# Patient Record
Sex: Female | Born: 1960 | Race: White | Hispanic: No | Smoking: Former smoker
Health system: Southern US, Community
[De-identification: ages and names within clinical notes are randomized; demographics above are authoritative.]

## PROBLEM LIST (undated history)

## (undated) DIAGNOSIS — I313 Pericardial effusion (noninflammatory): Secondary | ICD-10-CM

## (undated) DIAGNOSIS — E0500 Thyrotoxicosis with diffuse goiter without thyrotoxic crisis or storm: Secondary | ICD-10-CM

## (undated) DIAGNOSIS — I3131 Malignant pericardial effusion in diseases classified elsewhere: Secondary | ICD-10-CM

## (undated) DIAGNOSIS — C3490 Malignant neoplasm of unspecified part of unspecified bronchus or lung: Secondary | ICD-10-CM

## (undated) DIAGNOSIS — I480 Paroxysmal atrial fibrillation: Secondary | ICD-10-CM

## (undated) DIAGNOSIS — C801 Malignant (primary) neoplasm, unspecified: Secondary | ICD-10-CM

## (undated) HISTORY — PX: TUBAL LIGATION: SHX77

## (undated) HISTORY — DX: Malignant neoplasm of unspecified part of unspecified bronchus or lung: C34.90

## (undated) HISTORY — DX: Pericardial effusion (noninflammatory): I31.3

## (undated) HISTORY — DX: Malignant (primary) neoplasm, unspecified: C80.1

## (undated) HISTORY — DX: Malignant pericardial effusion in diseases classified elsewhere: I31.31

## (undated) HISTORY — DX: Paroxysmal atrial fibrillation: I48.0

---

## 2013-12-01 ENCOUNTER — Emergency Department: Payer: Self-pay | Admitting: Emergency Medicine

## 2013-12-01 LAB — CBC
HCT: 46.8 % (ref 35.0–47.0)
HGB: 15.9 g/dL (ref 12.0–16.0)
MCH: 29.6 pg (ref 26.0–34.0)
MCHC: 33.9 g/dL (ref 32.0–36.0)
MCV: 87 fL (ref 80–100)
Platelet: 236 10*3/uL (ref 150–440)
RBC: 5.36 10*6/uL — ABNORMAL HIGH (ref 3.80–5.20)
RDW: 14.7 % — ABNORMAL HIGH (ref 11.5–14.5)
WBC: 10.3 10*3/uL (ref 3.6–11.0)

## 2013-12-01 LAB — DRUG SCREEN, URINE
AMPHETAMINES, UR SCREEN: NEGATIVE (ref ?–1000)
BENZODIAZEPINE, UR SCRN: NEGATIVE (ref ?–200)
Barbiturates, Ur Screen: NEGATIVE (ref ?–200)
CANNABINOID 50 NG, UR ~~LOC~~: POSITIVE (ref ?–50)
COCAINE METABOLITE, UR ~~LOC~~: NEGATIVE (ref ?–300)
MDMA (ECSTASY) UR SCREEN: NEGATIVE (ref ?–500)
Methadone, Ur Screen: NEGATIVE (ref ?–300)
Opiate, Ur Screen: NEGATIVE (ref ?–300)
Phencyclidine (PCP) Ur S: NEGATIVE (ref ?–25)
Tricyclic, Ur Screen: NEGATIVE (ref ?–1000)

## 2013-12-01 LAB — URINALYSIS, COMPLETE
Bacteria: NONE SEEN
Bilirubin,UR: NEGATIVE
Blood: NEGATIVE
Glucose,UR: NEGATIVE mg/dL (ref 0–75)
Ketone: NEGATIVE
Nitrite: NEGATIVE
PH: 6 (ref 4.5–8.0)
PROTEIN: NEGATIVE
RBC,UR: NONE SEEN /HPF (ref 0–5)
Specific Gravity: 1.005 (ref 1.003–1.030)
Squamous Epithelial: 1
WBC UR: <1 /HPF (ref 0–5)

## 2013-12-01 LAB — ACETAMINOPHEN LEVEL

## 2013-12-01 LAB — COMPREHENSIVE METABOLIC PANEL
ALT: 36 U/L (ref 12–78)
AST: 35 U/L (ref 15–37)
Albumin: 3.7 g/dL (ref 3.4–5.0)
Alkaline Phosphatase: 327 U/L — ABNORMAL HIGH
Anion Gap: 4 — ABNORMAL LOW (ref 7–16)
BILIRUBIN TOTAL: 0.4 mg/dL (ref 0.2–1.0)
BUN: 8 mg/dL (ref 7–18)
Calcium, Total: 8.8 mg/dL (ref 8.5–10.1)
Chloride: 108 mmol/L — ABNORMAL HIGH (ref 98–107)
Co2: 25 mmol/L (ref 21–32)
Creatinine: 0.43 mg/dL — ABNORMAL LOW (ref 0.60–1.30)
Glucose: 117 mg/dL — ABNORMAL HIGH (ref 65–99)
Osmolality: 273 (ref 275–301)
POTASSIUM: 3.7 mmol/L (ref 3.5–5.1)
Sodium: 137 mmol/L (ref 136–145)
Total Protein: 7.8 g/dL (ref 6.4–8.2)

## 2013-12-01 LAB — ETHANOL: Ethanol: <3 mg/dL

## 2013-12-01 LAB — SALICYLATE LEVEL: SALICYLATES, SERUM: 1.7 mg/dL

## 2013-12-14 ENCOUNTER — Emergency Department: Payer: Self-pay | Admitting: Emergency Medicine

## 2014-03-22 DIAGNOSIS — I10 Essential (primary) hypertension: Secondary | ICD-10-CM | POA: Insufficient documentation
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
## 2014-12-31 NOTE — Consult Note (Signed)
Brief Consult Note: Diagnosis: adjustment disorder with anxiety.   Patient was seen by consultant.   Consult note dictated.   Orders entered.   Discussed with Attending MD.   Comments: Psychiatry: PAtient seen. Chart reviewed. Patient presents with overwhelming anxiety but no suicidal ideation or psychosis. Has medical problems and serious social stress. Not getting outpt treatment.   She has no ride tonight and also is having what sounds like a migraine. Ordered one-time zofran, motrin and restoril to ease discomfort and let her rest. Can be discharged in AM and referred to [facility].  Electronic Signatures: Gonzella Lex (MD)  (Signed 25-Mar-15 21:04)  Authored: Brief Consult Note   Last Updated: 25-Mar-15 21:04 by Gonzella Lex (MD)

## 2017-03-19 ENCOUNTER — Encounter: Payer: Self-pay | Admitting: Intensive Care

## 2017-03-19 ENCOUNTER — Emergency Department: Payer: Self-pay

## 2017-03-19 ENCOUNTER — Emergency Department
Admission: EM | Admit: 2017-03-19 | Discharge: 2017-03-19 | Disposition: A | Discharge status: Home / Self Care | Payer: Self-pay | Attending: Emergency Medicine | Admitting: Emergency Medicine

## 2017-03-19 DIAGNOSIS — A938 Other specified arthropod-borne viral fevers: Secondary | ICD-10-CM | POA: Insufficient documentation

## 2017-03-19 DIAGNOSIS — Z79899 Other long term (current) drug therapy: Secondary | ICD-10-CM | POA: Insufficient documentation

## 2017-03-19 DIAGNOSIS — F17210 Nicotine dependence, cigarettes, uncomplicated: Secondary | ICD-10-CM | POA: Insufficient documentation

## 2017-03-19 HISTORY — DX: Thyrotoxicosis with diffuse goiter without thyrotoxic crisis or storm: E05.00

## 2017-03-19 LAB — COMPREHENSIVE METABOLIC PANEL
ALBUMIN: 3.9 g/dL (ref 3.5–5.0)
ALK PHOS: 84 U/L (ref 38–126)
ALT: 25 U/L (ref 14–54)
AST: 36 U/L (ref 15–41)
Anion gap: 12 (ref 5–15)
BUN: 8 mg/dL (ref 6–20)
CALCIUM: 9.9 mg/dL (ref 8.9–10.3)
CO2: 23 mmol/L (ref 22–32)
Chloride: 104 mmol/L (ref 101–111)
Creatinine, Ser: 0.69 mg/dL (ref 0.44–1.00)
GFR calc Af Amer: >60 mL/min (ref >60)
GFR calc non Af Amer: >60 mL/min (ref >60)
Glucose, Bld: 142 mg/dL — ABNORMAL HIGH (ref 65–99)
Potassium: 3.7 mmol/L (ref 3.5–5.1)
Sodium: 139 mmol/L (ref 135–145)
TOTAL PROTEIN: 8 g/dL (ref 6.5–8.1)
Total Bilirubin: 0.8 mg/dL (ref 0.3–1.2)

## 2017-03-19 LAB — CBC WITH DIFFERENTIAL/PLATELET
BASOS ABS: 0.1 10*3/uL (ref 0–0.1)
Basophils Relative: 1 %
Eosinophils Absolute: 0.2 10*3/uL (ref 0–0.7)
Eosinophils Relative: 2 %
HCT: 45.3 % (ref 35.0–47.0)
HEMOGLOBIN: 15.4 g/dL (ref 12.0–16.0)
Lymphocytes Relative: 23 %
Lymphs Abs: 3.3 10*3/uL (ref 1.0–3.6)
MCH: 31.1 pg (ref 26.0–34.0)
MCHC: 33.9 g/dL (ref 32.0–36.0)
MCV: 91.5 fL (ref 80.0–100.0)
MONOS PCT: 8 %
Monocytes Absolute: 1.2 10*3/uL — ABNORMAL HIGH (ref 0.2–0.9)
NEUTROS PCT: 66 %
Neutro Abs: 9.8 10*3/uL — ABNORMAL HIGH (ref 1.4–6.5)
Platelets: 303 10*3/uL (ref 150–440)
RBC: 4.94 MIL/uL (ref 3.80–5.20)
RDW: 12.9 % (ref 11.5–14.5)
WBC: 14.7 10*3/uL — ABNORMAL HIGH (ref 3.6–11.0)

## 2017-03-19 LAB — URINALYSIS, COMPLETE (UACMP) WITH MICROSCOPIC
BACTERIA UA: NONE SEEN
BILIRUBIN URINE: NEGATIVE
GLUCOSE, UA: NEGATIVE mg/dL
HGB URINE DIPSTICK: NEGATIVE
KETONES UR: NEGATIVE mg/dL
NITRITE: NEGATIVE
PROTEIN: NEGATIVE mg/dL
Specific Gravity, Urine: 1.008 (ref 1.005–1.030)
pH: 6 (ref 5.0–8.0)

## 2017-03-19 LAB — POCT PREGNANCY, URINE: PREG TEST UR: NEGATIVE

## 2017-03-19 MED ORDER — IOPAMIDOL (ISOVUE-300) INJECTION 61%
30.0000 mL | Freq: Once | INTRAVENOUS | Status: AC | PRN
  Administered 2017-03-19: 30 mL via ORAL

## 2017-03-19 MED ORDER — DOXYCYCLINE HYCLATE 100 MG PO TABS
100.0000 mg | ORAL_TABLET | Freq: Once | ORAL | Status: AC
  Administered 2017-03-19: 100 mg via ORAL
  Filled 2017-03-19: qty 1

## 2017-03-19 MED ORDER — IOPAMIDOL (ISOVUE-300) INJECTION 61%
100.0000 mL | Freq: Once | INTRAVENOUS | Status: AC | PRN
  Administered 2017-03-19: 100 mL via INTRAVENOUS

## 2017-03-19 NOTE — ED Provider Notes (Addendum)
[facility] Emergency Department Provider Note   ____________________________________________   First MD Initiated Contact with Patient 03/19/17 1410     (approximate)  I have reviewed the triage vital signs and the nursing notes.   HISTORY  Chief Complaint Tick Removal    HPI SHYE DOTY is a 56 y.o. female who reports she had a tick that she pulled off herself on the right side about a week to week and half ago. She was doing okay but then about 4 days ago began having fevers headaches and muscle aches. Fever up to 102.3 maximum. She has also had left lower quadrant pain. She is having urinary frequency and urgency as well. She does not have a rash. Pain is moderate and achy in nature  Past Medical History:  Diagnosis Date  . Graves disease     There are no active problems to display for this patient.   History reviewed. No pertinent surgical history.  Prior to Admission medications   Medication Sig Start Date End Date Taking? Authorizing Provider  albuterol (PROVENTIL HFA;VENTOLIN HFA) 108 (90 Base) MCG/ACT inhaler Inhale 2 puffs into the lungs every 6 (six) hours as needed.    [provider]  Multiple Vitamin (MULTI-VITAMINS) TABS Take 1 tablet by mouth daily.    [provider]    Allergies Metoprolol and Tapazole [methimazole]  History reviewed. No pertinent family history.  Social History Social History  Substance Use Topics  . Smoking status: Current Every Day Smoker    Packs/day: 1.00    Types: Cigarettes  . Smokeless tobacco: Never Used  . Alcohol use No    Review of Systems  Constitutional:  fever/chills Eyes: No visual changes. ENT: No sore throat. Cardiovascular: Denies chest pain. Respiratory: Denies shortness of breath. Gastrointestinal: No abdominal pain.  No nausea, no vomiting.  No diarrhea.  No constipation. Genitourinary: Negative for dysuria. Musculoskeletal: Negative for back pain. Skin:  Negative for rash. Neurological: Negative for focal weakness or numbness.   ____________________________________________   PHYSICAL EXAM:  VITAL SIGNS: ED Triage Vitals  Enc Vitals Group     BP 03/19/17 1142 135/88     Pulse Rate 03/19/17 1142 (!) 104     Resp 03/19/17 1142 16     Temp 03/19/17 1142 98.1 F (36.7 C)     Temp Source 03/19/17 1142 Oral     SpO2 03/19/17 1142 96 %     Weight 03/19/17 1143 124 lb (56.2 kg)     Height 03/19/17 1143 5' 1.5" (1.562 m)     Head Circumference --      Peak Flow --      Pain Score --      Pain Loc --      Pain Edu? --      Excl. in [place]? --     Constitutional: Alert and oriented. Well appearing and in no acute distress. Eyes: Conjunctivae are normal.  Head: Atraumatic. Nose: No congestion/rhinnorhea. Mouth/Throat: Mucous membranes are moist.  Oropharynx non-erythematous. Neck: No stridor.  Cardiovascular: Normal rate, regular rhythm. Grossly normal heart sounds.  Good peripheral circulation. Respiratory: Normal respiratory effort.  No retractions. Lungs CTAB. Gastrointestinal: Soft and nontender. No distention. No abdominal bruits. No CVA tenderness. Musculoskeletal: No lower extremity tenderness nor edema.  No joint effusions. Neurologic:  Normal speech and language. No gross focal neurologic deficits are appreciated. No gait instability. Skin:  Skin is warm, dry and intact. No rash noted. Psychiatric: Mood and affect are  normal. Speech and behavior are normal.  ____________________________________________   LABS (all labs ordered are listed, but only abnormal results are displayed)  Labs Reviewed  CBC WITH DIFFERENTIAL/PLATELET - Abnormal; Notable for the following:       Result Value   WBC 14.7 (*)    Neutro Abs 9.8 (*)    Monocytes Absolute 1.2 (*)    All other components within normal limits  COMPREHENSIVE METABOLIC PANEL - Abnormal; Notable for the following:    Glucose, Bld 142 (*)    All other components within  normal limits  URINALYSIS, COMPLETE (UACMP) WITH MICROSCOPIC - Abnormal; Notable for the following:    Color, Urine YELLOW (*)    APPearance CLEAR (*)    Leukocytes, UA TRACE (*)    Squamous Epithelial / LPF 0-5 (*)    All other components within normal limits  CULTURE, BLOOD (ROUTINE X 2)  CULTURE, BLOOD (ROUTINE X 2)  URINE CULTURE  PREGNANCY, URINE  POCT PREGNANCY, URINE   ____________________________________________  EKG   ____________________________________________  RADIOLOGY   IMPRESSION: 1. Colonic diverticulosis without diverticulitis. 2. Prominent left adnexal vascularity and dilatation of the ovarian vein, can be seen with pelvic congestion syndrome. 3. Aortic atherosclerosis.   Electronically Signed   By: Jeb Levering M.D.   On: 03/19/2017 16:28  ____________________________________________   PROCEDURES  Procedure(s) performed:  Procedures  Critical Care performed:   ____________________________________________   INITIAL IMPRESSION / ASSESSMENT AND PLAN / ED COURSE  Pertinent labs & imaging results that were available during my care of the patient were reviewed by me and considered in my medical decision making (see chart for details).        ____________________________________________   FINAL CLINICAL IMPRESSION(S) / ED DIAGNOSES  Final diagnoses:  Tick fever      NEW MEDICATIONS STARTED DURING THIS VISIT:  New Prescriptions   No medications on file     Note:  This document was prepared using Dragon voice recognition software and may include unintentional dictation errors.    Nena Polio, MD 03/19/17 1640    Nena Polio, MD 03/19/17 [phone]

## 2017-03-19 NOTE — ED Notes (Signed)
Resumed care from [facility].  Pt drinking po contrast.  Pt  Has abd pain and low back pain.  nsr on monitor.  Pt alert. Iv in place.

## 2017-03-19 NOTE — ED Triage Notes (Signed)
Patient reports finding a tick on her about a week ago. Pt c/o no appetite X3-4 days, night sweats, chills, fevers (reports 102.3 highest fever at home), muscle aches and joint pain. No fever upon arrival. Has had no medicine today. Ambulatory in triage with no problems. NAD noted.

## 2017-03-19 NOTE — Discharge Instructions (Signed)
I think you have a tick fever. Doxycycline one pill twice a day for 10 days should take care of it very well. Please return for worse pain fever more abdominal pain or any other symptoms. Use Tylenol or Motrin for the fever for now.

## 2017-03-19 NOTE — ED Notes (Signed)
Pt states week ago got tick off R hip. States fevers and muscle aches since. States tmax 102.1. Only taking asa but no tylenol or motrin. States abd pain as well- L and R lower "almost in my pelvic area." pt is alert and oriented. Has picture of tick bite on phone. States symptoms x 4 days. "I have peeing every hour, It was pouring out of me before I could get to the bathroom." States urine has been dark.

## 2017-03-21 LAB — URINE CULTURE

## 2017-03-24 LAB — CULTURE, BLOOD (ROUTINE X 2)
CULTURE: NO GROWTH
CULTURE: NO GROWTH
SPECIAL REQUESTS: ADEQUATE
SPECIAL REQUESTS: ADEQUATE

## 2021-03-27 ENCOUNTER — Encounter
Admission: EM | Disposition: A | Discharge status: Home Health | Payer: Self-pay | Source: Home / Self Care | Attending: Internal Medicine

## 2021-03-27 ENCOUNTER — Inpatient Hospital Stay: Payer: Medicaid Other

## 2021-03-27 ENCOUNTER — Other Ambulatory Visit: Payer: Self-pay

## 2021-03-27 ENCOUNTER — Emergency Department: Payer: Medicaid Other

## 2021-03-27 ENCOUNTER — Other Ambulatory Visit: Payer: Self-pay | Admitting: Internal Medicine

## 2021-03-27 ENCOUNTER — Inpatient Hospital Stay
Admission: EM | Admit: 2021-03-27 | Discharge: 2021-04-10 | DRG: 180 | Disposition: A | Discharge status: Home Health | Payer: Medicaid Other | Attending: Internal Medicine | Admitting: Internal Medicine

## 2021-03-27 DIAGNOSIS — R7303 Prediabetes: Secondary | ICD-10-CM | POA: Diagnosis not present

## 2021-03-27 DIAGNOSIS — J9811 Atelectasis: Secondary | ICD-10-CM | POA: Diagnosis present

## 2021-03-27 DIAGNOSIS — Z79899 Other long term (current) drug therapy: Secondary | ICD-10-CM | POA: Diagnosis not present

## 2021-03-27 DIAGNOSIS — Z720 Tobacco use: Secondary | ICD-10-CM

## 2021-03-27 DIAGNOSIS — N390 Urinary tract infection, site not specified: Secondary | ICD-10-CM | POA: Diagnosis not present

## 2021-03-27 DIAGNOSIS — R319 Hematuria, unspecified: Secondary | ICD-10-CM

## 2021-03-27 DIAGNOSIS — C801 Malignant (primary) neoplasm, unspecified: Secondary | ICD-10-CM

## 2021-03-27 DIAGNOSIS — R7989 Other specified abnormal findings of blood chemistry: Secondary | ICD-10-CM

## 2021-03-27 DIAGNOSIS — F17210 Nicotine dependence, cigarettes, uncomplicated: Secondary | ICD-10-CM | POA: Diagnosis present

## 2021-03-27 DIAGNOSIS — I313 Pericardial effusion (noninflammatory): Secondary | ICD-10-CM | POA: Diagnosis present

## 2021-03-27 DIAGNOSIS — J939 Pneumothorax, unspecified: Secondary | ICD-10-CM

## 2021-03-27 DIAGNOSIS — E876 Hypokalemia: Secondary | ICD-10-CM | POA: Diagnosis present

## 2021-03-27 DIAGNOSIS — R0600 Dyspnea, unspecified: Secondary | ICD-10-CM

## 2021-03-27 DIAGNOSIS — C781 Secondary malignant neoplasm of mediastinum: Secondary | ICD-10-CM | POA: Diagnosis present

## 2021-03-27 DIAGNOSIS — Z780 Asymptomatic menopausal state: Secondary | ICD-10-CM

## 2021-03-27 DIAGNOSIS — I959 Hypotension, unspecified: Secondary | ICD-10-CM | POA: Diagnosis present

## 2021-03-27 DIAGNOSIS — R739 Hyperglycemia, unspecified: Secondary | ICD-10-CM | POA: Diagnosis not present

## 2021-03-27 DIAGNOSIS — T380X5A Adverse effect of glucocorticoids and synthetic analogues, initial encounter: Secondary | ICD-10-CM | POA: Diagnosis not present

## 2021-03-27 DIAGNOSIS — D75839 Thrombocytosis, unspecified: Secondary | ICD-10-CM | POA: Diagnosis present

## 2021-03-27 DIAGNOSIS — R0602 Shortness of breath: Secondary | ICD-10-CM

## 2021-03-27 DIAGNOSIS — Z888 Allergy status to other drugs, medicaments and biological substances status: Secondary | ICD-10-CM

## 2021-03-27 DIAGNOSIS — E875 Hyperkalemia: Secondary | ICD-10-CM | POA: Diagnosis not present

## 2021-03-27 DIAGNOSIS — D72829 Elevated white blood cell count, unspecified: Secondary | ICD-10-CM | POA: Diagnosis present

## 2021-03-27 DIAGNOSIS — C3411 Malignant neoplasm of upper lobe, right bronchus or lung: Principal | ICD-10-CM | POA: Diagnosis present

## 2021-03-27 DIAGNOSIS — J439 Emphysema, unspecified: Secondary | ICD-10-CM | POA: Diagnosis present

## 2021-03-27 DIAGNOSIS — Z515 Encounter for palliative care: Secondary | ICD-10-CM

## 2021-03-27 DIAGNOSIS — R591 Generalized enlarged lymph nodes: Secondary | ICD-10-CM | POA: Diagnosis present

## 2021-03-27 DIAGNOSIS — I110 Hypertensive heart disease with heart failure: Secondary | ICD-10-CM | POA: Diagnosis not present

## 2021-03-27 DIAGNOSIS — J9601 Acute respiratory failure with hypoxia: Secondary | ICD-10-CM | POA: Diagnosis not present

## 2021-03-27 DIAGNOSIS — I480 Paroxysmal atrial fibrillation: Secondary | ICD-10-CM | POA: Diagnosis present

## 2021-03-27 DIAGNOSIS — R000 Tachycardia, unspecified: Secondary | ICD-10-CM

## 2021-03-27 DIAGNOSIS — I5031 Acute diastolic (congestive) heart failure: Secondary | ICD-10-CM | POA: Diagnosis not present

## 2021-03-27 DIAGNOSIS — B9620 Unspecified Escherichia coli [E. coli] as the cause of diseases classified elsewhere: Secondary | ICD-10-CM | POA: Diagnosis not present

## 2021-03-27 DIAGNOSIS — J449 Chronic obstructive pulmonary disease, unspecified: Secondary | ICD-10-CM

## 2021-03-27 DIAGNOSIS — Z20822 Contact with and (suspected) exposure to covid-19: Secondary | ICD-10-CM | POA: Diagnosis present

## 2021-03-27 DIAGNOSIS — I314 Cardiac tamponade: Secondary | ICD-10-CM | POA: Diagnosis present

## 2021-03-27 DIAGNOSIS — J9690 Respiratory failure, unspecified, unspecified whether with hypoxia or hypercapnia: Secondary | ICD-10-CM

## 2021-03-27 DIAGNOSIS — A419 Sepsis, unspecified organism: Secondary | ICD-10-CM

## 2021-03-27 DIAGNOSIS — R918 Other nonspecific abnormal finding of lung field: Secondary | ICD-10-CM

## 2021-03-27 DIAGNOSIS — I3131 Malignant pericardial effusion in diseases classified elsewhere: Secondary | ICD-10-CM

## 2021-03-27 DIAGNOSIS — R531 Weakness: Secondary | ICD-10-CM

## 2021-03-27 DIAGNOSIS — Z8639 Personal history of other endocrine, nutritional and metabolic disease: Secondary | ICD-10-CM

## 2021-03-27 DIAGNOSIS — Z716 Tobacco abuse counseling: Secondary | ICD-10-CM

## 2021-03-27 DIAGNOSIS — I3139 Other pericardial effusion (noninflammatory): Secondary | ICD-10-CM

## 2021-03-27 HISTORY — PX: PERICARDIOCENTESIS: CATH118255

## 2021-03-27 LAB — CBC
HCT: 45.4 % (ref 36.0–46.0)
Hemoglobin: 15.7 g/dL — ABNORMAL HIGH (ref 12.0–15.0)
MCH: 30.4 pg (ref 26.0–34.0)
MCHC: 34.6 g/dL (ref 30.0–36.0)
MCV: 88 fL (ref 80.0–100.0)
Platelets: 467 10*3/uL — ABNORMAL HIGH (ref 150–400)
RBC: 5.16 MIL/uL — ABNORMAL HIGH (ref 3.87–5.11)
RDW: 12.7 % (ref 11.5–15.5)
WBC: 21.7 10*3/uL — ABNORMAL HIGH (ref 4.0–10.5)
nRBC: 0 % (ref 0.0–0.2)

## 2021-03-27 LAB — CBC WITH DIFFERENTIAL/PLATELET
Abs Immature Granulocytes: 0.16 10*3/uL — ABNORMAL HIGH (ref 0.00–0.07)
Basophils Absolute: 0.1 10*3/uL (ref 0.0–0.1)
Basophils Relative: 0 %
Eosinophils Absolute: 0 10*3/uL (ref 0.0–0.5)
Eosinophils Relative: 0 %
HCT: 46.6 % — ABNORMAL HIGH (ref 36.0–46.0)
Hemoglobin: 15.6 g/dL — ABNORMAL HIGH (ref 12.0–15.0)
Immature Granulocytes: 1 %
Lymphocytes Relative: 18 %
Lymphs Abs: 3.8 10*3/uL (ref 0.7–4.0)
MCH: 29.6 pg (ref 26.0–34.0)
MCHC: 33.5 g/dL (ref 30.0–36.0)
MCV: 88.4 fL (ref 80.0–100.0)
Monocytes Absolute: 2 10*3/uL — ABNORMAL HIGH (ref 0.1–1.0)
Monocytes Relative: 9 %
Neutro Abs: 15.1 10*3/uL — ABNORMAL HIGH (ref 1.7–7.7)
Neutrophils Relative %: 72 %
Platelets: 490 10*3/uL — ABNORMAL HIGH (ref 150–400)
RBC: 5.27 MIL/uL — ABNORMAL HIGH (ref 3.87–5.11)
RDW: 12.8 % (ref 11.5–15.5)
WBC: 21.1 10*3/uL — ABNORMAL HIGH (ref 4.0–10.5)
nRBC: 0 % (ref 0.0–0.2)

## 2021-03-27 LAB — BODY FLUID CELL COUNT WITH DIFFERENTIAL
Eos, Fluid: 0 %
Lymphs, Fluid: 43 %
Monocyte-Macrophage-Serous Fluid: 10 % — ABNORMAL LOW (ref 50–90)
Neutrophil Count, Fluid: 47 % — ABNORMAL HIGH (ref 0–25)
Total Nucleated Cell Count, Fluid: 2102 cu mm — ABNORMAL HIGH (ref 0–1000)

## 2021-03-27 LAB — URINALYSIS, COMPLETE (UACMP) WITH MICROSCOPIC
Glucose, UA: 50 mg/dL — AB
Hgb urine dipstick: NEGATIVE
Ketones, ur: NEGATIVE mg/dL
Nitrite: NEGATIVE
Protein, ur: 30 mg/dL — AB
Specific Gravity, Urine: 1.028 (ref 1.005–1.030)
Squamous Epithelial / LPF: >50 — ABNORMAL HIGH (ref 0–5)
WBC, UA: >50 WBC/hpf — ABNORMAL HIGH (ref 0–5)
pH: 5 (ref 5.0–8.0)

## 2021-03-27 LAB — BASIC METABOLIC PANEL
Anion gap: 13 (ref 5–15)
BUN: 34 mg/dL — ABNORMAL HIGH (ref 6–20)
CO2: 20 mmol/L — ABNORMAL LOW (ref 22–32)
Calcium: 9.4 mg/dL (ref 8.9–10.3)
Chloride: 98 mmol/L (ref 98–111)
Creatinine, Ser: 0.81 mg/dL (ref 0.44–1.00)
GFR, Estimated: >60 mL/min (ref >60)
Glucose, Bld: 144 mg/dL — ABNORMAL HIGH (ref 70–99)
Potassium: 4.5 mmol/L (ref 3.5–5.1)
Sodium: 131 mmol/L — ABNORMAL LOW (ref 135–145)

## 2021-03-27 LAB — RESP PANEL BY RT-PCR (FLU A&B, COVID) ARPGX2
Influenza A by PCR: NEGATIVE
Influenza B by PCR: NEGATIVE
SARS Coronavirus 2 by RT PCR: NEGATIVE

## 2021-03-27 LAB — HEPATIC FUNCTION PANEL
ALT: 21 U/L (ref 0–44)
AST: 31 U/L (ref 15–41)
Albumin: 3.2 g/dL — ABNORMAL LOW (ref 3.5–5.0)
Alkaline Phosphatase: 74 U/L (ref 38–126)
Bilirubin, Direct: 0.4 mg/dL — ABNORMAL HIGH (ref 0.0–0.2)
Indirect Bilirubin: 0.8 mg/dL (ref 0.3–0.9)
Total Bilirubin: 1.2 mg/dL (ref 0.3–1.2)
Total Protein: 7.7 g/dL (ref 6.5–8.1)

## 2021-03-27 LAB — LACTIC ACID, PLASMA
Lactic Acid, Venous: 5.3 mmol/L (ref 0.5–1.9)
Lactic Acid, Venous: 3.9 mmol/L (ref 0.5–1.9)
Lactic Acid, Venous: 2.5 mmol/L (ref 0.5–1.9)

## 2021-03-27 LAB — CK: Total CK: 15 U/L — ABNORMAL LOW (ref 38–234)

## 2021-03-27 LAB — PROTIME-INR
INR: 1.1 (ref 0.8–1.2)
Prothrombin Time: 14.1 seconds (ref 11.4–15.2)

## 2021-03-27 LAB — TSH
TSH: 8.15 u[IU]/mL — ABNORMAL HIGH (ref 0.350–4.500)
TSH: 3.233 u[IU]/mL (ref 0.350–4.500)

## 2021-03-27 LAB — PROCALCITONIN: Procalcitonin: <0.1 ng/mL

## 2021-03-27 LAB — APTT: aPTT: 28 seconds (ref 24–36)

## 2021-03-27 LAB — PREGNANCY, URINE: Preg Test, Ur: POSITIVE — AB

## 2021-03-27 LAB — HCG, QUANTITATIVE, PREGNANCY: hCG, Beta Chain, Quant, S: 64 m[IU]/mL — ABNORMAL HIGH (ref <5)

## 2021-03-27 SURGERY — PERICARDIOCENTESIS
Anesthesia: Moderate Sedation

## 2021-03-27 MED ORDER — FENTANYL CITRATE (PF) 100 MCG/2ML IJ SOLN
INTRAMUSCULAR | Status: DC | PRN
  Administered 2021-03-27: 12.5 ug via INTRAVENOUS

## 2021-03-27 MED ORDER — LACTATED RINGERS IV BOLUS
1000.0000 mL | Freq: Once | INTRAVENOUS | Status: AC
  Administered 2021-03-27: 1000 mL via INTRAVENOUS

## 2021-03-27 MED ORDER — ACETAMINOPHEN 325 MG PO TABS
650.0000 mg | ORAL_TABLET | Freq: Four times a day (QID) | ORAL | Status: DC | PRN
  Administered 2021-03-27 – 2021-04-02 (×6): 650 mg via ORAL
  Filled 2021-03-27 (×7): qty 2

## 2021-03-27 MED ORDER — MIDAZOLAM HCL 2 MG/2ML IJ SOLN
INTRAMUSCULAR | Status: DC | PRN
  Administered 2021-03-27: 0.5 mg via INTRAVENOUS

## 2021-03-27 MED ORDER — DOCUSATE SODIUM 100 MG PO CAPS
100.0000 mg | ORAL_CAPSULE | Freq: Two times a day (BID) | ORAL | Status: DC | PRN
  Administered 2021-03-29: 100 mg via ORAL
  Filled 2021-03-27 (×2): qty 1

## 2021-03-27 MED ORDER — FENTANYL CITRATE (PF) 100 MCG/2ML IJ SOLN
INTRAMUSCULAR | Status: AC
  Filled 2021-03-27: qty 2

## 2021-03-27 MED ORDER — SODIUM CHLORIDE 0.9 % IV BOLUS
1000.0000 mL | Freq: Once | INTRAVENOUS | Status: AC
  Administered 2021-03-27: 1000 mL via INTRAVENOUS

## 2021-03-27 MED ORDER — CHLORHEXIDINE GLUCONATE CLOTH 2 % EX PADS
6.0000 | MEDICATED_PAD | Freq: Every day | CUTANEOUS | Status: DC
  Administered 2021-03-28 – 2021-04-08 (×4): 6 via TOPICAL

## 2021-03-27 MED ORDER — POLYETHYLENE GLYCOL 3350 17 G PO PACK
17.0000 g | PACK | Freq: Every day | ORAL | Status: DC | PRN
  Filled 2021-03-27: qty 1

## 2021-03-27 MED ORDER — SODIUM CHLORIDE 0.9 % IV SOLN: 2.0000 g | INTRAVENOUS | Status: DC

## 2021-03-27 MED ORDER — SODIUM CHLORIDE 0.9 % IV SOLN
1.0000 g | Freq: Once | INTRAVENOUS | Status: AC
  Administered 2021-03-27: 1 g via INTRAVENOUS
  Filled 2021-03-27: qty 1

## 2021-03-27 MED ORDER — HEPARIN (PORCINE) IN NACL 1000-0.9 UT/500ML-% IV SOLN
INTRAVENOUS | Status: DC | PRN
Start: 2021-03-27 — End: 2021-03-27
  Administered 2021-03-27: 500 mL

## 2021-03-27 MED ORDER — IOHEXOL 350 MG/ML SOLN
100.0000 mL | Freq: Once | INTRAVENOUS | Status: AC | PRN
  Administered 2021-03-27: 100 mL via INTRAVENOUS

## 2021-03-27 MED ORDER — MIDAZOLAM HCL 2 MG/2ML IJ SOLN
INTRAMUSCULAR | Status: AC
  Filled 2021-03-27: qty 2

## 2021-03-27 MED ORDER — ONDANSETRON HCL 4 MG/2ML IJ SOLN: 4.0000 mg | Freq: Once | INTRAMUSCULAR | Status: DC

## 2021-03-27 SURGICAL SUPPLY — 14 items
BAG DRN 30 TUBE KINK RST TY (MISCELLANEOUS) ×1
BAG NEPHROSTOMY DRAIN 600ML (MISCELLANEOUS) ×2 IMPLANT
CANNULA 5F STIFF (CANNULA) IMPLANT
DRAPE BRACHIAL (DRAPES) ×2 IMPLANT
GLIDESHEATH SLEND SS 6F .021 (SHEATH) IMPLANT
GUIDEWIRE INQWIRE 1.5J.035X260 (WIRE) IMPLANT
INQWIRE 1.5J .035X260CM (WIRE)
KIT ENCORE 26 ADVANTAGE (KITS) IMPLANT
KIT SYRINGE INJ CVI SPIKEX1 (MISCELLANEOUS) ×2 IMPLANT
PACK CARDIAC CATH (CUSTOM PROCEDURE TRAY) ×2 IMPLANT
PROTECTION STATION PRESSURIZED (MISCELLANEOUS)
SET ATX SIMPLICITY (MISCELLANEOUS) IMPLANT
STATION PROTECTION PRESSURIZED (MISCELLANEOUS) IMPLANT
TRAY PERICARDIOCENTESIS 6FX60 (TRAY / TRAY PROCEDURE) ×2 IMPLANT

## 2021-03-27 NOTE — H&P (View-Only) (Signed)
Cardiology Consultation:   Patient ID: Nichole Park MRN: [id number]; DOB: 1960/12/27  Admit date: 03/27/2021 Date of Consult: 03/27/2021  PCP:  Merryl Hacker, No   [facility] Providers Cardiologist:  New - Evamarie Raetz   Patient Profile:   Nichole Park is a 60 y.o. female with a hx of Graves disease who is being seen 03/27/2021 for the evaluation of pericardial effusion at the request of Dr. Cinda Quest.  History of Present Illness:   Nichole Park reports a 3-week history of worsening shortness of breath, generalized malaise and weakness as well as intermittent bilateral lower chest pain.  Symptoms have gradually worsened to the point that she gets out of breath just walking to the bathroom.  Her chest pain is not associated with activity but seems to worsen when she takes a deep breath.  Her progressive symptoms her to go to [facility] today.  There she was noted to be borderline hypotensive with associated lightheadedness.  She was referred to the ED for further evaluation.  In the emergency department, Nichole Park was noted to have soft blood pressure with elevated heart rates.  EKG showed sinus tachycardia.  CTA of the chest to exclude PE was negative for PE but demonstrated a spiculated right upper lobe mass with concern for lymphangitic and metastatic disease.  A large pericardial effusion was also identified.  Initial labs were notable for a white blood cell count of 22,000, lactate of 5.3 and positive urine pregnancy test confirmed with serum beta hCG.  Of note, the patient reports that she has been through menopause and is also status post bilateral tubal ligation.  Currently, she continues to feel short of breath.  She also feels like her abdomen is bloated.  Over the last year, she has also experienced some numbness along her shins.  Her only medication at home is aspirin.  She was previously on metoprolol and some other thyroid medicine, though both were stopped as she developed a rash.  In  the ED, she received a 1 L normal saline bolus as well as 1 g of cefepime.   Past Medical History:  Diagnosis Date   Graves disease     Past Surgical History:  Procedure Laterality Date   TUBAL LIGATION       Home Medications:  Prior to Admission medications   Medication Sig Start Date Jionni Helming Date Taking? Authorizing Provider  albuterol (PROVENTIL HFA;VENTOLIN HFA) 108 (90 Base) MCG/ACT inhaler Inhale 2 puffs into the lungs every 6 (six) hours as needed.    [provider]  Multiple Vitamin (MULTI-VITAMINS) TABS Take 1 tablet by mouth daily.    [provider]    Inpatient Medications: Scheduled Meds:  [MAR Hold] ondansetron (ZOFRAN) IV  4 mg Intravenous Once   Continuous Infusions:  PRN Meds:   Allergies:    Allergies  Allergen Reactions   Metoprolol    Tapazole [Methimazole]     Social History:   Social History   Socioeconomic History   Marital status: Widowed    Spouse name: Not on file   Number of children: Not on file   Years of education: Not on file   Highest education level: Not on file  Occupational History   Not on file  Tobacco Use   Smoking status: Every Day    Packs/day: 1.00    Years: 40.00    Pack years: 40.00    Types: Cigarettes   Smokeless tobacco: Never  Substance and Sexual Activity  Alcohol use: No   Drug use: Yes    Types: Marijuana   Sexual activity: Not on file  Other Topics Concern   Not on file  Social History Narrative   Not on file   Social Determinants of Health   Financial Resource Strain: Not on file  Food Insecurity: Not on file  Transportation Needs: Not on file  Physical Activity: Not on file  Stress: Not on file  Social Connections: Not on file  Intimate Partner Violence: Not on file    Family History:   Family History  Problem Relation Age of Onset   Peripheral Artery Disease Mother      ROS:  Please see the history of present illness. All other ROS reviewed and negative.      Physical Exam/Data:   Vitals:   03/27/21 1845 03/27/21 1900 03/27/21 1918 03/27/21 1930  BP:   120/83 108/89  Pulse: (!) 117 (!) 123 (!) 119 (!) 115  Resp: 19 (!) 24 (!) 32 (!) 26  Temp:      SpO2: 93% 94% 97% 93%   No intake or output data in the 24 hours ending 03/27/21 2037 Last 3 Weights 03/19/2017  Weight (lbs) 124 lb  Weight (kg) 56.246 kg     Pulses paradoxus: 20-30 mmHg (difficult to assess due to tachypnea)  There is no height or weight on file to calculate BMI.  General: Chronically ill-appearing woman lying in bed.  Her daughter is at the bedside. HEENT: normal Lymph: no adenopathy Neck: Difficult to assess JVP due to tachypnea with accessory muscle use. Endocrine:  No thryomegaly Vascular: No carotid bruits; FA pulses 2+ bilaterally without bruits  Cardiac: Tachycardic but regular with distant heart sounds.  No murmurs, rubs, or gallops. Lungs: Mildly diminished breath sounds throughout with tachypnea and intermittent accessory muscle use Abd: soft, nontender and mildly distended. Ext: no edema Musculoskeletal:  No deformities, BUE and BLE strength normal and equal Skin: warm and dry  Neuro:  CNs 2-12 intact, no focal abnormalities noted Psych:  Normal affect   EKG:  The EKG was personally reviewed and demonstrates: Sinus tachycardia with low voltage. Telemetry:  Telemetry was personally reviewed and demonstrates: Sinus tachycardia  Relevant CV Studies: Bedside echocardiogram shows vigorous LV and RV contraction with apparent underfilling.  There is a large circumferential pericardial effusion measuring 2 cm in the subcostal view.  IVC is dilated with decreased respiratory variation.  There is early diastolic collapse of the right ventricle as well as diastolic invagination of the right atrium.  Laboratory Data:  High Sensitivity Troponin:  No results for input(s): TROPONINIHS in the last 720 hours.   Chemistry Recent Labs  Lab 03/27/21 1502  NA 131*  K  4.5  CL 98  CO2 20*  GLUCOSE 144*  BUN 34*  CREATININE 0.81  CALCIUM 9.4  GFRNONAA >60  ANIONGAP 13    Recent Labs  Lab 03/27/21 1635  PROT 7.7  ALBUMIN 3.2*  AST 31  ALT 21  ALKPHOS 74  BILITOT 1.2   Hematology Recent Labs  Lab 03/27/21 1502  WBC 21.1*  21.7*  RBC 5.27*  5.16*  HGB 15.6*  15.7*  HCT 46.6*  45.4  MCV 88.4  88.0  MCH 29.6  30.4  MCHC 33.5  34.6  RDW 12.8  12.7  PLT 490*  467*   BNPNo results for input(s): BNP, PROBNP in the last 168 hours.  DDimer No results for input(s): DDIMER in the last 168 hours.  Radiology/Studies:  DG Chest 1 View  Result Date: 03/27/2021 CLINICAL DATA:  Shortness of breath EXAM: CHEST  1 VIEW COMPARISON:  None. FINDINGS: Cardiomegaly. 4.8 cm mass projects within the right upper lobe. Mildly hyperexpanded lungs with coarsened interstitial markings bilaterally. No pleural effusion or pneumothorax. No acute bony findings. IMPRESSION: 1. 4.8 cm mass within the right upper lobe. CT chest with contrast is recommended for further evaluation. 2. Cardiomegaly. Electronically Signed   By: Davina Poke D.O.   On: 03/27/2021 15:52   CT Angio Chest PE W and/or Wo Contrast  Result Date: 03/27/2021 CLINICAL DATA:  Tachycardia, short of breath, pleuritic chest pain, abnormal chest x-ray EXAM: CT ANGIOGRAPHY CHEST WITH CONTRAST TECHNIQUE: Multidetector CT imaging of the chest was performed using the standard protocol during bolus administration of intravenous contrast. Multiplanar CT image reconstructions and MIPs were obtained to evaluate the vascular anatomy. CONTRAST:  135mL OMNIPAQUE IOHEXOL 350 MG/ML SOLN COMPARISON:  03/27/2021 FINDINGS: Cardiovascular: This is a technically adequate evaluation of the pulmonary vasculature. No filling defects or pulmonary emboli. There is a large pericardial effusion measuring up to 2.3 cm in thickness. Uniform decreased attenuation suggest transudative effusion. Normal caliber of the thoracic  aorta. Mild atherosclerosis of the aorta and coronary vasculature. Mediastinum/Nodes: Pathologically enlarged lymph nodes are seen within the mediastinum, measuring up to 13 mm in short axis reference image 24/4. Adenopathy is also seen within the right supraclavicular region and left axilla. Thyroid, trachea, and esophagus are grossly unremarkable. Lungs/Pleura: There is a large spiculated right upper lobe mass corresponding to the chest x-ray finding, measuring 4.7 x 3.8 by 4.0 cm. Diffuse interlobular septal thickening throughout the right lung concerning for lymphangitic spread of disease. There is background emphysema. No effusion or pneumothorax. Central airways are patent. Upper Abdomen: No acute abnormality. Musculoskeletal: No acute or destructive bony lesions. Reconstructed images demonstrate no additional findings. Review of the MIP images confirms the above findings. IMPRESSION: 1. 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 Diffuse interlobular septal thickening throughout the right lung concerning for lymphangitic spread of disease. 2. Large pericardial effusion. 3. Lymphadenopathy within the mediastinum, right supraclavicular region, and left axilla, concerning for metastatic disease. 4. No evidence of pulmonary embolus. 5. Aortic Atherosclerosis (ICD10-I70.0) and Emphysema (ICD10-J43.9). Electronically Signed   By: Randa Ngo M.D.   On: 03/27/2021 18:24   US Renal  Result Date: 03/27/2021 CLINICAL DATA:  UTI EXAM: RENAL / URINARY TRACT ULTRASOUND COMPLETE COMPARISON:  CT 03/27/2021 FINDINGS: Right Kidney: Renal measurements: 10.9 x 4 x 4.4 cm = volume: 101 mL. Echogenicity within normal limits. No mass or hydronephrosis visualized. Left Kidney: Renal measurements: 11.1 x 4.9 x 5.8 cm = volume: 165 mL. Echogenicity within normal limits. No mass or hydronephrosis visualized. Bladder: Appears normal for degree of bladder distention. Other: None. IMPRESSION: Negative renal  ultrasound Electronically Signed   By: Donavan Foil M.D.   On: 03/27/2021 19:37     Assessment and Plan:   Pericardial effusion and cardiac tamponade: Patient presents with 3 weeks of worsening shortness of breath and is found to have right upper lobe lung mass and large pericardial effusion.  Patient also has notable leukocytosis with elevated lactate which could indicate a degree of sepsis contributing to her symptoms.  Echo I was personally performed at the bedside suggest tamponade physiology.  Pulses paradoxus was also abnormal at 20-30 mmHg.  Given her significant dyspnea, borderline hypotension, and echo findings, we have discussed urgent pericardiocentesis and pericardial drain placement.  After reviewing the risks and benefits of the procedure, Ms. Po has agreed to proceed.  Further recommendations to follow procedure.  Leukocytosis and possible sepsis: Patient has received cefepime.  She reports low-grade fevers at home over the last couple days.  Further management per primary team following completion of pericardiocentesis.  We will send pericardial fluid for routine analyses including cell count and gram stain.  Elevated beta hCG: Patient reports having completed menopause and is also status post bilateral tubal ligation.  I am concerned that her beta-hCG may be related to suspected malignancy.  Given that radiation may be needed during pericardiocentesis, we have discussed potential risks to an unborn child, which Ms. Terada voices understanding of and is agreeable to proceeding.  For questions or updates, please contact [place] Please consult [email or web address] for contact info under [facility].   Signed, Nelva Bush, MD  03/27/2021 8:37 PM

## 2021-03-27 NOTE — ED Notes (Signed)
Patient put on 2L oxygen; pt rose up from bed stating she is short of breath and cannot catch her breath.

## 2021-03-27 NOTE — Progress Notes (Signed)
PHARMACY CONSULT NOTE - FOLLOW UP  Pharmacy Consult for Electrolyte Monitoring and Replacement   Recent Labs: Potassium (mmol/L)  Date Value  03/27/2021 4.5  12/01/2013 3.7   Calcium (mg/dL)  Date Value  03/27/2021 9.4   Calcium, Total (mg/dL)  Date Value  12/01/2013 8.8   Albumin (g/dL)  Date Value  03/27/2021 3.2 (L)  12/01/2013 3.7   Sodium (mmol/L)  Date Value  03/27/2021 131 (L)  12/01/2013 137     Assessment: 60 y.o. female with a hx of Graves disease who reports a 3-week history of worsening shortness of breath, generalized malaise and weakness as well as intermittent bilateral lower chest pain. Pharmacy has been consulted for electrolyte management.  Goal of Therapy:  Electrolytes WNL  Plan:  Na 131, likely to trend up tomorrow after bolus with LR and NS; will hold off on replacement for now Recheck electrolytes with AM labs  Sherilyn Banker ,PharmD Clinical Pharmacist 03/27/2021 9:50 PM

## 2021-03-27 NOTE — ED Notes (Signed)
Cardiologist at bedside - stated he would attempt pulsus paradoxis

## 2021-03-27 NOTE — ED Provider Notes (Signed)
[facility] Emergency Department Provider Note   ____________________________________________   Event Date/Time   First MD Initiated Contact with Patient 03/27/21 1633     (approximate)  I have reviewed the triage vital signs and the nursing notes.   HISTORY  Chief Complaint Shortness of Breath and Weakness    HPI Nichole Park is a 60 y.o. female who complains of feeling ill weak and not herself.  This started about 3 or 4 weeks ago.  She is also short of breath.  Shortness of breath started just a few days ago.  She is coughing up some small amounts of clear phlegm.  She has dark-colored urine.  She has a little bit of lower abdominal pain.  She has occasional times when she has some pain with deep breathing but is not having any pleuritic pain now.     Past Medical History:  Diagnosis Date   Graves disease     There are no problems to display for this patient.   History reviewed. No pertinent surgical history.  Prior to Admission medications   Medication Sig Start Date End Date Taking? Authorizing Provider  albuterol (PROVENTIL HFA;VENTOLIN HFA) 108 (90 Base) MCG/ACT inhaler Inhale 2 puffs into the lungs every 6 (six) hours as needed.    [provider]  Multiple Vitamin (MULTI-VITAMINS) TABS Take 1 tablet by mouth daily.    [provider]    Allergies Metoprolol and Tapazole [methimazole]  No family history on file.  Social History Social History   Tobacco Use   Smoking status: Every Day    Packs/day: 1.00    Types: Cigarettes   Smokeless tobacco: Never  Substance Use Topics   Alcohol use: No   Drug use: Yes    Types: Marijuana    Review of Systems  Constitutional: Low-grade fever no chills Eyes: No visual changes. ENT: No sore throat. Cardiovascular: Occasional chest pain. Respiratory: shortness of breath. Gastrointestinal: Occasional abdominal pain.  No nausea, no vomiting.  No diarrhea.  No  constipation. Genitourinary: Negative for dysuria.  Urine is very dark however Musculoskeletal: Negative for back pain. Skin: Negative for rash. Neurological: Negative for headaches, focal weakness  ____________________________________________   PHYSICAL EXAM:  VITAL SIGNS: ED Triage Vitals  Enc Vitals Group     BP 03/27/21 1458 102/80     Pulse Rate 03/27/21 1458 (!) 120     Resp 03/27/21 1458 18     Temp 03/27/21 1458 98 F (36.7 C)     Temp src --      SpO2 03/27/21 1458 96 %     Weight --      Height --      Head Circumference --      Peak Flow --      Pain Score 03/27/21 1459 0     Pain Loc --      Pain Edu? --      Excl. in [place]? --     Constitutional: Alert and oriented. Well appearing and in no acute distress. Eyes: Conjunctivae are normal. PER EOMI. Head: Atraumatic. Nose: No congestion/rhinnorhea. Mouth/Throat: Mucous membranes are moist.  Oropharynx non-erythematous. Neck: No stridor.   Cardiovascular: Rapid rate, regular rhythm. Grossly normal heart sounds.  Good peripheral circulation. Respiratory: Normal respiratory effort.  No retractions. Lungs CTAB. Gastrointestinal: Soft and nontender. No distention. No abdominal bruits.  Musculoskeletal: No lower extremity tenderness nor edema.  Neurologic:  Normal speech and language. No gross focal neurologic deficits are  appreciated. Skin:  Skin is warm, dry and intact. No rash noted.  ____________________________________________   LABS (all labs ordered are listed, but only abnormal results are displayed)  Labs Reviewed  BASIC METABOLIC PANEL - Abnormal; Notable for the following components:      Result Value   Sodium 131 (*)    CO2 20 (*)    Glucose, Bld 144 (*)    BUN 34 (*)    All other components within normal limits  CBC - Abnormal; Notable for the following components:   WBC 21.7 (*)    RBC 5.16 (*)    Hemoglobin 15.7 (*)    Platelets 467 (*)    All other components within normal limits   URINALYSIS, COMPLETE (UACMP) WITH MICROSCOPIC - Abnormal; Notable for the following components:   Color, Urine AMBER (*)    APPearance CLOUDY (*)    Glucose, UA 50 (*)    Bilirubin Urine SMALL (*)    Protein, ur 30 (*)    Leukocytes,Ua MODERATE (*)    WBC, UA >50 (*)    Bacteria, UA MANY (*)    Squamous Epithelial / LPF >50 (*)    All other components within normal limits  LACTIC ACID, PLASMA - Abnormal; Notable for the following components:   Lactic Acid, Venous 5.3 (*)    All other components within normal limits  LACTIC ACID, PLASMA - Abnormal; Notable for the following components:   Lactic Acid, Venous 3.9 (*)    All other components within normal limits  PREGNANCY, URINE - Abnormal; Notable for the following components:   Preg Test, Ur POSITIVE (*)    All other components within normal limits  HEPATIC FUNCTION PANEL - Abnormal; Notable for the following components:   Albumin 3.2 (*)    Bilirubin, Direct 0.4 (*)    All other components within normal limits  CK - Abnormal; Notable for the following components:   Total CK 15 (*)    All other components within normal limits  TSH - Abnormal; Notable for the following components:   TSH 8.150 (*)    All other components within normal limits  CBC WITH DIFFERENTIAL/PLATELET - Abnormal; Notable for the following components:   WBC 21.1 (*)    RBC 5.27 (*)    Hemoglobin 15.6 (*)    HCT 46.6 (*)    Platelets 490 (*)    Neutro Abs 15.1 (*)    Monocytes Absolute 2.0 (*)    Abs Immature Granulocytes 0.16 (*)    All other components within normal limits  RESP PANEL BY RT-PCR (FLU A&B, COVID) ARPGX2  CULTURE, BLOOD (SINGLE)  CULTURE, BLOOD (SINGLE)  URINE CULTURE  PROTIME-INR  APTT  PROCALCITONIN  HCG, QUANTITATIVE, PREGNANCY  CBG MONITORING, ED   ____________________________________________  EKG EKG read interpreted by me shows sinus tachycardia rate of 123 normal axis no acute ST-T wave  changes  ____________________________________________  RADIOLOGY Gertha Calkin, personally viewed and evaluated these images (plain radiographs) as part of my medical decision making, as well as reviewing the written report by the radiologist.  ED MD interpretation: Chest x-ray read by radiology and reviewed by me shows a 1.5 cm mass in the right upper lobe. CT read by radiology reviewed by me shows a large mass in the right upper lobe which looks cancerous.  Additionally there is a large pericardial effusion. Official radiology report(s): DG Chest 1 View  Result Date: 03/27/2021 CLINICAL DATA:  Shortness of breath EXAM: CHEST  1 VIEW COMPARISON:  None. FINDINGS: Cardiomegaly. 4.8 cm mass projects within the right upper lobe. Mildly hyperexpanded lungs with coarsened interstitial markings bilaterally. No pleural effusion or pneumothorax. No acute bony findings. IMPRESSION: 1. 4.8 cm mass within the right upper lobe. CT chest with contrast is recommended for further evaluation. 2. Cardiomegaly. Electronically Signed   By: Davina Poke D.O.   On: 03/27/2021 15:52   CT Angio Chest PE W and/or Wo Contrast  Result Date: 03/27/2021 CLINICAL DATA:  Tachycardia, short of breath, pleuritic chest pain, abnormal chest x-ray EXAM: CT ANGIOGRAPHY CHEST WITH CONTRAST TECHNIQUE: Multidetector CT imaging of the chest was performed using the standard protocol during bolus administration of intravenous contrast. Multiplanar CT image reconstructions and MIPs were obtained to evaluate the vascular anatomy. CONTRAST:  131mL OMNIPAQUE IOHEXOL 350 MG/ML SOLN COMPARISON:  03/27/2021 FINDINGS: Cardiovascular: This is a technically adequate evaluation of the pulmonary vasculature. No filling defects or pulmonary emboli. There is a large pericardial effusion measuring up to 2.3 cm in thickness. Uniform decreased attenuation suggest transudative effusion. Normal caliber of the thoracic aorta. Mild atherosclerosis of  the aorta and coronary vasculature. Mediastinum/Nodes: Pathologically enlarged lymph nodes are seen within the mediastinum, measuring up to 13 mm in short axis reference image 24/4. Adenopathy is also seen within the right supraclavicular region and left axilla. Thyroid, trachea, and esophagus are grossly unremarkable. Lungs/Pleura: There is a large spiculated right upper lobe mass corresponding to the chest x-ray finding, measuring 4.7 x 3.8 by 4.0 cm. Diffuse interlobular septal thickening throughout the right lung concerning for lymphangitic spread of disease. There is background emphysema. No effusion or pneumothorax. Central airways are patent. Upper Abdomen: No acute abnormality. Musculoskeletal: No acute or destructive bony lesions. Reconstructed images demonstrate no additional findings. Review of the MIP images confirms the above findings. IMPRESSION: 1. 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 Diffuse interlobular septal thickening throughout the right lung concerning for lymphangitic spread of disease. 2. Large pericardial effusion. 3. Lymphadenopathy within the mediastinum, right supraclavicular region, and left axilla, concerning for metastatic disease. 4. No evidence of pulmonary embolus. 5. Aortic Atherosclerosis (ICD10-I70.0) and Emphysema (ICD10-J43.9). Electronically Signed   By: Randa Ngo M.D.   On: 03/27/2021 18:24    ____________________________________________   PROCEDURES  Procedure(s) performed (including Critical Care): Critical care time 45 minutes.  This includes evaluating the patient and speaking with her several times.  Also spoke with the hospitalist and cardiologist.  The ultrasound tech and I collaborated on the bedside ultrasound of the pericardium.  Procedures   ____________________________________________   INITIAL IMPRESSION / ASSESSMENT AND PLAN / ED COURSE  Patient is a smoker.  This mass may be a lung cancer although the white  blood count being elevated with a tachycardia and occasional fever could be a pneumonia or round pneumonia or atypical pneumonia.  We will also evaluate her for pulmonary embolus.  If it is a lung cancer pulmonary embolus would be a risk. Pro calcitonin has just returned it is negative.  With the dark urine we will also get liver functions and CK.  Her BUN is elevated more than 20 to one of her creatinine.  She may be dehydrated    ----------------------------------------- 6:45 PM on 03/27/2021 ----------------------------------------- CT report is returned lung mass does look like a cancer.  Patient does have a UTI with hematuria still the elevated white count pregnancy test is also positive.  Not sure why that is.  Possibly there is an hCG  secreting tumor.  I have ordered an ultrasound of the pelvis and of the kidneys.  The patient is not running a fever.  Her procalcitonin is negative.  Her heart rate is coming down.  The lactic acid is decreasing as well.  We will continue treating her.  She may have a UTI causing the elevated white count.    Patient complaining of somewhat more shortness of breath.  After discussing the patient with the hospitalist we got the cardiologist come and has bedside ultrasound showed large pericardial effusion and apparent right ventricular collapse.  Dr. Saunders Revel came in to evaluate the patient himself and took her to the lab for drainage for early pericardial tamponade.  ____________________________________________   FINAL CLINICAL IMPRESSION(S) / ED DIAGNOSES  Final diagnoses:  Weakness  Urinary tract infection with hematuria, site unspecified  Tachycardia  Shortness of breath  Elevated lactic acid level  Lung mass     ED Discharge Orders          Ordered    US PELVIC COMPLETE W TRANSVAGINAL AND TORSION R/O        03/27/21 1802             Note:  This document was prepared using Dragon voice recognition software and may include unintentional  dictation errors.    Nena Polio, MD 03/27/21 [phone]
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
## 2021-03-27 NOTE — ED Triage Notes (Signed)
Pt comes with c/o weakness, SOB and just not feeling herself. Pt states this all started 3 weeks ago. Pt states SOB just the past few days. Pt states epigastric pain  Pt states dark colored urine. Pt denies any pain or odor.

## 2021-03-27 NOTE — ED Notes (Signed)
CODE STEMI (Activated @ 1909 with Tammy at [place])

## 2021-03-27 NOTE — Progress Notes (Signed)
Substernal site with drainage line sutured in

## 2021-03-27 NOTE — Interval H&P Note (Signed)
History and Physical Interval Note:  03/27/2021 8:40 PM  Nichole Park  has presented today for surgery, with the diagnosis of pericardial effusion and cardiac tamponade.  The various methods of treatment have been discussed with the patient and family. After consideration of risks, benefits and other options for treatment, the patient has consented to  Procedure(s): PERICARDIOCENTESIS (N/A) as a surgical intervention.  The patient's history has been reviewed, patient examined, no change in status, stable for surgery.  I have reviewed the patient's chart and labs.  Questions were answered to the patient's satisfaction.     Dominie Benedick

## 2021-03-27 NOTE — Consult Note (Addendum)
NAME:  Nichole Park, MRN:  [id number], DOB:  October 19, 1960, LOS: 0 ADMISSION DATE:  03/27/2021, CONSULTATION DATE:  03/27/2021 REFERRING MD: Nelva Bush, MD.  CHIEF COMPLAINT: SOB   History of present illness   A 60 year old  female with no pertinent medical history  other than remote hx of Graves Disease and current everyday smoker who presented to the ED from [facility] walk in [facility] with sharp epigastric pain, progressive SOB onset 3 weeks ago associated with generalized weakness.  Patient reported generalized weakness and mild left-sided chest discomfort that worsened with inspiration and was not associated with exertion. She reports intermittent systemic symptoms of fevers, and weight loss (8-10lbs over a month) , productive cough, and dark urine. She denied cardiovascular symptoms of orthopnea, paroxysmal nocturnal dyspnea, hemoptysis, palpitations, and leg swelling, respiratory symptoms including runny rose, sore throat, sneezing, and cough, gastrointestinal symptoms of vomiting and bowel habit changes. She has history of cigarette smoking 1 pack /day since the age of 60 years old. The patient denied chest and abdominal trauma and recent long-distance travel. When the symptoms worsened yesterday, she went to walk in clinic who sent him to the emergency department for further evaluation of these symptoms.  ED Course: On arrival to the ED, she was afebrile with blood pressure 102/80 mm Hg and pulse rate 119 beats/min, RR 25 breaths per minute, oxygen saturation was over 95% on room airThere were no focal neurological deficits; she was alert and oriented x4, and he did not demonstrate any memory deficits.  Laboratory evaluation was significant for  WBC/Hgb/Hct/Plts:  21.7, 21.1/15.7, 15.6/45.4, 46.6/467, 490 (07/19 1502). Na+ 131, CO2 20, glucose 144, BUN 34, otherwise unremarkable CMP. Other Labs: Baseline procalcitonin <0.10, TSH 8.150, Lactate: 5.3>3.9, hcg 64. UA: Positive for UTI, Pregnancy, Urine  POSITIVE. Electrocardiography (EKG) showed sinus tachycardia rate of 123 normal axis no acute ST-T wave changes . chest X-ray revealed 4.8 cm mass within the right upper lobe. CTA Chest: Spiculated 4.7 cm right upper lobe mass consistent with malignancy. Diffuse interlobular septal thickening throughout the right lung concerning for lymphangitic spread of disease.2. Large pericardial effusion. 3. Lymphadenopathy within the mediastinum, right supraclavicular region, and left axilla, concerning for metastatic disease.  Given abnormal  findings as above, cardiology was consulted who subsequently performed a bedside echocardiography (ECHO) showing cardiac tamponade physiology.Pulses paradoxus was also abnormal at 20-30 mm Hg. Due to progressive symptoms and findings as above, patient was taken to the cath lab urgently for pericardiocentesis and pericardial drain placement. 500cc of serosanguinous fluid was removed which improved the patient's symptoms remarkably. PCCM consulted for admission to the ICU.  Past Medical History  [facility] Events   7/19: Admitted to ICU with pericardial effusion and cardiac tamponade s/p pericardiocentesis and pericardial drain placement.  Consults:  Cardiology Hematology/Oncology  Procedures:  7/19: pericardiocentesis and pericardial drain placement  Significant Diagnostic Tests:  7/19: Renal Ultrasound: Negative 7/19: CXR: 4.8 cm mass within the right upper lobe 7/19: CTA Chest :Spiculated 4.7 cm right upper lobe mass consistent with malignancy. Diffuse interlobular septal thickening throughout the right lung concerning for lymphangitic spread of disease.2. Large pericardial effusion. 3. Lymphadenopathy within the mediastinum, right supraclavicular region, and left axilla, concerning for metastatic disease  Micro Data:  7/19: SARS-CoV-2 PCR>> negative 7/19: Influenza PCR>> negative 7/19: Blood culture x2>> 7/19: Urine Culture>> 7/19:  MRSA PCR>> negative Antimicrobials:  Cefepime 7/19>  OBJECTIVE  Blood pressure 108/89, pulse (!) 115, temperature 98 F (36.7 C), resp. rate Marland Kitchen)  26, SpO2 93 %.       No intake or output data in the 24 hours ending 03/27/21 2144 There were no vitals filed for this visit.   Physical Examination  GENERAL: 60 year-old critically ill patient lying in the bed with no acute distress.  EYES: Pupils equal, round, reactive to light and accommodation. No scleral icterus. Extraocular muscles intact.  HEENT: Head atraumatic, normocephalic. Oropharynx and nasopharynx clear.  NECK:  Supple, no jugular venous distention. No thyroid enlargement, no tenderness.  LUNGS: Normal breath sounds bilaterally, no wheezing, rales,rhonchi or crepitation. No use of accessory muscles of respiration.  CARDIOVASCULAR: S1, S2 normal. No murmurs, rubs, or gallops.  ABDOMEN: Soft, nontender, nondistended. Bowel sounds present. No organomegaly or mass.  EXTREMITIES: No pedal edema, cyanosis, or clubbing.  NEUROLOGIC: Cranial nerves II through XII are intact.  Muscle strength 5/5 in all extremities. Sensation intact. Gait not checked.  PSYCHIATRIC: The patient is alert and oriented x 3.  SKIN: No obvious rash, lesion, or ulcer.   Labs/imaging that I havepersonally reviewed  (right click and "Reselect all SmartList Selections" daily)     Labs   CBC: Recent Labs  Lab 03/27/21 1502  WBC 21.1*  21.7*  NEUTROABS 15.1*  HGB 15.6*  15.7*  HCT 46.6*  45.4  MCV 88.4  88.0  PLT 490*  467*    Basic Metabolic Panel: Recent Labs  Lab 03/27/21 1502  NA 131*  K 4.5  CL 98  CO2 20*  GLUCOSE 144*  BUN 34*  CREATININE 0.81  CALCIUM 9.4   GFR: CrCl cannot be calculated (Unknown ideal weight.). Recent Labs  Lab 03/27/21 1502 03/27/21 1701 03/27/21 1751  PROCALCITON <0.10  --   --   WBC 21.1*  21.7*  --   --   LATICACIDVEN  --  5.3* 3.9*    Liver Function Tests: Recent Labs  Lab 03/27/21 1635   AST 31  ALT 21  ALKPHOS 74  BILITOT 1.2  PROT 7.7  ALBUMIN 3.2*   No results for input(s): LIPASE, AMYLASE in the last 168 hours. No results for input(s): AMMONIA in the last 168 hours.  ABG No results found for: PHART, PCO2ART, PO2ART, HCO3, TCO2, ACIDBASEDEF, O2SAT   Coagulation Profile: Recent Labs  Lab 03/27/21 1701  INR 1.1    Cardiac Enzymes: Recent Labs  Lab 03/27/21 1635  CKTOTAL 15*    HbA1C: No results found for: HGBA1C  CBG: No results for input(s): GLUCAP in the last 168 hours.  Review of Systems:   Review of Systems  Constitutional:  Positive for chills, fever, malaise/fatigue and weight loss. Negative for diaphoresis.  HENT: Negative.    Eyes: Negative.   Respiratory:  Positive for cough, sputum production, shortness of breath and wheezing. Negative for hemoptysis.   Cardiovascular:  Positive for chest pain. Negative for palpitations, orthopnea and claudication.  Gastrointestinal:  Negative for abdominal pain, blood in stool, constipation, diarrhea, heartburn, melena, nausea and vomiting.  Genitourinary:  Positive for dysuria, frequency, hematuria and urgency. Negative for flank pain.  Musculoskeletal:  Positive for back pain and myalgias.  Skin: Negative.   Neurological: Negative.   Endo/Heme/Allergies:  Negative for environmental allergies and polydipsia. Does not bruise/bleed easily.  Psychiatric/Behavioral: Negative.      Past Medical History  She,  has a past medical history of Graves disease.   Surgical History    Past Surgical History:  Procedure Laterality Date   TUBAL LIGATION       Social  History   reports that she has been smoking cigarettes. She has a 40.00 pack-year smoking history. She has never used smokeless tobacco. She reports current drug use. Drug: Marijuana. She reports that she does not drink alcohol.   Family History   Her family history includes Peripheral Artery Disease in her mother.   Allergies Allergies   Allergen Reactions   Metoprolol    Tapazole [Methimazole]      Home Medications  Prior to Admission medications   Medication Sig Start Date End Date Taking? Authorizing Provider  albuterol (PROVENTIL HFA;VENTOLIN HFA) 108 (90 Base) MCG/ACT inhaler Inhale 2 puffs into the lungs every 6 (six) hours as needed. Patient not taking: Reported on 03/27/2021    [provider]  Multiple Vitamin (MULTI-VITAMINS) TABS Take 1 tablet by mouth daily. Patient not taking: Reported on 03/27/2021    [provider]    Scheduled Meds:  Chlorhexidine Gluconate Cloth  6 each Topical Q0600   ondansetron (ZOFRAN) IV  4 mg Intravenous Once   Continuous Infusions: PRN Meds:.acetaminophen, docusate sodium, polyethylene glycol  Assessment & Plan:  Pericardial effusion with cardiac tamponade  in the setting of suspected malignancy (possible lung adenocarcinoma) in patient with hx of smoking since age 26. CT chest shows spiculated 4.7 cm right upper lobe mass consistent with malignancy with mets S/p emergent pericardiocentesis with removal of 400 ml of hemorrhagic pericardial fluid  & pericardial  drainage tube placement -Supplemental O2 as needed to maintain O2 saturations 88 to 92% -High risk for intubation -Follow intermittent ABG and chest x-ray as needed -Fluid analysis  and cytology pending -As needed bronchodilators -Encourage smoking cessation -Cardiology following, input appreciated -Oncology consult   Sepsis with septic shock due to UTI and possible Pneumonia Lactic: 5.3, Baseline PCT: 0.10, UA: Positive for UTI Initial interventions/workup included: 1 L of LR & Cefepime -Supplemental oxygen as needed, to maintain SpO2 > 90% -f/u cultures, trend lactic/ PCT -monitor WBC/ fever curve -IV antibiotics: cefepime (HCAP/UTI) & vancomycin, for now pending cultures as above. -Gentle IVF hydration as needed -Consider vasopressors to maintain MAP> 65, currently does not require  pressors -Strict I/O's -Consider stress dose steroids for persistent hypotension   Elevated Hcg Level and Positive Urine pregnancy test Elevated/abnormal results likely in the setting of suspected malignancy in a patient with hx of tubal ligation and post-menopausal.  -Transvaginal ultrasound pending -Oncology consult as above  Graves Disease TSH elevated 8.1 repeat 3.2? -Check free T4, T3    Best practice:  Diet:  Oral Pain/Anxiety/Delirium protocol (if indicated): No VAP protocol (if indicated): Not indicated DVT prophylaxis: Contraindicated GI prophylaxis: PPI Glucose control:  SSI No Central venous access:  N/A Arterial line:  N/A Foley:  N/A Mobility:  bed rest  PT consulted: N/A Last date of multidisciplinary goals of care discussion [7/19] Code Status:  full code Disposition: ICU   = Goals of Care = Code Status Order: FULL  Primary Emergency Contact: [place] Wishes to pursue full aggressive treatment and intervention options, including CPR and intubation. Will need ongoing goals of care to be addressed given possible poor prognosis.   Critical care time: 45 minutes     Rufina Falco, DNP, CCRN, FNP-C, AGACNP-BC Acute Care Nurse Practitioner  [facility] Pager: [phone] [place] [facility]  .

## 2021-03-27 NOTE — Consult Note (Addendum)
Cardiology Consultation:   Patient ID: Nichole Park MRN: [id number]; DOB: [birth year]  Admit date: 03/27/2021 Date of Consult: 03/27/2021  PCP:  Merryl Hacker, No   [facility] Providers Cardiologist:  New - Mariabelen Pressly   Patient Profile:   Nichole Park is a 60 y.o. female with a hx of Graves disease who is being seen 03/27/2021 for the evaluation of pericardial effusion at the request of Dr. Cinda Quest.  History of Present Illness:   Nichole Park reports a 3-week history of worsening shortness of breath, generalized malaise and weakness as well as intermittent bilateral lower chest pain.  Symptoms have gradually worsened to the point that she gets out of breath just walking to the bathroom.  Her chest pain is not associated with activity but seems to worsen when she takes a deep breath.  Her progressive symptoms her to go to [place] [facility] today.  There she was noted to be borderline hypotensive with associated lightheadedness.  She was referred to the ED for further evaluation.  In the emergency department, Nichole Park was noted to have soft blood pressure with elevated heart rates.  EKG showed sinus tachycardia.  CTA of the chest to exclude PE was negative for PE but demonstrated a spiculated right upper lobe mass with concern for lymphangitic and metastatic disease.  A large pericardial effusion was also identified.  Initial labs were notable for a white blood cell count of 22,000, lactate of 5.3 and positive urine pregnancy test confirmed with serum beta hCG.  Of note, the patient reports that she has been through menopause and is also status post bilateral tubal ligation.  Currently, she continues to feel short of breath.  She also feels like her abdomen is bloated.  Over the last year, she has also experienced some numbness along her shins.  Her only medication at home is aspirin.  She was previously on metoprolol and some other thyroid medicine, though both were stopped as she developed a rash.  In  the ED, she received a 1 L normal saline bolus as well as 1 g of cefepime.   Past Medical History:  Diagnosis Date   Graves disease     Past Surgical History:  Procedure Laterality Date   TUBAL LIGATION       Home Medications:  Prior to Admission medications   Medication Sig Start Date Brinnley Lacap Date Taking? Authorizing Provider  albuterol (PROVENTIL HFA;VENTOLIN HFA) 108 (90 Base) MCG/ACT inhaler Inhale 2 puffs into the lungs every 6 (six) hours as needed.    [provider]  Multiple Vitamin (MULTI-VITAMINS) TABS Take 1 tablet by mouth daily.    [provider]    Inpatient Medications: Scheduled Meds:  [MAR Hold] ondansetron (ZOFRAN) IV  4 mg Intravenous Once   Continuous Infusions:  PRN Meds:   Allergies:    Allergies  Allergen Reactions   Metoprolol    Tapazole [Methimazole]     Social History:   Social History   Socioeconomic History   Marital status: Widowed    Spouse name: Not on file   Number of children: Not on file   Years of education: Not on file   Highest education level: Not on file  Occupational History   Not on file  Tobacco Use   Smoking status: Every Day    Packs/day: 1.00    Years: 40.00    Pack years: 40.00    Types: Cigarettes   Smokeless tobacco: Never  Substance and Sexual Activity  Alcohol use: No   Drug use: Yes    Types: Marijuana   Sexual activity: Not on file  Other Topics Concern   Not on file  Social History Narrative   Not on file   Social Determinants of Health   Financial Resource Strain: Not on file  Food Insecurity: Not on file  Transportation Needs: Not on file  Physical Activity: Not on file  Stress: Not on file  Social Connections: Not on file  Intimate Partner Violence: Not on file    Family History:   Family History  Problem Relation Age of Onset   Peripheral Artery Disease Mother      ROS:  Please see the history of present illness. All other ROS reviewed and negative.      Physical Exam/Data:   Vitals:   03/27/21 1845 03/27/21 1900 03/27/21 1918 03/27/21 1930  BP:   120/83 108/89  Pulse: (!) 117 (!) 123 (!) 119 (!) 115  Resp: 19 (!) 24 (!) 32 (!) 26  Temp:      SpO2: 93% 94% 97% 93%   No intake or output data in the 24 hours ending 03/27/21 2037 Last 3 Weights 03/19/2017  Weight (lbs) 124 lb  Weight (kg) 56.246 kg     Pulses paradoxus: 20-30 mmHg (difficult to assess due to tachypnea)  There is no height or weight on file to calculate BMI.  General: Chronically ill-appearing woman lying in bed.  Her daughter is at the bedside. HEENT: normal Lymph: no adenopathy Neck: Difficult to assess JVP due to tachypnea with accessory muscle use. Endocrine:  No thryomegaly Vascular: No carotid bruits; FA pulses 2+ bilaterally without bruits  Cardiac: Tachycardic but regular with distant heart sounds.  No murmurs, rubs, or gallops. Lungs: Mildly diminished breath sounds throughout with tachypnea and intermittent accessory muscle use Abd: soft, nontender and mildly distended. Ext: no edema Musculoskeletal:  No deformities, BUE and BLE strength normal and equal Skin: warm and dry  Neuro:  CNs 2-12 intact, no focal abnormalities noted Psych:  Normal affect   EKG:  The EKG was personally reviewed and demonstrates: Sinus tachycardia with low voltage. Telemetry:  Telemetry was personally reviewed and demonstrates: Sinus tachycardia  Relevant CV Studies: Bedside echocardiogram shows vigorous LV and RV contraction with apparent underfilling.  There is a large circumferential pericardial effusion measuring 2 cm in the subcostal view.  IVC is dilated with decreased respiratory variation.  There is early diastolic collapse of the right ventricle as well as diastolic invagination of the right atrium.  Laboratory Data:  High Sensitivity Troponin:  No results for input(s): TROPONINIHS in the last 720 hours.   Chemistry Recent Labs  Lab 03/27/21 1502  NA 131*  K  4.5  CL 98  CO2 20*  GLUCOSE 144*  BUN 34*  CREATININE 0.81  CALCIUM 9.4  GFRNONAA >60  ANIONGAP 13    Recent Labs  Lab 03/27/21 1635  PROT 7.7  ALBUMIN 3.2*  AST 31  ALT 21  ALKPHOS 74  BILITOT 1.2   Hematology Recent Labs  Lab 03/27/21 1502  WBC 21.1*  21.7*  RBC 5.27*  5.16*  HGB 15.6*  15.7*  HCT 46.6*  45.4  MCV 88.4  88.0  MCH 29.6  30.4  MCHC 33.5  34.6  RDW 12.8  12.7  PLT 490*  467*   BNPNo results for input(s): BNP, PROBNP in the last 168 hours.  DDimer No results for input(s): DDIMER in the last 168 hours.  Radiology/Studies:  DG Chest 1 View  Result Date: 03/27/2021 CLINICAL DATA:  Shortness of breath EXAM: CHEST  1 VIEW COMPARISON:  None. FINDINGS: Cardiomegaly. 4.8 cm mass projects within the right upper lobe. Mildly hyperexpanded lungs with coarsened interstitial markings bilaterally. No pleural effusion or pneumothorax. No acute bony findings. IMPRESSION: 1. 4.8 cm mass within the right upper lobe. CT chest with contrast is recommended for further evaluation. 2. Cardiomegaly. Electronically Signed   By: Davina Poke D.O.   On: 03/27/2021 15:52   CT Angio Chest PE W and/or Wo Contrast  Result Date: 03/27/2021 CLINICAL DATA:  Tachycardia, short of breath, pleuritic chest pain, abnormal chest x-ray EXAM: CT ANGIOGRAPHY CHEST WITH CONTRAST TECHNIQUE: Multidetector CT imaging of the chest was performed using the standard protocol during bolus administration of intravenous contrast. Multiplanar CT image reconstructions and MIPs were obtained to evaluate the vascular anatomy. CONTRAST:  119mL OMNIPAQUE IOHEXOL 350 MG/ML SOLN COMPARISON:  03/27/2021 FINDINGS: Cardiovascular: This is a technically adequate evaluation of the pulmonary vasculature. No filling defects or pulmonary emboli. There is a large pericardial effusion measuring up to 2.3 cm in thickness. Uniform decreased attenuation suggest transudative effusion. Normal caliber of the thoracic  aorta. Mild atherosclerosis of the aorta and coronary vasculature. Mediastinum/Nodes: Pathologically enlarged lymph nodes are seen within the mediastinum, measuring up to 13 mm in short axis reference image 24/4. Adenopathy is also seen within the right supraclavicular region and left axilla. Thyroid, trachea, and esophagus are grossly unremarkable. Lungs/Pleura: There is a large spiculated right upper lobe mass corresponding to the chest x-ray finding, measuring 4.7 x 3.8 by 4.0 cm. Diffuse interlobular septal thickening throughout the right lung concerning for lymphangitic spread of disease. There is background emphysema. No effusion or pneumothorax. Central airways are patent. Upper Abdomen: No acute abnormality. Musculoskeletal: No acute or destructive bony lesions. Reconstructed images demonstrate no additional findings. Review of the MIP images confirms the above findings. IMPRESSION: 1. 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 Diffuse interlobular septal thickening throughout the right lung concerning for lymphangitic spread of disease. 2. Large pericardial effusion. 3. Lymphadenopathy within the mediastinum, right supraclavicular region, and left axilla, concerning for metastatic disease. 4. No evidence of pulmonary embolus. 5. Aortic Atherosclerosis (ICD10-I70.0) and Emphysema (ICD10-J43.9). Electronically Signed   By: Randa Ngo M.D.   On: 03/27/2021 18:24   US Renal  Result Date: 03/27/2021 CLINICAL DATA:  UTI EXAM: RENAL / URINARY TRACT ULTRASOUND COMPLETE COMPARISON:  CT 03/27/2021 FINDINGS: Right Kidney: Renal measurements: 10.9 x 4 x 4.4 cm = volume: 101 mL. Echogenicity within normal limits. No mass or hydronephrosis visualized. Left Kidney: Renal measurements: 11.1 x 4.9 x 5.8 cm = volume: 165 mL. Echogenicity within normal limits. No mass or hydronephrosis visualized. Bladder: Appears normal for degree of bladder distention. Other: None. IMPRESSION: Negative renal  ultrasound Electronically Signed   By: Donavan Foil M.D.   On: 03/27/2021 19:37     Assessment and Plan:   Pericardial effusion and cardiac tamponade: Patient presents with 3 weeks of worsening shortness of breath and is found to have right upper lobe lung mass and large pericardial effusion.  Patient also has notable leukocytosis with elevated lactate which could indicate a degree of sepsis contributing to her symptoms.  Echo I was personally performed at the bedside suggest tamponade physiology.  Pulses paradoxus was also abnormal at 20-30 mmHg.  Given her significant dyspnea, borderline hypotension, and echo findings, we have discussed urgent pericardiocentesis and pericardial drain placement.  After reviewing the risks and benefits of the procedure, Ms. Owczarzak has agreed to proceed.  Further recommendations to follow procedure.  Leukocytosis and possible sepsis: Patient has received cefepime.  She reports low-grade fevers at home over the last couple days.  Further management per primary team following completion of pericardiocentesis.  We will send pericardial fluid for routine analyses including cell count and gram stain.  Elevated beta hCG: Patient reports having completed menopause and is also status post bilateral tubal ligation.  I am concerned that her beta-hCG may be related to suspected malignancy.  Given that radiation may be needed during pericardiocentesis, we have discussed potential risks to an unborn child, which Ms. Andy voices understanding of and is agreeable to proceeding.  For questions or updates, please contact [place] Please consult [email or web address] for contact info under [facility].   Signed, Nelva Bush, MD  03/27/2021 8:37 PM

## 2021-03-28 ENCOUNTER — Encounter: Payer: Self-pay | Admitting: Internal Medicine

## 2021-03-28 ENCOUNTER — Inpatient Hospital Stay (HOSPITAL_COMMUNITY)
Admit: 2021-03-28 | Discharge: 2021-03-28 | Disposition: A | Discharge status: Home / Self Care | Payer: Medicaid Other | Attending: Internal Medicine | Admitting: Internal Medicine

## 2021-03-28 DIAGNOSIS — I3131 Malignant pericardial effusion in diseases classified elsewhere: Secondary | ICD-10-CM

## 2021-03-28 DIAGNOSIS — I314 Cardiac tamponade: Secondary | ICD-10-CM

## 2021-03-28 DIAGNOSIS — I313 Pericardial effusion (noninflammatory): Secondary | ICD-10-CM

## 2021-03-28 DIAGNOSIS — C801 Malignant (primary) neoplasm, unspecified: Secondary | ICD-10-CM

## 2021-03-28 DIAGNOSIS — Z720 Tobacco use: Secondary | ICD-10-CM

## 2021-03-28 LAB — TYPE AND SCREEN
ABO/RH(D): B POS
Antibody Screen: NEGATIVE

## 2021-03-28 LAB — ECHOCARDIOGRAM COMPLETE
AR max vel: 3.4 cm2
AV Area VTI: 3.44 cm2
AV Area mean vel: 3.66 cm2
AV Mean grad: 2 mmHg
AV Peak grad: 4.7 mmHg
Ao pk vel: 1.08 m/s
Area-P 1/2: 5.07 cm2
S' Lateral: 3.3 cm
Weight: 2059.98 oz

## 2021-03-28 LAB — BASIC METABOLIC PANEL
Anion gap: 8 (ref 5–15)
BUN: 22 mg/dL — ABNORMAL HIGH (ref 6–20)
CO2: 23 mmol/L (ref 22–32)
Calcium: 8.6 mg/dL — ABNORMAL LOW (ref 8.9–10.3)
Chloride: 104 mmol/L (ref 98–111)
Creatinine, Ser: 0.58 mg/dL (ref 0.44–1.00)
GFR, Estimated: >60 mL/min (ref >60)
Glucose, Bld: 120 mg/dL — ABNORMAL HIGH (ref 70–99)
Potassium: 4.1 mmol/L (ref 3.5–5.1)
Sodium: 135 mmol/L (ref 135–145)

## 2021-03-28 LAB — CBC
HCT: 40.5 % (ref 36.0–46.0)
Hemoglobin: 13.8 g/dL (ref 12.0–15.0)
MCH: 29.9 pg (ref 26.0–34.0)
MCHC: 34.1 g/dL (ref 30.0–36.0)
MCV: 87.7 fL (ref 80.0–100.0)
Platelets: 377 10*3/uL (ref 150–400)
RBC: 4.62 MIL/uL (ref 3.87–5.11)
RDW: 12.6 % (ref 11.5–15.5)
WBC: 20.3 10*3/uL — ABNORMAL HIGH (ref 4.0–10.5)
nRBC: 0 % (ref 0.0–0.2)

## 2021-03-28 LAB — LACTIC ACID, PLASMA: Lactic Acid, Venous: 1.7 mmol/L (ref 0.5–1.9)

## 2021-03-28 LAB — MAGNESIUM: Magnesium: 2.2 mg/dL (ref 1.7–2.4)

## 2021-03-28 LAB — MRSA NEXT GEN BY PCR, NASAL: MRSA by PCR Next Gen: NOT DETECTED

## 2021-03-28 LAB — HIV ANTIBODY (ROUTINE TESTING W REFLEX): HIV Screen 4th Generation wRfx: NONREACTIVE

## 2021-03-28 LAB — T4, FREE: Free T4: 1.22 ng/dL — ABNORMAL HIGH (ref 0.61–1.12)

## 2021-03-28 LAB — TSH: TSH: 3.02 u[IU]/mL (ref 0.350–4.500)

## 2021-03-28 LAB — GLUCOSE, CAPILLARY: Glucose-Capillary: 101 mg/dL — ABNORMAL HIGH (ref 70–99)

## 2021-03-28 LAB — PHOSPHORUS: Phosphorus: 3.3 mg/dL (ref 2.5–4.6)

## 2021-03-28 MED ORDER — CEFTRIAXONE SODIUM 1 G IJ SOLR
1.0000 g | INTRAMUSCULAR | Status: AC
  Administered 2021-03-29: 1 g via INTRAVENOUS
  Filled 2021-03-28: qty 1

## 2021-03-28 MED ORDER — PERFLUTREN LIPID MICROSPHERE
1.0000 mL | INTRAVENOUS | Status: AC | PRN
  Administered 2021-03-28: 2 mL via INTRAVENOUS
  Filled 2021-03-28: qty 10

## 2021-03-28 MED ORDER — NYSTATIN 100000 UNIT/ML MT SUSP
5.0000 mL | Freq: Four times a day (QID) | OROMUCOSAL | Status: DC
  Administered 2021-03-28 – 2021-04-09 (×40): 500000 [IU] via ORAL
  Filled 2021-03-28 (×54): qty 5

## 2021-03-28 MED ORDER — IPRATROPIUM-ALBUTEROL 0.5-2.5 (3) MG/3ML IN SOLN: 3.0000 mL | Freq: Three times a day (TID) | RESPIRATORY_TRACT | Status: DC

## 2021-03-28 MED ORDER — ORAL CARE MOUTH RINSE
15.0000 mL | Freq: Two times a day (BID) | OROMUCOSAL | Status: DC
  Administered 2021-03-28 – 2021-04-09 (×19): 15 mL via OROMUCOSAL

## 2021-03-28 MED ORDER — OXYCODONE HCL 5 MG PO TABS: 5.0000 mg | ORAL_TABLET | Freq: Four times a day (QID) | ORAL | Status: DC | PRN

## 2021-03-28 MED ORDER — KETOROLAC TROMETHAMINE 15 MG/ML IJ SOLN: 15.0000 mg | Freq: Four times a day (QID) | INTRAMUSCULAR | Status: AC | PRN

## 2021-03-28 MED ORDER — IPRATROPIUM-ALBUTEROL 0.5-2.5 (3) MG/3ML IN SOLN
3.0000 mL | Freq: Three times a day (TID) | RESPIRATORY_TRACT | Status: DC
  Administered 2021-03-28 – 2021-03-29 (×3): 3 mL via RESPIRATORY_TRACT
  Filled 2021-03-28 (×4): qty 3

## 2021-03-28 MED ORDER — NICOTINE 21 MG/24HR TD PT24
21.0000 mg | MEDICATED_PATCH | Freq: Every day | TRANSDERMAL | Status: DC
  Administered 2021-03-28: 21 mg via TRANSDERMAL
  Filled 2021-03-28 (×9): qty 1

## 2021-03-28 MED ORDER — SODIUM CHLORIDE 0.9 % IV SOLN
1.0000 g | INTRAVENOUS | Status: DC
  Administered 2021-03-28: 1 g via INTRAVENOUS
  Filled 2021-03-28: qty 1

## 2021-03-28 MED ORDER — KETOROLAC TROMETHAMINE 30 MG/ML IJ SOLN
30.0000 mg | Freq: Once | INTRAMUSCULAR | Status: DC
  Filled 2021-03-28: qty 1

## 2021-03-28 NOTE — Consult Note (Addendum)
[name]  [phone] [phone]) [phone]  ID: ADANNA ZUCKERMAN OB: 1960-11-30  MR#: [id number]  GQQ#:[id number]  Nichole Park Care Team: Pcp, No as PCP - General  CHIEF COMPLAINT: Lung Mass  History of Present Illness: Nichole Park is a 60 year old female with no significant past medical history other than remote history of Graves' disease and current everyday smoker who presents to the emergency room for sharp epigastric pain, progressive shortness of breath over the past 3 weeks and generalized weakness.  Reports over the past few months she has been feeling more weak with mild left-sided chest pain that was worse with inspiration.  Reported intermittent fevers, weight loss, productive cough and dark urine.  She has been smoking since the age of 42 1 pack of cigarettes per day.  Initial work-up in the emergency room showed tachycardia, hypotension and tachypnea with normal oxygen saturations.  Lab work was fairly stable except for TSH 8.15, HCG 64 and a UA was positive for UTI.  Her urine pregnancy was positive.  EKG showed sinus tachycardia.  Chest x-ray showed 4.8 cm mass within the right upper lobe and CTA showed spiculated 4.7 cm right upper lobe mass consistent with malignancy.  There was diffuse interlobular septal thickening throughout the right lung concerning for lymphangitic spread of disease, large pericardial effusion and lymphadenopathy within the mediastinum and right supraclavicular region and left axilla.  Cardiology was consulted and performed a bedside echo showing cardiac tamponade physiology.  Due to progressive symptoms she was taken to the Cath Lab for pericardiocentesis and pericardial drain placement. Fluid was sent for analysis and cytology. She was admitted to ICU.  She was started on cefepime (HCAP/UTI) and vancomycin.  Blood cultures are pending.  Has not required vasopressors.   Plan is for close monitoring (frequent labs, vital signs),  transvaginal ultrasound due to elevated hCG levels and positive urine pregnancy and oncology and cardiology was consulted.   INTERVAL HISTORY: Nichole Park had an uneventful evening.  Reports occasional shortness of breath and was able to stand and pivot to use the bedside commode.  She has an occasional cough and is coughing up white phlegm.  She continues to have left-sided chest pain that is better.  She was given some soft foods for breakfast and lunch and she was able to tolerate.  Overall feels improved.  REVIEW OF SYSTEMS:   Review of Systems  Constitutional:  Positive for malaise/fatigue. Negative for chills, fever and weight loss.  HENT:  Positive for congestion. Negative for ear pain and tinnitus.   Eyes: Negative.  Negative for blurred vision and double vision.  Respiratory:  Positive for sputum production and shortness of breath. Negative for cough.   Cardiovascular:  Positive for chest pain. Negative for palpitations and leg swelling.  Gastrointestinal: Negative.  Negative for abdominal pain, constipation, diarrhea, nausea and vomiting.  Genitourinary:  Negative for dysuria, frequency and urgency.  Musculoskeletal:  Negative for back pain and falls.  Skin: Negative.  Negative for rash.  Neurological:  Positive for weakness. Negative for headaches.  Endo/Heme/Allergies: Negative.  Does not bruise/bleed easily.  Psychiatric/Behavioral:  Negative for depression. The Nichole Park is nervous/anxious. The Nichole Park does not have insomnia.    As per HPI. Otherwise, a complete review of systems is negative.  PAST MEDICAL HISTORY: Past Medical History:  Diagnosis Date   Graves disease     PAST SURGICAL HISTORY: Past Surgical History:  Procedure Laterality Date   PERICARDIOCENTESIS N/A 03/27/2021   Procedure: PERICARDIOCENTESIS;  Surgeon: End,  Harrell Gave, MD;  Location: [facility];  Service: Cardiovascular;  Laterality: N/A;   TUBAL LIGATION      FAMILY HISTORY: Family History   Problem Relation Age of Onset   Peripheral Artery Disease Mother     ADVANCED DIRECTIVES (Y/N):  @ADVDIR @  HEALTH MAINTENANCE: Social History   Tobacco Use   Smoking status: Every Day    Packs/day: 1.00    Years: 40.00    Pack years: 40.00    Types: Cigarettes   Smokeless tobacco: Never  Substance Use Topics   Alcohol use: No   Drug use: Yes    Types: Marijuana     Colonoscopy:  PAP:  Bone density:  Lipid panel:  Allergies  Allergen Reactions   Metoprolol    Tapazole [Methimazole]     Current Facility-Administered Medications  Medication Dose Route Frequency Provider Last Rate Last Admin   acetaminophen (TYLENOL) tablet 650 mg  650 mg Oral Q6H PRN End, Christopher, MD   650 mg at 03/28/21 0643   cefTRIAXone (ROCEPHIN) 1 g in sodium chloride 0.9 % 100 mL IVPB  1 g Intravenous Q24H Darel Hong D, NP       Chlorhexidine Gluconate Cloth 2 % PADS 6 each  6 each Topical Q0600 Bennie Pierini, MD   6 each at 03/28/21 0515   docusate sodium (COLACE) capsule 100 mg  100 mg Oral BID PRN Lang Snow, NP       ondansetron (ZOFRAN) injection 4 mg  4 mg Intravenous Once End, Christopher, MD       polyethylene glycol (MIRALAX / GLYCOLAX) packet 17 g  17 g Oral Daily PRN Lang Snow, NP       Facility-Administered Medications Ordered in Other Encounters  Medication Dose Route Frequency Provider Last Rate Last Admin   perflutren lipid microspheres (DEFINITY) IV suspension  1-10 mL Intravenous PRN Lang Snow, NP   2 mL at 03/28/21 0903    OBJECTIVE: Vitals:   03/28/21 0500 03/28/21 0600  BP: 121/85 133/89  Pulse: 98 (!) 101  Resp: (!) 23 (!) 22  Temp:    SpO2: 92% 96%     Body mass index is 23.93 kg/m.    ECOG FS:1 - Symptomatic but completely ambulatory  Physical Exam Vitals reviewed.  Constitutional:      Appearance: Normal appearance.  HENT:     Head: Normocephalic and atraumatic.  Eyes:     Pupils: Pupils are equal,  round, and reactive to light.  Cardiovascular:     Rate and Rhythm: Normal rate and regular rhythm.     Heart sounds: Normal heart sounds. No murmur heard. Pulmonary:     Effort: Pulmonary effort is normal.     Breath sounds: Normal breath sounds. No wheezing.  Abdominal:     General: Bowel sounds are normal. There is no distension.     Palpations: Abdomen is soft.     Tenderness: There is no abdominal tenderness.  Musculoskeletal:        General: Normal range of motion.     Cervical back: Normal range of motion.  Skin:    General: Skin is warm and dry.     Findings: No rash.  Neurological:     Mental Status: She is alert and oriented to person, place, and time.  Psychiatric:        Judgment: Judgment normal.      LAB RESULTS:  Lab Results  Component Value Date   NA 135  03/28/2021   K 4.1 03/28/2021   CL 104 03/28/2021   CO2 23 03/28/2021   GLUCOSE 120 (H) 03/28/2021   BUN 22 (H) 03/28/2021   CREATININE 0.58 03/28/2021   CALCIUM 8.6 (L) 03/28/2021   PROT 7.7 03/27/2021   ALBUMIN 3.2 (L) 03/27/2021   AST 31 03/27/2021   ALT 21 03/27/2021   ALKPHOS 74 03/27/2021   BILITOT 1.2 03/27/2021   GFRNONAA >60 03/28/2021   GFRAA >60 03/19/2017    Lab Results  Component Value Date   WBC 20.3 (H) 03/28/2021   NEUTROABS 15.1 (H) 03/27/2021   HGB 13.8 03/28/2021   HCT 40.5 03/28/2021   MCV 87.7 03/28/2021   PLT 377 03/28/2021     STUDIES: DG Chest 1 View  Result Date: 03/27/2021 CLINICAL DATA:  Shortness of breath EXAM: CHEST  1 VIEW COMPARISON:  None. FINDINGS: Cardiomegaly. 4.8 cm mass projects within the right upper lobe. Mildly hyperexpanded lungs with coarsened interstitial markings bilaterally. No pleural effusion or pneumothorax. No acute bony findings. IMPRESSION: 1. 4.8 cm mass within the right upper lobe. CT chest with contrast is recommended for further evaluation. 2. Cardiomegaly. Electronically Signed   By: Davina Poke D.O.   On: 03/27/2021 15:52   CT  Angio Chest PE W and/or Wo Contrast  Result Date: 03/27/2021 CLINICAL DATA:  Tachycardia, short of breath, pleuritic chest pain, abnormal chest x-ray EXAM: CT ANGIOGRAPHY CHEST WITH CONTRAST TECHNIQUE: Multidetector CT imaging of the chest was performed using the standard protocol during bolus administration of intravenous contrast. Multiplanar CT image reconstructions and MIPs were obtained to evaluate the vascular anatomy. CONTRAST:  175mL OMNIPAQUE IOHEXOL 350 MG/ML SOLN COMPARISON:  03/27/2021 FINDINGS: Cardiovascular: This is a technically adequate evaluation of the pulmonary vasculature. No filling defects or pulmonary emboli. There is a large pericardial effusion measuring up to 2.3 cm in thickness. Uniform decreased attenuation suggest transudative effusion. Normal caliber of the thoracic aorta. Mild atherosclerosis of the aorta and coronary vasculature. Mediastinum/Nodes: Pathologically enlarged lymph nodes are seen within the mediastinum, measuring up to 13 mm in short axis reference image 24/4. Adenopathy is also seen within the right supraclavicular region and left axilla. Thyroid, trachea, and esophagus are grossly unremarkable. Lungs/Pleura: There is a large spiculated right upper lobe mass corresponding to the chest x-ray finding, measuring 4.7 x 3.8 by 4.0 cm. Diffuse interlobular septal thickening throughout the right lung concerning for lymphangitic spread of disease. There is background emphysema. No effusion or pneumothorax. Central airways are patent. Upper Abdomen: No acute abnormality. Musculoskeletal: No acute or destructive bony lesions. Reconstructed images demonstrate no additional findings. Review of the MIP images confirms the above findings. IMPRESSION: 1. 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 Diffuse interlobular septal thickening throughout the right lung concerning for lymphangitic spread of disease. 2. Large pericardial effusion. 3. Lymphadenopathy  within the mediastinum, right supraclavicular region, and left axilla, concerning for metastatic disease. 4. No evidence of pulmonary embolus. 5. Aortic Atherosclerosis (ICD10-I70.0) and Emphysema (ICD10-J43.9). Electronically Signed   By: Randa Ngo M.D.   On: 03/27/2021 18:24   CARDIAC CATHETERIZATION  Result Date: 03/27/2021 Conclusions: Successful pericardiocentesis and tube pericardiotomy via the subxiphoid approach, yielding 500 mL of serosanginous fluid. Recommendations: Admit to ICU. Obtain STAT portable CXR. Follow-up fluid analyses, including cytology. Repeat echocardiogram tomorrow.  Anticipate removal of drain when output is less than 50-100 mL/24 hours and minimal residual fluid is present by echo. Nelva Bush, MD [facility]  US Renal  Result Date:  03/27/2021 CLINICAL DATA:  UTI EXAM: RENAL / URINARY TRACT ULTRASOUND COMPLETE COMPARISON:  CT 03/27/2021 FINDINGS: Right Kidney: Renal measurements: 10.9 x 4 x 4.4 cm = volume: 101 mL. Echogenicity within normal limits. No mass or hydronephrosis visualized. Left Kidney: Renal measurements: 11.1 x 4.9 x 5.8 cm = volume: 165 mL. Echogenicity within normal limits. No mass or hydronephrosis visualized. Bladder: Appears normal for degree of bladder distention. Other: None. IMPRESSION: Negative renal ultrasound Electronically Signed   By: Donavan Foil M.D.   On: 03/27/2021 19:37   DG Chest Port 1 View  Result Date: 03/27/2021 CLINICAL DATA:  Cardiac tamponade EXAM: PORTABLE CHEST 1 VIEW COMPARISON:  Chest x-ray 03/27/2021, CT chest 03/27/2021 FINDINGS: Right upper lobe lung mass redemonstrated. Emphysema with bronchitic changes. Cardiomegaly. Interim placement of drainage catheter over the heart presumably a pericardial drainage catheter. Cardiac silhouette decreased compared to prior. Slight increased interstitial opacity possibly superimposed edema. No pneumothorax IMPRESSION: 1. Placement of presumed pericardial drainage catheter with  decreased cardiomegaly 2. Emphysema with chronic bronchitic change, slight increased interstitial opacity may reflect superimposed mild edema 3. Redemonstrated spiculated lung mass at the right apex Electronically Signed   By: Donavan Foil M.D.   On: 03/27/2021 22:28    ASSESSMENT: Nichole Park is a 60 year old female who presented to the emergency room on 03/27/2021 for weakness, progressive shortness of breath and chest pain.  She was found to have pericardial effusion (status post pericardiocentesis and dain) causing cardiac tamponade and a large lung mass.  Lung Mass: CT chest showed a 4.7 cm right upper lobe mass consistent with malignancy with diffuse interlobular septal thickening throughout the right lung concerning for lymphangitic spread of disease and large pericardial effusion with lymphadenopathy within the mediastinum, right supraclavicular region and left axilla. Awaiting fluid analysis and cytology from pericardiocentesis.   Sepsis due to UTI: Currently on Maxipime and prophylactically on vancomycin. Blood cultures are still pending. Lactic 5.3-trending  Pericardial effusion with cardiac tamponade: Secondary to lung mass. Had pericardiocentesis yesterday with 400 mL of fluid removed with drain placement-analysis and cytology pending. She is requiring 2 L of oxygen.  Graves' disease: TSH 8.150. Free T4 and T3 are pending.  PLAN:   Will await cytology and fluid analysis from pericardiocentesis.  When stable, she would likely need a tissue biopsy.   I spent 60 minutes dedicated to the care of this Nichole Park (face-to-face and non-face-to-face) on the date of the encounter to include what is described in the assessment and plan.  Nichole Park expressed understanding and was in agreement with this plan. She also understands that She can call clinic at any time with any questions, concerns, or complaints.   Cancer Staging No matching staging information was found for the  Nichole Park.  Jacquelin Hawking, NP   03/28/2021 9:59 AM

## 2021-03-28 NOTE — Progress Notes (Signed)
NAME:  Nichole Park, MRN:  [id number], DOB:  1960/10/22, LOS: 1 ADMISSION DATE:  03/27/2021, CONSULTATION DATE:  03/27/2021 REFERRING MD:  Dr. Saunders Revel, CHIEF COMPLAINT:  SOB   Brief Pt Description / Synopsis:  60 y.o. 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   History of Present Illness:  A 60 year old  female with no pertinent medical history  other than remote hx of Graves Disease and current everyday smoker who presented to the ED from [facility] walk in Clinic with sharp epigastric pain, progressive SOB onset 3 weeks ago associated with generalized weakness.   Patient reported generalized weakness and mild left-sided chest discomfort that worsened with inspiration and was not associated with exertion. She reports intermittent systemic symptoms of fevers, and weight loss (8-10lbs over a month) , productive cough, and dark urine. She denied cardiovascular symptoms of orthopnea, paroxysmal nocturnal dyspnea, hemoptysis, palpitations, and leg swelling, respiratory symptoms including runny rose, sore throat, sneezing, and cough, gastrointestinal symptoms of vomiting and bowel habit changes. She has history of cigarette smoking 1 pack /day since the age of 60 years old. The patient denied chest and abdominal trauma and recent long-distance travel. When the symptoms worsened yesterday, she went to walk in clinic who sent him to the emergency department for further evaluation of these symptoms.   ED Course: On arrival to the ED, she was afebrile with blood pressure 102/80 mm Hg and pulse rate 119 beats/min, RR 25 breaths per minute, oxygen saturation was over 95% on room airThere were no focal neurological deficits; she was alert and oriented x4, and he did not demonstrate any memory deficits.  Laboratory evaluation was significant for  WBC/Hgb/Hct/Plts:  21.7, 21.1/15.7, 15.6/45.4, 46.6/467, 490  (07/19 1502). Na+ 131, CO2 20, glucose 144, BUN 34, otherwise unremarkable CMP. Other Labs: Baseline procalcitonin <0.10, TSH 8.150, Lactate: 5.3>3.9, hcg 64. UA: Positive for UTI, Pregnancy, Urine POSITIVE. Electrocardiography (EKG) showed sinus tachycardia rate of 123 normal axis no acute ST-T wave changes . chest X-ray revealed 4.8 cm mass within the right upper lobe. CTA Chest: Spiculated 4.7 cm right upper lobe mass consistent with malignancy. Diffuse interlobular septal thickening throughout the right lung concerning for lymphangitic spread of disease.2. Large pericardial effusion. 3. Lymphadenopathy within the mediastinum, right supraclavicular region, and left axilla, concerning for metastatic disease.  Given abnormal  findings as above, cardiology was consulted who subsequently performed a bedside echocardiography (ECHO) showing cardiac tamponade physiology.Pulses paradoxus was also abnormal at 20-30 mm Hg. Due to progressive symptoms and findings as above, patient was taken to the cath lab urgently for pericardiocentesis and pericardial drain placement. 500cc of serosanguinous fluid was removed which improved the patient's symptoms remarkably. PCCM consulted for admission to the ICU.  Pertinent  Medical History  Graves Disease  Micro Data:  7/19: SARS-CoV-2 PCR>> negative 7/19: Influenza PCR>> negative 7/19: Blood culture x2>> 7/19: Urine Culture>> 7/19: MRSA PCR>> negative 7/19: Pericardial fluid>>  Antimicrobials:  Cefepime 7/19>>7/19 Ceftriaxone 7/20>>  Consults:  Cardiology Hematology/Oncology  Significant Diagnostic Tests:  7/19: Renal Ultrasound: Negative 7/19: CXR: 4.8 cm mass within the right upper lobe 7/19: CTA Chest :Spiculated 4.7 cm right upper lobe mass consistent with malignancy. Diffuse interlobular septal thickening throughout the right lung concerning for lymphangitic spread of disease.2. Large pericardial effusion. 3. Lymphadenopathy within the mediastinum, right  supraclavicular region, and left axilla, concerning for metastatic disease  Significant Hospital Events: Including procedures, antibiotic start  and stop dates in addition to other pertinent events   7/19: Admitted to ICU with pericardial effusion and cardiac tamponade s/p pericardiocentesis and pericardial drain placement. 7/20: Oncology consulted, hemodynamically stable, plan to transfer out of ICU  Interim History / Subjective:  -S/p Pericardial drain placement yesterday ~ cultures and cytology reports still pending -Afebrile, hemodynamically stable, no vasopressors, on 2L [place] -Pt reports she is feeling better -Oncology consulted -Ceftriaxone added for urinalysis consistent with UTI   Objective   Blood pressure 133/89, pulse (!) 101, temperature 98.3 F (36.8 C), resp. rate (!) 22, weight 58.4 kg, SpO2 96 %.        Intake/Output Summary (Last 24 hours) at 03/28/2021 0931 Last data filed at 03/28/2021 0600 Gross per 24 hour  Intake --  Output 230 ml  Net -230 ml   Filed Weights   03/28/21 0500  Weight: 58.4 kg    Examination: General: Acutely ill-appearing female, sitting in bed, on 2 L nasal cannula, no acute distress HENT: Atraumatic, normocephalic, neck supple, no JVD, mucous membranes moist Lungs: Coarse breath sounds bilaterally, even, non-labored Cardiovascular: Tachycardia, regular rhythm (ST on telemetry), s1s2, no M/R/G Abdomen: Soft, nontender, nondistended, no guarding or rebound tenderness Extremities: Normal bulk and tone, no deformities, no edema Neuro: Awake, alert, oriented x4, follows commands, no focal deficits, speech clear GU: deferred  Resolved Hospital Problem list     Assessment & Plan:   Pericardial effusion with cardiac tamponade  in the setting of suspected Lung malignancy (possible lung adenocarcinoma) in patient with hx of smoking since age 28. CT chest shows spiculated 4.7 cm right upper lobe mass consistent with malignancy with mets S/p  emergent pericardiocentesis with removal of 400 ml of hemorrhagic pericardial fluid  & pericardial  drainage tube placement -Supplemental O2 as needed to maintain O2 saturations 88 to 92% -Follow intermittent ABG and chest x-ray as needed -Fluid analysis and cytology of pericardial fluid pending -As needed bronchodilators -Encourage smoking cessation -Cardiology following, input appreciated -Oncology consulted, appreciate input -Discussed with Dr. Patsey Berthold of Pulmonology, she recommends awaiting cytology results of pericardial fluid, and if inconclusive, she can be set up with Dr. Patsey Berthold as outpatient for EBUS   Sepsis with septic shock due to UTI  Lactic: 5.3, Baseline PCT: 0.10, UA: Positive for UTI Initial interventions/workup included: 1 L of LR & Cefepime -Monitor fever curve -Trend WBC's & Procalcitonin -Follow cultures as above -Continue empiric Ceftriaxone pending cultures & sensitivities   Elevated Hcg Level and Positive Urine pregnancy test Elevated/abnormal results likely in the setting of suspected malignancy in a patient with hx of tubal ligation and post-menopausal. -Oncology consult as above   Graves Disease TSH elevated 8.1 repeat 3.2 ~ 3.0 -Free T4 slightly elevated at 1.22  Best Practice (right click and "Reselect all SmartList Selections" daily)   Diet/type: Regular consistency (see orders) DVT prophylaxis: SCD GI prophylaxis: N/A Lines: N/A Foley:  N/A Code Status:  full code Last date of multidisciplinary goals of care discussion [03/28/21]  Pt and her family updated at bedside by both myself and Dr. Jonnie Finner 03/28/21  Labs   CBC: Recent Labs  Lab 03/27/21 1502 03/28/21 0131  WBC 21.1*  21.7* 20.3*  NEUTROABS 15.1*  --   HGB 15.6*  15.7* 13.8  HCT 46.6*  45.4 40.5  MCV 88.4  88.0 87.7  PLT 490*  467* 025    Basic Metabolic Panel: Recent Labs  Lab 03/27/21 1502 03/28/21 0131  NA 131* 135  K 4.5 4.1  CL 98 104  CO2 20* 23  GLUCOSE  144* 120*  BUN 34* 22*  CREATININE 0.81 0.58  CALCIUM 9.4 8.6*  MG  --  2.2  PHOS  --  3.3   GFR: CrCl cannot be calculated (Unknown ideal weight.). Recent Labs  Lab 03/27/21 1502 03/27/21 1701 03/27/21 1751 03/27/21 2216 03/28/21 0131  PROCALCITON <0.10  --   --   --   --   WBC 21.1*  21.7*  --   --   --  20.3*  LATICACIDVEN  --  5.3* 3.9* 2.5* 1.7    Liver Function Tests: Recent Labs  Lab 03/27/21 1635  AST 31  ALT 21  ALKPHOS 74  BILITOT 1.2  PROT 7.7  ALBUMIN 3.2*   No results for input(s): LIPASE, AMYLASE in the last 168 hours. No results for input(s): AMMONIA in the last 168 hours.  ABG No results found for: PHART, PCO2ART, PO2ART, HCO3, TCO2, ACIDBASEDEF, O2SAT   Coagulation Profile: Recent Labs  Lab 03/27/21 1701  INR 1.1    Cardiac Enzymes: Recent Labs  Lab 03/27/21 1635  CKTOTAL 15*    HbA1C: No results found for: HGBA1C  CBG: No results for input(s): GLUCAP in the last 168 hours.  Review of Systems:   Positives in BOLD: Pt currently denies all complaints Gen: Denies fever, chills, weight change, fatigue, night sweats HEENT: Denies blurred vision, double vision, hearing loss, tinnitus, sinus congestion, rhinorrhea, sore throat, neck stiffness, dysphagia PULM: Denies shortness of breath, cough, sputum production, hemoptysis, wheezing CV: Denies chest pain, edema, orthopnea, paroxysmal nocturnal dyspnea, palpitations GI: Denies abdominal pain, nausea, vomiting, diarrhea, hematochezia, melena, constipation, change in bowel habits GU: Denies dysuria, hematuria, polyuria, oliguria, urethral discharge Endocrine: Denies hot or cold intolerance, polyuria, polyphagia or appetite change Derm: Denies rash, dry skin, scaling or peeling skin change Heme: Denies easy bruising, bleeding, bleeding gums Neuro: Denies headache, numbness, weakness, slurred speech, loss of memory or consciousness   Past Medical History:  She,  has a past medical history  of Graves disease.   Surgical History:   Past Surgical History:  Procedure Laterality Date   PERICARDIOCENTESIS N/A 03/27/2021   Procedure: PERICARDIOCENTESIS;  Surgeon: Nelva Bush, MD;  Location: [facility];  Service: Cardiovascular;  Laterality: N/A;   TUBAL LIGATION       Social History:   reports that she has been smoking cigarettes. She has a 40.00 pack-year smoking history. She has never used smokeless tobacco. She reports current drug use. Drug: Marijuana. She reports that she does not drink alcohol.   Family History:  Her family history includes Peripheral Artery Disease in her mother.   Allergies Allergies  Allergen Reactions   Metoprolol    Tapazole [Methimazole]      Home Medications  Prior to Admission medications   Medication Sig Start Date End Date Taking? Authorizing Provider  albuterol (PROVENTIL HFA;VENTOLIN HFA) 108 (90 Base) MCG/ACT inhaler Inhale 2 puffs into the lungs every 6 (six) hours as needed. Patient not taking: Reported on 03/27/2021    [provider]  Multiple Vitamin (MULTI-VITAMINS) TABS Take 1 tablet by mouth daily. Patient not taking: Reported on 03/27/2021    [provider]     Critical care time: 40 minutes     Darel Hong, AGACNP-BC [facility] epic messenger for cross cover needs If after hours, please call E-link

## 2021-03-28 NOTE — Progress Notes (Signed)
  Chaplain On-Call provided follow-up visit upon referral from [place].  Chaplain met patient and her family at bedside. Patient was eating breakfast after not having eaten in several days. All were encouraged about this.  Patient stated that she is receiving care procedures for draining excess fluid around her heart.  Chaplain provided spiritual and emotional support and prayer.  Chaplain Pollyann Samples M.Div., [facility]

## 2021-03-28 NOTE — Progress Notes (Signed)
Progress Note  Patient Name: Nichole Park Date of Encounter: 03/28/2021  [facility] Cardiologist: Dr. Saunders Revel  Subjective   Shortness of breath improved, but still present.  Has chest discomfort with coughing.  Inpatient Medications    Scheduled Meds:  Chlorhexidine Gluconate Cloth  6 each Topical Q0600   nystatin  5 mL Oral QID   ondansetron (ZOFRAN) IV  4 mg Intravenous Once   Continuous Infusions:  cefTRIAXone (ROCEPHIN)  IV 1 g (03/28/21 1100)   PRN Meds: acetaminophen, docusate sodium, polyethylene glycol   Vital Signs    Vitals:   03/28/21 0700 03/28/21 0800 03/28/21 0900 03/28/21 1000  BP:   104/81 129/85  Pulse: (!) 106 (!) 102 (!) 103 (!) 105  Resp: (!) 27 (!) 24 (!) 27 (!) 29  Temp:   97.8 F (36.6 C)   TempSrc:   Oral   SpO2: 94% 96% 93% 94%  Weight:        Intake/Output Summary (Last 24 hours) at 03/28/2021 1140 Last data filed at 03/28/2021 0900 Gross per 24 hour  Intake --  Output 230 ml  Net -230 ml   Last 3 Weights 03/28/2021 03/19/2017  Weight (lbs) 128 lb 12 oz 124 lb  Weight (kg) 58.4 kg 56.246 kg      Telemetry    Sinus tachycardia, heart rate 104- Personally Reviewed  ECG    No new tracing reviewed- Personally Reviewed  Physical Exam   GEN: Mild chest discomfort with cough Neck: No JVD Cardiac: RRR, no murmurs, rubs, or gallops.  Pericardial drain noted Respiratory: Diminished breath sounds at bases GI: Soft, nontender, non-distended  MS: No edema; No deformity. Neuro:  Nonfocal  Psych: Normal affect   Labs    High Sensitivity Troponin:  No results for input(s): TROPONINIHS in the last 720 hours.    Chemistry Recent Labs  Lab 03/27/21 1502 03/27/21 1635 03/28/21 0131  NA 131*  --  135  K 4.5  --  4.1  CL 98  --  104  CO2 20*  --  23  GLUCOSE 144*  --  120*  BUN 34*  --  22*  CREATININE 0.81  --  0.58  CALCIUM 9.4  --  8.6*  PROT  --  7.7  --   ALBUMIN  --  3.2*  --   AST  --  31  --   ALT  --  21  --    ALKPHOS  --  74  --   BILITOT  --  1.2  --   GFRNONAA >60  --  >60  ANIONGAP 13  --  8     Hematology Recent Labs  Lab 03/27/21 1502 03/28/21 0131  WBC 21.1*  21.7* 20.3*  RBC 5.27*  5.16* 4.62  HGB 15.6*  15.7* 13.8  HCT 46.6*  45.4 40.5  MCV 88.4  88.0 87.7  MCH 29.6  30.4 29.9  MCHC 33.5  34.6 34.1  RDW 12.8  12.7 12.6  PLT 490*  467* 377    BNPNo results for input(s): BNP, PROBNP in the last 168 hours.   DDimer No results for input(s): DDIMER in the last 168 hours.   Radiology    DG Chest 1 View  Result Date: 03/27/2021 CLINICAL DATA:  Shortness of breath EXAM: CHEST  1 VIEW COMPARISON:  None. FINDINGS: Cardiomegaly. 4.8 cm mass projects within the right upper lobe. Mildly hyperexpanded lungs with coarsened interstitial markings bilaterally. No pleural effusion or pneumothorax. No  acute bony findings. IMPRESSION: 1. 4.8 cm mass within the right upper lobe. CT chest with contrast is recommended for further evaluation. 2. Cardiomegaly. Electronically Signed   By: Davina Poke D.O.   On: 03/27/2021 15:52   CT Angio Chest PE W and/or Wo Contrast  Result Date: 03/27/2021 CLINICAL DATA:  Tachycardia, short of breath, pleuritic chest pain, abnormal chest x-ray EXAM: CT ANGIOGRAPHY CHEST WITH CONTRAST TECHNIQUE: Multidetector CT imaging of the chest was performed using the standard protocol during bolus administration of intravenous contrast. Multiplanar CT image reconstructions and MIPs were obtained to evaluate the vascular anatomy. CONTRAST:  164mL OMNIPAQUE IOHEXOL 350 MG/ML SOLN COMPARISON:  03/27/2021 FINDINGS: Cardiovascular: This is a technically adequate evaluation of the pulmonary vasculature. No filling defects or pulmonary emboli. There is a large pericardial effusion measuring up to 2.3 cm in thickness. Uniform decreased attenuation suggest transudative effusion. Normal caliber of the thoracic aorta. Mild atherosclerosis of the aorta and coronary vasculature.  Mediastinum/Nodes: Pathologically enlarged lymph nodes are seen within the mediastinum, measuring up to 13 mm in short axis reference image 24/4. Adenopathy is also seen within the right supraclavicular region and left axilla. Thyroid, trachea, and esophagus are grossly unremarkable. Lungs/Pleura: There is a large spiculated right upper lobe mass corresponding to the chest x-ray finding, measuring 4.7 x 3.8 by 4.0 cm. Diffuse interlobular septal thickening throughout the right lung concerning for lymphangitic spread of disease. There is background emphysema. No effusion or pneumothorax. Central airways are patent. Upper Abdomen: No acute abnormality. Musculoskeletal: No acute or destructive bony lesions. Reconstructed images demonstrate no additional findings. Review of the MIP images confirms the above findings. IMPRESSION: 1. 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 Diffuse interlobular septal thickening throughout the right lung concerning for lymphangitic spread of disease. 2. Large pericardial effusion. 3. Lymphadenopathy within the mediastinum, right supraclavicular region, and left axilla, concerning for metastatic disease. 4. No evidence of pulmonary embolus. 5. Aortic Atherosclerosis (ICD10-I70.0) and Emphysema (ICD10-J43.9). Electronically Signed   By: Randa Ngo M.D.   On: 03/27/2021 18:24   CARDIAC CATHETERIZATION  Result Date: 03/27/2021 Conclusions: Successful pericardiocentesis and tube pericardiotomy via the subxiphoid approach, yielding 500 mL of serosanginous fluid. Recommendations: Admit to ICU. Obtain STAT portable CXR. Follow-up fluid analyses, including cytology. Repeat echocardiogram tomorrow.  Anticipate removal of drain when output is less than 50-100 mL/24 hours and minimal residual fluid is present by echo. Nelva Bush, MD [facility]  US Renal  Result Date: 03/27/2021 CLINICAL DATA:  UTI EXAM: RENAL / URINARY TRACT ULTRASOUND COMPLETE  COMPARISON:  CT 03/27/2021 FINDINGS: Right Kidney: Renal measurements: 10.9 x 4 x 4.4 cm = volume: 101 mL. Echogenicity within normal limits. No mass or hydronephrosis visualized. Left Kidney: Renal measurements: 11.1 x 4.9 x 5.8 cm = volume: 165 mL. Echogenicity within normal limits. No mass or hydronephrosis visualized. Bladder: Appears normal for degree of bladder distention. Other: None. IMPRESSION: Negative renal ultrasound Electronically Signed   By: Donavan Foil M.D.   On: 03/27/2021 19:37   DG Chest Port 1 View  Result Date: 03/27/2021 CLINICAL DATA:  Cardiac tamponade EXAM: PORTABLE CHEST 1 VIEW COMPARISON:  Chest x-ray 03/27/2021, CT chest 03/27/2021 FINDINGS: Right upper lobe lung mass redemonstrated. Emphysema with bronchitic changes. Cardiomegaly. Interim placement of drainage catheter over the heart presumably a pericardial drainage catheter. Cardiac silhouette decreased compared to prior. Slight increased interstitial opacity possibly superimposed edema. No pneumothorax IMPRESSION: 1. Placement of presumed pericardial drainage catheter with decreased cardiomegaly 2. Emphysema  with chronic bronchitic change, slight increased interstitial opacity may reflect superimposed mild edema 3. Redemonstrated spiculated lung mass at the right apex Electronically Signed   By: Donavan Foil M.D.   On: 03/27/2021 22:28    Cardiac Studies   Echocardiogram pending  Patient Profile     60 y.o. female with history of Graves' disease, smoker x40+ years presenting with shortness of breath, found to have a large pericardial effusion, right upper lobe lung mass suspicious for malignancy.  Assessment & Plan    Pericardial effusion -Likely secondary to malignancy in light of right upper lobe lung mass. -S/p pericardial drain. -Monitor output .  Remove for less than 50 to 100 cc per 24 hours output -Evaluate cytology -Echo performed earlier this a.m., will review  2.  Emphysema, lung mass -Management  as per critical care team  3.  Smoking -Cessation advised  Total encounter time more 35 minutes  Greater than 50% was spent in counseling and coordination of care with the patient    Signed, Kate Sable, MD  03/28/2021, 11:40 AM

## 2021-03-28 NOTE — Progress Notes (Signed)
PHARMACY CONSULT NOTE - FOLLOW UP  Pharmacy Consult for Electrolyte Monitoring and Replacement   Recent Labs: Potassium (mmol/L)  Date Value  03/28/2021 4.1  12/01/2013 3.7   Magnesium (mg/dL)  Date Value  03/28/2021 2.2   Calcium (mg/dL)  Date Value  03/28/2021 8.6 (L)   Calcium, Total (mg/dL)  Date Value  12/01/2013 8.8   Albumin (g/dL)  Date Value  03/27/2021 3.2 (L)  12/01/2013 3.7   Phosphorus (mg/dL)  Date Value  03/28/2021 3.3   Sodium (mmol/L)  Date Value  03/28/2021 135  12/01/2013 137     Assessment: 60 y.o. female with a hx of Graves disease who reports a 3-week history of worsening shortness of breath, generalized malaise and weakness as well as intermittent bilateral lower chest pain. Pharmacy has been consulted for electrolyte management.  Goal of Therapy:  Electrolytes WNL  Plan:  No replacement indicated Follow up electrolytes as needed  Tawnya Crook ,PharmD Clinical Pharmacist 03/28/2021 11:53 AM

## 2021-03-28 NOTE — Progress Notes (Signed)
*  PRELIMINARY RESULTS* Echocardiogram 2D Echocardiogram has been performed.  Nichole Park 03/28/2021, 9:27 AM

## 2021-03-29 ENCOUNTER — Inpatient Hospital Stay: Payer: Medicaid Other

## 2021-03-29 ENCOUNTER — Inpatient Hospital Stay (HOSPITAL_COMMUNITY)
Admit: 2021-03-29 | Discharge: 2021-03-29 | Disposition: A | Discharge status: Home / Self Care | Payer: Medicaid Other | Attending: Medical | Admitting: Medical

## 2021-03-29 DIAGNOSIS — R531 Weakness: Secondary | ICD-10-CM

## 2021-03-29 DIAGNOSIS — E0590 Thyrotoxicosis, unspecified without thyrotoxic crisis or storm: Secondary | ICD-10-CM | POA: Insufficient documentation

## 2021-03-29 DIAGNOSIS — R7989 Other specified abnormal findings of blood chemistry: Secondary | ICD-10-CM

## 2021-03-29 DIAGNOSIS — R0600 Dyspnea, unspecified: Secondary | ICD-10-CM

## 2021-03-29 DIAGNOSIS — I314 Cardiac tamponade: Secondary | ICD-10-CM

## 2021-03-29 DIAGNOSIS — R000 Tachycardia, unspecified: Secondary | ICD-10-CM

## 2021-03-29 DIAGNOSIS — I313 Pericardial effusion (noninflammatory): Secondary | ICD-10-CM

## 2021-03-29 DIAGNOSIS — I4891 Unspecified atrial fibrillation: Secondary | ICD-10-CM

## 2021-03-29 LAB — URINE CULTURE: Culture: <10000 — AB

## 2021-03-29 LAB — BLOOD GAS, VENOUS
Acid-Base Excess: 4.9 mmol/L — ABNORMAL HIGH (ref 0.0–2.0)
Bicarbonate: 28.1 mmol/L — ABNORMAL HIGH (ref 20.0–28.0)
O2 Saturation: 95.1 %
Patient temperature: 37
pCO2, Ven: 36 mmHg — ABNORMAL LOW (ref 44.0–60.0)
pH, Ven: 7.5 — ABNORMAL HIGH (ref 7.250–7.430)
pO2, Ven: 69 mmHg — ABNORMAL HIGH (ref 32.0–45.0)

## 2021-03-29 LAB — BASIC METABOLIC PANEL
Anion gap: 8 (ref 5–15)
BUN: 10 mg/dL (ref 6–20)
CO2: 25 mmol/L (ref 22–32)
Calcium: 8.7 mg/dL — ABNORMAL LOW (ref 8.9–10.3)
Chloride: 106 mmol/L (ref 98–111)
Creatinine, Ser: 0.4 mg/dL — ABNORMAL LOW (ref 0.44–1.00)
GFR, Estimated: >60 mL/min (ref >60)
Glucose, Bld: 113 mg/dL — ABNORMAL HIGH (ref 70–99)
Potassium: 3.6 mmol/L (ref 3.5–5.1)
Sodium: 139 mmol/L (ref 135–145)

## 2021-03-29 LAB — HEMOGLOBIN A1C
Hgb A1c MFr Bld: 6.2 % — ABNORMAL HIGH (ref 4.8–5.6)
Mean Plasma Glucose: 131.24 mg/dL

## 2021-03-29 LAB — ACID FAST SMEAR (AFB, MYCOBACTERIA): Acid Fast Smear: NEGATIVE

## 2021-03-29 LAB — ECHOCARDIOGRAM LIMITED
Height: 61.5 in
S' Lateral: 2.55 cm
Weight: 2183.44 oz

## 2021-03-29 LAB — T3, FREE: T3, Free: 2 pg/mL (ref 2.0–4.4)

## 2021-03-29 LAB — CBC
HCT: 43.7 % (ref 36.0–46.0)
Hemoglobin: 14.7 g/dL (ref 12.0–15.0)
MCH: 29.1 pg (ref 26.0–34.0)
MCHC: 33.6 g/dL (ref 30.0–36.0)
MCV: 86.5 fL (ref 80.0–100.0)
Platelets: 423 10*3/uL — ABNORMAL HIGH (ref 150–400)
RBC: 5.05 MIL/uL (ref 3.87–5.11)
RDW: 12.6 % (ref 11.5–15.5)
WBC: 16.7 10*3/uL — ABNORMAL HIGH (ref 4.0–10.5)
nRBC: 0 % (ref 0.0–0.2)

## 2021-03-29 LAB — GLUCOSE, BODY FLUID OTHER: Glucose, Body Fluid Other: 74 mg/dL

## 2021-03-29 LAB — PROCALCITONIN: Procalcitonin: <0.1 ng/mL

## 2021-03-29 LAB — LD, BODY FLUID (OTHER): LD, Body Fluid: 1561 IU/L

## 2021-03-29 MED ORDER — AMIODARONE IV BOLUS ONLY 150 MG/100ML
INTRAVENOUS | Status: AC
  Filled 2021-03-29: qty 100

## 2021-03-29 MED ORDER — HYDROCOD POLST-CPM POLST ER 10-8 MG/5ML PO SUER
5.0000 mL | Freq: Two times a day (BID) | ORAL | Status: DC | PRN
  Administered 2021-03-29 – 2021-04-04 (×8): 5 mL via ORAL
  Filled 2021-03-29 (×8): qty 5

## 2021-03-29 MED ORDER — AMIODARONE IV BOLUS ONLY 150 MG/100ML: 150.0000 mg | Freq: Once | INTRAVENOUS | Status: AC

## 2021-03-29 MED ORDER — LORAZEPAM 1 MG PO TABS
1.0000 mg | ORAL_TABLET | Freq: Four times a day (QID) | ORAL | Status: DC | PRN
  Administered 2021-03-29 (×2): 1 mg via ORAL
  Filled 2021-03-29 (×2): qty 1

## 2021-03-29 MED ORDER — ENSURE ENLIVE PO LIQD
237.0000 mL | Freq: Three times a day (TID) | ORAL | Status: DC
  Administered 2021-03-29 – 2021-04-05 (×9): 237 mL via ORAL

## 2021-03-29 MED ORDER — ENOXAPARIN SODIUM 40 MG/0.4ML IJ SOSY
40.0000 mg | PREFILLED_SYRINGE | INTRAMUSCULAR | Status: DC
  Administered 2021-03-29 – 2021-04-09 (×12): 40 mg via SUBCUTANEOUS
  Filled 2021-03-29 (×12): qty 0.4

## 2021-03-29 MED ORDER — AMIODARONE HCL IN DEXTROSE 360-4.14 MG/200ML-% IV SOLN
60.0000 mg/h | INTRAVENOUS | Status: DC
  Administered 2021-03-30 (×2): 30 mg/h via INTRAVENOUS
  Administered 2021-03-31 – 2021-04-02 (×7): 60 mg/h via INTRAVENOUS
  Filled 2021-03-29 (×10): qty 200

## 2021-03-29 MED ORDER — AMIODARONE HCL IN DEXTROSE 360-4.14 MG/200ML-% IV SOLN
INTRAVENOUS | Status: AC
  Administered 2021-03-29: 150 mg via INTRAVENOUS
  Filled 2021-03-29: qty 200

## 2021-03-29 MED ORDER — IPRATROPIUM BROMIDE 0.02 % IN SOLN
0.5000 mg | Freq: Three times a day (TID) | RESPIRATORY_TRACT | Status: DC
  Administered 2021-03-29 – 2021-04-02 (×10): 0.5 mg via RESPIRATORY_TRACT
  Filled 2021-03-29 (×11): qty 2.5

## 2021-03-29 MED ORDER — LEVALBUTEROL HCL 0.63 MG/3ML IN NEBU
0.6300 mg | INHALATION_SOLUTION | Freq: Three times a day (TID) | RESPIRATORY_TRACT | Status: DC
  Administered 2021-03-29 – 2021-04-02 (×10): 0.63 mg via RESPIRATORY_TRACT
  Filled 2021-03-29 (×11): qty 3

## 2021-03-29 MED ORDER — DILTIAZEM HCL-DEXTROSE 125-5 MG/125ML-% IV SOLN (PREMIX)
5.0000 mg/h | INTRAVENOUS | Status: DC
  Administered 2021-03-29: 5 mg/h via INTRAVENOUS
  Administered 2021-03-29: 15 mg/h via INTRAVENOUS
  Filled 2021-03-29 (×2): qty 125

## 2021-03-29 MED ORDER — AMIODARONE HCL IN DEXTROSE 360-4.14 MG/200ML-% IV SOLN
60.0000 mg/h | INTRAVENOUS | Status: DC
  Administered 2021-03-29 – 2021-03-30 (×3): 60 mg/h via INTRAVENOUS
  Filled 2021-03-29 (×2): qty 200

## 2021-03-29 MED ORDER — AMIODARONE LOAD VIA INFUSION
150.0000 mg | Freq: Once | INTRAVENOUS | Status: AC
  Filled 2021-03-29: qty 83.34

## 2021-03-29 MED ORDER — ADULT MULTIVITAMIN W/MINERALS CH
1.0000 | ORAL_TABLET | Freq: Every day | ORAL | Status: DC
  Administered 2021-03-30 – 2021-04-10 (×12): 1 via ORAL
  Filled 2021-03-29 (×13): qty 1

## 2021-03-29 NOTE — Progress Notes (Signed)
*  PRELIMINARY RESULTS* Echocardiogram 2D Echocardiogram has been performed.  Nichole Park 03/29/2021, 2:45 PM

## 2021-03-29 NOTE — Progress Notes (Addendum)
Progress Note  Patient Name: Nichole Park Date of Encounter: 03/29/2021  [facility] Cardiologist: None   Subjective   General malaise this morning, upset stomach, Telemetry showing episodes of atrial fibrillation rapid rate up to 180 bpm Reports not feeling as well as yesterday Did not eat much breakfast, not hungry, could not keep anything down Family at bedside  Inpatient Medications    Scheduled Meds:  Chlorhexidine Gluconate Cloth  6 each Topical Q0600   enoxaparin (LOVENOX) injection  40 mg Subcutaneous Q24H   feeding supplement  237 mL Oral TID BM   ipratropium-albuterol  3 mL Nebulization TID   ketorolac  30 mg Intravenous Once   mouth rinse  15 mL Mouth Rinse BID   [START ON 03/30/2021] multivitamin with minerals  1 tablet Oral Daily   nicotine  21 mg Transdermal Daily   nystatin  5 mL Oral QID   ondansetron (ZOFRAN) IV  4 mg Intravenous Once   Continuous Infusions:  diltiazem (CARDIZEM) infusion 5 mg/hr (03/29/21 1053)   PRN Meds: acetaminophen, docusate sodium, ketorolac, LORazepam, polyethylene glycol   Vital Signs    Vitals:   03/29/21 1100 03/29/21 1200 03/29/21 1300 03/29/21 1444  BP: (!) 136/104 (!) 123/97 (!) 137/91   Pulse: (!) 119 (!) 115 (!) 116 (!) 118  Resp: (!) 29 (!) 25 (!) 29 (!) 30  Temp:  98.5 F (36.9 C)    TempSrc:  Axillary    SpO2: 96% 95% 94% 93%  Weight:      Height:        Intake/Output Summary (Last 24 hours) at 03/29/2021 1633 Last data filed at 03/29/2021 0500 Gross per 24 hour  Intake --  Output 650 ml  Net -650 ml   Last 3 Weights 03/29/2021 03/28/2021 03/19/2017  Weight (lbs) 136 lb 7.4 oz 128 lb 12 oz 124 lb  Weight (kg) 61.9 kg 58.4 kg 56.246 kg      Telemetry    Normal sinus rhythm, episodes of atrial fibrillation rate up to 180 bpm- Personally Reviewed  ECG    - Personally Reviewed  Physical Exam   GEN: No acute distress.   Neck: No JVD Cardiac: RRR, no murmurs, rubs, or gallops.  Respiratory: Clear  to auscultation bilaterally. GI: Soft, nontender, non-distended  MS: No edema; No deformity. Neuro:  Nonfocal  Psych: Normal affect   Labs    High Sensitivity Troponin:  No results for input(s): TROPONINIHS in the last 720 hours.    Chemistry Recent Labs  Lab 03/27/21 1502 03/27/21 1635 03/28/21 0131 03/29/21 0413  NA 131*  --  135 139  K 4.5  --  4.1 3.6  CL 98  --  104 106  CO2 20*  --  23 25  GLUCOSE 144*  --  120* 113*  BUN 34*  --  22* 10  CREATININE 0.81  --  0.58 0.40*  CALCIUM 9.4  --  8.6* 8.7*  PROT  --  7.7  --   --   ALBUMIN  --  3.2*  --   --   AST  --  31  --   --   ALT  --  21  --   --   ALKPHOS  --  74  --   --   BILITOT  --  1.2  --   --   GFRNONAA >60  --  >60 >60  ANIONGAP 13  --  8 8     Hematology Recent  Labs  Lab 03/27/21 1502 03/28/21 0131 03/29/21 0413  WBC 21.1*  21.7* 20.3* 16.7*  RBC 5.27*  5.16* 4.62 5.05  HGB 15.6*  15.7* 13.8 14.7  HCT 46.6*  45.4 40.5 43.7  MCV 88.4  88.0 87.7 86.5  MCH 29.6  30.4 29.9 29.1  MCHC 33.5  34.6 34.1 33.6  RDW 12.8  12.7 12.6 12.6  PLT 490*  467* 377 423*    BNPNo results for input(s): BNP, PROBNP in the last 168 hours.   DDimer No results for input(s): DDIMER in the last 168 hours.   Radiology    CT Angio Chest PE W and/or Wo Contrast  Result Date: 03/27/2021 CLINICAL DATA:  Tachycardia, short of breath, pleuritic chest pain, abnormal chest x-ray EXAM: CT ANGIOGRAPHY CHEST WITH CONTRAST TECHNIQUE: Multidetector CT imaging of the chest was performed using the standard protocol during bolus administration of intravenous contrast. Multiplanar CT image reconstructions and MIPs were obtained to evaluate the vascular anatomy. CONTRAST:  163mL OMNIPAQUE IOHEXOL 350 MG/ML SOLN COMPARISON:  03/27/2021 FINDINGS: Cardiovascular: This is a technically adequate evaluation of the pulmonary vasculature. No filling defects or pulmonary emboli. There is a large pericardial effusion measuring up to 2.3 cm  in thickness. Uniform decreased attenuation suggest transudative effusion. Normal caliber of the thoracic aorta. Mild atherosclerosis of the aorta and coronary vasculature. Mediastinum/Nodes: Pathologically enlarged lymph nodes are seen within the mediastinum, measuring up to 13 mm in short axis reference image 24/4. Adenopathy is also seen within the right supraclavicular region and left axilla. Thyroid, trachea, and esophagus are grossly unremarkable. Lungs/Pleura: There is a large spiculated right upper lobe mass corresponding to the chest x-ray finding, measuring 4.7 x 3.8 by 4.0 cm. Diffuse interlobular septal thickening throughout the right lung concerning for lymphangitic spread of disease. There is background emphysema. No effusion or pneumothorax. Central airways are patent. Upper Abdomen: No acute abnormality. Musculoskeletal: No acute or destructive bony lesions. Reconstructed images demonstrate no additional findings. Review of the MIP images confirms the above findings. IMPRESSION: 1. 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 Diffuse interlobular septal thickening throughout the right lung concerning for lymphangitic spread of disease. 2. Large pericardial effusion. 3. Lymphadenopathy within the mediastinum, right supraclavicular region, and left axilla, concerning for metastatic disease. 4. No evidence of pulmonary embolus. 5. Aortic Atherosclerosis (ICD10-I70.0) and Emphysema (ICD10-J43.9). Electronically Signed   By: Randa Ngo M.D.   On: 03/27/2021 18:24   CARDIAC CATHETERIZATION  Result Date: 03/27/2021 Conclusions: Successful pericardiocentesis and tube pericardiotomy via the subxiphoid approach, yielding 500 mL of serosanginous fluid. Recommendations: Admit to ICU. Obtain STAT portable CXR. Follow-up fluid analyses, including cytology. Repeat echocardiogram tomorrow.  Anticipate removal of drain when output is less than 50-100 mL/24 hours and minimal residual  fluid is present by echo. Nelva Bush, MD [facility]  US Renal  Result Date: 03/27/2021 CLINICAL DATA:  UTI EXAM: RENAL / URINARY TRACT ULTRASOUND COMPLETE COMPARISON:  CT 03/27/2021 FINDINGS: Right Kidney: Renal measurements: 10.9 x 4 x 4.4 cm = volume: 101 mL. Echogenicity within normal limits. No mass or hydronephrosis visualized. Left Kidney: Renal measurements: 11.1 x 4.9 x 5.8 cm = volume: 165 mL. Echogenicity within normal limits. No mass or hydronephrosis visualized. Bladder: Appears normal for degree of bladder distention. Other: None. IMPRESSION: Negative renal ultrasound Electronically Signed   By: Donavan Foil M.D.   On: 03/27/2021 19:37   DG Chest Port 1 View  Result Date: 03/29/2021 CLINICAL DATA:  Shortness of breath  and chest pain EXAM: PORTABLE CHEST 1 VIEW COMPARISON:  03/27/2021 FINDINGS: Cardiac shadow is mildly enlarged but stable. Spiculated right upper lobe mass lesion is again noted. Mild increase in parenchymal edema is seen. Small bore catheter is again noted over the cardiac shadow likely related to a pericardial drain. No pneumothorax is noted. IMPRESSION: Increasing edema particularly on the right. Stable right upper lobe mass lesion. No new focal abnormality is noted. Electronically Signed   By: Inez Catalina M.D.   On: 03/29/2021 09:02   DG Chest Port 1 View  Result Date: 03/27/2021 CLINICAL DATA:  Cardiac tamponade EXAM: PORTABLE CHEST 1 VIEW COMPARISON:  Chest x-ray 03/27/2021, CT chest 03/27/2021 FINDINGS: Right upper lobe lung mass redemonstrated. Emphysema with bronchitic changes. Cardiomegaly. Interim placement of drainage catheter over the heart presumably a pericardial drainage catheter. Cardiac silhouette decreased compared to prior. Slight increased interstitial opacity possibly superimposed edema. No pneumothorax IMPRESSION: 1. Placement of presumed pericardial drainage catheter with decreased cardiomegaly 2. Emphysema with chronic bronchitic change,  slight increased interstitial opacity may reflect superimposed mild edema 3. Redemonstrated spiculated lung mass at the right apex Electronically Signed   By: Donavan Foil M.D.   On: 03/27/2021 22:28   ECHOCARDIOGRAM COMPLETE  Result Date: 03/28/2021    ECHOCARDIOGRAM REPORT   Patient Name:   Nichole Park Date of Exam: 03/28/2021 Medical Rec #:  [id number]   Height:       61.5 in Accession #:    [id number]  Weight:       128.7 lb Date of Birth:  1961/04/13  BSA:          1.576 m Patient Age:    26 years    BP:           128/88 mmHg Patient Gender: F           HR:           105 bpm. Exam Location:  [facility] Procedure: 2D Echo, Color Doppler, Cardiac Doppler and Intracardiac            Opacification Agent Indications:     I31.4 Tamponade  History:         Patient has no prior history of Echocardiogram examinations.                  Signs/Symptoms:Shortness of Breath; Risk Factors:Current                  Smoker. Graves disease.  Sonographer:     Charmayne Sheer RDCS (AE) Referring Phys:  [id number] [address] [place] Diagnosing Phys: Kate Sable MD  Sonographer Comments: Technically difficult study due to poor echo windows. IMPRESSIONS  1. Left ventricular ejection fraction, by estimation, is 55%. The left ventricle has normal function. The left ventricle has no regional wall motion abnormalities. Left ventricular diastolic parameters are consistent with Grade II diastolic dysfunction (pseudonormalization).  2. Right ventricular systolic function is normal. The right ventricular size is normal.  3. The mitral valve is normal in structure. No evidence of mitral valve regurgitation.  4. The aortic valve was not well visualized. Aortic valve regurgitation is not visualized.  5. The inferior vena cava is dilated in size with >50% respiratory variability, suggesting right atrial pressure of 8 mmHg. FINDINGS  Left Ventricle: Left ventricular ejection fraction, by estimation, is 55%. The left ventricle has normal function. The left  ventricle has no regional wall motion abnormalities. Definity contrast agent was given IV to delineate the left ventricular endocardial  borders. The left ventricular internal cavity size was normal in size. There is no left ventricular hypertrophy. Left ventricular diastolic parameters are consistent with Grade II diastolic dysfunction (pseudonormalization). Right Ventricle: The right ventricular size is normal. No increase in right ventricular wall thickness. Right ventricular systolic function is normal. Left Atrium: Left atrial size was normal in size. Right Atrium: Right atrial size was normal in size. Pericardium: Trivial pericardial effusion is present. Mitral Valve: The mitral valve is normal in structure. No evidence of mitral valve regurgitation. Tricuspid Valve: The tricuspid valve is not well visualized. Tricuspid valve regurgitation is not demonstrated. Aortic Valve: The aortic valve was not well visualized. Aortic valve regurgitation is not visualized. Aortic valve mean gradient measures 2.0 mmHg. Aortic valve peak gradient measures 4.7 mmHg. Aortic valve area, by VTI measures 3.44 cm. Pulmonic Valve: The pulmonic valve was not well visualized. Pulmonic valve regurgitation is not visualized. Aorta: The aortic root is normal in size and structure. Venous: The inferior vena cava is dilated in size with greater than 50% respiratory variability, suggesting right atrial pressure of 8 mmHg. IAS/Shunts: No atrial level shunt detected by color flow Doppler.  LEFT VENTRICLE PLAX 2D LVIDd:         3.90 cm  Diastology LVIDs:         3.30 cm  LV e' medial:    9.46 cm/s LV PW:         1.20 cm  LV E/e' medial:  8.9 LV IVS:        0.90 cm  LV e' lateral:   4.57 cm/s LVOT diam:     2.20 cm  LV E/e' lateral: 18.4 LV SV:         54 LV SV Index:   34 LVOT Area:     3.80 cm  LEFT ATRIUM           Index LA diam:      3.70 cm 2.35 cm/m LA Vol (A4C): 38.0 ml 24.11 ml/m  AORTIC VALVE                   PULMONIC VALVE AV  Area (Vmax):    3.40 cm    PV Vmax:       0.97 m/s AV Area (Vmean):   3.66 cm    PV Vmean:      65.700 cm/s AV Area (VTI):     3.44 cm    PV VTI:        0.122 m AV Vmax:           108.00 cm/s PV Peak grad:  3.7 mmHg AV Vmean:          71.900 cm/s PV Mean grad:  2.0 mmHg AV VTI:            0.156 m AV Peak Grad:      4.7 mmHg AV Mean Grad:      2.0 mmHg LVOT Vmax:         96.60 cm/s LVOT Vmean:        69.300 cm/s LVOT VTI:          0.141 m LVOT/AV VTI ratio: 0.90  AORTA Ao Root diam: 2.80 cm MITRAL VALVE MV Area (PHT): 5.07 cm    SHUNTS MV Decel Time: 150 msec    Systemic VTI:  0.14 m MV E velocity: 84.23 cm/s  Systemic Diam: 2.20 cm MV A velocity: 88.23 cm/s MV E/A ratio:  0.95 Kate Sable MD Electronically signed  by Kate Sable MD Signature Date/Time: 03/28/2021/1:51:00 PM    Final    ECHOCARDIOGRAM LIMITED  Result Date: 03/29/2021    ECHOCARDIOGRAM LIMITED REPORT   Patient Name:   Nichole Park Date of Exam: 03/29/2021 Medical Rec #:  [id number]   Height:       61.5 in Accession #:    [id number]  Weight:       136.5 lb Date of Birth:  May 12, 1961  BSA:          1.615 m Patient Age:    60 years    BP:           137/91 mmHg Patient Gender: F           HR:           116 bpm. Exam Location:  [facility] Procedure: Cardiac Doppler, Color Doppler and Limited Echo Indications:     Pericardial Effusion I31.3  History:         Patient has prior history of Echocardiogram examinations, most                  recent 03/28/2021. Risk Factors:Hypertension. Cardiac tamponade.  Sonographer:     Sherrie Sport RDCS (AE) Referring Phys:  [id number] [place] Diagnosing Phys: Ida Rogue MD  Sonographer Comments: Technically difficult study due to poor echo windows. IMPRESSIONS  1. Trvial pericardial effusion.  2. Left ventricular ejection fraction, by estimation, is 60 to 65%. The left ventricle has normal function. The left ventricle has no regional wall motion abnormalities.  3. Right ventricular systolic function is  normal. The right ventricular size is normal.  4. The mitral valve is normal in structure. No evidence of mitral valve regurgitation. No evidence of mitral stenosis. FINDINGS  Left Ventricle: Left ventricular ejection fraction, by estimation, is 60 to 65%. The left ventricle has normal function. The left ventricle has no regional wall motion abnormalities. The left ventricular internal cavity size was normal in size. There is  no left ventricular hypertrophy. Right Ventricle: The right ventricular size is normal. No increase in right ventricular wall thickness. Right ventricular systolic function is normal. Left Atrium: Left atrial size was normal in size. Right Atrium: Right atrial size was normal in size. Pericardium: Trivial pericardial effusion is present. Mitral Valve: The mitral valve is normal in structure. No evidence of mitral valve stenosis. Tricuspid Valve: The tricuspid valve is normal in structure. Tricuspid valve regurgitation is not demonstrated. No evidence of tricuspid stenosis. Aortic Valve: The aortic valve was not well visualized. Aortic valve regurgitation is not visualized. No aortic stenosis is present. Pulmonic Valve: The pulmonic valve was normal in structure. Pulmonic valve regurgitation is not visualized. No evidence of pulmonic stenosis. Aorta: The aortic root is normal in size and structure. Venous: The inferior vena cava is normal in size with greater than 50% respiratory variability, suggesting right atrial pressure of 3 mmHg. IAS/Shunts: No atrial level shunt detected by color flow Doppler. LEFT VENTRICLE PLAX 2D LVIDd:         4.04 cm LVIDs:         2.55 cm LV PW:         1.31 cm LV IVS:        0.93 cm LVOT diam:     2.00 cm LVOT Area:     3.14 cm  LEFT ATRIUM           Index      RIGHT ATRIUM  Index LA diam:      3.10 cm 1.92 cm/m RA Area:     10.20 cm LA Vol (A4C): 11.2 ml 6.93 ml/m RA Volume:   23.00 ml  14.24 ml/m   AORTA Ao Root diam: 2.80 cm TRICUSPID VALVE TR  Peak grad:   4.8 mmHg TR Vmax:        110.00 cm/s  SHUNTS Systemic Diam: 2.00 cm Ida Rogue MD Electronically signed by Ida Rogue MD Signature Date/Time: 03/29/2021/3:32:05 PM    Final     Cardiac Studies  Echocardiogram performed today  1. Trvial pericardial effusion.   2. Left ventricular ejection fraction, by estimation, is 60 to 65%. The  left ventricle has normal function. The left ventricle has no regional  wall motion abnormalities.   3. Right ventricular systolic function is normal. The right ventricular  size is normal.   4. The mitral valve is normal in structure. No evidence of mitral valve  regurgitation. No evidence of mitral stenosis.    Patient Profile     60 y.o. female with history of Graves' disease, smoker x40+ years presenting with shortness of breath, found to have a large pericardial effusion, right upper lobe lung mass suspicious for malignancy.  Assessment & Plan    Pericardial effusion Etiology unclear though in the setting of malignancy right upper lobe mass -S/p pericardial drain, I will pull drain today after no significant output past 24 hours, repeat echocardiogram today discussed with her, no significant effusion -Sterile dressing placed -We will plan for outpatient follow-up, repeat echocardiogram   2.  Emphysema, lung mass -Management as per critical care team RUL lung malignancy with mediastinal metastasis.  She does have some mild chest discomfort Long smoking history 1 pack/day since age 66  3.  Smoking -Cessation advised Has been smoking since age [age] 4 pack/day  43.  Paroxysmal atrial fibrillation Pericardial drain has been since removed, episodes were short-lived but rapid at 180 bpm We have started diltiazem infusion this morning Currently on 10 mg/h Continues to have sinus tachycardia, will continue infusion overnight with transition to oral medications tomorrow Will avoid beta-blockers for now given underlying lung  disease, COPD, bronchospasm, self-reported possible issue with metoprolol    Total encounter time more than 35 minutes  Greater than 50% was spent in counseling and coordination of care with the patient   For questions or updates, please contact [place] Please consult [email or web address] for contact info under        Signed, Ida Rogue, MD  03/29/2021, 4:33 PM

## 2021-03-29 NOTE — Progress Notes (Signed)
This evening with narrow complex tachycardia consistent with atrial fibrillation, paroxysmal 6 PM With some associated hypotension, rates 180 and higher with general malaise  Discussed with nursing, Started amiodarone bolus 150 mg x 1 with infusion Converting to normal sinus rhythm  Recurrent tachycardia 7 PM Converting back to normal sinus rhythm  Patient appears somnolent arousable, very tired No distress Current rate 113 bpm, sinus tach  blood pressure 941 systolic Will recommend repeat amiodarone bolus over 30 minutes, continue diltiazem infusion 10 to 15 mg/h For hypotension can wean down on the diltiazem infusion,  continue amiodarone at 60 mg/h overnight Suspect likely driven by underlying lung pathology Will need full amiodarone load for paroxysmal atrial fibrillation  We will hold off on anticoagulation at this time given lung pathology Signed, Esmond Plants, MD, Ph.D [facility]

## 2021-03-29 NOTE — Progress Notes (Addendum)
PROGRESS NOTE    Nichole Park  KDT:[id number] DOB: [birth year] DOA: 03/27/2021 PCP: Pcp, No  Outpatient Specialists: none    Brief Narrative:   Hx graves disease, presenting with shortness of breath and chest discomfort, found to have large right upper lobar mass and pericardial effusion w/ tamponade physiology. S/p pericardial drain.    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 S/p pericardiocentesis with 400 ml drainage. Current drainage minimal. Remains tachypnic and SOB. CTA neg. She is a heavy smoker. TTE shows preserved EF, diastolic dysfunction. - for persistent sob will check vbg and cxr - f/u cytology and cultures - if fluid analysis unrevealing, Dr. Patsey Berthold of pulm advises close outpt f/u for EBUS - mgmt of effusion per cardiology who is following  # Acute cystitis # severe Sepsis Initially tachycardic, leukocytosis, elevated lactate. Urine suggestive of infection, culture pending. Lactate normalized - cont ceftriaxone for now.  # Graves Disease TSH wnl, T4 mildly elevated - outpt endo f/u  # Hyperglycemia Mild - f/u a1c   DVT prophylaxis: lovenox Code Status: full Family Communication: none @ bedside  Level of care: Stepdown Status is: Inpatient  Remains inpatient appropriate because:Inpatient level of care appropriate due to severity of illness  Dispo: The patient is from: Home              Anticipated d/c is to: Home              Patient currently is not medically stable to d/c.   Difficult to place patient No        Consultants:  Cardiology, oncology  Procedures: Pericardial drain placement  Antimicrobials:  Cefepime>ceftriaxone    Subjective: This morning  remains dyspneic, says unchanged. Mild chest discomfort. No fever, no n/v/d.  Objective: Vitals:   03/29/21 0400 03/29/21 0500 03/29/21 0600 03/29/21 0743  BP: (!) 131/92  138/88   Pulse: (!) 108 (!) 116 (!) 110 (!) 120  Resp: (!) 24 (!) 27 (!) 30 (!) 22  Temp:  99 F (37.2 C)    TempSrc:  Oral    SpO2: 94% 98% 91% 93%  Weight:  61.9 kg    Height:        Intake/Output Summary (Last 24 hours) at 03/29/2021 9983 Last data filed at 03/29/2021 0500 Gross per 24 hour  Intake 340 ml  Output 650 ml  Net -310 ml   Filed Weights   03/28/21 0500 03/29/21 0500  Weight: 58.4 kg 61.9 kg    Examination:  General exam: Tachypnic Respiratory system: decreased breath sounds right upper  Cardiovascular system: S1 & S2 heard, soft systolic murmur Gastrointestinal system: Abdomen is nondistended, soft and nontender. No organomegaly or masses felt. Normal bowel sounds heard. Central nervous system: Alert and oriented. No focal neurological deficits. Extremities: Symmetric 5 x 5 power. Skin: No rashes, lesions or ulcers Psychiatry: Judgement and insight appear normal. Mood & affect appropriate.     Data Reviewed: I have personally reviewed following labs and imaging studies  CBC: Recent Labs  Lab 03/27/21 1502 03/28/21 0131 03/29/21 0413  WBC 21.1*  21.7* 20.3* 16.7*  NEUTROABS 15.1*  --   --   HGB 15.6*  15.7* 13.8 14.7  HCT 46.6*  45.4 40.5 43.7  MCV 88.4  88.0 87.7 86.5  PLT 490*  467* 377 962*   Basic Metabolic Panel: Recent Labs  Lab 03/27/21 1502 03/28/21 0131 03/29/21 0413  NA 131* 135 139  K 4.5 4.1 3.6  CL 98 104 106  CO2 20* 23 25  GLUCOSE 144* 120* 113*  BUN 34* 22* 10  CREATININE 0.81 0.58 0.40*  CALCIUM 9.4 8.6* 8.7*  MG  --  2.2  --   PHOS  --  3.3  --    GFR: Estimated Creatinine Clearance: 66.4 mL/min (A) (by C-G formula based on SCr of 0.4 mg/dL (L)). Liver Function Tests: Recent Labs  Lab 03/27/21 1635  AST 31  ALT 21  ALKPHOS 74   BILITOT 1.2  PROT 7.7  ALBUMIN 3.2*   No results for input(s): LIPASE, AMYLASE in the last 168 hours. No results for input(s): AMMONIA in the last 168 hours. Coagulation Profile: Recent Labs  Lab 03/27/21 1701  INR 1.1   Cardiac Enzymes: Recent Labs  Lab 03/27/21 1635  CKTOTAL 15*   BNP (last 3 results) No results for input(s): PROBNP in the last 8760 hours. HbA1C: No results for input(s): HGBA1C in the last 72 hours. CBG: Recent Labs  Lab 03/27/21 2138  GLUCAP 101*   Lipid Profile: No results for input(s): CHOL, HDL, LDLCALC, TRIG, CHOLHDL, LDLDIRECT in the last 72 hours. Thyroid Function Tests: Recent Labs    03/28/21 0131  TSH 3.020  FREET4 1.22*  T3FREE 2.0   Anemia Panel: No results for input(s): VITAMINB12, FOLATE, FERRITIN, TIBC, IRON, RETICCTPCT in the last 72 hours. Urine analysis:    Component Value Date/Time   COLORURINE AMBER (A) 03/27/2021 1701   APPEARANCEUR CLOUDY (A) 03/27/2021 1701   APPEARANCEUR Clear 12/01/2013 1439   LABSPEC 1.028 03/27/2021 1701   LABSPEC 1.005 12/01/2013 1439   PHURINE 5.0 03/27/2021 1701   GLUCOSEU 50 (A) 03/27/2021 1701   GLUCOSEU Negative 12/01/2013 1439   HGBUR NEGATIVE 03/27/2021 1701   BILIRUBINUR SMALL (A) 03/27/2021 1701   BILIRUBINUR Negative 12/01/2013 1439   KETONESUR NEGATIVE 03/27/2021 1701   PROTEINUR 30 (A) 03/27/2021 1701   NITRITE NEGATIVE 03/27/2021 1701   LEUKOCYTESUR MODERATE (A) 03/27/2021 1701   LEUKOCYTESUR 1+ 12/01/2013 1439   Sepsis Labs: @LABRCNTIP (procalcitonin:4,lacticidven:4)  ) Recent Results (from the past 240 hour(s))  Blood culture (routine single)     Status: None (Preliminary result)   Collection Time: 03/27/21  5:01 PM   Specimen: BLOOD  Result Value Ref Range Status   Specimen Description BLOOD RIGHT ANTECUBITAL  Final   Special Requests   Final    BOTTLES DRAWN AEROBIC AND ANAEROBIC Blood Culture adequate volume   Culture   Final    NO GROWTH 2 DAYS Performed at  [facility], [address] [place], [place] [address]    Report Status PENDING  Incomplete  Resp Panel by RT-PCR (Flu A&B, Covid) Nasopharyngeal Swab     Status: None   Collection Time: 03/27/21  5:01 PM   Specimen: Nasopharyngeal Swab; Nasopharyngeal(NP) swabs in vial transport medium  Result Value Ref Range Status   SARS Coronavirus 2 by RT PCR NEGATIVE NEGATIVE Final    Comment: (NOTE) SARS-CoV-2 target nucleic acids are NOT DETECTED.  The SARS-CoV-2 RNA is generally detectable in upper respiratory specimens during the acute phase of infection. The lowest concentration of SARS-CoV-2 viral copies this assay can detect is 138 copies/mL. A negative result does not preclude SARS-Cov-2 infection and should  not be used as the sole basis for treatment or other patient management decisions. A negative result may occur with  improper specimen collection/handling, submission of specimen other than nasopharyngeal swab, presence of viral mutation(s) within the areas targeted by this assay, and inadequate number of viral copies(<138 copies/mL). A negative result must be combined with clinical observations, patient history, and epidemiological information. The expected result is Negative.  Fact Sheet for Patients:  EntrepreneurPulse.com.au  Fact Sheet for Healthcare Providers:  IncredibleEmployment.be  This test is no t yet approved or cleared by the [place] FDA and  has been authorized for detection and/or diagnosis of SARS-CoV-2 by FDA under an Emergency Use Authorization (EUA). This EUA will remain  in effect (meaning this test can be used) for the duration of the COVID-19 declaration under Section 564(b)(1) of the Act, 21 U.S.C.section 360bbb-3(b)(1), unless the authorization is terminated  or revoked sooner.       Influenza A by PCR NEGATIVE NEGATIVE Final   Influenza B by PCR NEGATIVE NEGATIVE Final    Comment: (NOTE) The  Xpert Xpress SARS-CoV-2/FLU/RSV plus assay is intended as an aid in the diagnosis of influenza from Nasopharyngeal swab specimens and should not be used as a sole basis for treatment. Nasal washings and aspirates are unacceptable for Xpert Xpress SARS-CoV-2/FLU/RSV testing.  Fact Sheet for Patients: EntrepreneurPulse.com.au  Fact Sheet for Healthcare Providers: IncredibleEmployment.be  This test is not yet approved or cleared by the [place] FDA and has been authorized for detection and/or diagnosis of SARS-CoV-2 by FDA under an Emergency Use Authorization (EUA). This EUA will remain in effect (meaning this test can be used) for the duration of the COVID-19 declaration under Section 564(b)(1) of the Act, 21 U.S.C. section 360bbb-3(b)(1), unless the authorization is terminated or revoked.  Performed at [facility], [address] [place], [place] [address]   Blood culture (single)     Status: None (Preliminary result)   Collection Time: 03/27/21  6:05 PM   Specimen: BLOOD  Result Value Ref Range Status   Specimen Description BLOOD LEFT ANTECUBITAL  Final   Special Requests   Final    BOTTLES DRAWN AEROBIC AND ANAEROBIC Blood Culture results may not be optimal due to an inadequate volume of blood received in culture bottles   Culture   Final    NO GROWTH 2 DAYS Performed at [facility], [address], [place], [place] [address]    Report Status PENDING  Incomplete  Body fluid culture w Gram Stain     Status: None (Preliminary result)   Collection Time: 03/27/21  8:26 PM   Specimen: Pericardial; Body Fluid  Result Value Ref Range Status   Specimen Description   Final    PERICARDIAL Performed at [facility], [address] [place], [place] [address]    Special Requests   Final    NONE Performed at [facility], [address] [place], [place] [address]    Gram Stain   Final    RARE WBC  PRESENT,BOTH PMN AND MONONUCLEAR NO ORGANISMS SEEN Performed at [facility], [place] [address], [place], [place] [address]    Culture PENDING  Incomplete   Report Status PENDING  Incomplete  Acid Fast Smear (AFB)     Status: None   Collection Time: 03/27/21  8:26 PM   Specimen: Pericardial Fluid  Result Value Ref Range Status   AFB Specimen Processing Concentration  Final   Acid Fast Smear Negative  Final    Comment: (NOTE) Performed At: [facility] [place], [place] [id number] Rush Farmer MD AC:[id number]    Source (AFB) FLUID  Final    Comment: Performed at [facility], [address] [place], [place] [address]  Culture, blood (routine x 2)     Status: None (Preliminary result)   Collection Time: 03/27/21 10:18 PM   Specimen: BLOOD RIGHT FOREARM  Result Value Ref Range Status   Specimen Description BLOOD RIGHT FOREARM  Final   Special Requests   Final    BOTTLES DRAWN AEROBIC AND ANAEROBIC Blood Culture adequate volume   Culture   Final    NO GROWTH 2 DAYS Performed at [facility], [address], [place], [place] [address]    Report Status PENDING  Incomplete  MRSA Next Gen by PCR, Nasal     Status: None   Collection Time: 03/27/21 11:01 PM   Specimen: Nasal Mucosa; Nasal Swab  Result Value Ref Range Status   MRSA by PCR Next Gen NOT DETECTED NOT DETECTED Final    Comment: (NOTE) The GeneXpert MRSA Assay (FDA approved for NASAL specimens only), is one component of a comprehensive MRSA colonization surveillance program. It is not intended to diagnose MRSA infection nor to guide or monitor treatment for MRSA infections. Test performance is not FDA approved in patients less than 81 years old. Performed at [facility], [address] [place], [place] [address]          Radiology Studies: DG Chest 1 View  Result Date: 03/27/2021 CLINICAL DATA:  Shortness of breath EXAM: CHEST  1 VIEW COMPARISON:   None. FINDINGS: Cardiomegaly. 4.8 cm mass projects within the right upper lobe. Mildly hyperexpanded lungs with coarsened interstitial markings bilaterally. No pleural effusion or pneumothorax. No acute bony findings. IMPRESSION: 1. 4.8 cm mass within the right upper lobe. CT chest with contrast is recommended for further evaluation. 2. Cardiomegaly. Electronically Signed   By: Davina Poke D.O.   On: 03/27/2021 15:52   CT Angio Chest PE W and/or Wo Contrast  Result Date: 03/27/2021 CLINICAL DATA:  Tachycardia, short of breath, pleuritic chest pain, abnormal chest x-ray EXAM: CT ANGIOGRAPHY CHEST WITH CONTRAST TECHNIQUE: Multidetector CT imaging of the chest was performed using the standard protocol during bolus administration of intravenous contrast. Multiplanar CT image reconstructions and MIPs were obtained to evaluate the vascular anatomy. CONTRAST:  112mL OMNIPAQUE IOHEXOL 350 MG/ML SOLN COMPARISON:  03/27/2021 FINDINGS: Cardiovascular: This is a technically adequate evaluation of the pulmonary vasculature. No filling defects or pulmonary emboli. There is a large pericardial effusion measuring up to 2.3 cm in thickness. Uniform decreased attenuation suggest transudative effusion. Normal caliber of the thoracic aorta. Mild atherosclerosis of the aorta and coronary vasculature. Mediastinum/Nodes: Pathologically enlarged lymph nodes are seen within the mediastinum, measuring up to 13 mm in short axis reference image 24/4. Adenopathy is also seen within the right supraclavicular region and left axilla. Thyroid, trachea, and esophagus are grossly unremarkable. Lungs/Pleura: There is a large spiculated right upper lobe mass corresponding to the chest x-ray finding, measuring 4.7 x 3.8 by 4.0 cm. Diffuse interlobular septal thickening throughout the right lung concerning for lymphangitic spread of disease. There is background emphysema. No effusion or pneumothorax. Central airways are patent. Upper Abdomen: No  acute abnormality. Musculoskeletal: No acute or destructive bony lesions. Reconstructed images demonstrate no additional findings. Review of the MIP images confirms the above findings. IMPRESSION: 1. Spiculated 4.7 cm right upper lobe  mass consistent with malignancy. Diffuse interlobular septal thickening throughout the right lung concerning for lymphangitic spread of disease. 2. Large pericardial effusion. 3. Lymphadenopathy within the mediastinum, right supraclavicular region, and left axilla, concerning for metastatic disease. 4. No evidence of pulmonary embolus. 5. Aortic Atherosclerosis (ICD10-I70.0) and Emphysema (ICD10-J43.9). Electronically Signed   By: Randa Ngo M.D.   On: 03/27/2021 18:24   CARDIAC CATHETERIZATION  Result Date: 03/27/2021 Conclusions: Successful pericardiocentesis and tube pericardiotomy via the subxiphoid approach, yielding 500 mL of serosanginous fluid. Recommendations: Admit to ICU. Obtain STAT portable CXR. Follow-up fluid analyses, including cytology. Repeat echocardiogram tomorrow.  Anticipate removal of drain when output is less than 50-100 mL/24 hours and minimal residual fluid is present by echo. Nelva Bush, MD [facility]  US Renal  Result Date: 03/27/2021 CLINICAL DATA:  UTI EXAM: RENAL / URINARY TRACT ULTRASOUND COMPLETE COMPARISON:  CT 03/27/2021 FINDINGS: Right Kidney: Renal measurements: 10.9 x 4 x 4.4 cm = volume: 101 mL. Echogenicity within normal limits. No mass or hydronephrosis visualized. Left Kidney: Renal measurements: 11.1 x 4.9 x 5.8 cm = volume: 165 mL. Echogenicity within normal limits. No mass or hydronephrosis visualized. Bladder: Appears normal for degree of bladder distention. Other: None. IMPRESSION: Negative renal ultrasound Electronically Signed   By: Donavan Foil M.D.   On: 03/27/2021 19:37   DG Chest Port 1 View  Result Date: 03/27/2021 CLINICAL DATA:  Cardiac tamponade EXAM: PORTABLE CHEST 1 VIEW COMPARISON:  Chest x-ray  03/27/2021, CT chest 03/27/2021 FINDINGS: Right upper lobe lung mass redemonstrated. Emphysema with bronchitic changes. Cardiomegaly. Interim placement of drainage catheter over the heart presumably a pericardial drainage catheter. Cardiac silhouette decreased compared to prior. Slight increased interstitial opacity possibly superimposed edema. No pneumothorax IMPRESSION: 1. Placement of presumed pericardial drainage catheter with decreased cardiomegaly 2. Emphysema with chronic bronchitic change, slight increased interstitial opacity may reflect superimposed mild edema 3. Redemonstrated spiculated lung mass at the right apex Electronically Signed   By: Donavan Foil M.D.   On: 03/27/2021 22:28   ECHOCARDIOGRAM COMPLETE  Result Date: 03/28/2021    ECHOCARDIOGRAM REPORT   Patient Name:   STARLYN DROGE Date of Exam: 03/28/2021 Medical Rec #:  [id number]   Height:       61.5 in Accession #:    [id number]  Weight:       128.7 lb Date of Birth:  Aug 03, 1961  BSA:          1.576 m Patient Age:    60 years    BP:           128/88 mmHg Patient Gender: F           HR:           105 bpm. Exam Location:  [facility] Procedure: 2D Echo, Color Doppler, Cardiac Doppler and Intracardiac            Opacification Agent Indications:     I31.4 Tamponade  History:         Patient has no prior history of Echocardiogram examinations.                  Signs/Symptoms:Shortness of Breath; Risk Factors:Current                  Smoker. Graves disease.  Sonographer:     Charmayne Sheer RDCS (AE) Referring Phys:  [id number] [address] END Diagnosing Phys: Kate Sable MD  Sonographer Comments: Technically difficult study due to poor echo windows. IMPRESSIONS  1. Left  ventricular ejection fraction, by estimation, is 55%. The left ventricle has normal function. The left ventricle has no regional wall motion abnormalities. Left ventricular diastolic parameters are consistent with Grade II diastolic dysfunction (pseudonormalization).  2. Right ventricular  systolic function is normal. The right ventricular size is normal.  3. The mitral valve is normal in structure. No evidence of mitral valve regurgitation.  4. The aortic valve was not well visualized. Aortic valve regurgitation is not visualized.  5. The inferior vena cava is dilated in size with >50% respiratory variability, suggesting right atrial pressure of 8 mmHg. FINDINGS  Left Ventricle: Left ventricular ejection fraction, by estimation, is 55%. The left ventricle has normal function. The left ventricle has no regional wall motion abnormalities. Definity contrast agent was given IV to delineate the left ventricular endocardial borders. The left ventricular internal cavity size was normal in size. There is no left ventricular hypertrophy. Left ventricular diastolic parameters are consistent with Grade II diastolic dysfunction (pseudonormalization). Right Ventricle: The right ventricular size is normal. No increase in right ventricular wall thickness. Right ventricular systolic function is normal. Left Atrium: Left atrial size was normal in size. Right Atrium: Right atrial size was normal in size. Pericardium: Trivial pericardial effusion is present. Mitral Valve: The mitral valve is normal in structure. No evidence of mitral valve regurgitation. Tricuspid Valve: The tricuspid valve is not well visualized. Tricuspid valve regurgitation is not demonstrated. Aortic Valve: The aortic valve was not well visualized. Aortic valve regurgitation is not visualized. Aortic valve mean gradient measures 2.0 mmHg. Aortic valve peak gradient measures 4.7 mmHg. Aortic valve area, by VTI measures 3.44 cm. Pulmonic Valve: The pulmonic valve was not well visualized. Pulmonic valve regurgitation is not visualized. Aorta: The aortic root is normal in size and structure. Venous: The inferior vena cava is dilated in size with greater than 50% respiratory variability, suggesting right atrial pressure of 8 mmHg. IAS/Shunts: No atrial  level shunt detected by color flow Doppler.  LEFT VENTRICLE PLAX 2D LVIDd:         3.90 cm  Diastology LVIDs:         3.30 cm  LV e' medial:    9.46 cm/s LV PW:         1.20 cm  LV E/e' medial:  8.9 LV IVS:        0.90 cm  LV e' lateral:   4.57 cm/s LVOT diam:     2.20 cm  LV E/e' lateral: 18.4 LV SV:         54 LV SV Index:   34 LVOT Area:     3.80 cm  LEFT ATRIUM           Index LA diam:      3.70 cm 2.35 cm/m LA Vol (A4C): 38.0 ml 24.11 ml/m  AORTIC VALVE                   PULMONIC VALVE AV Area (Vmax):    3.40 cm    PV Vmax:       0.97 m/s AV Area (Vmean):   3.66 cm    PV Vmean:      65.700 cm/s AV Area (VTI):     3.44 cm    PV VTI:        0.122 m AV Vmax:           108.00 cm/s PV Peak grad:  3.7 mmHg AV Vmean:  71.900 cm/s PV Mean grad:  2.0 mmHg AV VTI:            0.156 m AV Peak Grad:      4.7 mmHg AV Mean Grad:      2.0 mmHg LVOT Vmax:         96.60 cm/s LVOT Vmean:        69.300 cm/s LVOT VTI:          0.141 m LVOT/AV VTI ratio: 0.90  AORTA Ao Root diam: 2.80 cm MITRAL VALVE MV Area (PHT): 5.07 cm    SHUNTS MV Decel Time: 150 msec    Systemic VTI:  0.14 m MV E velocity: 84.23 cm/s  Systemic Diam: 2.20 cm MV A velocity: 88.23 cm/s MV E/A ratio:  0.95 Kate Sable MD Electronically signed by Kate Sable MD Signature Date/Time: 03/28/2021/1:51:00 PM    Final         Scheduled Meds:  Chlorhexidine Gluconate Cloth  6 each Topical Q0600   ipratropium-albuterol  3 mL Nebulization TID   ketorolac  30 mg Intravenous Once   mouth rinse  15 mL Mouth Rinse BID   nicotine  21 mg Transdermal Daily   nystatin  5 mL Oral QID   ondansetron (ZOFRAN) IV  4 mg Intravenous Once   Continuous Infusions:  cefTRIAXone (ROCEPHIN)  IV       LOS: 2 days    Time spent: 35 min    Desma Maxim, MD [facility]   If 7PM-7AM, please contact night-coverage [email or web address] Password [facility] 03/29/2021, 8:12 AM

## 2021-03-29 NOTE — Progress Notes (Signed)
Initial Nutrition Assessment  DOCUMENTATION CODES:   Not applicable  INTERVENTION:   Ensure Enlive po TID, each supplement provides 350 kcal and 20 grams of protein  MVI po daily   Liberalize diet   Pt at refeed risk; recommend monitor potassium, magnesium and phosphorus labs daily until stable  NUTRITION DIAGNOSIS:   Increased nutrient needs related to other (see comment) (lung mass with suspsected malignancy) as evidenced by increased estimated needs.  GOAL:   Patient will meet greater than or equal to 90% of their needs  MONITOR:   PO intake, Supplement acceptance, Labs, Weight trends, Skin, I & O's  REASON FOR ASSESSMENT:   Malnutrition Screening Tool    ASSESSMENT:   60 y.o. 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 Pt reports decreased appetite and oral intake for several weeks pta along with an 8lb weight loss. Pt reports that she was eating well in hospital up until this morning. Pt is documented to have eaten 100% of her meals yesterday but only ate about 10% of her breakfast today. Pt reports that she does not drink any supplements at home but she is willing to try chocolate Ensure in hospital. RD discussed with pt the importance of adequate intake needed to preserve lean muscle. RD will add supplements and MVI to help pt meet her estimated needs. Pt is likely at refeed risk. There is no recent documented weights in chart to confirm weight loss.   Medications reviewed and include: nicotine, zofran  Labs reviewed: K 3.6 wnl, creat 0.40(L), P 3.3 wnl, Mg 2.2 wnl  Wbc- 16.7(H)  NUTRITION - FOCUSED PHYSICAL EXAM:  Flowsheet Row Most Recent Value  Orbital Region No depletion  Upper Arm Region No depletion  Thoracic and Lumbar Region No  depletion  Buccal Region No depletion  Temple Region No depletion  Clavicle Bone Region Moderate depletion  Clavicle and Acromion Bone Region Moderate depletion  Scapular Bone Region No depletion  Dorsal Hand No depletion  Patellar Region Moderate depletion  Anterior Thigh Region Moderate depletion  Posterior Calf Region Moderate depletion  Edema (RD Assessment) None  Hair Reviewed  Eyes Reviewed  Mouth Reviewed  Skin Reviewed  Nails Reviewed   Diet Order:   Diet Order             Diet regular Room service appropriate? Yes; Fluid consistency: Thin  Diet effective now                  EDUCATION NEEDS:   Education needs have been addressed  Skin:  Skin Assessment: Reviewed RN Assessment (incision sternum)  Last BM:  7/19- type 5  Height:   Ht Readings from Last 1 Encounters:  03/28/21 5' 1.5" (1.562 m)    Weight:   Wt Readings from Last 1 Encounters:  03/29/21 61.9 kg    Ideal Body Weight:  50 kg  BMI:  Body mass index is 25.37 kg/m.  Estimated Nutritional Needs:   Kcal:  1700-1900kcal/day  Protein:  80-90g/day  Fluid:  1.5-1.8L/day  Casey Campbell MS, RD, LDN Please refer to [facility] for RD and/or RD on-call/weekend/after hours pager  

## 2021-03-30 ENCOUNTER — Inpatient Hospital Stay: Payer: Medicaid Other

## 2021-03-30 DIAGNOSIS — R0603 Acute respiratory distress: Secondary | ICD-10-CM

## 2021-03-30 DIAGNOSIS — J449 Chronic obstructive pulmonary disease, unspecified: Secondary | ICD-10-CM

## 2021-03-30 DIAGNOSIS — J441 Chronic obstructive pulmonary disease with (acute) exacerbation: Secondary | ICD-10-CM

## 2021-03-30 DIAGNOSIS — I480 Paroxysmal atrial fibrillation: Secondary | ICD-10-CM

## 2021-03-30 LAB — BASIC METABOLIC PANEL
Anion gap: 8 (ref 5–15)
BUN: 9 mg/dL (ref 6–20)
CO2: 25 mmol/L (ref 22–32)
Calcium: 8.4 mg/dL — ABNORMAL LOW (ref 8.9–10.3)
Chloride: 99 mmol/L (ref 98–111)
Creatinine, Ser: 0.43 mg/dL — ABNORMAL LOW (ref 0.44–1.00)
GFR, Estimated: >60 mL/min (ref >60)
Glucose, Bld: 166 mg/dL — ABNORMAL HIGH (ref 70–99)
Potassium: 3.7 mmol/L (ref 3.5–5.1)
Sodium: 132 mmol/L — ABNORMAL LOW (ref 135–145)

## 2021-03-30 LAB — BLOOD GAS, VENOUS
Acid-Base Excess: 3.1 mmol/L — ABNORMAL HIGH (ref 0.0–2.0)
Bicarbonate: 27 mmol/L (ref 20.0–28.0)
FIO2: 0.4
O2 Saturation: 90.4 %
Patient temperature: 37
pCO2, Ven: 38 mmHg — ABNORMAL LOW (ref 44.0–60.0)
pH, Ven: 7.46 — ABNORMAL HIGH (ref 7.250–7.430)
pO2, Ven: 56 mmHg — ABNORMAL HIGH (ref 32.0–45.0)

## 2021-03-30 LAB — PH, BODY FLUID: pH, Body Fluid: 7.2

## 2021-03-30 LAB — PROCALCITONIN: Procalcitonin: <0.1 ng/mL

## 2021-03-30 MED ORDER — LORAZEPAM 2 MG/ML IJ SOLN
1.0000 mg | Freq: Four times a day (QID) | INTRAMUSCULAR | Status: DC | PRN
  Administered 2021-03-30 – 2021-04-07 (×4): 1 mg via INTRAVENOUS
  Filled 2021-03-30 (×4): qty 1

## 2021-03-30 MED ORDER — FUROSEMIDE 10 MG/ML IJ SOLN
40.0000 mg | Freq: Once | INTRAMUSCULAR | Status: AC
  Administered 2021-03-30: 40 mg via INTRAVENOUS
  Filled 2021-03-30: qty 4

## 2021-03-30 MED ORDER — GUAIFENESIN ER 600 MG PO TB12
600.0000 mg | ORAL_TABLET | Freq: Two times a day (BID) | ORAL | Status: DC | PRN
  Administered 2021-03-30 (×2): 600 mg via ORAL
  Filled 2021-03-30 (×2): qty 1

## 2021-03-30 NOTE — Progress Notes (Signed)
PT Cancellation Note  Patient Details Name: Nichole Park MRN: [id number] DOB: 12/30/1960   Cancelled Treatment:    Reason Eval/Treat Not Completed: Patient not medically ready PT orders received, chart reviewed. MD cleared pt for participation in session. Upon arrival to room, nurse reports ~4 pm today will be 24 hrs following initiation of amiodarone & pt endorses fatigue after nurse just assisted her with peri hygiene & declines participation at this time. Will f/u tomorrow as able, as pt is medically appropriate & willing to participate.  Lavone Nian, PT, DPT 03/30/21, 2:39 PM   [name] 03/30/2021, 2:38 PM

## 2021-03-30 NOTE — Progress Notes (Signed)
Chaplain Maggie delivered a prayer shawl to patient. She was grateful and noted it was knitted in her favor colors.

## 2021-03-30 NOTE — Progress Notes (Signed)
Chaplain Maggie made initial visit with patient and family at bedside. Advanced Directive paperwork was delivered and education shared. Space was made for storytelling and listening. All joined in prayer together. Chaplain available for continued support as needed.

## 2021-03-30 NOTE — Progress Notes (Addendum)
Pt awakened from sleep due to sudden onset of cough- staff find pt restless in bed exhibiting a panic attack- tachypneic and anxious. Charge RN Mariah to bedside at 00:05 for assistance. All IV's removed by pt.  No PRN anxiolytics due at this time- MD Damita Dunnings consulted/paged. No orders to acknowledge at this time. [name] O2 on RA stating between 85-87%- Pt not tolerating NRB at this time. 5LNC placed: O2 now 88%  Provider to bedside for re-evaluation at [place]  2 attempts for venous access preformed by this RN-unsuccessful. IV team called due to pt's difficulty for IV insertion.   VBG Order and portable stat CXray placed at 0048.   Pt calmer and transitioned to NRB at [place].

## 2021-03-30 NOTE — Progress Notes (Signed)
PROGRESS NOTE  Cross coverage progress note  Nichole Park  HYI:[id number] DOB: 1960-11-21 DOA: 03/27/2021 PCP: Pcp, No   Called about patient who was anxious, ripping out IVs, hypoxic, requiring 5 L nonrebreather  Brief Narrative:  60 year old female a who presented with shortness of breath and chest discomfort and found to have a large right upper lobe mass and pericardial effusion with tamponade status post pericardial drain.  Stay complicated rapid A. fib for which she is currently on amiodarone and diltiazem.  Holding off on anticoagulation per cardiology given lung pathology  Assessment & Plan:  Acute hypoxic respiratory failure - O2 sats as low as 85% on room air. - Chest x-ray shows mild cardiomegaly with increasing vascular congestion/mild pulmonary edema and small left effusion as well as spiculated RUL mass.  No pneumothorax - VBG consistent with hyperventilation -- Suspect related to mild volume overload based on chest x-ray findings.  No evidence of pneumothorax.  Differential will include PE but given pulmonary vascular congestion on chest x-ray explaining current findings and risks associated with anticoagulation given large spiculated mass, will continue to monitor very closely in stepdown  Mild hypotension - Patient currently on amiodarone and diltiazem infusions - Will continue to wean diltiazem as recommended per cardiology note    Principal Problem:   Cardiac tamponade Active Problems:   Cardiac/pericardial tamponade   Pericardial effusion   Tobacco use  CRITICAL CARE Performed by: Athena Masse   Total critical care time: 45 minutes  Critical care time was exclusive of separately billable procedures and treating other patients.  Critical care was necessary to treat or prevent imminent or life-threatening deterioration.  Critical care was time spent personally by me on the following activities: development of treatment plan with patient and/or surrogate as  well as nursing, discussions with consultants, evaluation of patient's response to treatment, examination of patient, obtaining history from patient or surrogate, ordering and performing treatments and interventions, ordering and review of laboratory studies, ordering and review of radiographic studies, pulse oximetry and re-evaluation of patient's condition.   Subjective: Patient somnolent because of Ativan given earlier  Objective: Vitals:   03/29/21 1600 03/29/21 1800 03/29/21 2000 03/30/21 0004  BP: 111/83 111/89 (!) 135/96 (!) 152/101  Pulse: (!) 118 (!) 160 (!) 114 (!) 109  Resp: (!) 32 (!) 32 (!) 36 (!) 34  Temp:   98.1 F (36.7 C)   TempSrc:   Axillary   SpO2: 95% 94% 95% (!) 89%  Weight:      Height:        Intake/Output Summary (Last 24 hours) at 03/30/2021 0151 Last data filed at 03/29/2021 0500 Gross per 24 hour  Intake --  Output 650 ml  Net -650 ml   Filed Weights   03/28/21 0500 03/29/21 0500  Weight: 58.4 kg 61.9 kg    Examination:  General exam: Appears calm and comfortable, somnolent Respiratory system: Coarse breath sounds. Respiratory effort increased Cardiovascular system: Tachycardia. No JVD, murmurs, rubs, gallops or clicks. No pedal edema. Gastrointestinal system: Abdomen is nondistended, soft and nontender. No organomegaly or masses felt. Normal bowel sounds heard. Central nervous system: Somnolent. No focal neurological deficits.      Data Reviewed: I have personally reviewed following labs and imaging studies  CBC: Recent Labs  Lab 03/27/21 1502 03/28/21 0131 03/29/21 0413  WBC 21.1*  21.7* 20.3* 16.7*  NEUTROABS 15.1*  --   --   HGB 15.6*  15.7* 13.8 14.7  HCT 46.6*  45.4 40.5  43.7  MCV 88.4  88.0 87.7 86.5  PLT 490*  467* 377 323*   Basic Metabolic Panel: Recent Labs  Lab 03/27/21 1502 03/28/21 0131 03/29/21 0413  NA 131* 135 139  K 4.5 4.1 3.6  CL 98 104 106  CO2 20* 23 25  GLUCOSE 144* 120* 113*  BUN 34* 22* 10   CREATININE 0.81 0.58 0.40*  CALCIUM 9.4 8.6* 8.7*  MG  --  2.2  --   PHOS  --  3.3  --    GFR: Estimated Creatinine Clearance: 66.4 mL/min (A) (by C-G formula based on SCr of 0.4 mg/dL (L)). Liver Function Tests: Recent Labs  Lab 03/27/21 1635  AST 31  ALT 21  ALKPHOS 74  BILITOT 1.2  PROT 7.7  ALBUMIN 3.2*   No results for input(s): LIPASE, AMYLASE in the last 168 hours. No results for input(s): AMMONIA in the last 168 hours. Coagulation Profile: Recent Labs  Lab 03/27/21 1701  INR 1.1   Cardiac Enzymes: Recent Labs  Lab 03/27/21 1635  CKTOTAL 15*   BNP (last 3 results) No results for input(s): PROBNP in the last 8760 hours. HbA1C: Recent Labs    03/29/21 0948  HGBA1C 6.2*   CBG: Recent Labs  Lab 03/27/21 2138  GLUCAP 101*   Lipid Profile: No results for input(s): CHOL, HDL, LDLCALC, TRIG, CHOLHDL, LDLDIRECT in the last 72 hours. Thyroid Function Tests: Recent Labs    03/28/21 0131  TSH 3.020  FREET4 1.22*  T3FREE 2.0   Anemia Panel: No results for input(s): VITAMINB12, FOLATE, FERRITIN, TIBC, IRON, RETICCTPCT in the last 72 hours. Sepsis Labs: Recent Labs  Lab 03/27/21 1502 03/27/21 1701 03/27/21 1751 03/27/21 2216 03/28/21 0131 03/29/21 0413  PROCALCITON <0.10  --   --   --   --  <0.10  LATICACIDVEN  --  5.3* 3.9* 2.5* 1.7  --     Recent Results (from the past 240 hour(s))  Blood culture (routine single)     Status: None (Preliminary result)   Collection Time: 03/27/21  5:01 PM   Specimen: BLOOD  Result Value Ref Range Status   Specimen Description BLOOD RIGHT ANTECUBITAL  Final   Special Requests   Final    BOTTLES DRAWN AEROBIC AND ANAEROBIC Blood Culture adequate volume   Culture   Final    NO GROWTH 2 DAYS Performed at [facility], [address], [place], [place] [address]    Report Status PENDING  Incomplete  Resp Panel by RT-PCR (Flu A&B, Covid) Nasopharyngeal Swab     Status: None   Collection Time: 03/27/21   5:01 PM   Specimen: Nasopharyngeal Swab; Nasopharyngeal(NP) swabs in vial transport medium  Result Value Ref Range Status   SARS Coronavirus 2 by RT PCR NEGATIVE NEGATIVE Final    Comment: (NOTE) SARS-CoV-2 target nucleic acids are NOT DETECTED.  The SARS-CoV-2 RNA is generally detectable in upper respiratory specimens during the acute phase of infection. The lowest concentration of SARS-CoV-2 viral copies this assay can detect is 138 copies/mL. A negative result does not preclude SARS-Cov-2 infection and should not be used as the sole basis for treatment or other patient management decisions. A negative result may occur with  improper specimen collection/handling, submission of specimen other than nasopharyngeal swab, presence of viral mutation(s) within the areas targeted by this assay, and inadequate number of viral copies(<138 copies/mL). A negative result must be combined with clinical observations, patient history, and epidemiological information. The expected result is Negative.  Fact Sheet for Patients:  EntrepreneurPulse.com.au  Fact Sheet for Healthcare Providers:  IncredibleEmployment.be  This test is no t yet approved or cleared by the [place] FDA and  has been authorized for detection and/or diagnosis of SARS-CoV-2 by FDA under an Emergency Use Authorization (EUA). This EUA will remain  in effect (meaning this test can be used) for the duration of the COVID-19 declaration under Section 564(b)(1) of the Act, 21 U.S.C.section 360bbb-3(b)(1), unless the authorization is terminated  or revoked sooner.       Influenza A by PCR NEGATIVE NEGATIVE Final   Influenza B by PCR NEGATIVE NEGATIVE Final    Comment: (NOTE) The Xpert Xpress SARS-CoV-2/FLU/RSV plus assay is intended as an aid in the diagnosis of influenza from Nasopharyngeal swab specimens and should not be used as a sole basis for treatment. Nasal washings and aspirates  are unacceptable for Xpert Xpress SARS-CoV-2/FLU/RSV testing.  Fact Sheet for Patients: EntrepreneurPulse.com.au  Fact Sheet for Healthcare Providers: IncredibleEmployment.be  This test is not yet approved or cleared by the [place] FDA and has been authorized for detection and/or diagnosis of SARS-CoV-2 by FDA under an Emergency Use Authorization (EUA). This EUA will remain in effect (meaning this test can be used) for the duration of the COVID-19 declaration under Section 564(b)(1) of the Act, 21 U.S.C. section 360bbb-3(b)(1), unless the authorization is terminated or revoked.  Performed at [facility], [address], [place], [place] [address]   Urine Culture     Status: Abnormal   Collection Time: 03/27/21  5:01 PM   Specimen: In/Out Cath Urine  Result Value Ref Range Status   Specimen Description   Final    IN/OUT CATH URINE Performed at [facility], [address] [place], [place] [address]    Special Requests   Final    NONE Performed at [facility], [address] [place], [place] [address]    Culture (A)  Final    <10,000 COLONIES/mL INSIGNIFICANT GROWTH Performed at [facility] [address], [place], [place] [address]    Report Status 03/29/2021 FINAL  Final  Blood culture (single)     Status: None (Preliminary result)   Collection Time: 03/27/21  6:05 PM   Specimen: BLOOD  Result Value Ref Range Status   Specimen Description BLOOD LEFT ANTECUBITAL  Final   Special Requests   Final    BOTTLES DRAWN AEROBIC AND ANAEROBIC Blood Culture results may not be optimal due to an inadequate volume of blood received in culture bottles   Culture   Final    NO GROWTH 2 DAYS Performed at [facility], [address], [place], [place] [address]    Report Status PENDING  Incomplete  Body fluid culture w Gram Stain     Status: None (Preliminary result)   Collection Time: 03/27/21   8:26 PM   Specimen: Pericardial; Body Fluid  Result Value Ref Range Status   Specimen Description   Final    PERICARDIAL Performed at [facility], [address], [place], [place] [address]    Special Requests   Final    NONE Performed at [facility], [address] [place], [place] [address]    Gram Stain   Final    RARE WBC PRESENT,BOTH PMN AND MONONUCLEAR NO ORGANISMS SEEN    Culture   Final    NO GROWTH 1 DAY Performed at [facility], [place] [address], [place], [place] [address]  Report Status PENDING  Incomplete  Acid Fast Smear (AFB)     Status: None   Collection Time: 03/27/21  8:26 PM   Specimen: Pericardial Fluid  Result Value Ref Range Status   AFB Specimen Processing Concentration  Final   Acid Fast Smear Negative  Final    Comment: (NOTE) Performed At: [facility] [place], [place] [id number] Rush Farmer MD [id number]    Source (AFB) FLUID  Final    Comment: Performed at [facility], [address] [place], [place] [address]  Culture, blood (routine x 2)     Status: None (Preliminary result)   Collection Time: 03/27/21 10:18 PM   Specimen: BLOOD RIGHT FOREARM  Result Value Ref Range Status   Specimen Description BLOOD RIGHT FOREARM  Final   Special Requests   Final    BOTTLES DRAWN AEROBIC AND ANAEROBIC Blood Culture adequate volume   Culture   Final    NO GROWTH 2 DAYS Performed at [facility], [address], [place], [place] [address]    Report Status PENDING  Incomplete  MRSA Next Gen by PCR, Nasal     Status: None   Collection Time: 03/27/21 11:01 PM   Specimen: Nasal Mucosa; Nasal Swab  Result Value Ref Range Status   MRSA by PCR Next Gen NOT DETECTED NOT DETECTED Final    Comment: (NOTE) The GeneXpert MRSA Assay (FDA approved for NASAL specimens only), is one component of a comprehensive MRSA colonization surveillance program. It is not intended to diagnose MRSA  infection nor to guide or monitor treatment for MRSA infections. Test performance is not FDA approved in patients less than 9 years old. Performed at [facility], [address] [place], [place] [address]          Radiology Studies: DG Chest 1 View  Result Date: 03/30/2021 CLINICAL DATA:  Possible pneumothorax EXAM: CHEST  1 VIEW COMPARISON:  03/29/2021 FINDINGS: Spiculated right upper lobe lung mass with relative hyperlucency in the vicinity but without change, prior chest CT did not demonstrate corresponding abnormality other than emphysema. No discrete pleural line is seen. Small left pleural effusion. Mild cardiomegaly with increasing vascular congestion and pulmonary edema. Subsegmental atelectasis at the left lung base. Drainage catheter appears removed. IMPRESSION: 1. Mild cardiomegaly with increasing vascular congestion/mild pulmonary edema and small left effusion 2. Spiculated right upper lobe lung mass. Asymmetrical hyperlucency surrounding the mass without change and no definitive pleural line to suggest pneumothorax. Electronically Signed   By: Donavan Foil M.D.   On: 03/30/2021 01:11   DG Chest Port 1 View  Result Date: 03/29/2021 CLINICAL DATA:  Shortness of breath and chest pain EXAM: PORTABLE CHEST 1 VIEW COMPARISON:  03/27/2021 FINDINGS: Cardiac shadow is mildly enlarged but stable. Spiculated right upper lobe mass lesion is again noted. Mild increase in parenchymal edema is seen. Small bore catheter is again noted over the cardiac shadow likely related to a pericardial drain. No pneumothorax is noted. IMPRESSION: Increasing edema particularly on the right. Stable right upper lobe mass lesion. No new focal abnormality is noted. Electronically Signed   By: Inez Catalina M.D.   On: 03/29/2021 09:02   ECHOCARDIOGRAM COMPLETE  Result Date: 03/28/2021    ECHOCARDIOGRAM REPORT   Patient Name:   Nichole Park Date of Exam: 03/28/2021 Medical Rec #:  [id number]   Height:        61.5 in Accession #:    [id number]  Weight:  128.7 lb Date of Birth:  1961-02-02  BSA:          1.576 m Patient Age:    28 years    BP:           128/88 mmHg Patient Gender: F           HR:           105 bpm. Exam Location:  [facility] Procedure: 2D Echo, Color Doppler, Cardiac Doppler and Intracardiac            Opacification Agent Indications:     I31.4 Tamponade  History:         Patient has no prior history of Echocardiogram examinations.                  Signs/Symptoms:Shortness of Breath; Risk Factors:Current                  Smoker. Graves disease.  Sonographer:     Charmayne Sheer RDCS (AE) Referring Phys:  [id number] Diagnosing Phys: Kate Sable MD  Sonographer Comments: Technically difficult study due to poor echo windows. IMPRESSIONS  1. Left ventricular ejection fraction, by estimation, is 55%. The left ventricle has normal function. The left ventricle has no regional wall motion abnormalities. Left ventricular diastolic parameters are consistent with Grade II diastolic dysfunction (pseudonormalization).  2. Right ventricular systolic function is normal. The right ventricular size is normal.  3. The mitral valve is normal in structure. No evidence of mitral valve regurgitation.  4. The aortic valve was not well visualized. Aortic valve regurgitation is not visualized.  5. The inferior vena cava is dilated in size with >50% respiratory variability, suggesting right atrial pressure of 8 mmHg. FINDINGS  Left Ventricle: Left ventricular ejection fraction, by estimation, is 55%. The left ventricle has normal function. The left ventricle has no regional wall motion abnormalities. Definity contrast agent was given IV to delineate the left ventricular endocardial borders. The left ventricular internal cavity size was normal in size. There is no left ventricular hypertrophy. Left ventricular diastolic parameters are consistent with Grade II diastolic dysfunction (pseudonormalization). Right Ventricle:  The right ventricular size is normal. No increase in right ventricular wall thickness. Right ventricular systolic function is normal. Left Atrium: Left atrial size was normal in size. Right Atrium: Right atrial size was normal in size. Pericardium: Trivial pericardial effusion is present. Mitral Valve: The mitral valve is normal in structure. No evidence of mitral valve regurgitation. Tricuspid Valve: The tricuspid valve is not well visualized. Tricuspid valve regurgitation is not demonstrated. Aortic Valve: The aortic valve was not well visualized. Aortic valve regurgitation is not visualized. Aortic valve mean gradient measures 2.0 mmHg. Aortic valve peak gradient measures 4.7 mmHg. Aortic valve area, by VTI measures 3.44 cm. Pulmonic Valve: The pulmonic valve was not well visualized. Pulmonic valve regurgitation is not visualized. Aorta: The aortic root is normal in size and structure. Venous: The inferior vena cava is dilated in size with greater than 50% respiratory variability, suggesting right atrial pressure of 8 mmHg. IAS/Shunts: No atrial level shunt detected by color flow Doppler.  LEFT VENTRICLE PLAX 2D LVIDd:         3.90 cm  Diastology LVIDs:         3.30 cm  LV e' medial:    9.46 cm/s LV PW:         1.20 cm  LV E/e' medial:  8.9 LV IVS:  0.90 cm  LV e' lateral:   4.57 cm/s LVOT diam:     2.20 cm  LV E/e' lateral: 18.4 LV SV:         54 LV SV Index:   34 LVOT Area:     3.80 cm  LEFT ATRIUM           Index LA diam:      3.70 cm 2.35 cm/m LA Vol (A4C): 38.0 ml 24.11 ml/m  AORTIC VALVE                   PULMONIC VALVE AV Area (Vmax):    3.40 cm    PV Vmax:       0.97 m/s AV Area (Vmean):   3.66 cm    PV Vmean:      65.700 cm/s AV Area (VTI):     3.44 cm    PV VTI:        0.122 m AV Vmax:           108.00 cm/s PV Peak grad:  3.7 mmHg AV Vmean:          71.900 cm/s PV Mean grad:  2.0 mmHg AV VTI:            0.156 m AV Peak Grad:      4.7 mmHg AV Mean Grad:      2.0 mmHg LVOT Vmax:         96.60  cm/s LVOT Vmean:        69.300 cm/s LVOT VTI:          0.141 m LVOT/AV VTI ratio: 0.90  AORTA Ao Root diam: 2.80 cm MITRAL VALVE MV Area (PHT): 5.07 cm    SHUNTS MV Decel Time: 150 msec    Systemic VTI:  0.14 m MV E velocity: 84.23 cm/s  Systemic Diam: 2.20 cm MV A velocity: 88.23 cm/s MV E/A ratio:  0.95 Kate Sable MD Electronically signed by Kate Sable MD Signature Date/Time: 03/28/2021/1:51:00 PM    Final    ECHOCARDIOGRAM LIMITED  Result Date: 03/29/2021    ECHOCARDIOGRAM LIMITED REPORT   Patient Name:   MARYBELLA ETHIER Date of Exam: 03/29/2021 Medical Rec #:  [id number]   Height:       61.5 in Accession #:    [id number]  Weight:       136.5 lb Date of Birth:  1961-05-06  BSA:          1.615 m Patient Age:    59 years    BP:           137/91 mmHg Patient Gender: F           HR:           116 bpm. Exam Location:  [facility] Procedure: Cardiac Doppler, Color Doppler and Limited Echo Indications:     Pericardial Effusion I31.3  History:         Patient has prior history of Echocardiogram examinations, most                  recent 03/28/2021. Risk Factors:Hypertension. Cardiac tamponade.  Sonographer:     Sherrie Sport RDCS (AE) Referring Phys:  [id number] [place] Diagnosing Phys: Ida Rogue MD  Sonographer Comments: Technically difficult study due to poor echo windows. IMPRESSIONS  1. Trvial pericardial effusion.  2. Left ventricular ejection fraction, by estimation, is 60 to 65%. The left ventricle has normal function. The left ventricle has no regional  wall motion abnormalities.  3. Right ventricular systolic function is normal. The right ventricular size is normal.  4. The mitral valve is normal in structure. No evidence of mitral valve regurgitation. No evidence of mitral stenosis. FINDINGS  Left Ventricle: Left ventricular ejection fraction, by estimation, is 60 to 65%. The left ventricle has normal function. The left ventricle has no regional wall motion abnormalities. The left ventricular  internal cavity size was normal in size. There is  no left ventricular hypertrophy. Right Ventricle: The right ventricular size is normal. No increase in right ventricular wall thickness. Right ventricular systolic function is normal. Left Atrium: Left atrial size was normal in size. Right Atrium: Right atrial size was normal in size. Pericardium: Trivial pericardial effusion is present. Mitral Valve: The mitral valve is normal in structure. No evidence of mitral valve stenosis. Tricuspid Valve: The tricuspid valve is normal in structure. Tricuspid valve regurgitation is not demonstrated. No evidence of tricuspid stenosis. Aortic Valve: The aortic valve was not well visualized. Aortic valve regurgitation is not visualized. No aortic stenosis is present. Pulmonic Valve: The pulmonic valve was normal in structure. Pulmonic valve regurgitation is not visualized. No evidence of pulmonic stenosis. Aorta: The aortic root is normal in size and structure. Venous: The inferior vena cava is normal in size with greater than 50% respiratory variability, suggesting right atrial pressure of 3 mmHg. IAS/Shunts: No atrial level shunt detected by color flow Doppler. LEFT VENTRICLE PLAX 2D LVIDd:         4.04 cm LVIDs:         2.55 cm LV PW:         1.31 cm LV IVS:        0.93 cm LVOT diam:     2.00 cm LVOT Area:     3.14 cm  LEFT ATRIUM           Index      RIGHT ATRIUM           Index LA diam:      3.10 cm 1.92 cm/m RA Area:     10.20 cm LA Vol (A4C): 11.2 ml 6.93 ml/m RA Volume:   23.00 ml  14.24 ml/m   AORTA Ao Root diam: 2.80 cm TRICUSPID VALVE TR Peak grad:   4.8 mmHg TR Vmax:        110.00 cm/s  SHUNTS Systemic Diam: 2.00 cm Ida Rogue MD Electronically signed by Ida Rogue MD Signature Date/Time: 03/29/2021/3:32:05 PM    Final         Scheduled Meds:  Chlorhexidine Gluconate Cloth  6 each Topical Q0600   enoxaparin (LOVENOX) injection  40 mg Subcutaneous Q24H   feeding supplement  237 mL Oral TID BM    ipratropium  0.5 mg Nebulization TID   ketorolac  30 mg Intravenous Once   levalbuterol  0.63 mg Nebulization TID   mouth rinse  15 mL Mouth Rinse BID   multivitamin with minerals  1 tablet Oral Daily   nicotine  21 mg Transdermal Daily   nystatin  5 mL Oral QID   ondansetron (ZOFRAN) IV  4 mg Intravenous Once   Continuous Infusions:  amiodarone 60 mg/hr (03/29/21 2149)   Followed by   amiodarone     diltiazem (CARDIZEM) infusion 12.5 mg/hr (03/30/21 0142)     LOS: 3 days    Time spent: [place], MD [facility]

## 2021-03-30 NOTE — Progress Notes (Signed)
MD Damita Dunnings notified of pt's trending hypotension- verbal orders to decrease diltiazem dose per set protocol.   Initial dose 15mg /HR, see MAR/ flow sheet for the following times: 01:30a/ 02:00a/ 02:30a

## 2021-03-30 NOTE — Progress Notes (Signed)
Progress Note  Patient Name: Nichole Park Date of Encounter: 03/30/2021  [facility] Cardiologist: None   Subjective   Tired this morning, increasing shortness of breath, bronchospastic cough Significant paroxysmal atrial fibrillation last night with rates up to 180, extra amiodarone infusions given, no arrhythmia overnight or this morning Low blood pressure this morning, weaning diltiazem infusion  Inpatient Medications    Scheduled Meds:  Chlorhexidine Gluconate Cloth  6 each Topical Q0600   enoxaparin (LOVENOX) injection  40 mg Subcutaneous Q24H   feeding supplement  237 mL Oral TID BM   ipratropium  0.5 mg Nebulization TID   ketorolac  30 mg Intravenous Once   levalbuterol  0.63 mg Nebulization TID   mouth rinse  15 mL Mouth Rinse BID   multivitamin with minerals  1 tablet Oral Daily   nicotine  21 mg Transdermal Daily   nystatin  5 mL Oral QID   ondansetron (ZOFRAN) IV  4 mg Intravenous Once   Continuous Infusions:  amiodarone     diltiazem (CARDIZEM) infusion 5 mg/hr (03/30/21 0535)   PRN Meds: acetaminophen, chlorpheniramine-HYDROcodone, docusate sodium, guaiFENesin, ketorolac, LORazepam, polyethylene glycol   Vital Signs    Vitals:   03/30/21 0832 03/30/21 0900 03/30/21 1000 03/30/21 1100  BP:  (!) 85/61 (!) 91/58 107/81  Pulse: 92 80 82 84  Resp: (!) 23 (!) 25 (!) 24 (!) 28  Temp:      TempSrc:      SpO2: 93% 95% 95% 94%  Weight:      Height:        Intake/Output Summary (Last 24 hours) at 03/30/2021 1136 Last data filed at 03/30/2021 0535 Gross per 24 hour  Intake 616.2 ml  Output 400 ml  Net 216.2 ml   Last 3 Weights 03/29/2021 03/28/2021 03/19/2017  Weight (lbs) 136 lb 7.4 oz 128 lb 12 oz 124 lb  Weight (kg) 61.9 kg 58.4 kg 56.246 kg      Telemetry    Paroxysmal atrial fibrillation last night, normal sinus rhythm overnight and this morning personally Reviewed  ECG    - Personally Reviewed  Physical Exam   GEN: No acute distress.    Neck: No JVD Cardiac: RRR, no murmurs, rubs, or gallops.  Respiratory: Coarse breath sounds bilaterally GI: Soft, nontender, non-distended  MS: No edema; No deformity. Neuro:  Nonfocal  Psych: Normal affect   Labs    High Sensitivity Troponin:  No results for input(s): TROPONINIHS in the last 720 hours.    Chemistry Recent Labs  Lab 03/27/21 1635 03/28/21 0131 03/29/21 0413 03/30/21 0452  NA  --  135 139 132*  K  --  4.1 3.6 3.7  CL  --  104 106 99  CO2  --  23 25 25   GLUCOSE  --  120* 113* 166*  BUN  --  22* 10 9  CREATININE  --  0.58 0.40* 0.43*  CALCIUM  --  8.6* 8.7* 8.4*  PROT 7.7  --   --   --   ALBUMIN 3.2*  --   --   --   AST 31  --   --   --   ALT 21  --   --   --   ALKPHOS 74  --   --   --   BILITOT 1.2  --   --   --   GFRNONAA  --  >60 >60 >60  ANIONGAP  --  8 8 8  Hematology Recent Labs  Lab 03/27/21 1502 03/28/21 0131 03/29/21 0413  WBC 21.1*  21.7* 20.3* 16.7*  RBC 5.27*  5.16* 4.62 5.05  HGB 15.6*  15.7* 13.8 14.7  HCT 46.6*  45.4 40.5 43.7  MCV 88.4  88.0 87.7 86.5  MCH 29.6  30.4 29.9 29.1  MCHC 33.5  34.6 34.1 33.6  RDW 12.8  12.7 12.6 12.6  PLT 490*  467* 377 423*    BNPNo results for input(s): BNP, PROBNP in the last 168 hours.   DDimer No results for input(s): DDIMER in the last 168 hours.   Radiology    DG Chest 1 View  Result Date: 03/30/2021 CLINICAL DATA:  Possible pneumothorax EXAM: CHEST  1 VIEW COMPARISON:  03/29/2021 FINDINGS: Spiculated right upper lobe lung mass with relative hyperlucency in the vicinity but without change, prior chest CT did not demonstrate corresponding abnormality other than emphysema. No discrete pleural line is seen. Small left pleural effusion. Mild cardiomegaly with increasing vascular congestion and pulmonary edema. Subsegmental atelectasis at the left lung base. Drainage catheter appears removed. IMPRESSION: 1. Mild cardiomegaly with increasing vascular congestion/mild pulmonary edema  and small left effusion 2. Spiculated right upper lobe lung mass. Asymmetrical hyperlucency surrounding the mass without change and no definitive pleural line to suggest pneumothorax. Electronically Signed   By: Donavan Foil M.D.   On: 03/30/2021 01:11   DG Chest Port 1 View  Result Date: 03/29/2021 CLINICAL DATA:  Shortness of breath and chest pain EXAM: PORTABLE CHEST 1 VIEW COMPARISON:  03/27/2021 FINDINGS: Cardiac shadow is mildly enlarged but stable. Spiculated right upper lobe mass lesion is again noted. Mild increase in parenchymal edema is seen. Small bore catheter is again noted over the cardiac shadow likely related to a pericardial drain. No pneumothorax is noted. IMPRESSION: Increasing edema particularly on the right. Stable right upper lobe mass lesion. No new focal abnormality is noted. Electronically Signed   By: Inez Catalina M.D.   On: 03/29/2021 09:02   ECHOCARDIOGRAM LIMITED  Result Date: 03/29/2021    ECHOCARDIOGRAM LIMITED REPORT   Patient Name:   Nichole Park Date of Exam: 03/29/2021 Medical Rec #:  [id number]   Height:       61.5 in Accession #:    [id number]  Weight:       136.5 lb Date of Birth:  01-21-1961  BSA:          1.615 m Patient Age:    60 years    BP:           137/91 mmHg Patient Gender: F           HR:           116 bpm. Exam Location:  [facility] Procedure: Cardiac Doppler, Color Doppler and Limited Echo Indications:     Pericardial Effusion I31.3  History:         Patient has prior history of Echocardiogram examinations, most                  recent 03/28/2021. Risk Factors:Hypertension. Cardiac tamponade.  Sonographer:     Sherrie Sport RDCS (AE) Referring Phys:  [id number] [place] Diagnosing Phys: Ida Rogue MD  Sonographer Comments: Technically difficult study due to poor echo windows. IMPRESSIONS  1. Trvial pericardial effusion.  2. Left ventricular ejection fraction, by estimation, is 60 to 65%. The left ventricle has normal function. The left ventricle has no  regional wall motion abnormalities.  3. Right ventricular systolic  function is normal. The right ventricular size is normal.  4. The mitral valve is normal in structure. No evidence of mitral valve regurgitation. No evidence of mitral stenosis. FINDINGS  Left Ventricle: Left ventricular ejection fraction, by estimation, is 60 to 65%. The left ventricle has normal function. The left ventricle has no regional wall motion abnormalities. The left ventricular internal cavity size was normal in size. There is  no left ventricular hypertrophy. Right Ventricle: The right ventricular size is normal. No increase in right ventricular wall thickness. Right ventricular systolic function is normal. Left Atrium: Left atrial size was normal in size. Right Atrium: Right atrial size was normal in size. Pericardium: Trivial pericardial effusion is present. Mitral Valve: The mitral valve is normal in structure. No evidence of mitral valve stenosis. Tricuspid Valve: The tricuspid valve is normal in structure. Tricuspid valve regurgitation is not demonstrated. No evidence of tricuspid stenosis. Aortic Valve: The aortic valve was not well visualized. Aortic valve regurgitation is not visualized. No aortic stenosis is present. Pulmonic Valve: The pulmonic valve was normal in structure. Pulmonic valve regurgitation is not visualized. No evidence of pulmonic stenosis. Aorta: The aortic root is normal in size and structure. Venous: The inferior vena cava is normal in size with greater than 50% respiratory variability, suggesting right atrial pressure of 3 mmHg. IAS/Shunts: No atrial level shunt detected by color flow Doppler. LEFT VENTRICLE PLAX 2D LVIDd:         4.04 cm LVIDs:         2.55 cm LV PW:         1.31 cm LV IVS:        0.93 cm LVOT diam:     2.00 cm LVOT Area:     3.14 cm  LEFT ATRIUM           Index      RIGHT ATRIUM           Index LA diam:      3.10 cm 1.92 cm/m RA Area:     10.20 cm LA Vol (A4C): 11.2 ml 6.93 ml/m RA  Volume:   23.00 ml  14.24 ml/m   AORTA Ao Root diam: 2.80 cm TRICUSPID VALVE TR Peak grad:   4.8 mmHg TR Vmax:        110.00 cm/s  SHUNTS Systemic Diam: 2.00 cm Ida Rogue MD Electronically signed by Ida Rogue MD Signature Date/Time: 03/29/2021/3:32:05 PM    Final     Cardiac Studies  Echocardiogram performed today  1. Trvial pericardial effusion.   2. Left ventricular ejection fraction, by estimation, is 60 to 65%. The  left ventricle has normal function. The left ventricle has no regional  wall motion abnormalities.   3. Right ventricular systolic function is normal. The right ventricular  size is normal.   4. The mitral valve is normal in structure. No evidence of mitral valve  regurgitation. No evidence of mitral stenosis.    Patient Profile     60 y.o. female with history of Graves' disease, smoker x40+ years presenting with shortness of breath, found to have a large pericardial effusion, right upper lobe lung mass suspicious for malignancy.  Assessment & Plan    Pericardial effusion  in the setting of malignancy right upper lobe mass, cytology pending -S/p pericardial drain, removed yesterday   2.  Emphysema, lung mass -Management as per critical care team RUL lung malignancy with mediastinal metastasis.  Increasing cough concerning for bronchitis Long smoking history 1 pack/day  since age 39 Would avoid albuterol given arrhythmia issues, Xopenex as needed, consider antibiotics if clinically indicated  3.  Smoking -Cessation advised Has been smoking since age 69 1 pack/day Significant bronchospastic cough, concern for bronchitis  4.  Paroxysmal atrial fibrillation Frequent episodes with rates up to 180s, difficult to break Breakthrough arrhythmia on diltiazem alone, amiodarone added Extra IV boluses of amiodarone needed yesterday for rates in the 180s Diltiazem infusion being weaned for hypotension Would continue amiodarone infusion, high risk of recurrent  arrhythmia Would avoid beta-blockers for now given bronchospasm  Case discussed with nursing, hospitalist service, ICU team  Total encounter time more than 35 minutes  Greater than 50% was spent in counseling and coordination of care with the patient   For questions or updates, please contact [place] Please consult [email or web address] for contact info under        Signed, Ida Rogue, MD  03/30/2021, 11:36 AM

## 2021-03-30 NOTE — Progress Notes (Signed)
Nichole Park  [phone] Fax:(336) [phone]  ID: Nichole Park OB: 06/19/1961  MR#: [id number]  MBT#:[id number]  Patient Care Team: Pcp, No as PCP - General  CHIEF COMPLAINT: Lung Mass  INTERVAL HISTORY: Nichole Park is a 60 year old female with no significant past medical history other than remote history of Graves' disease and current everyday smoker who presents to the emergency room for sharp epigastric pain, progressive shortness of breath over the past 3 weeks and generalized weakness.  Reports over the past few months she has been feeling more weak with mild left-sided chest pain that was worse with inspiration.  Reported intermittent fevers, weight loss, productive cough and dark urine.  She has been smoking since the age of 52 1 pack of cigarettes per day.   Initial work-up in the emergency room showed tachycardia, hypotension and tachypnea with normal oxygen saturations.  Lab work was fairly stable except for TSH 8.15, HCG 64 and a UA was positive for UTI.  Her urine pregnancy was positive.  EKG showed sinus tachycardia.  Chest x-ray showed 4.8 cm mass within the right upper lobe and CTA showed spiculated 4.7 cm right upper lobe mass consistent with malignancy.  There was diffuse interlobular septal thickening throughout the right lung concerning for lymphangitic spread of disease, large pericardial effusion and lymphadenopathy within the mediastinum and right supraclavicular region and left axilla.  Cardiology was consulted and performed a bedside echo showing cardiac tamponade physiology.  Due to progressive symptoms she was taken to the Cath Lab for pericardiocentesis and pericardial drain placement. Fluid was sent for analysis and cytology. She was admitted to ICU.   She was started on cefepime (HCAP/UTI) and vancomycin.  Blood cultures are pending.  Has not required vasopressors.    Plan is for close monitoring (frequent labs, vital signs),  transvaginal ultrasound due to elevated hCG levels and positive urine pregnancy and oncology and cardiology was consulted.    INTERVAL HISTORY: Patient had an eventful evening with significant paroxysmal atrial fibrillation last night with rates up to 180 bpm.  She was given extra amiodarone infusion.  No additional arrhythmias today.  Mild hypotension.  She had her pericardial drain removed yesterday.  Patient's daughter is at the bedside along with her boyfriend [name].  Nichole Park states that a doctor told her today that her lung mass is likely cancer although she was told her cytology from the fluid was not back.  She states that someone else told her this could be a bacterial or fungal infection as well.   REVIEW OF SYSTEMS:   Review of Systems  Constitutional:  Positive for malaise/fatigue. Negative for chills, fever and weight loss.  HENT:  Negative for congestion, ear pain and tinnitus.   Eyes: Negative.  Negative for blurred vision and double vision.  Respiratory:  Positive for cough and shortness of breath. Negative for sputum production.   Cardiovascular: Negative.  Negative for chest pain, palpitations and leg swelling.  Gastrointestinal: Negative.  Negative for abdominal pain, constipation, diarrhea, nausea and vomiting.  Genitourinary:  Negative for dysuria, frequency and urgency.  Musculoskeletal:  Negative for back pain and falls.  Skin: Negative.  Negative for rash.  Neurological:  Positive for weakness. Negative for headaches.  Endo/Heme/Allergies: Negative.  Does not bruise/bleed easily.  Psychiatric/Behavioral:  Negative for depression. The patient is nervous/anxious and has insomnia.    As per HPI. Otherwise, a complete review of systems is negative.  PAST MEDICAL HISTORY: Past Medical History:  Diagnosis Date  Graves disease     PAST SURGICAL HISTORY: Past Surgical History:  Procedure Laterality Date   PERICARDIOCENTESIS N/A 03/27/2021   Procedure:  PERICARDIOCENTESIS;  Surgeon: Nelva Bush, MD;  Location: [facility];  Service: Cardiovascular;  Laterality: N/A;   TUBAL LIGATION      FAMILY HISTORY: Family History  Problem Relation Age of Onset   Peripheral Artery Disease Mother     ADVANCED DIRECTIVES (Y/N):  @ADVDIR @  HEALTH MAINTENANCE: Social History   Tobacco Use   Smoking status: Every Day    Packs/day: 1.00    Years: 40.00    Pack years: 40.00    Types: Cigarettes   Smokeless tobacco: Never  Substance Use Topics   Alcohol use: No   Drug use: Yes    Types: Marijuana     Colonoscopy:  PAP:  Bone density:  Lipid panel:  Allergies  Allergen Reactions   Metoprolol    Tapazole [Methimazole]     Current Facility-Administered Medications  Medication Dose Route Frequency Provider Last Rate Last Admin   acetaminophen (TYLENOL) tablet 650 mg  650 mg Oral Q6H PRN End, Christopher, MD   650 mg at 03/28/21 2327   amiodarone (NEXTERONE PREMIX) 360-4.14 MG/200ML-% (1.8 mg/mL) IV infusion  30 mg/hr Intravenous Continuous Minna Merritts, MD 16.67 mL/hr at 03/30/21 1403 30 mg/hr at 03/30/21 1403   Chlorhexidine Gluconate Cloth 2 % PADS 6 each  6 each Topical Q0600 Bennie Pierini, MD   6 each at 03/28/21 0515   chlorpheniramine-HYDROcodone (TUSSIONEX) 10-8 MG/5ML suspension 5 mL  5 mL Oral Q12H PRN Gwynne Edinger, MD   5 mL at 03/29/21 2010   diltiazem (CARDIZEM) 125 mg in dextrose 5% 125 mL (1 mg/mL) infusion  5-15 mg/hr Intravenous Titrated Minna Merritts, MD   Stopped at 03/30/21 0904   docusate sodium (COLACE) capsule 100 mg  100 mg Oral BID PRN Lang Snow, NP   100 mg at 03/29/21 0816   enoxaparin (LOVENOX) injection 40 mg  40 mg Subcutaneous Q24H Gwynne Edinger, MD   40 mg at 03/29/21 2107   feeding supplement (ENSURE ENLIVE / ENSURE PLUS) liquid 237 mL  237 mL Oral TID BM Wouk, Ailene Rud, MD   237 mL at 03/30/21 1059   guaiFENesin (MUCINEX) 12 hr tablet 600 mg  600 mg  Oral BID PRN Gwynne Edinger, MD   600 mg at 03/30/21 1255   ipratropium (ATROVENT) nebulizer solution 0.5 mg  0.5 mg Nebulization TID Gwynne Edinger, MD   0.5 mg at 03/30/21 1402   ketorolac (TORADOL) 15 MG/ML injection 15 mg  15 mg Intravenous Q6H PRN Lang Snow, NP       ketorolac (TORADOL) 30 MG/ML injection 30 mg  30 mg Intravenous Once Lang Snow, NP       levalbuterol Penne Lash) nebulizer solution 0.63 mg  0.63 mg Nebulization TID Gwynne Edinger, MD   0.63 mg at 03/30/21 1402   LORazepam (ATIVAN) injection 1 mg  1 mg Intravenous Q6H PRN Wouk, Ailene Rud, MD       MEDLINE mouth rinse  15 mL Mouth Rinse BID Bennie Pierini, MD   15 mL at 03/29/21 2108   multivitamin with minerals tablet 1 tablet  1 tablet Oral Daily Gwynne Edinger, MD   1 tablet at 03/30/21 1059   nicotine (NICODERM CQ - dosed in mg/24 hours) patch 21 mg  21 mg Transdermal Daily Bennie Pierini, MD  21 mg at 03/28/21 1318   nystatin (MYCOSTATIN) 100000 UNIT/ML suspension 500,000 Units  5 mL Oral QID Darel Hong D, NP   500,000 Units at 03/30/21 1442   ondansetron (ZOFRAN) injection 4 mg  4 mg Intravenous Once End, Christopher, MD       polyethylene glycol (MIRALAX / GLYCOLAX) packet 17 g  17 g Oral Daily PRN Lang Snow, NP        OBJECTIVE: Vitals:   03/30/21 1300 03/30/21 1403  BP: 106/79   Pulse: (!) 121 98  Resp: 19 (!) 27  Temp:    SpO2: 92% 96%     Body mass index is 25.37 kg/m.    ECOG FS:1 - Symptomatic but completely ambulatory  Physical Exam Constitutional:      Appearance: Normal appearance.  HENT:     Head: Normocephalic and atraumatic.  Eyes:     Pupils: Pupils are equal, round, and reactive to light.  Cardiovascular:     Rate and Rhythm: Tachycardia present. Rhythm irregular.     Heart sounds: Normal heart sounds. No murmur heard. Pulmonary:     Effort: Pulmonary effort is normal.     Breath sounds: Normal breath sounds. No  wheezing.  Abdominal:     General: Bowel sounds are normal. There is no distension.     Palpations: Abdomen is soft.     Tenderness: There is no abdominal tenderness.  Musculoskeletal:        General: Normal range of motion.     Cervical back: Normal range of motion.  Skin:    General: Skin is warm and dry.     Findings: No rash.  Neurological:     Mental Status: She is alert and oriented to person, place, and time.  Psychiatric:        Judgment: Judgment normal.     LAB RESULTS:  Lab Results  Component Value Date   NA 132 (L) 03/30/2021   K 3.7 03/30/2021   CL 99 03/30/2021   CO2 25 03/30/2021   GLUCOSE 166 (H) 03/30/2021   BUN 9 03/30/2021   CREATININE 0.43 (L) 03/30/2021   CALCIUM 8.4 (L) 03/30/2021   PROT 7.7 03/27/2021   ALBUMIN 3.2 (L) 03/27/2021   AST 31 03/27/2021   ALT 21 03/27/2021   ALKPHOS 74 03/27/2021   BILITOT 1.2 03/27/2021   GFRNONAA >60 03/30/2021   GFRAA >60 03/19/2017    Lab Results  Component Value Date   WBC 16.7 (H) 03/29/2021   NEUTROABS 15.1 (H) 03/27/2021   HGB 14.7 03/29/2021   HCT 43.7 03/29/2021   MCV 86.5 03/29/2021   PLT 423 (H) 03/29/2021     STUDIES: DG Chest 1 View  Result Date: 03/30/2021 CLINICAL DATA:  Possible pneumothorax EXAM: CHEST  1 VIEW COMPARISON:  03/29/2021 FINDINGS: Spiculated right upper lobe lung mass with relative hyperlucency in the vicinity but without change, prior chest CT did not demonstrate corresponding abnormality other than emphysema. No discrete pleural line is seen. Small left pleural effusion. Mild cardiomegaly with increasing vascular congestion and pulmonary edema. Subsegmental atelectasis at the left lung base. Drainage catheter appears removed. IMPRESSION: 1. Mild cardiomegaly with increasing vascular congestion/mild pulmonary edema and small left effusion 2. Spiculated right upper lobe lung mass. Asymmetrical hyperlucency surrounding the mass without change and no definitive pleural line to  suggest pneumothorax. Electronically Signed   By: Donavan Foil M.D.   On: 03/30/2021 01:11   DG Chest 1 View  Result Date: 03/27/2021 CLINICAL  DATA:  Shortness of breath EXAM: CHEST  1 VIEW COMPARISON:  None. FINDINGS: Cardiomegaly. 4.8 cm mass projects within the right upper lobe. Mildly hyperexpanded lungs with coarsened interstitial markings bilaterally. No pleural effusion or pneumothorax. No acute bony findings. IMPRESSION: 1. 4.8 cm mass within the right upper lobe. CT chest with contrast is recommended for further evaluation. 2. Cardiomegaly. Electronically Signed   By: Davina Poke D.O.   On: 03/27/2021 15:52   CT Angio Chest PE W and/or Wo Contrast  Result Date: 03/27/2021 CLINICAL DATA:  Tachycardia, short of breath, pleuritic chest pain, abnormal chest x-ray EXAM: CT ANGIOGRAPHY CHEST WITH CONTRAST TECHNIQUE: Multidetector CT imaging of the chest was performed using the standard protocol during bolus administration of intravenous contrast. Multiplanar CT image reconstructions and MIPs were obtained to evaluate the vascular anatomy. CONTRAST:  1104mL OMNIPAQUE IOHEXOL 350 MG/ML SOLN COMPARISON:  03/27/2021 FINDINGS: Cardiovascular: This is a technically adequate evaluation of the pulmonary vasculature. No filling defects or pulmonary emboli. There is a large pericardial effusion measuring up to 2.3 cm in thickness. Uniform decreased attenuation suggest transudative effusion. Normal caliber of the thoracic aorta. Mild atherosclerosis of the aorta and coronary vasculature. Mediastinum/Nodes: Pathologically enlarged lymph nodes are seen within the mediastinum, measuring up to 13 mm in short axis reference image 24/4. Adenopathy is also seen within the right supraclavicular region and left axilla. Thyroid, trachea, and esophagus are grossly unremarkable. Lungs/Pleura: There is a large spiculated right upper lobe mass corresponding to the chest x-ray finding, measuring 4.7 x 3.8 by 4.0 cm. Diffuse  interlobular septal thickening throughout the right lung concerning for lymphangitic spread of disease. There is background emphysema. No effusion or pneumothorax. Central airways are patent. Upper Abdomen: No acute abnormality. Musculoskeletal: No acute or destructive bony lesions. Reconstructed images demonstrate no additional findings. Review of the MIP images confirms the above findings. IMPRESSION: 1. 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 Diffuse interlobular septal thickening throughout the right lung concerning for lymphangitic spread of disease. 2. Large pericardial effusion. 3. Lymphadenopathy within the mediastinum, right supraclavicular region, and left axilla, concerning for metastatic disease. 4. No evidence of pulmonary embolus. 5. Aortic Atherosclerosis (ICD10-I70.0) and Emphysema (ICD10-J43.9). Electronically Signed   By: Randa Ngo M.D.   On: 03/27/2021 18:24   CARDIAC CATHETERIZATION  Result Date: 03/27/2021 Conclusions: Successful pericardiocentesis and tube pericardiotomy via the subxiphoid approach, yielding 500 mL of serosanginous fluid. Recommendations: Admit to ICU. Obtain STAT portable CXR. Follow-up fluid analyses, including cytology. Repeat echocardiogram tomorrow.  Anticipate removal of drain when output is less than 50-100 mL/24 hours and minimal residual fluid is present by echo. Nelva Bush, MD [facility]  US Renal  Result Date: 03/27/2021 CLINICAL DATA:  UTI EXAM: RENAL / URINARY TRACT ULTRASOUND COMPLETE COMPARISON:  CT 03/27/2021 FINDINGS: Right Kidney: Renal measurements: 10.9 x 4 x 4.4 cm = volume: 101 mL. Echogenicity within normal limits. No mass or hydronephrosis visualized. Left Kidney: Renal measurements: 11.1 x 4.9 x 5.8 cm = volume: 165 mL. Echogenicity within normal limits. No mass or hydronephrosis visualized. Bladder: Appears normal for degree of bladder distention. Other: None. IMPRESSION: Negative renal ultrasound  Electronically Signed   By: Donavan Foil M.D.   On: 03/27/2021 19:37   DG Chest Port 1 View  Result Date: 03/29/2021 CLINICAL DATA:  Shortness of breath and chest pain EXAM: PORTABLE CHEST 1 VIEW COMPARISON:  03/27/2021 FINDINGS: Cardiac shadow is mildly enlarged but stable. Spiculated right upper lobe mass lesion is again  noted. Mild increase in parenchymal edema is seen. Small bore catheter is again noted over the cardiac shadow likely related to a pericardial drain. No pneumothorax is noted. IMPRESSION: Increasing edema particularly on the right. Stable right upper lobe mass lesion. No new focal abnormality is noted. Electronically Signed   By: Inez Catalina M.D.   On: 03/29/2021 09:02   DG Chest Port 1 View  Result Date: 03/27/2021 CLINICAL DATA:  Cardiac tamponade EXAM: PORTABLE CHEST 1 VIEW COMPARISON:  Chest x-ray 03/27/2021, CT chest 03/27/2021 FINDINGS: Right upper lobe lung mass redemonstrated. Emphysema with bronchitic changes. Cardiomegaly. Interim placement of drainage catheter over the heart presumably a pericardial drainage catheter. Cardiac silhouette decreased compared to prior. Slight increased interstitial opacity possibly superimposed edema. No pneumothorax IMPRESSION: 1. Placement of presumed pericardial drainage catheter with decreased cardiomegaly 2. Emphysema with chronic bronchitic change, slight increased interstitial opacity may reflect superimposed mild edema 3. Redemonstrated spiculated lung mass at the right apex Electronically Signed   By: Donavan Foil M.D.   On: 03/27/2021 22:28   ECHOCARDIOGRAM COMPLETE  Result Date: 03/28/2021    ECHOCARDIOGRAM REPORT   Patient Name:   XIARA KNISLEY Date of Exam: 03/28/2021 Medical Rec #:  [id number]   Height:       61.5 in Accession #:    [id number]  Weight:       128.7 lb Date of Birth:  September 22, 1960  BSA:          1.576 m Patient Age:    27 years    BP:           128/88 mmHg Patient Gender: F           HR:           105 bpm. Exam  Location:  [facility] Procedure: 2D Echo, Color Doppler, Cardiac Doppler and Intracardiac            Opacification Agent Indications:     I31.4 Tamponade  History:         Patient has no prior history of Echocardiogram examinations.                  Signs/Symptoms:Shortness of Breath; Risk Factors:Current                  Smoker. Graves disease.  Sonographer:     Charmayne Sheer RDCS (AE) Referring Phys:  [id number] CHRISTOPHER END Diagnosing Phys: Kate Sable MD  Sonographer Comments: Technically difficult study due to poor echo windows. IMPRESSIONS  1. Left ventricular ejection fraction, by estimation, is 55%. The left ventricle has normal function. The left ventricle has no regional wall motion abnormalities. Left ventricular diastolic parameters are consistent with Grade II diastolic dysfunction (pseudonormalization).  2. Right ventricular systolic function is normal. The right ventricular size is normal.  3. The mitral valve is normal in structure. No evidence of mitral valve regurgitation.  4. The aortic valve was not well visualized. Aortic valve regurgitation is not visualized.  5. The inferior vena cava is dilated in size with >50% respiratory variability, suggesting right atrial pressure of 8 mmHg. FINDINGS  Left Ventricle: Left ventricular ejection fraction, by estimation, is 55%. The left ventricle has normal function. The left ventricle has no regional wall motion abnormalities. Definity contrast agent was given IV to delineate the left ventricular endocardial borders. The left ventricular internal cavity size was normal in size. There is no left ventricular hypertrophy. Left ventricular diastolic parameters are consistent with Grade II diastolic dysfunction (  pseudonormalization). Right Ventricle: The right ventricular size is normal. No increase in right ventricular wall thickness. Right ventricular systolic function is normal. Left Atrium: Left atrial size was normal in size. Right Atrium: Right atrial size was  normal in size. Pericardium: Trivial pericardial effusion is present. Mitral Valve: The mitral valve is normal in structure. No evidence of mitral valve regurgitation. Tricuspid Valve: The tricuspid valve is not well visualized. Tricuspid valve regurgitation is not demonstrated. Aortic Valve: The aortic valve was not well visualized. Aortic valve regurgitation is not visualized. Aortic valve mean gradient measures 2.0 mmHg. Aortic valve peak gradient measures 4.7 mmHg. Aortic valve area, by VTI measures 3.44 cm. Pulmonic Valve: The pulmonic valve was not well visualized. Pulmonic valve regurgitation is not visualized. Aorta: The aortic root is normal in size and structure. Venous: The inferior vena cava is dilated in size with greater than 50% respiratory variability, suggesting right atrial pressure of 8 mmHg. IAS/Shunts: No atrial level shunt detected by color flow Doppler.  LEFT VENTRICLE PLAX 2D LVIDd:         3.90 cm  Diastology LVIDs:         3.30 cm  LV e' medial:    9.46 cm/s LV PW:         1.20 cm  LV E/e' medial:  8.9 LV IVS:        0.90 cm  LV e' lateral:   4.57 cm/s LVOT diam:     2.20 cm  LV E/e' lateral: 18.4 LV SV:         54 LV SV Index:   34 LVOT Area:     3.80 cm  LEFT ATRIUM           Index LA diam:      3.70 cm 2.35 cm/m LA Vol (A4C): 38.0 ml 24.11 ml/m  AORTIC VALVE                   PULMONIC VALVE AV Area (Vmax):    3.40 cm    PV Vmax:       0.97 m/s AV Area (Vmean):   3.66 cm    PV Vmean:      65.700 cm/s AV Area (VTI):     3.44 cm    PV VTI:        0.122 m AV Vmax:           108.00 cm/s PV Peak grad:  3.7 mmHg AV Vmean:          71.900 cm/s PV Mean grad:  2.0 mmHg AV VTI:            0.156 m AV Peak Grad:      4.7 mmHg AV Mean Grad:      2.0 mmHg LVOT Vmax:         96.60 cm/s LVOT Vmean:        69.300 cm/s LVOT VTI:          0.141 m LVOT/AV VTI ratio: 0.90  AORTA Ao Root diam: 2.80 cm MITRAL VALVE MV Area (PHT): 5.07 cm    SHUNTS MV Decel Time: 150 msec    Systemic VTI:  0.14 m MV E  velocity: 84.23 cm/s  Systemic Diam: 2.20 cm MV A velocity: 88.23 cm/s MV E/A ratio:  0.95 Kate Sable MD Electronically signed by Kate Sable MD Signature Date/Time: 03/28/2021/1:51:00 PM    Final    ECHOCARDIOGRAM LIMITED  Result Date: 03/29/2021    ECHOCARDIOGRAM LIMITED REPORT  Patient Name:   LEITH HEDLUND Date of Exam: 03/29/2021 Medical Rec #:  [id number]   Height:       61.5 in Accession #:    [id number]  Weight:       136.5 lb Date of Birth:  07/14/1961  BSA:          1.615 m Patient Age:    89 years    BP:           137/91 mmHg Patient Gender: F           HR:           116 bpm. Exam Location:  [facility] Procedure: Cardiac Doppler, Color Doppler and Limited Echo Indications:     Pericardial Effusion I31.3  History:         Patient has prior history of Echocardiogram examinations, most                  recent 03/28/2021. Risk Factors:Hypertension. Cardiac tamponade.  Sonographer:     Sherrie Sport RDCS (AE) Referring Phys:  [id number] [place] Diagnosing Phys: Ida Rogue MD  Sonographer Comments: Technically difficult study due to poor echo windows. IMPRESSIONS  1. Trvial pericardial effusion.  2. Left ventricular ejection fraction, by estimation, is 60 to 65%. The left ventricle has normal function. The left ventricle has no regional wall motion abnormalities.  3. Right ventricular systolic function is normal. The right ventricular size is normal.  4. The mitral valve is normal in structure. No evidence of mitral valve regurgitation. No evidence of mitral stenosis. FINDINGS  Left Ventricle: Left ventricular ejection fraction, by estimation, is 60 to 65%. The left ventricle has normal function. The left ventricle has no regional wall motion abnormalities. The left ventricular internal cavity size was normal in size. There is  no left ventricular hypertrophy. Right Ventricle: The right ventricular size is normal. No increase in right ventricular wall thickness. Right ventricular systolic function  is normal. Left Atrium: Left atrial size was normal in size. Right Atrium: Right atrial size was normal in size. Pericardium: Trivial pericardial effusion is present. Mitral Valve: The mitral valve is normal in structure. No evidence of mitral valve stenosis. Tricuspid Valve: The tricuspid valve is normal in structure. Tricuspid valve regurgitation is not demonstrated. No evidence of tricuspid stenosis. Aortic Valve: The aortic valve was not well visualized. Aortic valve regurgitation is not visualized. No aortic stenosis is present. Pulmonic Valve: The pulmonic valve was normal in structure. Pulmonic valve regurgitation is not visualized. No evidence of pulmonic stenosis. Aorta: The aortic root is normal in size and structure. Venous: The inferior vena cava is normal in size with greater than 50% respiratory variability, suggesting right atrial pressure of 3 mmHg. IAS/Shunts: No atrial level shunt detected by color flow Doppler. LEFT VENTRICLE PLAX 2D LVIDd:         4.04 cm LVIDs:         2.55 cm LV PW:         1.31 cm LV IVS:        0.93 cm LVOT diam:     2.00 cm LVOT Area:     3.14 cm  LEFT ATRIUM           Index      RIGHT ATRIUM           Index LA diam:      3.10 cm 1.92 cm/m RA Area:     10.20 cm LA Vol (A4C): 11.2  ml 6.93 ml/m RA Volume:   23.00 ml  14.24 ml/m   AORTA Ao Root diam: 2.80 cm TRICUSPID VALVE TR Peak grad:   4.8 mmHg TR Vmax:        110.00 cm/s  SHUNTS Systemic Diam: 2.00 cm Ida Rogue MD Electronically signed by Ida Rogue MD Signature Date/Time: 03/29/2021/3:32:05 PM    Final     ASSESSMENT:  Nichole Park is a 60 year old female who presented to the emergency room on 03/27/2021 for weakness, progressive shortness of breath and chest pain.  She was found to have pericardial effusion (status post pericardiocentesis and dain) causing cardiac tamponade and a large lung mass.   Lung Mass: CT chest showed a 4.7 cm right upper lobe mass consistent with malignancy with diffuse interlobular  septal thickening throughout the right lung concerning for lymphangitic spread of disease and large pericardial effusion with lymphadenopathy within the mediastinum, right supraclavicular region and left axilla. Fluid analysis/cytology is positive for malignancy. Once she is stable, she will need biopsy for confirmation.   Sepsis due to UTI: Currently on Maxipime and prophylactically on vancomycin. Blood cultures negative. Lactic acid is improving.   Pericardial effusion with cardiac tamponade: Secondary to lung mass. Had pericardiocentesis  with 400 mL of fluid removed with drain placement-analysis and cytology pending. Drain was removed yesterday.  She is requiring 2 L of oxygen. She has had episodes of intermittent atrial fibrillation with RVR. She was started on diltiazem by Dr. Rockey Situ with conversion to normal sinus rhythm.  Given her high risk for recurrent arrhythmias she continues amiodarone infusion.   Graves' disease: TSH 8.150. 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 Will discuss with Dr. Patsey Berthold for outpatient versus inpatient if patient is stable. She will then need follow-up with medical oncology as outpatient.  I spent 25 minutes dedicated to the care of this patient (face-to-face and non-face-to-face) on the date of the encounter to include what is described in the assessment and plan.   Patient expressed understanding and was in agreement with this plan. She also understands that She can call clinic at any time with any questions, concerns, or complaints.   Cancer Staging No matching staging information was found for the patient.  Jacquelin Hawking, NP   03/30/2021 4:28 PM

## 2021-03-30 NOTE — Progress Notes (Signed)
PROGRESS NOTE    Nichole Park  XBM:[id number] DOB: 1961-02-22 DOA: 03/27/2021 PCP: Pcp, No  Outpatient Specialists: none    Brief Narrative:   Hx graves disease, presenting with shortness of breath and chest discomfort, found to have large right upper lobar mass and pericardial effusion w/ tamponade physiology. S/p pericardial drain.    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 S/p pericardiocentesis with 400 ml drainage. Current drainage minimal. Remains tachypnic and SOB. CTA neg. She is a heavy smoker. TTE shows preserved EF, diastolic dysfunction. - f/u cytology and cultures - if fluid analysis unrevealing, Dr. Patsey Berthold of pulm advises close outpt f/u for EBUS - mgmt of effusion per cardiology who is following  # A fib w/ rvr # Acute hypoxic respiratory failure Difficulty controlling hr yesterday with dilt, amio added. Hypotensive today, hr controlled. CXR this am shows pulmonary edema, likely 2/2 a fib - cont amio, dilt, per cardiology - will give lasix 40 iv once  # Acute cystitis? # sepsis ruled out Initially tachycardic, leukocytosis, elevated lactate. Urine suggestive of infection, culture negative.  - cont ceftriaxone for now.  # Graves Disease TSH wnl, T4 mildly elevated - outpt endo f/u  # Hyperglycemia Mild. A1c 6.2   DVT prophylaxis: lovenox Code Status: full Family Communication: husband updated @ bedside 7/22  Level of care: Stepdown Status is: Inpatient  Remains inpatient appropriate because:Inpatient level of care appropriate due to severity of illness  Dispo: The patient is from: Home              Anticipated d/c is to: Home, ordering PT eval              Patient  currently is not medically stable to d/c.   Difficult to place patient No        Consultants:  Cardiology, oncology  Procedures: Pericardial drain placement, now removed  Antimicrobials:  Cefepime>ceftriaxone    Subjective: This morning some cough, non-productive. No abd pain. No chest pain  Objective: Vitals:   03/30/21 0500 03/30/21 0530 03/30/21 0545 03/30/21 0832  BP: 103/80 96/75 90/67    Pulse: 81 80 79 92  Resp: 20 (!) 23 (!) 21 (!) 23  Temp:      TempSrc:      SpO2: 93% (!) 88% 93% 93%  Weight:      Height:        Intake/Output Summary (Last 24 hours) at 03/30/2021 1023 Last data filed at 03/30/2021 0535 Gross per 24 hour  Intake 616.2 ml  Output 400 ml  Net 216.2 ml   Filed Weights   03/28/21 0500 03/29/21 0500  Weight: 58.4 kg 61.9 kg    Examination:  General exam: chronically ill appearing Respiratory system: decreased breath sounds right upper, rales at bases Cardiovascular system: S1 & S2 heard, soft systolic murmur Gastrointestinal system: Abdomen is nondistended, soft and nontender. No organomegaly or masses felt. Normal bowel sounds heard. Central nervous system: Alert and oriented. No focal neurological deficits. Extremities: Symmetric 5 x 5 power. Skin: No rashes, lesions or ulcers Psychiatry: Judgement and insight appear normal. Mood & affect appropriate.     Data Reviewed: I have personally reviewed following labs and imaging studies  CBC: Recent  Labs  Lab 03/27/21 1502 03/28/21 0131 03/29/21 0413  WBC 21.1*  21.7* 20.3* 16.7*  NEUTROABS 15.1*  --   --   HGB 15.6*  15.7* 13.8 14.7  HCT 46.6*  45.4 40.5 43.7  MCV 88.4  88.0 87.7 86.5  PLT 490*  467* 377 063*   Basic Metabolic Panel: Recent Labs  Lab 03/27/21 1502 03/28/21 0131 03/29/21 0413 03/30/21 0452  NA 131* 135 139 132*  K 4.5 4.1 3.6 3.7  CL 98 104 106 99  CO2 20* 23 25 25   GLUCOSE 144* 120* 113* 166*  BUN 34* 22* 10 9  CREATININE 0.81 0.58 0.40* 0.43*   CALCIUM 9.4 8.6* 8.7* 8.4*  MG  --  2.2  --   --   PHOS  --  3.3  --   --    GFR: Estimated Creatinine Clearance: 66.4 mL/min (A) (by C-G formula based on SCr of 0.43 mg/dL (L)). Liver Function Tests: Recent Labs  Lab 03/27/21 1635  AST 31  ALT 21  ALKPHOS 74  BILITOT 1.2  PROT 7.7  ALBUMIN 3.2*   No results for input(s): LIPASE, AMYLASE in the last 168 hours. No results for input(s): AMMONIA in the last 168 hours. Coagulation Profile: Recent Labs  Lab 03/27/21 1701  INR 1.1   Cardiac Enzymes: Recent Labs  Lab 03/27/21 1635  CKTOTAL 15*   BNP (last 3 results) No results for input(s): PROBNP in the last 8760 hours. HbA1C: Recent Labs    03/29/21 0948  HGBA1C 6.2*   CBG: Recent Labs  Lab 03/27/21 2138  GLUCAP 101*   Lipid Profile: No results for input(s): CHOL, HDL, LDLCALC, TRIG, CHOLHDL, LDLDIRECT in the last 72 hours. Thyroid Function Tests: Recent Labs    03/28/21 0131  TSH 3.020  FREET4 1.22*  T3FREE 2.0   Anemia Panel: No results for input(s): VITAMINB12, FOLATE, FERRITIN, TIBC, IRON, RETICCTPCT in the last 72 hours. Urine analysis:    Component Value Date/Time   COLORURINE AMBER (A) 03/27/2021 1701   APPEARANCEUR CLOUDY (A) 03/27/2021 1701   APPEARANCEUR Clear 12/01/2013 1439   LABSPEC 1.028 03/27/2021 1701   LABSPEC 1.005 12/01/2013 1439   PHURINE 5.0 03/27/2021 1701   GLUCOSEU 50 (A) 03/27/2021 1701   GLUCOSEU Negative 12/01/2013 1439   HGBUR NEGATIVE 03/27/2021 1701   BILIRUBINUR SMALL (A) 03/27/2021 1701   BILIRUBINUR Negative 12/01/2013 1439   KETONESUR NEGATIVE 03/27/2021 1701   PROTEINUR 30 (A) 03/27/2021 1701   NITRITE NEGATIVE 03/27/2021 1701   LEUKOCYTESUR MODERATE (A) 03/27/2021 1701   LEUKOCYTESUR 1+ 12/01/2013 1439   Sepsis Labs: @LABRCNTIP (procalcitonin:4,lacticidven:4)  ) Recent Results (from the past 240 hour(s))  Blood culture (routine single)     Status: None (Preliminary result)   Collection Time: 03/27/21   5:01 PM   Specimen: BLOOD  Result Value Ref Range Status   Specimen Description BLOOD RIGHT ANTECUBITAL  Final   Special Requests   Final    BOTTLES DRAWN AEROBIC AND ANAEROBIC Blood Culture adequate volume   Culture   Final    NO GROWTH 3 DAYS Performed at [facility], [address], [place],  [address]    Report Status PENDING  Incomplete  Resp Panel by RT-PCR (Flu A&B, Covid) Nasopharyngeal Swab     Status: None   Collection Time: 03/27/21  5:01 PM   Specimen: Nasopharyngeal Swab; Nasopharyngeal(NP) swabs in vial transport medium  Result Value Ref Range Status   SARS Coronavirus 2 by RT PCR NEGATIVE NEGATIVE  Final    Comment: (NOTE) SARS-CoV-2 target nucleic acids are NOT DETECTED.  The SARS-CoV-2 RNA is generally detectable in upper respiratory specimens during the acute phase of infection. The lowest concentration of SARS-CoV-2 viral copies this assay can detect is 138 copies/mL. A negative result does not preclude SARS-Cov-2 infection and should not be used as the sole basis for treatment or other patient management decisions. A negative result may occur with  improper specimen collection/handling, submission of specimen other than nasopharyngeal swab, presence of viral mutation(s) within the areas targeted by this assay, and inadequate number of viral copies(<138 copies/mL). A negative result must be combined with clinical observations, patient history, and epidemiological information. The expected result is Negative.  Fact Sheet for Patients:  EntrepreneurPulse.com.au  Fact Sheet for Healthcare Providers:  IncredibleEmployment.be  This test is no t yet approved or cleared by the [place] FDA and  has been authorized for detection and/or diagnosis of SARS-CoV-2 by FDA under an Emergency Use Authorization (EUA). This EUA will remain  in effect (meaning this test can be used) for the duration of the COVID-19  declaration under Section 564(b)(1) of the Act, 21 U.S.C.section 360bbb-3(b)(1), unless the authorization is terminated  or revoked sooner.       Influenza A by PCR NEGATIVE NEGATIVE Final   Influenza B by PCR NEGATIVE NEGATIVE Final    Comment: (NOTE) The Xpert Xpress SARS-CoV-2/FLU/RSV plus assay is intended as an aid in the diagnosis of influenza from Nasopharyngeal swab specimens and should not be used as a sole basis for treatment. Nasal washings and aspirates are unacceptable for Xpert Xpress SARS-CoV-2/FLU/RSV testing.  Fact Sheet for Patients: EntrepreneurPulse.com.au  Fact Sheet for Healthcare Providers: IncredibleEmployment.be  This test is not yet approved or cleared by the [place] FDA and has been authorized for detection and/or diagnosis of SARS-CoV-2 by FDA under an Emergency Use Authorization (EUA). This EUA will remain in effect (meaning this test can be used) for the duration of the COVID-19 declaration under Section 564(b)(1) of the Act, 21 U.S.C. section 360bbb-3(b)(1), unless the authorization is terminated or revoked.  Performed at [facility], [address] [place], [place] [address]   Urine Culture     Status: Abnormal   Collection Time: 03/27/21  5:01 PM   Specimen: In/Out Cath Urine  Result Value Ref Range Status   Specimen Description   Final    IN/OUT CATH URINE Performed at [facility], [address] [place], [place] [address]    Special Requests   Final    NONE Performed at [facility], [address] [place], [place] [address]    Culture (A)  Final    <10,000 COLONIES/mL INSIGNIFICANT GROWTH Performed at [facility] [address] [place], [place] [address]    Report Status 03/29/2021 FINAL  Final  Blood culture (single)     Status: None (Preliminary result)   Collection Time: 03/27/21  6:05 PM   Specimen: BLOOD  Result Value Ref Range Status    Specimen Description BLOOD LEFT ANTECUBITAL  Final   Special Requests   Final    BOTTLES DRAWN AEROBIC AND ANAEROBIC Blood Culture results may not be optimal due to an inadequate volume of blood received in culture bottles   Culture   Final    NO GROWTH 3 DAYS Performed at [facility], [address] [place],  [address]    Report Status PENDING  Incomplete  Body fluid culture w Gram  Stain     Status: None (Preliminary result)   Collection Time: 03/27/21  8:26 PM   Specimen: Pericardial; Body Fluid  Result Value Ref Range Status   Specimen Description   Final    PERICARDIAL Performed at [facility], [address] [place], [place] [address]    Special Requests   Final    NONE Performed at [facility], [address] [place], [place] [address]    Gram Stain   Final    RARE WBC PRESENT,BOTH PMN AND MONONUCLEAR NO ORGANISMS SEEN    Culture   Final    NO GROWTH 2 DAYS Performed at [facility], [place] [address] [place], [place] [address]    Report Status PENDING  Incomplete  Acid Fast Smear (AFB)     Status: None   Collection Time: 03/27/21  8:26 PM   Specimen: Pericardial Fluid  Result Value Ref Range Status   AFB Specimen Processing Concentration  Final   Acid Fast Smear Negative  Final    Comment: (NOTE) Performed At: [facility] [place], [place] [id number] Rush Farmer MD HM:[id number]    Source (AFB) FLUID  Final    Comment: Performed at [facility], [address] [place], [place] [address]  Culture, blood (routine x 2)     Status: None (Preliminary result)   Collection Time: 03/27/21 10:18 PM   Specimen: BLOOD RIGHT FOREARM  Result Value Ref Range Status   Specimen Description BLOOD RIGHT FOREARM  Final   Special Requests   Final    BOTTLES DRAWN AEROBIC AND ANAEROBIC Blood Culture adequate volume   Culture   Final    NO GROWTH 3 DAYS Performed at [facility], [address], [place], [place] [address]    Report Status PENDING  Incomplete  MRSA Next Gen by PCR, Nasal     Status: None   Collection Time: 03/27/21 11:01 PM   Specimen: Nasal Mucosa; Nasal Swab  Result Value Ref Range Status   MRSA by PCR Next Gen NOT DETECTED NOT DETECTED Final    Comment: (NOTE) The GeneXpert MRSA Assay (FDA approved for NASAL specimens only), is one component of a comprehensive MRSA colonization surveillance program. It is not intended to diagnose MRSA infection nor to guide or monitor treatment for MRSA infections. Test performance is not FDA approved in patients less than 31 years old. Performed at [facility], [address], [place], [place] [address]          Radiology Studies: DG Chest 1 View  Result Date: 03/30/2021 CLINICAL DATA:  Possible pneumothorax EXAM: CHEST  1 VIEW COMPARISON:  03/29/2021 FINDINGS: Spiculated right upper lobe lung mass with relative hyperlucency in the vicinity but without change, prior chest CT did not demonstrate corresponding abnormality other than emphysema. No discrete pleural line is seen. Small left pleural effusion. Mild cardiomegaly with increasing vascular congestion and pulmonary edema. Subsegmental atelectasis at the left lung base. Drainage catheter appears removed. IMPRESSION: 1. Mild cardiomegaly with increasing vascular congestion/mild pulmonary edema and small left effusion 2. Spiculated right upper lobe lung mass. Asymmetrical hyperlucency surrounding the mass without change and no definitive pleural line to suggest pneumothorax. Electronically Signed   By: Donavan Foil M.D.   On: 03/30/2021 01:11   DG Chest Port 1 View  Result Date: 03/29/2021 CLINICAL DATA:  Shortness of breath and chest pain EXAM: PORTABLE CHEST 1 VIEW COMPARISON:  03/27/2021 FINDINGS: Cardiac shadow is mildly  enlarged but stable. Spiculated right upper lobe mass lesion is again noted. Mild increase in parenchymal edema is seen. Small  bore catheter is again noted over the cardiac shadow likely related to a pericardial drain. No pneumothorax is noted. IMPRESSION: Increasing edema particularly on the right. Stable right upper lobe mass lesion. No new focal abnormality is noted. Electronically Signed   By: Inez Catalina M.D.   On: 03/29/2021 09:02   ECHOCARDIOGRAM LIMITED  Result Date: 03/29/2021    ECHOCARDIOGRAM LIMITED REPORT   Patient Name:   GERALINE HALBERSTADT Date of Exam: 03/29/2021 Medical Rec #:  [id number]   Height:       61.5 in Accession #:    [id number]  Weight:       136.5 lb Date of Birth:  11/04/1960  BSA:          1.615 m Patient Age:    60 years    BP:           137/91 mmHg Patient Gender: F           HR:           116 bpm. Exam Location:  [facility] Procedure: Cardiac Doppler, Color Doppler and Limited Echo Indications:     Pericardial Effusion I31.3  History:         Patient has prior history of Echocardiogram examinations, most                  recent 03/28/2021. Risk Factors:Hypertension. Cardiac tamponade.  Sonographer:     Sherrie Sport RDCS (AE) Referring Phys:  [id number] [place] Diagnosing Phys: Ida Rogue MD  Sonographer Comments: Technically difficult study due to poor echo windows. IMPRESSIONS  1. Trvial pericardial effusion.  2. Left ventricular ejection fraction, by estimation, is 60 to 65%. The left ventricle has normal function. The left ventricle has no regional wall motion abnormalities.  3. Right ventricular systolic function is normal. The right ventricular size is normal.  4. The mitral valve is normal in structure. No evidence of mitral valve regurgitation. No evidence of mitral stenosis. FINDINGS  Left Ventricle: Left ventricular ejection fraction, by estimation, is 60 to 65%. The left ventricle has normal function. The left ventricle has no regional wall motion abnormalities. The left ventricular internal cavity size was normal in size. There is  no left ventricular hypertrophy. Right Ventricle: The right  ventricular size is normal. No increase in right ventricular wall thickness. Right ventricular systolic function is normal. Left Atrium: Left atrial size was normal in size. Right Atrium: Right atrial size was normal in size. Pericardium: Trivial pericardial effusion is present. Mitral Valve: The mitral valve is normal in structure. No evidence of mitral valve stenosis. Tricuspid Valve: The tricuspid valve is normal in structure. Tricuspid valve regurgitation is not demonstrated. No evidence of tricuspid stenosis. Aortic Valve: The aortic valve was not well visualized. Aortic valve regurgitation is not visualized. No aortic stenosis is present. Pulmonic Valve: The pulmonic valve was normal in structure. Pulmonic valve regurgitation is not visualized. No evidence of pulmonic stenosis. Aorta: The aortic root is normal in size and structure. Venous: The inferior vena cava is normal in size with greater than 50% respiratory variability, suggesting right atrial pressure of 3 mmHg. IAS/Shunts: No atrial level shunt detected by color flow Doppler. LEFT VENTRICLE PLAX 2D LVIDd:         4.04 cm LVIDs:         2.55 cm LV PW:  1.31 cm LV IVS:        0.93 cm LVOT diam:     2.00 cm LVOT Area:     3.14 cm  LEFT ATRIUM           Index      RIGHT ATRIUM           Index LA diam:      3.10 cm 1.92 cm/m RA Area:     10.20 cm LA Vol (A4C): 11.2 ml 6.93 ml/m RA Volume:   23.00 ml  14.24 ml/m   AORTA Ao Root diam: 2.80 cm TRICUSPID VALVE TR Peak grad:   4.8 mmHg TR Vmax:        110.00 cm/s  SHUNTS Systemic Diam: 2.00 cm Ida Rogue MD Electronically signed by Ida Rogue MD Signature Date/Time: 03/29/2021/3:32:05 PM    Final         Scheduled Meds:  Chlorhexidine Gluconate Cloth  6 each Topical Q0600   enoxaparin (LOVENOX) injection  40 mg Subcutaneous Q24H   feeding supplement  237 mL Oral TID BM   ipratropium  0.5 mg Nebulization TID   ketorolac  30 mg Intravenous Once   levalbuterol  0.63 mg Nebulization  TID   mouth rinse  15 mL Mouth Rinse BID   multivitamin with minerals  1 tablet Oral Daily   nicotine  21 mg Transdermal Daily   nystatin  5 mL Oral QID   ondansetron (ZOFRAN) IV  4 mg Intravenous Once   Continuous Infusions:  amiodarone     diltiazem (CARDIZEM) infusion 5 mg/hr (03/30/21 0535)     LOS: 3 days    Time spent: 30 min    Desma Maxim, MD [facility]   If 7PM-7AM, please contact night-coverage [email or web address] Password [facility] 03/30/2021, 10:23 AM

## 2021-03-31 ENCOUNTER — Other Ambulatory Visit: Payer: Self-pay

## 2021-03-31 LAB — BASIC METABOLIC PANEL
Anion gap: 8 (ref 5–15)
BUN: 10 mg/dL (ref 6–20)
CO2: 28 mmol/L (ref 22–32)
Calcium: 8.5 mg/dL — ABNORMAL LOW (ref 8.9–10.3)
Chloride: 95 mmol/L — ABNORMAL LOW (ref 98–111)
Creatinine, Ser: 0.48 mg/dL (ref 0.44–1.00)
GFR, Estimated: >60 mL/min (ref >60)
Glucose, Bld: 152 mg/dL — ABNORMAL HIGH (ref 70–99)
Potassium: 3.8 mmol/L (ref 3.5–5.1)
Sodium: 131 mmol/L — ABNORMAL LOW (ref 135–145)

## 2021-03-31 LAB — BODY FLUID CULTURE W GRAM STAIN: Culture: NO GROWTH

## 2021-03-31 MED ORDER — GUAIFENESIN-DM 100-10 MG/5ML PO SYRP
5.0000 mL | ORAL_SOLUTION | Freq: Three times a day (TID) | ORAL | Status: DC | PRN
  Administered 2021-03-31 – 2021-04-01 (×3): 5 mL via ORAL
  Filled 2021-03-31 (×3): qty 5

## 2021-03-31 MED ORDER — BUDESONIDE 0.25 MG/2ML IN SUSP
0.2500 mg | Freq: Two times a day (BID) | RESPIRATORY_TRACT | Status: DC
  Administered 2021-03-31 – 2021-04-10 (×16): 0.25 mg via RESPIRATORY_TRACT
  Filled 2021-03-31 (×20): qty 2

## 2021-03-31 MED ORDER — AMIODARONE IV BOLUS ONLY 150 MG/100ML
150.0000 mg | Freq: Once | INTRAVENOUS | Status: AC
  Administered 2021-03-31: 150 mg via INTRAVENOUS
  Filled 2021-03-31: qty 100

## 2021-03-31 MED ORDER — FUROSEMIDE 10 MG/ML IJ SOLN
60.0000 mg | Freq: Every day | INTRAMUSCULAR | Status: DC
  Administered 2021-03-31 – 2021-04-01 (×2): 60 mg via INTRAVENOUS
  Filled 2021-03-31 (×2): qty 6

## 2021-03-31 MED ORDER — DILTIAZEM HCL-DEXTROSE 125-5 MG/125ML-% IV SOLN (PREMIX)
2.5000 mg/h | INTRAVENOUS | Status: DC
Start: 2021-03-31 — End: 2021-04-04
  Administered 2021-03-31: 2.5 mg/h via INTRAVENOUS
  Filled 2021-03-31: qty 125

## 2021-03-31 MED ORDER — DILTIAZEM LOAD VIA INFUSION
7.5000 mg | Freq: Once | INTRAVENOUS | Status: DC
  Filled 2021-03-31: qty 8

## 2021-03-31 NOTE — Progress Notes (Signed)
PROGRESS NOTE    Nichole Park  JQB:[id number] DOB: [birth year] DOA: 03/27/2021 PCP: Pcp, No  Outpatient Specialists: none    Brief Narrative:   Hx graves disease, presenting with shortness of breath and chest discomfort, found to have large right upper lobar mass and pericardial effusion w/ tamponade physiology. S/p pericardial drain.    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 S/p pericardiocentesis with 400 ml drainage. Drain has since been removed. Cytology confirms malignancy. Remains tachypnic and SOB. CTA neg. She is a heavy smoker. TTE shows preserved EF, diastolic dysfunction. - EBUS with biopsy when patient stable, likely as outpt per Dr. Patsey Berthold - mgmt of effusion per cardiology who is following  # A fib w/ rvr # Acute hypoxic respiratory failure Difficulty controlling hr with dilt, amio added and has been re-bolused. HR currently controlled. CXR yesterday w/ pulmonary edema. Procalcitonin remains low, no new consolidation, do not think infection - cont amio, dilt, per cardiology - incr lasix to 60 iv qd - cont o2, will likely discharge with this  # Acute cystitis? # sepsis ruled out Initially tachycardic, leukocytosis, elevated lactate. Urine suggestive of infection, culture negative. Abx discontinued  # Graves Disease TSH wnl, T4 mildly elevated - outpt endo f/u  # Hyperglycemia Mild. A1c 6.2   DVT prophylaxis: lovenox Code Status: full Family Communication: husband updated @ bedside 7/23  Level of care: Stepdown Status is: Inpatient  Remains inpatient appropriate because:Inpatient  level of care appropriate due to severity of illness  Dispo: The patient is from: Home              Anticipated d/c is to: Home, PT eval pending              Patient currently is not medically stable to d/c.   Difficult to place patient No        Consultants:  Cardiology, oncology  Procedures: Pericardial drain placement, now removed  Antimicrobials:  C/p cefepime/ceftriaxone   Subjective: This morning some cough, non-productive. No abd pain. No chest pain.   Objective: Vitals:   03/31/21 0601 03/31/21 0602 03/31/21 0603 03/31/21 0605  BP:      Pulse: (!) 143 96 97 96  Resp: (!) 22 (!) 21 (!) 24 (!) 24  Temp:      TempSrc:      SpO2: 90% 92% 91% 92%  Weight:      Height:        Intake/Output Summary (Last 24 hours) at 03/31/2021 0923 Last data filed at 03/31/2021 0515 Gross per 24 hour  Intake 512.25 ml  Output 650 ml  Net -137.75 ml   Filed Weights   03/28/21 0500 03/29/21 0500 03/31/21 0453  Weight: 58.4 kg 61.9 kg 62.4 kg    Examination:  General exam: chronically ill appearing Respiratory system: decreased breath sounds right upper, rales at bases Cardiovascular system: S1 & S2 heard, soft systolic murmur Gastrointestinal system: Abdomen is nondistended, soft and nontender. No organomegaly or masses felt. Normal bowel sounds heard. Central nervous system: Alert and oriented. No focal neurological deficits. Extremities: Symmetric 5 x 5 power. Skin: No rashes, lesions  or ulcers Psychiatry: Judgement and insight appear normal. Mood & affect appropriate.     Data Reviewed: I have personally reviewed following labs and imaging studies  CBC: Recent Labs  Lab 03/27/21 1502 03/28/21 0131 03/29/21 0413  WBC 21.1*  21.7* 20.3* 16.7*  NEUTROABS 15.1*  --   --   HGB 15.6*  15.7* 13.8 14.7  HCT 46.6*  45.4 40.5 43.7  MCV 88.4  88.0 87.7 86.5  PLT 490*  467* 377 299*   Basic Metabolic Panel: Recent Labs  Lab 03/27/21 1502 03/28/21 0131  03/29/21 0413 03/30/21 0452 03/31/21 0444  NA 131* 135 139 132* 131*  K 4.5 4.1 3.6 3.7 3.8  CL 98 104 106 99 95*  CO2 20* 23 25 25 28   GLUCOSE 144* 120* 113* 166* 152*  BUN 34* 22* 10 9 10   CREATININE 0.81 0.58 0.40* 0.43* 0.48  CALCIUM 9.4 8.6* 8.7* 8.4* 8.5*  MG  --  2.2  --   --   --   PHOS  --  3.3  --   --   --    GFR: Estimated Creatinine Clearance: 66.6 mL/min (by C-G formula based on SCr of 0.48 mg/dL). Liver Function Tests: Recent Labs  Lab 03/27/21 1635  AST 31  ALT 21  ALKPHOS 74  BILITOT 1.2  PROT 7.7  ALBUMIN 3.2*   No results for input(s): LIPASE, AMYLASE in the last 168 hours. No results for input(s): AMMONIA in the last 168 hours. Coagulation Profile: Recent Labs  Lab 03/27/21 1701  INR 1.1   Cardiac Enzymes: Recent Labs  Lab 03/27/21 1635  CKTOTAL 15*   BNP (last 3 results) No results for input(s): PROBNP in the last 8760 hours. HbA1C: Recent Labs    03/29/21 0948  HGBA1C 6.2*   CBG: Recent Labs  Lab 03/27/21 2138  GLUCAP 101*   Lipid Profile: No results for input(s): CHOL, HDL, LDLCALC, TRIG, CHOLHDL, LDLDIRECT in the last 72 hours. Thyroid Function Tests: No results for input(s): TSH, T4TOTAL, FREET4, T3FREE, THYROIDAB in the last 72 hours.  Anemia Panel: No results for input(s): VITAMINB12, FOLATE, FERRITIN, TIBC, IRON, RETICCTPCT in the last 72 hours. Urine analysis:    Component Value Date/Time   COLORURINE AMBER (A) 03/27/2021 1701   APPEARANCEUR CLOUDY (A) 03/27/2021 1701   APPEARANCEUR Clear 12/01/2013 1439   LABSPEC 1.028 03/27/2021 1701   LABSPEC 1.005 12/01/2013 1439   PHURINE 5.0 03/27/2021 1701   GLUCOSEU 50 (A) 03/27/2021 1701   GLUCOSEU Negative 12/01/2013 1439   HGBUR NEGATIVE 03/27/2021 1701   BILIRUBINUR SMALL (A) 03/27/2021 1701   BILIRUBINUR Negative 12/01/2013 1439   KETONESUR NEGATIVE 03/27/2021 1701   PROTEINUR 30 (A) 03/27/2021 1701   NITRITE NEGATIVE 03/27/2021 1701   LEUKOCYTESUR MODERATE (A)  03/27/2021 1701   LEUKOCYTESUR 1+ 12/01/2013 1439   Sepsis Labs: @LABRCNTIP (procalcitonin:4,lacticidven:4)  ) Recent Results (from the past 240 hour(s))  Blood culture (routine single)     Status: None (Preliminary result)   Collection Time: 03/27/21  5:01 PM   Specimen: BLOOD  Result Value Ref Range Status   Specimen Description BLOOD RIGHT ANTECUBITAL  Final   Special Requests   Final    BOTTLES DRAWN AEROBIC AND ANAEROBIC Blood Culture adequate volume   Culture   Final    NO GROWTH 4 DAYS Performed at [facility], [address] [place], [place] [address]    Report Status PENDING  Incomplete  Resp Panel by RT-PCR (Flu A&B, Covid) Nasopharyngeal Swab  Status: None   Collection Time: 03/27/21  5:01 PM   Specimen: Nasopharyngeal Swab; Nasopharyngeal(NP) swabs in vial transport medium  Result Value Ref Range Status   SARS Coronavirus 2 by RT PCR NEGATIVE NEGATIVE Final    Comment: (NOTE) SARS-CoV-2 target nucleic acids are NOT DETECTED.  The SARS-CoV-2 RNA is generally detectable in upper respiratory specimens during the acute phase of infection. The lowest concentration of SARS-CoV-2 viral copies this assay can detect is 138 copies/mL. A negative result does not preclude SARS-Cov-2 infection and should not be used as the sole basis for treatment or other patient management decisions. A negative result may occur with  improper specimen collection/handling, submission of specimen other than nasopharyngeal swab, presence of viral mutation(s) within the areas targeted by this assay, and inadequate number of viral copies(<138 copies/mL). A negative result must be combined with clinical observations, patient history, and epidemiological information. The expected result is Negative.  Fact Sheet for Patients:  EntrepreneurPulse.com.au  Fact Sheet for Healthcare Providers:  IncredibleEmployment.be  This test is no t yet approved  or cleared by the [place] FDA and  has been authorized for detection and/or diagnosis of SARS-CoV-2 by FDA under an Emergency Use Authorization (EUA). This EUA will remain  in effect (meaning this test can be used) for the duration of the COVID-19 declaration under Section 564(b)(1) of the Act, 21 U.S.C.section 360bbb-3(b)(1), unless the authorization is terminated  or revoked sooner.       Influenza A by PCR NEGATIVE NEGATIVE Final   Influenza B by PCR NEGATIVE NEGATIVE Final    Comment: (NOTE) The Xpert Xpress SARS-CoV-2/FLU/RSV plus assay is intended as an aid in the diagnosis of influenza from Nasopharyngeal swab specimens and should not be used as a sole basis for treatment. Nasal washings and aspirates are unacceptable for Xpert Xpress SARS-CoV-2/FLU/RSV testing.  Fact Sheet for Patients: EntrepreneurPulse.com.au  Fact Sheet for Healthcare Providers: IncredibleEmployment.be  This test is not yet approved or cleared by the [place] FDA and has been authorized for detection and/or diagnosis of SARS-CoV-2 by FDA under an Emergency Use Authorization (EUA). This EUA will remain in effect (meaning this test can be used) for the duration of the COVID-19 declaration under Section 564(b)(1) of the Act, 21 U.S.C. section 360bbb-3(b)(1), unless the authorization is terminated or revoked.  Performed at [facility], [address] [place], [place] [address]   Urine Culture     Status: Abnormal   Collection Time: 03/27/21  5:01 PM   Specimen: In/Out Cath Urine  Result Value Ref Range Status   Specimen Description   Final    IN/OUT CATH URINE Performed at [facility], [address], [place], [place] [address]    Special Requests   Final    NONE Performed at [facility], [address] [place], [place] [address]    Culture (A)  Final    <10,000 COLONIES/mL INSIGNIFICANT GROWTH Performed at [facility] [address], [place], [place] [address]    Report Status 03/29/2021 FINAL  Final  Blood culture (single)     Status: None (Preliminary result)   Collection Time: 03/27/21  6:05 PM   Specimen: BLOOD  Result Value Ref Range Status   Specimen Description BLOOD LEFT ANTECUBITAL  Final   Special Requests   Final    BOTTLES DRAWN AEROBIC AND ANAEROBIC Blood Culture results may not be optimal due to an inadequate volume of blood received in culture bottles  Culture   Final    NO GROWTH 4 DAYS Performed at [facility], [address] [place], [place] [address]    Report Status PENDING  Incomplete  Body fluid culture w Gram Stain     Status: None (Preliminary result)   Collection Time: 03/27/21  8:26 PM   Specimen: Pericardial; Body Fluid  Result Value Ref Range Status   Specimen Description   Final    PERICARDIAL Performed at [facility], [address] [place], [place] [address]    Special Requests   Final    NONE Performed at [facility], [address] [place], [place] [address]    Gram Stain   Final    RARE WBC PRESENT,BOTH PMN AND MONONUCLEAR NO ORGANISMS SEEN    Culture   Final    NO GROWTH 2 DAYS Performed at [facility], [place] [address] [place], [place] [address]    Report Status PENDING  Incomplete  Acid Fast Smear (AFB)     Status: None   Collection Time: 03/27/21  8:26 PM   Specimen: Pericardial Fluid  Result Value Ref Range Status   AFB Specimen Processing Concentration  Final   Acid Fast Smear Negative  Final    Comment: (NOTE) Performed At: [facility] [place], [place] [id number] Rush Farmer MD MV:[id number]    Source (AFB) FLUID  Final    Comment: Performed at [facility], [address] [place], [place] [address]  Culture, blood (routine x 2)     Status: None (Preliminary result)   Collection Time: 03/27/21 10:18 PM   Specimen: BLOOD RIGHT FOREARM  Result  Value Ref Range Status   Specimen Description BLOOD RIGHT FOREARM  Final   Special Requests   Final    BOTTLES DRAWN AEROBIC AND ANAEROBIC Blood Culture adequate volume   Culture   Final    NO GROWTH 4 DAYS Performed at [facility], [address], [place], [place] [address]    Report Status PENDING  Incomplete  MRSA Next Gen by PCR, Nasal     Status: None   Collection Time: 03/27/21 11:01 PM   Specimen: Nasal Mucosa; Nasal Swab  Result Value Ref Range Status   MRSA by PCR Next Gen NOT DETECTED NOT DETECTED Final    Comment: (NOTE) The GeneXpert MRSA Assay (FDA approved for NASAL specimens only), is one component of a comprehensive MRSA colonization surveillance program. It is not intended to diagnose MRSA infection nor to guide or monitor treatment for MRSA infections. Test performance is not FDA approved in patients less than 28 years old. Performed at [facility], [address] [place], [place] [address]          Radiology Studies: DG Chest 1 View  Result Date: 03/30/2021 CLINICAL DATA:  Possible pneumothorax EXAM: CHEST  1 VIEW COMPARISON:  03/29/2021 FINDINGS: Spiculated right upper lobe lung mass with relative hyperlucency in the vicinity but without change, prior chest CT did not demonstrate corresponding abnormality other than emphysema. No discrete pleural line is seen. Small left pleural effusion. Mild cardiomegaly with increasing vascular congestion and pulmonary edema. Subsegmental atelectasis at the left lung base. Drainage catheter appears removed. IMPRESSION: 1. Mild cardiomegaly with increasing vascular congestion/mild pulmonary edema and small left effusion 2. Spiculated right upper lobe lung mass. Asymmetrical hyperlucency surrounding the mass without change and no definitive pleural line to suggest pneumothorax. Electronically Signed   By: Madie [place].D.  On: 03/30/2021 01:11   ECHOCARDIOGRAM LIMITED  Result Date: 03/29/2021     ECHOCARDIOGRAM LIMITED REPORT   Patient Name:   NELA BASCOM Date of Exam: 03/29/2021 Medical Rec #:  [id number]   Height:       61.5 in Accession #:    [id number]  Weight:       136.5 lb Date of Birth:  June 11, 1961  BSA:          1.615 m Patient Age:    60 years    BP:           137/91 mmHg Patient Gender: F           HR:           116 bpm. Exam Location:  [facility] Procedure: Cardiac Doppler, Color Doppler and Limited Echo Indications:     Pericardial Effusion I31.3  History:         Patient has prior history of Echocardiogram examinations, most                  recent 03/28/2021. Risk Factors:Hypertension. Cardiac tamponade.  Sonographer:     Sherrie Sport RDCS (AE) Referring Phys:  [id number] [place] Diagnosing Phys: Ida Rogue MD  Sonographer Comments: Technically difficult study due to poor echo windows. IMPRESSIONS  1. Trvial pericardial effusion.  2. Left ventricular ejection fraction, by estimation, is 60 to 65%. The left ventricle has normal function. The left ventricle has no regional wall motion abnormalities.  3. Right ventricular systolic function is normal. The right ventricular size is normal.  4. The mitral valve is normal in structure. No evidence of mitral valve regurgitation. No evidence of mitral stenosis. FINDINGS  Left Ventricle: Left ventricular ejection fraction, by estimation, is 60 to 65%. The left ventricle has normal function. The left ventricle has no regional wall motion abnormalities. The left ventricular internal cavity size was normal in size. There is  no left ventricular hypertrophy. Right Ventricle: The right ventricular size is normal. No increase in right ventricular wall thickness. Right ventricular systolic function is normal. Left Atrium: Left atrial size was normal in size. Right Atrium: Right atrial size was normal in size. Pericardium: Trivial pericardial effusion is present. Mitral Valve: The mitral valve is normal in structure. No evidence of mitral valve stenosis.  Tricuspid Valve: The tricuspid valve is normal in structure. Tricuspid valve regurgitation is not demonstrated. No evidence of tricuspid stenosis. Aortic Valve: The aortic valve was not well visualized. Aortic valve regurgitation is not visualized. No aortic stenosis is present. Pulmonic Valve: The pulmonic valve was normal in structure. Pulmonic valve regurgitation is not visualized. No evidence of pulmonic stenosis. Aorta: The aortic root is normal in size and structure. Venous: The inferior vena cava is normal in size with greater than 50% respiratory variability, suggesting right atrial pressure of 3 mmHg. IAS/Shunts: No atrial level shunt detected by color flow Doppler. LEFT VENTRICLE PLAX 2D LVIDd:         4.04 cm LVIDs:         2.55 cm LV PW:         1.31 cm LV IVS:        0.93 cm LVOT diam:     2.00 cm LVOT Area:     3.14 cm  LEFT ATRIUM           Index      RIGHT ATRIUM           Index LA diam:  3.10 cm 1.92 cm/m RA Area:     10.20 cm LA Vol (A4C): 11.2 ml 6.93 ml/m RA Volume:   23.00 ml  14.24 ml/m   AORTA Ao Root diam: 2.80 cm TRICUSPID VALVE TR Peak grad:   4.8 mmHg TR Vmax:        110.00 cm/s  SHUNTS Systemic Diam: 2.00 cm Ida Rogue MD Electronically signed by Ida Rogue MD Signature Date/Time: 03/29/2021/3:32:05 PM    Final         Scheduled Meds:  Chlorhexidine Gluconate Cloth  6 each Topical Q0600   enoxaparin (LOVENOX) injection  40 mg Subcutaneous Q24H   feeding supplement  237 mL Oral TID BM   ipratropium  0.5 mg Nebulization TID   ketorolac  30 mg Intravenous Once   levalbuterol  0.63 mg Nebulization TID   mouth rinse  15 mL Mouth Rinse BID   multivitamin with minerals  1 tablet Oral Daily   nicotine  21 mg Transdermal Daily   nystatin  5 mL Oral QID   ondansetron (ZOFRAN) IV  4 mg Intravenous Once   Continuous Infusions:  amiodarone 30 mg/hr (03/31/21 0515)   amiodarone     diltiazem (CARDIZEM) infusion 3.5 mg/hr (03/31/21 0515)     LOS: 4 days     Time spent: 12 min    Desma Maxim, MD [facility]   If 7PM-7AM, please contact night-coverage [email or web address] Password [facility] 03/31/2021, 9:23 AM

## 2021-03-31 NOTE — Progress Notes (Signed)
Pt having runs of Afib RVR with rates between 120's-160's during episodes. On call floor provider notified. Cardizem added and told not to titrate past 5. Pt still having breakthrough episodes on both amio and cardizem. Maps sustaining above 65. Notified on call provider of additional episodes.

## 2021-03-31 NOTE — Progress Notes (Signed)
Progress Note  Patient Name: Nichole Park Date of Encounter: 03/31/2021  [facility] Cardiologist: None   Subjective   Patient resting  Tired  Inpatient Medications    Scheduled Meds:  Chlorhexidine Gluconate Cloth  6 each Topical Q0600   enoxaparin (LOVENOX) injection  40 mg Subcutaneous Q24H   feeding supplement  237 mL Oral TID BM   furosemide  60 mg Intravenous Daily   ipratropium  0.5 mg Nebulization TID   ketorolac  30 mg Intravenous Once   levalbuterol  0.63 mg Nebulization TID   mouth rinse  15 mL Mouth Rinse BID   multivitamin with minerals  1 tablet Oral Daily   nicotine  21 mg Transdermal Daily   nystatin  5 mL Oral QID   ondansetron (ZOFRAN) IV  4 mg Intravenous Once   Continuous Infusions:  amiodarone 60 mg/hr (03/31/21 1030)   diltiazem (CARDIZEM) infusion 3.5 mg/hr (03/31/21 0515)   PRN Meds: acetaminophen, chlorpheniramine-HYDROcodone, docusate sodium, guaiFENesin, guaiFENesin-dextromethorphan, ketorolac, LORazepam, polyethylene glycol   Vital Signs    Vitals:   03/31/21 0800 03/31/21 0900 03/31/21 1000 03/31/21 1100  BP: 96/80 107/79 109/79 103/84  Pulse: 98 95 89 (!) 110  Resp: (!) 21 (!) 23 (!) 23 (!) 30  Temp: 98.7 F (37.1 C)     TempSrc: Axillary     SpO2: 94% 95% 96% 92%  Weight:      Height:        Intake/Output Summary (Last 24 hours) at 03/31/2021 1125 Last data filed at 03/31/2021 0515 Gross per 24 hour  Intake 512.25 ml  Output 650 ml  Net -137.75 ml   Last 3 Weights 03/31/2021 03/29/2021 03/28/2021  Weight (lbs) 137 lb 9.1 oz 136 lb 7.4 oz 128 lb 12 oz  Weight (kg) 62.4 kg 61.9 kg 58.4 kg      Telemetry    SR and atrial fibrllation with RVR    ECG    - Personally Reviewed  Physical Exam   GEN: No acute distress.   Neck: No JVD Cardiac: RRR, no murmurs Respiratory:Rhonchi and wheezes   Rales at R base   GI: Soft, nontender, non-distended  MS: No edema; No deformity. Neuro:  Nonfocal  Psych: Normal affect   Labs     High Sensitivity Troponin:  No results for input(s): TROPONINIHS in the last 720 hours.    Chemistry Recent Labs  Lab 03/27/21 1635 03/28/21 0131 03/29/21 0413 03/30/21 0452 03/31/21 0444  NA  --    < > 139 132* 131*  K  --    < > 3.6 3.7 3.8  CL  --    < > 106 99 95*  CO2  --    < > 25 25 28   GLUCOSE  --    < > 113* 166* 152*  BUN  --    < > 10 9 10   CREATININE  --    < > 0.40* 0.43* 0.48  CALCIUM  --    < > 8.7* 8.4* 8.5*  PROT 7.7  --   --   --   --   ALBUMIN 3.2*  --   --   --   --   AST 31  --   --   --   --   ALT 21  --   --   --   --   ALKPHOS 74  --   --   --   --   BILITOT 1.2  --   --   --   --  GFRNONAA  --    < > >60 >60 >60  ANIONGAP  --    < > 8 8 8    < > = values in this interval not displayed.     Hematology Recent Labs  Lab 03/27/21 1502 03/28/21 0131 03/29/21 0413  WBC 21.1*  21.7* 20.3* 16.7*  RBC 5.27*  5.16* 4.62 5.05  HGB 15.6*  15.7* 13.8 14.7  HCT 46.6*  45.4 40.5 43.7  MCV 88.4  88.0 87.7 86.5  MCH 29.6  30.4 29.9 29.1  MCHC 33.5  34.6 34.1 33.6  RDW 12.8  12.7 12.6 12.6  PLT 490*  467* 377 423*    BNPNo results for input(s): BNP, PROBNP in the last 168 hours.   DDimer No results for input(s): DDIMER in the last 168 hours.   Radiology    DG Chest 1 View  Result Date: 03/30/2021 CLINICAL DATA:  Possible pneumothorax EXAM: CHEST  1 VIEW COMPARISON:  03/29/2021 FINDINGS: Spiculated right upper lobe lung mass with relative hyperlucency in the vicinity but without change, prior chest CT did not demonstrate corresponding abnormality other than emphysema. No discrete pleural line is seen. Small left pleural effusion. Mild cardiomegaly with increasing vascular congestion and pulmonary edema. Subsegmental atelectasis at the left lung base. Drainage catheter appears removed. IMPRESSION: 1. Mild cardiomegaly with increasing vascular congestion/mild pulmonary edema and small left effusion 2. Spiculated right upper lobe lung mass.  Asymmetrical hyperlucency surrounding the mass without change and no definitive pleural line to suggest pneumothorax. Electronically Signed   By: Donavan Foil M.D.   On: 03/30/2021 01:11   ECHOCARDIOGRAM LIMITED  Result Date: 03/29/2021    ECHOCARDIOGRAM LIMITED REPORT   Patient Name:   Nichole Park Date of Exam: 03/29/2021 Medical Rec #:  [id number]   Height:       61.5 in Accession #:    [id number]  Weight:       136.5 lb Date of Birth:  1960/11/10  BSA:          1.615 m Patient Age:    60 years    BP:           137/91 mmHg Patient Gender: F           HR:           116 bpm. Exam Location:  [facility] Procedure: Cardiac Doppler, Color Doppler and Limited Echo Indications:     Pericardial Effusion I31.3  History:         Patient has prior history of Echocardiogram examinations, most                  recent 03/28/2021. Risk Factors:Hypertension. Cardiac tamponade.  Sonographer:     Sherrie Sport RDCS (AE) Referring Phys:  [id number] [place] Diagnosing Phys: Ida Rogue MD  Sonographer Comments: Technically difficult study due to poor echo windows. IMPRESSIONS  1. Trvial pericardial effusion.  2. Left ventricular ejection fraction, by estimation, is 60 to 65%. The left ventricle has normal function. The left ventricle has no regional wall motion abnormalities.  3. Right ventricular systolic function is normal. The right ventricular size is normal.  4. The mitral valve is normal in structure. No evidence of mitral valve regurgitation. No evidence of mitral stenosis. FINDINGS  Left Ventricle: Left ventricular ejection fraction, by estimation, is 60 to 65%. The left ventricle has normal function. The left ventricle has no regional wall motion abnormalities. The left ventricular internal cavity size was normal in size.  There is  no left ventricular hypertrophy. Right Ventricle: The right ventricular size is normal. No increase in right ventricular wall thickness. Right ventricular systolic function is normal. Left  Atrium: Left atrial size was normal in size. Right Atrium: Right atrial size was normal in size. Pericardium: Trivial pericardial effusion is present. Mitral Valve: The mitral valve is normal in structure. No evidence of mitral valve stenosis. Tricuspid Valve: The tricuspid valve is normal in structure. Tricuspid valve regurgitation is not demonstrated. No evidence of tricuspid stenosis. Aortic Valve: The aortic valve was not well visualized. Aortic valve regurgitation is not visualized. No aortic stenosis is present. Pulmonic Valve: The pulmonic valve was normal in structure. Pulmonic valve regurgitation is not visualized. No evidence of pulmonic stenosis. Aorta: The aortic root is normal in size and structure. Venous: The inferior vena cava is normal in size with greater than 50% respiratory variability, suggesting right atrial pressure of 3 mmHg. IAS/Shunts: No atrial level shunt detected by color flow Doppler. LEFT VENTRICLE PLAX 2D LVIDd:         4.04 cm LVIDs:         2.55 cm LV PW:         1.31 cm LV IVS:        0.93 cm LVOT diam:     2.00 cm LVOT Area:     3.14 cm  LEFT ATRIUM           Index      RIGHT ATRIUM           Index LA diam:      3.10 cm 1.92 cm/m RA Area:     10.20 cm LA Vol (A4C): 11.2 ml 6.93 ml/m RA Volume:   23.00 ml  14.24 ml/m   AORTA Ao Root diam: 2.80 cm TRICUSPID VALVE TR Peak grad:   4.8 mmHg TR Vmax:        110.00 cm/s  SHUNTS Systemic Diam: 2.00 cm Ida Rogue MD Electronically signed by Ida Rogue MD Signature Date/Time: 03/29/2021/3:32:05 PM    Final     Cardiac Studies  Echocardiogram (7/21)_  1. Trvial pericardial effusion.   2. Left ventricular ejection fraction, by estimation, is 60 to 65%. The  left ventricle has normal function. The left ventricle has no regional  wall motion abnormalities.   3. Right ventricular systolic function is normal. The right ventricular  size is normal.   4. The mitral valve is normal in structure. No evidence of mitral valve   regurgitation. No evidence of mitral stenosis.    Patient Profile     60 y.o. female with history of Graves' disease, smoker x40+ years presenting with shortness of breath, found to have a large pericardial effusion, right upper lobe lung mass suspicious for malignancy.  Assessment & Plan    Pericardial effusion  Pt s/p pericardiocentesis      Drain removed 2 days ago  yesterday's echo with no reaccumulation  Cytology is positive for malignancy    Will defer to primary service to review with patient      But, will need to follow closely given high chance for reaccumulation .    Based on other recomm consider for window if does   Will continue to follow  She is on qd lovenox.      2. Atrial fibrillation  Pt in and out of atrial fibrillation  Currently in SR  Her rates were high earlier      I gave a bolus and  increased rate of amio to 60 mg /hour    Dilt was added back as a gtt at 5mg  /hour    I would continue this for now   Keep at higher rate for amio to hasten load   Risk for afib will be there given Problem 1   3 Emphysema, lung mass -Management as per critical care team RUL lung malignancy with mediastinal metastasis.   3.  Smoking -Cessation has been advised     For questions or updates, please contact [place] Please consult [email or web address] for contact info under        Signed, Dorris Carnes, MD  03/31/2021, 11:25 AM

## 2021-03-31 NOTE — Progress Notes (Signed)
PT Cancellation Note  Patient Details Name: Nichole Park MRN: [id number] DOB: Jan 30, 1961   Cancelled Treatment:    Reason Eval/Treat Not Completed: Patient not medically ready PT orders received, chart reviewed. Per chart pt still having runs of Afib RVR & new to cardizem drip. PT protocol is to hold PT intervention for 24 hrs following initiation of drip. Will f/u as able & as pt is medically stable.  Lavone Nian, PT, DPT 03/31/21, 8:10 AM    [name] 03/31/2021, 8:06 AM

## 2021-04-01 ENCOUNTER — Inpatient Hospital Stay: Payer: Medicaid Other

## 2021-04-01 LAB — BASIC METABOLIC PANEL
Anion gap: 9 (ref 5–15)
BUN: 7 mg/dL (ref 6–20)
CO2: 31 mmol/L (ref 22–32)
Calcium: 8.9 mg/dL (ref 8.9–10.3)
Chloride: 90 mmol/L — ABNORMAL LOW (ref 98–111)
Creatinine, Ser: 0.46 mg/dL (ref 0.44–1.00)
GFR, Estimated: >60 mL/min (ref >60)
Glucose, Bld: 145 mg/dL — ABNORMAL HIGH (ref 70–99)
Potassium: 3.3 mmol/L — ABNORMAL LOW (ref 3.5–5.1)
Sodium: 130 mmol/L — ABNORMAL LOW (ref 135–145)

## 2021-04-01 LAB — BLOOD GAS, ARTERIAL
Acid-Base Excess: 13.4 mmol/L — ABNORMAL HIGH (ref 0.0–2.0)
Bicarbonate: 37.6 mmol/L — ABNORMAL HIGH (ref 20.0–28.0)
FIO2: 0.55
O2 Saturation: 96.8 %
Patient temperature: 37
pCO2 arterial: 44 mmHg (ref 32.0–48.0)
pH, Arterial: 7.54 — ABNORMAL HIGH (ref 7.350–7.450)
pO2, Arterial: 78 mmHg — ABNORMAL LOW (ref 83.0–108.0)

## 2021-04-01 LAB — CBC
HCT: 39.5 % (ref 36.0–46.0)
Hemoglobin: 13.5 g/dL (ref 12.0–15.0)
MCH: 29.9 pg (ref 26.0–34.0)
MCHC: 34.2 g/dL (ref 30.0–36.0)
MCV: 87.4 fL (ref 80.0–100.0)
Platelets: 350 10*3/uL (ref 150–400)
RBC: 4.52 MIL/uL (ref 3.87–5.11)
RDW: 12.5 % (ref 11.5–15.5)
WBC: 17.7 10*3/uL — ABNORMAL HIGH (ref 4.0–10.5)
nRBC: 0 % (ref 0.0–0.2)

## 2021-04-01 MED ORDER — MORPHINE SULFATE (PF) 2 MG/ML IV SOLN
INTRAVENOUS | Status: AC
  Administered 2021-04-01: 2 mg via INTRAVENOUS
  Filled 2021-04-01: qty 1

## 2021-04-01 MED ORDER — MORPHINE SULFATE (PF) 2 MG/ML IV SOLN: 2.0000 mg | Freq: Once | INTRAVENOUS | Status: AC

## 2021-04-01 MED ORDER — MORPHINE SULFATE (PF) 2 MG/ML IV SOLN: 2.0000 mg | INTRAVENOUS | Status: DC | PRN

## 2021-04-01 MED ORDER — PROPYLTHIOURACIL 50 MG PO TABS
100.0000 mg | ORAL_TABLET | Freq: Three times a day (TID) | ORAL | Status: DC
  Administered 2021-04-01 – 2021-04-09 (×25): 100 mg via ORAL
  Filled 2021-04-01 (×29): qty 2

## 2021-04-01 MED ORDER — MORPHINE SULFATE (PF) 2 MG/ML IV SOLN
INTRAVENOUS | Status: AC
  Filled 2021-04-01: qty 1

## 2021-04-01 MED ORDER — POTASSIUM CHLORIDE CRYS ER 20 MEQ PO TBCR
40.0000 meq | EXTENDED_RELEASE_TABLET | Freq: Every day | ORAL | Status: DC
  Administered 2021-04-01 – 2021-04-05 (×5): 40 meq via ORAL
  Filled 2021-04-01 (×5): qty 2

## 2021-04-01 MED ORDER — FUROSEMIDE 10 MG/ML IJ SOLN
80.0000 mg | Freq: Once | INTRAMUSCULAR | Status: AC
  Administered 2021-04-01: 80 mg via INTRAVENOUS
  Filled 2021-04-01: qty 8

## 2021-04-01 NOTE — Progress Notes (Signed)
PT Cancellation Note  Patient Details Name: SOSHA SHEPHERD MRN: [id number] DOB: 1961-04-05   Cancelled Treatment:    Reason Eval/Treat Not Completed: Patient not medically ready PT orders received, chart reviewed. Spoke with nurse who reports pt is very anxious & SOB & has periods where HR will increase to 170s due to anxiety when lying in bed & deferred PT at this time. Nurse also reports pt recently received IV ativan. Per therapy protocol, after 3 unsuccessful PT attempts, will complete current orders & await new orders should pt become more appropriate for PT intervention.  Lavone Nian, PT, DPT 04/01/21, 9:29 AM    [name] 04/01/2021, 9:28 AM

## 2021-04-01 NOTE — TOC Initial Note (Signed)
Transition of Care [facility]) - Initial/Assessment Note    Patient Details  Name: Nichole Park MRN: [id number] Date of Birth: Dec 11, 1960  Transition [facility] CM/SW Contact:    Ova Freshwater Phone Number: [phone] 04/01/2021, 4:10 PM  Clinical Narrative:                  Patient presents to [facility] due to SOB for three weeks.  Patients currently in the ICU and was found to have large right upper lobar mass and pericardial effusion w/ tamponade physiology.  Patient's Park,Nichole (Daughter) [phone] is the main contact.  Patient is having difficulty communicating due to difficulty breathing.  CSW spoke with patient and [name].   CSW discussed SNF placement, Home Health services.  CSW discussed LOG SNF process and charity home health.  [name] stated she thinks the patient would be better off at a SNF placement for rehab but thinks the patient may not want o go and therefore she may need to move to Ms. Park's house and receive home health services there.  [name] stated she would like assistance with Medicaid/Medicare and Disability application.  CSW stated I would refer the patient to [place] which will assist with the application process.  CSW stated I would give [facility] Ms. Park's contact information.  [name] asked CSW about POAs and hospice care.  CSW went briefly over what the POAs cover and how she would need to have the patient sign the paperwork and have it notarized to take effect. CSW also informed [name] about the different services hospice care may offer and recommended she contact local hospice facilities or ask the RN for assistance with hospice information. [name] verbalized understanding.  CSW sent Medicaid application request referral to [facility].  Expected Discharge Plan: [place] Barriers to Discharge: Continued Medical Work up   Patient Goals and CMS Choice Patient states their goals for this  hospitalization and ongoing recovery are:: Patient would like to go home as soon as possible CMS Medicare.gov Compare Post Acute Care list provided to:: Patient Represenative (must comment) Nichole Park (Daughter)   [phone] (Mobile)) Choice offered to / list presented to : Adult Children Nichole Park (Daughter)   [phone] (Mobile))  Expected Discharge Plan and Services Expected Discharge Plan: [place]       Living arrangements for the past 2 months: Single Family Home                                      Prior Living Arrangements/Services Living arrangements for the past 2 months: Single Family Home Lives with:: Domestic Partner Patient language and need for interpreter reviewed:: Yes Do you feel safe going back to the place where you live?: Yes      Need for Family Participation in Patient Care: Yes (Comment) Care giver support system in place?: Yes (comment)   Criminal Activity/Legal Involvement Pertinent to Current Situation/Hospitalization: No - Comment as needed  Activities of Daily Living Home Assistive Devices/Equipment: Eyeglasses (reading glasses) ADL Screening (condition at time of admission) Patient's cognitive ability adequate to safely complete daily activities?: No Is the patient deaf or have difficulty hearing?: No Does the patient have difficulty seeing, even when wearing glasses/contacts?: No Does the patient have difficulty concentrating, remembering, or making decisions?: No Patient able to express need for assistance with ADLs?: No Does  the patient have difficulty dressing or bathing?: No Independently performs ADLs?: Yes (appropriate for developmental age) Does the patient have difficulty walking or climbing stairs?: No Weakness of Legs: None Weakness of Arms/Hands: None  Permission Sought/Granted Permission sought to share information with : Family Supports Permission granted to share information with : Yes, Verbal  Permission Granted  Share Information with NAME: Nichole Park (Daughter)   [phone] (Mobile)           Emotional Assessment Appearance:: Appears stated age Attitude/Demeanor/Rapport: Apprehensive Affect (typically observed): Anxious Orientation: : Oriented to Self, Oriented to Place, Oriented to  Time, Oriented to Situation Alcohol / Substance Use: Tobacco Use Psych Involvement: No (comment)  Admission diagnosis:  Shortness of breath [R06.02] Lung mass [R91.8] Weakness [R53.1] Tachycardia [R00.0] Elevated lactic acid level [R79.89] Urinary tract infection with hematuria, site unspecified [N39.0, R31.9] Cardiac tamponade [I31.4] Cardiac/pericardial tamponade [I31.4] Patient Active Problem List   Diagnosis Date Noted   PAF (paroxysmal atrial fibrillation) ([place]) 03/30/2021   COPD (chronic obstructive pulmonary disease) ([place]) 03/30/2021   Hyperthyroidism 03/29/2021   Pericardial effusion    Tobacco use    Cardiac tamponade 03/27/2021   Cardiac/pericardial tamponade 03/27/2021   Hypertension 03/22/2014   PCP:  Pcp, No Pharmacy:  No Pharmacies Listed    Social Determinants of Health (SDOH) Interventions    Readmission Risk Interventions No flowsheet data found.

## 2021-04-01 NOTE — Consult Note (Signed)
NAME:  Nichole Park, MRN:  [id number], DOB:  03-Dec-1960, LOS: 5 ADMISSION DATE:  03/27/2021, CONSULTATION DATE:  03/27/2021 REFERRING MD:  Dr. Saunders Revel, CHIEF COMPLAINT:  SOB   Brief Pt Description / Synopsis:  60 y.o. 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   History of Present Illness:  A 60 year old  female who presented to the [facility] ED from [facility] walk in Clinic with sharp epigastric pain, progressive SOB onset 3 weeks ago associated with generalized weakness. Patient reported generalized weakness and mild left-sided chest discomfort that worsened with inspiration and was not associated with exertion. She reports intermittent systemic symptoms of fevers, and weight loss (8-10lbs over a month) , productive cough, and dark urine. She has history of cigarette smoking 1 pack /day since the age of 60 years old. When the symptoms worsened yesterday, she went to walk in clinic who sent him to the emergency department for further evaluation of these symptoms.   ED Course: On arrival to the ED, she was afebrile with blood pressure 102/80 mm Hg and pulse rate 119 beats/min, RR 25 breaths per minute, oxygen saturation was over 95% on room air.T Laboratory evaluation was significant for  WBC/Hgb/Hct/Plts:  21.7, 21.1/15.7, 15.6/45.4, 46.6/467, 490 (07/19 1502). Na+ 131, CO2 20, glucose 144, BUN 34, otherwise unremarkable CMP. Other Labs: Baseline procalcitonin <0.10, TSH 8.150, Lactate: 5.3>3.9, hcg 64. UA: Positive for UTI, Pregnancy, Urine POSITIVE.  CXR revealed 4.8 cm mass within the right upper lobe.  CTA Chest: Spiculated 4.7 cm right upper lobe mass consistent with malignancy. Diffuse interlobular septal thickening throughout the right lung concerning for lymphangitic spread of disease.2. Large pericardial effusion. 3. Lymphadenopathy within the mediastinum, right supraclavicular region, and  left axilla, concerning for metastatic disease.   Given abnormal  findings as above, cardiology was consulted who subsequently performed a bedside echocardiography (ECHO) showing cardiac tamponade physiology.Pulses paradoxus was also abnormal at 20-30 mm Hg. Due to progressive symptoms and findings as above, patient was taken to the cath lab urgently for pericardiocentesis and pericardial drain placement. 500cc of serosanguinous fluid was removed which improved the patient's symptoms remarkably. PCCM consulted for admission to the ICU.  Pertinent  Medical History  Graves Disease Tobacco use Micro Data:  7/19: SARS-CoV-2 PCR>> negative 7/19: Influenza PCR>> negative 7/19: Blood culture x2>> 7/19: Urine Culture>> negative 7/19: MRSA PCR>> negative 7/19: Pericardial fluid>> no growth in culture  Antimicrobials:  Cefepime 7/19>>7/19 Ceftriaxone 7/20>> 7/21  Consults:  Cardiology Hematology/Oncology  Significant Diagnostic Tests:  7/19: Renal Ultrasound: Negative 7/19: CXR: 4.8 cm mass within the right upper lobe 7/19: CTA Chest :Spiculated 4.7 cm right upper lobe mass consistent with malignancy. Diffuse interlobular septal thickening throughout the right lung concerning for lymphangitic spread of disease.2. Large pericardial effusion. 3. Lymphadenopathy within the mediastinum, right supraclavicular region, and left axilla, concerning for metastatic disease  Significant Hospital Events: Including procedures, antibiotic start and stop dates in addition to other pertinent events   7/19: Admitted to ICU with pericardial effusion and cardiac tamponade s/p pericardiocentesis and pericardial drain placement. 7/20: Oncology consulted, hemodynamically stable, plan to transfer out of ICU 7/21: transferred to [facility] service, pericardial drain pulled due to no significant output over previous 24 hours.  After drain removed patient having paroxysmal atrial fibrillation with a rapid rate in the 180s and  started on diltiazem infusion 7/22: Amiodarone drip added with extra IV bolus for rates in the  180s, diltiazem drip weaned due to hypotension.  EBUS recommended as outpatient procedure 7/23: Pericardial fluid cytology positive for malignancy.  Amiodarone drip increased to 60 mg an hour with additional bolus and diltiazem drip added back. 7/24: Worsening respiratory status due to increased work of breathing.  PCCM re-consulted > stat chest x-ray revealed increased pulmonary edema, bedside POC ultrasound negative for significant reaccumulation of pericardial effusion, BiPAP initiated.  Interim History / Subjective:  Patient appears in respiratory distress, sitting in tripod position complaining she is having difficulty catching her breath.  Nebulizer treatment given without impact, morphine 2 mg IV given with some positive effect.  Ventimask increased from 35% and 5L to 55% and 15 L. -Discussed case with Dr. Jonnie Finner who assisted in bedside POC ultrasound to assess for reaccumulation of pericardial effusion.  Very small amount of pericardial effusion noted.  -Stat chest x-ray revealed significant increase in pulmonary edema > Lasix ordered & BiPAP initiated Significant other and daughter bedside, all questions and concerns answered at this time.  Net: -690 mL (-1.2 since admit) Na+/ K+: 130/3.3 (K+ replaced) BUN/Cr.:  7/0.46 Serum CO2/ AG: 31/9  Hgb: 13.5 WBC/ TMAX: 17.7/37.2  ABG: 7.54/44/78/37.6 CXR 04/01/2021: Diffusely worsening interstitial opacity, concerning for developing edema as well as pulmonary vascular congestion-underlying lymphangitic spread cannot fully be excluded.  Additional opacity in the bases may reflect some passive atelectatic change or underlying airspace opacity.  Objective   Blood pressure (!) 146/78, pulse (!) 101, temperature 98.4 F (36.9 C), resp. rate 16, height 5' 1.5" (1.562 m), weight 62 kg, SpO2 95 %.    FiO2 (%):  [35 %] 35 %   Intake/Output Summary (Last  24 hours) at 04/01/2021 2130 Last data filed at 04/01/2021 1800 Gross per 24 hour  Intake 1127.41 ml  Output 1750 ml  Net -622.59 ml   Filed Weights   03/29/21 0500 03/31/21 0453 04/01/21 0500  Weight: 61.9 kg 62.4 kg 62 kg    Examination: General: Adult female, critically ill, sitting up in bed in tripod position stating she feels she cannot breathe HEENT: MM pink/moist, anicteric, atraumatic, neck supple Neuro: A&O x 4, able to follow commands, PERRL +3 , MAE CV: s1s2 RRR, ST on monitor, no r/m/g Pulm: Tachypneic, labored on Ventimask 55%/15 L, breath sounds coarse-BUL & diminished-BLL GI: soft, rounded, non tender, bs x 4 GU: Pure wick in place with clear yellow urine Skin: Limited exam- no rashes/lesions noted Extremities: warm/dry, pulses + 2 R/P, trace edema noted  Resolved Hospital Problem list   Sepsis with septic shock due to UTI > ruled out  Assessment & Plan:  Acute Hypoxic Respiratory Failure secondary to pulmonary edema in the setting of RUL malignant lung mass PMHx: Tobacco use - Lasix 80 mg IV x 1 - Continue BIPAP overnight, wean FiO2 as tolerated - Morphine 2 mg IV as needed every 4 for air hunger & work of breathing - Supplemental O2 to maintain SpO2 > 88% - Intermittent chest x-ray & ABG PRN - Ensure adequate pulmonary hygiene  - bronchodilators PRN -Encourage smoking cessation  Pericardial effusion with cardiac tamponade  in the setting of suspected Lung malignancy (possible lung adenocarcinoma) in patient with hx of smoking since age 39 S/p emergent pericardiocentesis with removal of 400 ml of hemorrhagic pericardial fluid  & pericardial  drainage tube placement CT chest shows spiculated 4.7 cm right upper lobe mass consistent with malignancy with mets.  Pericardial drain removed 03/29/21.  Cytology positive for malignancy, culture pending still. -Bedside  POC ultrasound performed with Dr. Jonnie Finner assistance, minimal pericardial effusion present -Follow-up  echocardiogram ordered in a.m. -Cardiology following, appreciate input -Oncology following, appreciate input -Dr. Patsey Berthold consulted for future EBUS procedure, timing to be determined   Paroxysmal Atrial Fibrillation with bursts of rapid Ventricular Response No systemic anticoagulation at this time due to hemorrhagic nature of pericardial fluid. - Cardizem infusion weaned off at 1630 -Continue amiodarone infusion at 60 mg/h -Initiate systemic anticoagulation, per cardiology recommendations - Cardiology consulted, appreciate input - Continuous cardiac monitoring  Graves Disease TSH elevated 8.1 repeat 3.2 ~ 3.0. Free T4 slightly elevated at 1.22 -PTU 100 mg every 8 initiated per primary service - f/u LFTs  Best Practice (right click and "Reselect all SmartList Selections" daily)  Diet/type: Regular consistency (see orders) DVT prophylaxis: LMWH GI prophylaxis: N/A Lines: N/A Foley:  N/A Code Status:  full code Last date of multidisciplinary goals of care discussion 04/01/21  Patient, significant other & daughter updated bedside on plan of care. Confirmed wishes of CODE STATUS as FULL code. All questions & concerns answered at this time.  Labs   CBC: Recent Labs  Lab 03/27/21 1502 03/28/21 0131 03/29/21 0413 04/01/21 0617  WBC 21.1*  21.7* 20.3* 16.7* 17.7*  NEUTROABS 15.1*  --   --   --   HGB 15.6*  15.7* 13.8 14.7 13.5  HCT 46.6*  45.4 40.5 43.7 39.5  MCV 88.4  88.0 87.7 86.5 87.4  PLT 490*  467* 377 423* 081    Basic Metabolic Panel: Recent Labs  Lab 03/28/21 0131 03/29/21 0413 03/30/21 0452 03/31/21 0444 04/01/21 0617  NA 135 139 132* 131* 130*  K 4.1 3.6 3.7 3.8 3.3*  CL 104 106 99 95* 90*  CO2 23 25 25 28 31   GLUCOSE 120* 113* 166* 152* 145*  BUN 22* 10 9 10 7   CREATININE 0.58 0.40* 0.43* 0.48 0.46  CALCIUM 8.6* 8.7* 8.4* 8.5* 8.9  MG 2.2  --   --   --   --   PHOS 3.3  --   --   --   --    GFR: Estimated Creatinine Clearance: 66.4 mL/min (by  C-G formula based on SCr of 0.46 mg/dL). Recent Labs  Lab 03/27/21 1502 03/27/21 1701 03/27/21 1751 03/27/21 2216 03/28/21 0131 03/29/21 0413 03/30/21 0452 04/01/21 0617  PROCALCITON <0.10  --   --   --   --  <0.10 <0.10  --   WBC 21.1*  21.7*  --   --   --  20.3* 16.7*  --  17.7*  LATICACIDVEN  --  5.3* 3.9* 2.5* 1.7  --   --   --     Liver Function Tests: Recent Labs  Lab 03/27/21 1635  AST 31  ALT 21  ALKPHOS 74  BILITOT 1.2  PROT 7.7  ALBUMIN 3.2*   No results for input(s): LIPASE, AMYLASE in the last 168 hours. No results for input(s): AMMONIA in the last 168 hours.  ABG    Component Value Date/Time   HCO3 27.0 03/30/2021 0038   O2SAT 90.4 03/30/2021 0038     Coagulation Profile: Recent Labs  Lab 03/27/21 1701  INR 1.1    Cardiac Enzymes: Recent Labs  Lab 03/27/21 1635  CKTOTAL 15*    HbA1C: Hgb A1c MFr Bld  Date/Time Value Ref Range Status  03/29/2021 09:48 AM 6.2 (H) 4.8 - 5.6 % Final    Comment:    (NOTE) Pre diabetes:  5.7%-6.4%  Diabetes:              >6.4%  Glycemic control for   <7.0% adults with diabetes     CBG: Recent Labs  Lab 03/27/21 2138  GLUCAP 101*    Review of Systems: Positives in BOLD  Gen: Denies fever, chills, weight change, fatigue, night sweats HEENT: Denies blurred vision, double vision, hearing loss, tinnitus, sinus congestion, rhinorrhea, sore throat, neck stiffness, dysphagia PULM: Denies shortness of breath, cough, sputum production, hemoptysis, wheezing CV: Denies chest pain, edema, orthopnea, paroxysmal nocturnal dyspnea, palpitations GI: Denies abdominal pain, nausea, vomiting, diarrhea, hematochezia, melena, constipation, change in bowel habits GU: Denies dysuria, hematuria, polyuria, oliguria, urethral discharge Endocrine: Denies hot or cold intolerance, polyuria, polyphagia or appetite change Derm: Denies rash, dry skin, scaling or peeling skin change Heme: Denies easy bruising, bleeding,  bleeding gums Neuro: Denies headache, numbness, weakness, slurred speech, loss of memory or consciousness  Past Medical History:  She,  has a past medical history of Graves disease.   Surgical History:   Past Surgical History:  Procedure Laterality Date   PERICARDIOCENTESIS N/A 03/27/2021   Procedure: PERICARDIOCENTESIS;  Surgeon: Nelva Bush, MD;  Location: [place] [facility];  Service: Cardiovascular;  Laterality: N/A;   TUBAL LIGATION       Social History:   reports that she has been smoking cigarettes. She has a 40.00 pack-year smoking history. She has never used smokeless tobacco. She reports current drug use. Drug: Marijuana. She reports that she does not drink alcohol.   Family History:  Her family history includes Peripheral Artery Disease in her mother.   Allergies Allergies  Allergen Reactions   Metoprolol    Tapazole [Methimazole]      Home Medications  Prior to Admission medications   Medication Sig Start Date End Date Taking? Authorizing Provider  albuterol (PROVENTIL HFA;VENTOLIN HFA) 108 (90 Base) MCG/ACT inhaler Inhale 2 puffs into the lungs every 6 (six) hours as needed. Patient not taking: Reported on 03/27/2021    [provider]  Multiple Vitamin (MULTI-VITAMINS) TABS Take 1 tablet by mouth daily. Patient not taking: Reported on 03/27/2021    [provider]     Critical care time: 45 minutes     Venetia Night, AGACNP-BC Acute Care Nurse Practitioner [place]   [phone] / [phone] Please see Amion for pager details.

## 2021-04-01 NOTE — Progress Notes (Addendum)
Progress Note  Patient Name: Nichole Park Date of Encounter: 04/01/2021  [facility] Cardiologist: None   Subjective   Patient resting No CP  Breathing is fair  Inpatient Medications    Scheduled Meds:  budesonide (PULMICORT) nebulizer solution  0.25 mg Nebulization BID   Chlorhexidine Gluconate Cloth  6 each Topical Q0600   enoxaparin (LOVENOX) injection  40 mg Subcutaneous Q24H   feeding supplement  237 mL Oral TID BM   furosemide  60 mg Intravenous Daily   ipratropium  0.5 mg Nebulization TID   levalbuterol  0.63 mg Nebulization TID   mouth rinse  15 mL Mouth Rinse BID   multivitamin with minerals  1 tablet Oral Daily   nicotine  21 mg Transdermal Daily   nystatin  5 mL Oral QID   ondansetron (ZOFRAN) IV  4 mg Intravenous Once   potassium chloride  40 mEq Oral Daily   Continuous Infusions:  amiodarone 60 mg/hr (04/01/21 1036)   diltiazem (CARDIZEM) infusion 5 mg/hr (03/31/21 1500)   PRN Meds: acetaminophen, chlorpheniramine-HYDROcodone, docusate sodium, guaiFENesin, guaiFENesin-dextromethorphan, ketorolac, LORazepam, polyethylene glycol   Vital Signs    Vitals:   04/01/21 0620 04/01/21 0700 04/01/21 0800 04/01/21 0900  BP:   (!) 154/77 106/83  Pulse: 95 90 94 94  Resp: (!) 22 (!) 26 (!) 22 (!) 21  Temp:   97.6 F (36.4 C)   TempSrc:      SpO2: 96% 94% 91% 92%  Weight:      Height:        Intake/Output Summary (Last 24 hours) at 04/01/2021 1107 Last data filed at 04/01/2021 1056 Gross per 24 hour  Intake 938.42 ml  Output 1650 ml  Net -711.58 ml   Last 3 Weights 04/01/2021 03/31/2021 03/29/2021  Weight (lbs) 136 lb 11 oz 137 lb 9.1 oz 136 lb 7.4 oz  Weight (kg) 62 kg 62.4 kg 61.9 kg      Telemetry    SR and sgirt bursts of atrial fibrillation   ECG    - Personally Reviewed  Physical Exam   GEN: No acute distress.   Neck: No JVD Cardiac: RRR, no murmurs Respiratory:Bilateral rhonchi, wheezes   GI: Soft, nontender, non-distended  MS: No  edema; No deformity. Neuro:  Nonfocal  Psych: Normal affect   Labs    High Sensitivity Troponin:  No results for input(s): TROPONINIHS in the last 720 hours.    Chemistry Recent Labs  Lab 03/27/21 1635 03/28/21 0131 03/30/21 0452 03/31/21 0444 04/01/21 0617  NA  --    < > 132* 131* 130*  K  --    < > 3.7 3.8 3.3*  CL  --    < > 99 95* 90*  CO2  --    < > 25 28 31   GLUCOSE  --    < > 166* 152* 145*  BUN  --    < > 9 10 7   CREATININE  --    < > 0.43* 0.48 0.46  CALCIUM  --    < > 8.4* 8.5* 8.9  PROT 7.7  --   --   --   --   ALBUMIN 3.2*  --   --   --   --   AST 31  --   --   --   --   ALT 21  --   --   --   --   ALKPHOS 74  --   --   --   --  BILITOT 1.2  --   --   --   --   GFRNONAA  --    < > >60 >60 >60  ANIONGAP  --    < > 8 8 9    < > = values in this interval not displayed.     Hematology Recent Labs  Lab 03/28/21 0131 03/29/21 0413 04/01/21 0617  WBC 20.3* 16.7* 17.7*  RBC 4.62 5.05 4.52  HGB 13.8 14.7 13.5  HCT 40.5 43.7 39.5  MCV 87.7 86.5 87.4  MCH 29.9 29.1 29.9  MCHC 34.1 33.6 34.2  RDW 12.6 12.6 12.5  PLT 377 423* 350    BNPNo results for input(s): BNP, PROBNP in the last 168 hours.   DDimer No results for input(s): DDIMER in the last 168 hours.   Radiology    No results found.  Cardiac Studies  Echocardiogram (7/21)_  1. Trvial pericardial effusion.   2. Left ventricular ejection fraction, by estimation, is 60 to 65%. The  left ventricle has normal function. The left ventricle has no regional  wall motion abnormalities.   3. Right ventricular systolic function is normal. The right ventricular  size is normal.   4. The mitral valve is normal in structure. No evidence of mitral valve  regurgitation. No evidence of mitral stenosis.    Patient Profile     60 y.o. female with history of Graves' disease, smoker x40+ years presenting with shortness of breath, found to have a large pericardial effusion, right upper lobe lung mass  suspicious for malignancy.  Assessment & Plan    Pericardial effusion  Pt s/p pericardiocentesis      Drain removed 3 days ago  Friday had echo with no reaccumulation  Cytology is positive for malignancy    Will defer to primary service to review with patient      Will need to follow closely given high chance for reaccumulation .  Will sched limited tomorrow just for effusion  Consider window if does    Will continue to follow  She is on qd lovenox.      2. Atrial fibrillation  Pt is mostly in SR with short bursts of afib   Few  Very short lived  Keep on higher dose of amiodarone gtt for now as load.   NOt a great drug given hx of pulmonary Hx but options limited   COmfort care.   Dilt was added back as a gtt at 5mg  /hour   No anticoagulation (systemic)  I am stopping lasix IV 60 mg daily order   Na low today  Follow I/O, electrolytes   Can dose as needed    3 Emphysema, lung mass -Management as per critical care team RUL lung malignancy with mediastinal metastasis.   3.  Smoking -Cessation has already been advised     For questions or updates, please contact [place] Please consult [email or web address] for contact info under        Signed, Dorris Carnes, MD  04/01/2021, 11:07 AM

## 2021-04-01 NOTE — Progress Notes (Addendum)
PROGRESS NOTE    Nichole Park  NFA:[id number] DOB: 04/23/1961 DOA: 03/27/2021 PCP: Pcp, No  Outpatient Specialists: none    Brief Narrative:   Hx graves disease, presenting with shortness of breath and chest discomfort, found to have large right upper lobar mass and pericardial effusion w/ tamponade physiology. S/p pericardial drain.    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 S/p pericardiocentesis with 400 ml drainage. Drain has since been removed. Cytology confirms malignancy. Remains tachypnic and SOB. CTA neg. She is a heavy smoker. TTE shows preserved EF, diastolic dysfunction. - EBUS with biopsy when patient stable, likely as outpt per Dr. Patsey Berthold - mgmt of effusion per cardiology who is following  # A fib w/ rvr # Acute hypoxic respiratory failure Difficulty controlling hr with dilt and also hypotensive on dilt, now on amio gtt, hr still difficult to control. CXR yesterday w/ pulmonary edema. Procalcitonin remains low, no new consolidation, do not think infection. Atelectasis and malignancy contributing to hypoxia - cont amio, per cardiology - cont lasix 60 iv qd - cont o2, will likely discharge with this - start incentive spirometry  # Hypokalemia K 3.3 likely 2/2 diuretic - start kcl 40 qd - mg daily  # Hyponatremia Likely siadh 2/2 lung process. Na 130 - monitor for now  # Acute cystitis? # sepsis ruled out Initially tachycardic, leukocytosis, elevated lactate. Urine suggestive of infection, culture negative. Abx discontinued  # Graves Disease TSH wnl, T4 mildly elevated. Hx diffuse  goiter and elevated TSIs (in care everywhere). Hx rash w/ methimazole, stopped that several years ago and hasn't had f/u.  - given difficult-to-control a fib, though it's primarily mediated by patient's malignancy, will trial starting propylthiouricil 100 tid, will need monitoring of LFTs  # Hyperglycemia Mild. A1c 6.2   DVT prophylaxis: lovenox Code Status: full Family Communication: husband updated @ bedside 7/24  Level of care: Stepdown Status is: Inpatient  Remains inpatient appropriate because:Inpatient level of care appropriate due to severity of illness  Dispo: The patient is from: Home              Anticipated d/c is to: Home, PT eval pending              Patient currently is not medically stable to d/c.   Difficult to place patient No        Consultants:  Cardiology, oncology  Procedures: Pericardial drain placement, now removed  Antimicrobials:  C/p cefepime/ceftriaxone   Subjective: Breathing stable.   Objective: Vitals:   04/01/21 0620 04/01/21 0700 04/01/21 0800 04/01/21 0900  BP:   (!) 154/77 106/83  Pulse: 95 90 94 94  Resp: (!) 22 (!) 26 (!) 22 (!) 21  Temp:   97.6 F (36.4 C)   TempSrc:      SpO2: 96% 94% 91% 92%  Weight:      Height:        Intake/Output Summary (Last 24 hours) at 04/01/2021 1038 Last data filed at 04/01/2021 0639 Gross per 24 hour  Intake 938.42 ml  Output 1050 ml  Net -111.58 ml   Filed Weights   03/29/21  0500 03/31/21 0453 04/01/21 0500  Weight: 61.9 kg 62.4 kg 62 kg    Examination:  General exam: chronically ill appearing Respiratory system: decreased breath sounds right upper, rales at bases Cardiovascular system: S1 & S2 heard, soft systolic murmur Gastrointestinal system: Abdomen is nondistended, soft and nontender. No organomegaly or masses felt. Normal bowel sounds heard. Central nervous system: Alert and oriented. No focal neurological deficits. Extremities: Symmetric 5 x 5 power. Skin: No rashes,  lesions or ulcers Psychiatry: Judgement and insight appear normal. Mood & affect appropriate.     Data Reviewed: I have personally reviewed following labs and imaging studies  CBC: Recent Labs  Lab 03/27/21 1502 03/28/21 0131 03/29/21 0413 04/01/21 0617  WBC 21.1*  21.7* 20.3* 16.7* 17.7*  NEUTROABS 15.1*  --   --   --   HGB 15.6*  15.7* 13.8 14.7 13.5  HCT 46.6*  45.4 40.5 43.7 39.5  MCV 88.4  88.0 87.7 86.5 87.4  PLT 490*  467* 377 423* 973   Basic Metabolic Panel: Recent Labs  Lab 03/28/21 0131 03/29/21 0413 03/30/21 0452 03/31/21 0444 04/01/21 0617  NA 135 139 132* 131* 130*  K 4.1 3.6 3.7 3.8 3.3*  CL 104 106 99 95* 90*  CO2 23 25 25 28 31   GLUCOSE 120* 113* 166* 152* 145*  BUN 22* 10 9 10 7   CREATININE 0.58 0.40* 0.43* 0.48 0.46  CALCIUM 8.6* 8.7* 8.4* 8.5* 8.9  MG 2.2  --   --   --   --   PHOS 3.3  --   --   --   --    GFR: Estimated Creatinine Clearance: 66.4 mL/min (by C-G formula based on SCr of 0.46 mg/dL). Liver Function Tests: Recent Labs  Lab 03/27/21 1635  AST 31  ALT 21  ALKPHOS 74  BILITOT 1.2  PROT 7.7  ALBUMIN 3.2*   No results for input(s): LIPASE, AMYLASE in the last 168 hours. No results for input(s): AMMONIA in the last 168 hours. Coagulation Profile: Recent Labs  Lab 03/27/21 1701  INR 1.1   Cardiac Enzymes: Recent Labs  Lab 03/27/21 1635  CKTOTAL 15*   BNP (last 3 results) No results for input(s): PROBNP in the last 8760 hours. HbA1C: No results for input(s): HGBA1C in the last 72 hours.  CBG: Recent Labs  Lab 03/27/21 2138  GLUCAP 101*   Lipid Profile: No results for input(s): CHOL, HDL, LDLCALC, TRIG, CHOLHDL, LDLDIRECT in the last 72 hours. Thyroid Function Tests: No results for input(s): TSH, T4TOTAL, FREET4, T3FREE, THYROIDAB in the last 72 hours.  Anemia Panel: No results for input(s): VITAMINB12, FOLATE, FERRITIN, TIBC, IRON, RETICCTPCT in the last 72 hours. Urine analysis:    Component Value  Date/Time   COLORURINE AMBER (A) 03/27/2021 1701   APPEARANCEUR CLOUDY (A) 03/27/2021 1701   APPEARANCEUR Clear 12/01/2013 1439   LABSPEC 1.028 03/27/2021 1701   LABSPEC 1.005 12/01/2013 1439   PHURINE 5.0 03/27/2021 1701   GLUCOSEU 50 (A) 03/27/2021 1701   GLUCOSEU Negative 12/01/2013 1439   HGBUR NEGATIVE 03/27/2021 1701   BILIRUBINUR SMALL (A) 03/27/2021 1701   BILIRUBINUR Negative 12/01/2013 1439   KETONESUR NEGATIVE 03/27/2021 1701   PROTEINUR 30 (A) 03/27/2021 1701   NITRITE NEGATIVE 03/27/2021 1701   LEUKOCYTESUR MODERATE (A) 03/27/2021 1701   LEUKOCYTESUR 1+ 12/01/2013 1439   Sepsis Labs: @LABRCNTIP (procalcitonin:4,lacticidven:4)  ) Recent Results (from the past 240 hour(s))  Blood culture (routine single)     Status: None (Preliminary result)  Collection Time: 03/27/21  5:01 PM   Specimen: BLOOD  Result Value Ref Range Status   Specimen Description BLOOD RIGHT ANTECUBITAL  Final   Special Requests   Final    BOTTLES DRAWN AEROBIC AND ANAEROBIC Blood Culture adequate volume   Culture   Final    NO GROWTH 4 DAYS Performed at [facility], [address], [place], [place] [address]    Report Status PENDING  Incomplete  Resp Panel by RT-PCR (Flu A&B, Covid) Nasopharyngeal Swab     Status: None   Collection Time: 03/27/21  5:01 PM   Specimen: Nasopharyngeal Swab; Nasopharyngeal(NP) swabs in vial transport medium  Result Value Ref Range Status   SARS Coronavirus 2 by RT PCR NEGATIVE NEGATIVE Final    Comment: (NOTE) SARS-CoV-2 target nucleic acids are NOT DETECTED.  The SARS-CoV-2 RNA is generally detectable in upper respiratory specimens during the acute phase of infection. The lowest concentration of SARS-CoV-2 viral copies this assay can detect is 138 copies/mL. A negative result does not preclude SARS-Cov-2 infection and should not be used as the sole basis for treatment or other patient management decisions. A negative result may occur with   improper specimen collection/handling, submission of specimen other than nasopharyngeal swab, presence of viral mutation(s) within the areas targeted by this assay, and inadequate number of viral copies(<138 copies/mL). A negative result must be combined with clinical observations, patient history, and epidemiological information. The expected result is Negative.  Fact Sheet for Patients:  EntrepreneurPulse.com.au  Fact Sheet for Healthcare Providers:  IncredibleEmployment.be  This test is no t yet approved or cleared by the [place] FDA and  has been authorized for detection and/or diagnosis of SARS-CoV-2 by FDA under an Emergency Use Authorization (EUA). This EUA will remain  in effect (meaning this test can be used) for the duration of the COVID-19 declaration under Section 564(b)(1) of the Act, 21 U.S.C.section 360bbb-3(b)(1), unless the authorization is terminated  or revoked sooner.       Influenza A by PCR NEGATIVE NEGATIVE Final   Influenza B by PCR NEGATIVE NEGATIVE Final    Comment: (NOTE) The Xpert Xpress SARS-CoV-2/FLU/RSV plus assay is intended as an aid in the diagnosis of influenza from Nasopharyngeal swab specimens and should not be used as a sole basis for treatment. Nasal washings and aspirates are unacceptable for Xpert Xpress SARS-CoV-2/FLU/RSV testing.  Fact Sheet for Patients: EntrepreneurPulse.com.au  Fact Sheet for Healthcare Providers: IncredibleEmployment.be  This test is not yet approved or cleared by the [place] FDA and has been authorized for detection and/or diagnosis of SARS-CoV-2 by FDA under an Emergency Use Authorization (EUA). This EUA will remain in effect (meaning this test can be used) for the duration of the COVID-19 declaration under Section 564(b)(1) of the Act, 21 U.S.C. section 360bbb-3(b)(1), unless the authorization is terminated  or revoked.  Performed at [facility], [address], [place], [place] [address]   Urine Culture     Status: Abnormal   Collection Time: 03/27/21  5:01 PM   Specimen: In/Out Cath Urine  Result Value Ref Range Status   Specimen Description   Final    IN/OUT CATH URINE Performed at [facility], [address] [place], [place] [address]    Special Requests   Final    NONE Performed at [facility], [address] [place], [place] [address]    Culture (A)  Final    <10,000 COLONIES/mL INSIGNIFICANT GROWTH Performed at [facility], [address] [place], [place] [address]    Report Status 03/29/2021 FINAL  Final  Blood culture (single)     Status: None (Preliminary result)   Collection Time: 03/27/21  6:05 PM   Specimen: BLOOD  Result Value Ref Range Status   Specimen Description BLOOD LEFT ANTECUBITAL  Final   Special Requests   Final    BOTTLES DRAWN AEROBIC AND ANAEROBIC Blood Culture results may not be optimal due to an inadequate volume of blood received in culture bottles   Culture   Final    NO GROWTH 4 DAYS Performed at [facility], [address], [place], [place] [address]    Report Status PENDING  Incomplete  Body fluid culture w Gram Stain     Status: None   Collection Time: 03/27/21  8:26 PM   Specimen: Pericardial; Body Fluid  Result Value Ref Range Status   Specimen Description   Final    PERICARDIAL Performed at [facility], [address] [place], [place] [address]    Special Requests   Final    NONE Performed at [facility], [address] [place], [place] [address]    Gram Stain   Final    RARE WBC PRESENT,BOTH PMN AND MONONUCLEAR NO ORGANISMS SEEN    Culture   Final    NO GROWTH 3 DAYS Performed at [facility], [place] [address] [place], [place] [address]    Report Status 03/31/2021 FINAL  Final  Acid Fast Smear (AFB)     Status: None   Collection Time: 03/27/21   8:26 PM   Specimen: Pericardial Fluid  Result Value Ref Range Status   AFB Specimen Processing Concentration  Final   Acid Fast Smear Negative  Final    Comment: (NOTE) Performed At: [facility] [address] [place], [place] [id number] Rush Farmer MD YO:[id number]    Source (AFB) FLUID  Final    Comment: Performed at [facility], [address] [place], [place] [address]  Culture, blood (routine x 2)     Status: None (Preliminary result)   Collection Time: 03/27/21 10:18 PM   Specimen: BLOOD RIGHT FOREARM  Result Value Ref Range Status   Specimen Description BLOOD RIGHT FOREARM  Final   Special Requests   Final    BOTTLES DRAWN AEROBIC AND ANAEROBIC Blood Culture adequate volume   Culture   Final    NO GROWTH 4 DAYS Performed at [facility], [address], [place], [place] [address]    Report Status PENDING  Incomplete  MRSA Next Gen by PCR, Nasal     Status: None   Collection Time: 03/27/21 11:01 PM   Specimen: Nasal Mucosa; Nasal Swab  Result Value Ref Range Status   MRSA by PCR Next Gen NOT DETECTED NOT DETECTED Final    Comment: (NOTE) The GeneXpert MRSA Assay (FDA approved for NASAL specimens only), is one component of a comprehensive MRSA colonization surveillance program. It is not intended to diagnose MRSA infection nor to guide or monitor treatment for MRSA infections. Test performance is not FDA approved in patients less than 1 years old. Performed at [facility], [address] [place], [place] [address]          Radiology Studies: No results found.      Scheduled Meds:  budesonide (PULMICORT) nebulizer solution  0.25 mg Nebulization BID   Chlorhexidine Gluconate Cloth  6 each Topical Q0600   enoxaparin (LOVENOX)  injection  40 mg Subcutaneous Q24H   feeding supplement  237 mL Oral TID BM   furosemide  60 mg Intravenous Daily   ipratropium  0.5 mg Nebulization TID   ketorolac  30 mg Intravenous Once    levalbuterol  0.63 mg Nebulization TID   mouth rinse  15 mL Mouth Rinse BID   multivitamin with minerals  1 tablet Oral Daily   nicotine  21 mg Transdermal Daily   nystatin  5 mL Oral QID   ondansetron (ZOFRAN) IV  4 mg Intravenous Once   Continuous Infusions:  amiodarone 60 mg/hr (04/01/21 1036)   diltiazem (CARDIZEM) infusion 5 mg/hr (03/31/21 1500)     LOS: 5 days    Time spent: 30 min    Desma Maxim, MD [facility]   If 7PM-7AM, please contact night-coverage [email or web address] Password [facility] 04/01/2021, 10:38 AM

## 2021-04-02 ENCOUNTER — Inpatient Hospital Stay (HOSPITAL_COMMUNITY)
Admit: 2021-04-02 | Discharge: 2021-04-02 | Disposition: A | Discharge status: Home / Self Care | Payer: Medicaid Other | Attending: Medical | Admitting: Medical

## 2021-04-02 DIAGNOSIS — I313 Pericardial effusion (noninflammatory): Secondary | ICD-10-CM

## 2021-04-02 DIAGNOSIS — C801 Malignant (primary) neoplasm, unspecified: Secondary | ICD-10-CM

## 2021-04-02 DIAGNOSIS — Z515 Encounter for palliative care: Secondary | ICD-10-CM

## 2021-04-02 DIAGNOSIS — R918 Other nonspecific abnormal finding of lung field: Secondary | ICD-10-CM

## 2021-04-02 LAB — CBC WITH DIFFERENTIAL/PLATELET
Abs Immature Granulocytes: 0.09 10*3/uL — ABNORMAL HIGH (ref 0.00–0.07)
Basophils Absolute: 0 10*3/uL (ref 0.0–0.1)
Basophils Relative: 0 %
Eosinophils Absolute: 0.1 10*3/uL (ref 0.0–0.5)
Eosinophils Relative: 0 %
HCT: 41.7 % (ref 36.0–46.0)
Hemoglobin: 14 g/dL (ref 12.0–15.0)
Immature Granulocytes: 1 %
Lymphocytes Relative: 16 %
Lymphs Abs: 2.9 10*3/uL (ref 0.7–4.0)
MCH: 28.9 pg (ref 26.0–34.0)
MCHC: 33.6 g/dL (ref 30.0–36.0)
MCV: 86 fL (ref 80.0–100.0)
Monocytes Absolute: 2.1 10*3/uL — ABNORMAL HIGH (ref 0.1–1.0)
Monocytes Relative: 12 %
Neutro Abs: 12.6 10*3/uL — ABNORMAL HIGH (ref 1.7–7.7)
Neutrophils Relative %: 71 %
Platelets: 440 10*3/uL — ABNORMAL HIGH (ref 150–400)
RBC: 4.85 MIL/uL (ref 3.87–5.11)
RDW: 12.6 % (ref 11.5–15.5)
WBC: 17.8 10*3/uL — ABNORMAL HIGH (ref 4.0–10.5)
nRBC: 0 % (ref 0.0–0.2)

## 2021-04-02 LAB — COMPREHENSIVE METABOLIC PANEL
ALT: 26 U/L (ref 0–44)
AST: 33 U/L (ref 15–41)
Albumin: 2.3 g/dL — ABNORMAL LOW (ref 3.5–5.0)
Alkaline Phosphatase: 63 U/L (ref 38–126)
Anion gap: 9 (ref 5–15)
BUN: 11 mg/dL (ref 6–20)
CO2: 32 mmol/L (ref 22–32)
Calcium: 8.3 mg/dL — ABNORMAL LOW (ref 8.9–10.3)
Chloride: 88 mmol/L — ABNORMAL LOW (ref 98–111)
Creatinine, Ser: 0.52 mg/dL (ref 0.44–1.00)
GFR, Estimated: >60 mL/min (ref >60)
Glucose, Bld: 152 mg/dL — ABNORMAL HIGH (ref 70–99)
Potassium: 3.5 mmol/L (ref 3.5–5.1)
Sodium: 129 mmol/L — ABNORMAL LOW (ref 135–145)
Total Bilirubin: 0.5 mg/dL (ref 0.3–1.2)
Total Protein: 6.4 g/dL — ABNORMAL LOW (ref 6.5–8.1)

## 2021-04-02 LAB — CULTURE, BLOOD (SINGLE)
Culture: NO GROWTH
Culture: NO GROWTH
Special Requests: ADEQUATE

## 2021-04-02 LAB — PHOSPHORUS: Phosphorus: 3.3 mg/dL (ref 2.5–4.6)

## 2021-04-02 LAB — ECHOCARDIOGRAM LIMITED
Height: 61.5 in
S' Lateral: 2.58 cm
Weight: 2201.07 oz

## 2021-04-02 LAB — CULTURE, BLOOD (ROUTINE X 2)
Culture: NO GROWTH
Special Requests: ADEQUATE

## 2021-04-02 LAB — MAGNESIUM: Magnesium: 2.1 mg/dL (ref 1.7–2.4)

## 2021-04-02 MED ORDER — PHENYLEPHRINE HCL-NACL 10-0.9 MG/250ML-% IV SOLN
25.0000 ug/min | INTRAVENOUS | Status: DC
  Administered 2021-04-02: 25 ug/min via INTRAVENOUS
  Filled 2021-04-02: qty 250

## 2021-04-02 MED ORDER — AMIODARONE HCL 200 MG PO TABS
400.0000 mg | ORAL_TABLET | Freq: Two times a day (BID) | ORAL | Status: DC
  Administered 2021-04-02 (×2): 400 mg via ORAL
  Filled 2021-04-02 (×2): qty 2

## 2021-04-02 MED ORDER — ARFORMOTEROL TARTRATE 15 MCG/2ML IN NEBU
15.0000 ug | INHALATION_SOLUTION | Freq: Two times a day (BID) | RESPIRATORY_TRACT | Status: DC
  Administered 2021-04-02 – 2021-04-10 (×13): 15 ug via RESPIRATORY_TRACT
  Filled 2021-04-02 (×18): qty 2

## 2021-04-02 MED ORDER — SODIUM CHLORIDE 1 G PO TABS
1.0000 g | ORAL_TABLET | Freq: Three times a day (TID) | ORAL | Status: DC
  Administered 2021-04-02 – 2021-04-10 (×25): 1 g via ORAL
  Filled 2021-04-02 (×27): qty 1

## 2021-04-02 MED ORDER — REVEFENACIN 175 MCG/3ML IN SOLN
175.0000 ug | Freq: Every day | RESPIRATORY_TRACT | Status: DC
  Administered 2021-04-02 – 2021-04-10 (×7): 175 ug via RESPIRATORY_TRACT
  Filled 2021-04-02 (×9): qty 3

## 2021-04-02 MED ORDER — LEVALBUTEROL HCL 0.63 MG/3ML IN NEBU: 0.6300 mg | INHALATION_SOLUTION | Freq: Four times a day (QID) | RESPIRATORY_TRACT | Status: DC | PRN

## 2021-04-02 MED ORDER — SODIUM CHLORIDE 0.9 % IV SOLN: 250.0000 mL | INTRAVENOUS | Status: DC

## 2021-04-02 MED ORDER — METHYLPREDNISOLONE SODIUM SUCC 40 MG IJ SOLR
40.0000 mg | Freq: Two times a day (BID) | INTRAMUSCULAR | Status: DC
  Administered 2021-04-02 – 2021-04-05 (×8): 40 mg via INTRAVENOUS
  Filled 2021-04-02 (×7): qty 1

## 2021-04-02 NOTE — Progress Notes (Signed)
Chaplain Maggie made follow up visit with patient and her partner. Space was made for listening and prayer. Patient has received news today of need for palliative care. Continued support available per on call chaplain as needed.

## 2021-04-02 NOTE — Progress Notes (Signed)
*  PRELIMINARY RESULTS* Echocardiogram 2D Echocardiogram has been performed.  Sherrie Sport 04/02/2021, 9:14 AM

## 2021-04-02 NOTE — Progress Notes (Signed)
NAME:  Nichole Park, MRN:  [id number], DOB:  March 13, 1961, LOS: 6 ADMISSION DATE:  03/27/2021, CONSULTATION DATE:  03/27/2021 REFERRING MD:  Dr. Saunders Revel, CHIEF COMPLAINT:  SOB   Brief Pt Description / Synopsis:  60 y.o. 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   History of Present Illness:  A 60 year old  female who presented to the [facility] ED from [facility] walk in Clinic with sharp epigastric pain, progressive SOB onset 3 weeks ago associated with generalized weakness. Patient reported generalized weakness and mild left-sided chest discomfort that worsened with inspiration and was not associated with exertion. She reports intermittent systemic symptoms of fevers, and weight loss (8-10lbs over a month) , productive cough, and dark urine. She has history of cigarette smoking 1 pack /day since the age of 60 years old. When the symptoms worsened yesterday, she went to walk in clinic who sent him to the emergency department for further evaluation of these symptoms.   ED Course: On arrival to the ED, she was afebrile with blood pressure 102/80 mm Hg and pulse rate 119 beats/min, RR 25 breaths per minute, oxygen saturation was over 95% on room air.T Laboratory evaluation was significant for  WBC/Hgb/Hct/Plts:  21.7, 21.1/15.7, 15.6/45.4, 46.6/467, 490 (07/19 1502). Na+ 131, CO2 20, glucose 144, BUN 34, otherwise unremarkable CMP. Other Labs: Baseline procalcitonin <0.10, TSH 8.150, Lactate: 5.3>3.9, hcg 64. UA: Positive for UTI, Pregnancy, Urine POSITIVE.  CXR revealed 4.8 cm mass within the right upper lobe.  CTA Chest: Spiculated 4.7 cm right upper lobe mass consistent with malignancy. Diffuse interlobular septal thickening throughout the right lung concerning for lymphangitic spread of disease.2. Large pericardial effusion. 3. Lymphadenopathy within the mediastinum, right supraclavicular region, and  left axilla, concerning for metastatic disease.   Given abnormal  findings as above, cardiology was consulted who subsequently performed a bedside echocardiography (ECHO) showing cardiac tamponade physiology.Pulses paradoxus was also abnormal at 20-30 mm Hg. Due to progressive symptoms and findings as above, patient was taken to the cath lab urgently for pericardiocentesis and pericardial drain placement. 500cc of serosanguinous fluid was removed which improved the patient's symptoms remarkably. PCCM consulted for admission to the ICU.  Pertinent  Medical History  Graves Disease Tobacco use Micro Data:  7/19: SARS-CoV-2 PCR>> negative 7/19: Influenza PCR>> negative 7/19: Blood culture x2>> 7/19: Urine Culture>> negative 7/19: MRSA PCR>> negative 7/19: Pericardial fluid>> no growth in culture  Antimicrobials:  Cefepime 7/19>>7/19 Ceftriaxone 7/20>> 7/21  Consults:  Cardiology Hematology/Oncology  Significant Diagnostic Tests:  7/19: Renal Ultrasound: Negative 7/19: CXR: 4.8 cm mass within the right upper lobe 7/19: CTA Chest :Spiculated 4.7 cm right upper lobe mass consistent with malignancy. Diffuse interlobular septal thickening throughout the right lung concerning for lymphangitic spread of disease.2. Large pericardial effusion. 3. Lymphadenopathy within the mediastinum, right supraclavicular region, and left axilla, concerning for metastatic disease  Significant Hospital Events: Including procedures, antibiotic start and stop dates in addition to other pertinent events   7/19: Admitted to ICU with pericardial effusion and cardiac tamponade s/p pericardiocentesis and pericardial drain placement. 7/20: Oncology consulted, hemodynamically stable, plan to transfer out of ICU 7/21: transferred to [facility] service, pericardial drain pulled due to no significant output over previous 24 hours.  After drain removed patient having paroxysmal atrial fibrillation with a rapid rate in the 180s and  started on diltiazem infusion 7/22: Amiodarone drip added with extra IV bolus for rates in the  180s, diltiazem drip weaned due to hypotension.  EBUS recommended as outpatient procedure 7/23: Pericardial fluid cytology positive for malignancy.  Amiodarone drip increased to 60 mg an hour with additional bolus and diltiazem drip added back. 7/24: Worsening respiratory status due to increased work of breathing.  PCCM re-consulted > stat chest x-ray revealed increased pulmonary edema, bedside POC ultrasound negative for significant reaccumulation of pericardial effusion, BiPAP initiated. 7/25: Remains in the SDU, pericardial cytology positive for carcinoma, further stains pending  Interim History / Subjective:  She continues to have tachypnea but overall states feeling better.  Currently on nasal cannula O2, responded to diuretics.  Continue diuresis as tolerated.Significant other at bedside, all questions and concerns answered at this time.  CXR 04/01/2021: Diffusely worsening interstitial opacity, concerning for developing edema as well as pulmonary vascular congestion-underlying lymphangitic spread cannot fully be excluded.  Additional opacity in the bases may reflect some passive atelectatic change or underlying airspace opacity.  Independently reviewed:   Objective   Blood pressure 106/77, pulse 87, temperature (!) 96.6 F (35.9 C), temperature source Axillary, resp. rate (!) 22, height 5' 1.5" (1.562 m), weight 62.4 kg, SpO2 (!) 89 %.    FiO2 (%):  [32 %-35 %] 32 %   Intake/Output Summary (Last 24 hours) at 04/02/2021 0927 Last data filed at 04/02/2021 0436 Gross per 24 hour  Intake 907.41 ml  Output 2050 ml  Net -1142.59 ml    Filed Weights   03/31/21 0453 04/01/21 0500 04/02/21 0500  Weight: 62.4 kg 62 kg 62.4 kg    Examination: General: Adult female, acute on chronically ill, sitting up in bed, tachypneic but no conversational dyspnea.  HEENT: MM pink/moist, anicteric, atraumatic,  neck supple Neuro: A&O x 4, able to follow commands, PERRL +3 , MAE CV: s1s2 RRR, ST on monitor, no r/m/g Pulm: Tachypneic, breath sounds coarse-BUL & diminished-BLL, scattered wheezes noted GI: soft, rounded, non tender, bs x 4 GU: Pure wick in place with clear yellow urine Skin: Limited exam- no rashes/lesions noted Extremities: warm/dry, pulses + 2 R/P, trace edema noted  Resolved Hospital Problem list   Sepsis with septic shock due to UTI > ruled out  Assessment & Plan:  Acute Hypoxic Respiratory Failure secondary to pulmonary edema in the setting of RUL malignant lung mass PMHx: Tobacco use, COPD by CT findings - Continue diuretics as tolerated - Continue supplemental oxygen to maintain O2 sats 88 to 92% - Morphine 2 mg IV as needed every 4 for air hunger & work of breathing - Intermittent chest x-ray & ABG PRN - Ensure adequate pulmonary hygiene  - bronchodilators: Brovana/Pulmicort, Yupelri, as needed levo albuterol - IV Solu-Medrol milligrams IV every 12 - Encourage smoking cessation  Pericardial effusion with cardiac tamponade  in the setting of suspected Lung malignancy (possible lung adenocarcinoma) in patient with hx of smoking since age 13 S/p emergent pericardiocentesis with removal of 400 ml of hemorrhagic pericardial fluid  & pericardial  drainage tube placement CT chest shows spiculated 4.7 cm right upper lobe mass consistent with malignancy with mets.  Pericardial drain removed 03/29/21.  Cytology positive for malignancy, special stains ordered, culture pending still. -Echocardiogram shows no reaccumulation of fluid however there appears to be echogenic material in the pericardial space that is suspected to be tumor -Cardiology following, appreciate input -Oncology following, appreciate input -Is not a candidate for EBUS procedure due to the inability to tolerate general anesthesia in her current condition -Recommend consultation with palliative care/oncology.    Paroxysmal  Atrial Fibrillation with bursts of rapid Ventricular Response No systemic anticoagulation at this time due to hemorrhagic nature of pericardial fluid. - Cardizem off - Continue amiodarone infusion at 60 mg/h - Cardiology consulted, appreciate input - Continuous cardiac monitoring  Graves Disease TSH elevated 8.1 repeat 3.2 ~ 3.0. Free T4 slightly elevated at 1.22 -PTU 100 mg every 8 initiated per primary service - f/u LFTs  Best Practice (right click and "Reselect all SmartList Selections" daily)  Diet/type: Regular consistency (see orders) DVT prophylaxis: LMWH GI prophylaxis: N/A Lines: N/A Foley:  N/A Code Status:  full code Last date of multidisciplinary goals of care discussion 04/01/21  Patient, and significant other updated bedside on plan of care. Confirmed wishes of CODE STATUS as FULL code. All questions & concerns answered at this time.  Labs   CBC: Recent Labs  Lab 03/27/21 1502 03/28/21 0131 03/29/21 0413 04/01/21 0617 04/02/21 0209  WBC 21.1*  21.7* 20.3* 16.7* 17.7* 17.8*  NEUTROABS 15.1*  --   --   --  12.6*  HGB 15.6*  15.7* 13.8 14.7 13.5 14.0  HCT 46.6*  45.4 40.5 43.7 39.5 41.7  MCV 88.4  88.0 87.7 86.5 87.4 86.0  PLT 490*  467* 377 423* 350 440*     Basic Metabolic Panel: Recent Labs  Lab 03/28/21 0131 03/29/21 0413 03/30/21 0452 03/31/21 0444 04/01/21 0617 04/02/21 0209  NA 135 139 132* 131* 130* 129*  K 4.1 3.6 3.7 3.8 3.3* 3.5  CL 104 106 99 95* 90* 88*  CO2 23 25 25 28 31  32  GLUCOSE 120* 113* 166* 152* 145* 152*  BUN 22* 10 9 10 7 11   CREATININE 0.58 0.40* 0.43* 0.48 0.46 0.52  CALCIUM 8.6* 8.7* 8.4* 8.5* 8.9 8.3*  MG 2.2  --   --   --   --  2.1  PHOS 3.3  --   --   --   --  3.3    GFR: Estimated Creatinine Clearance: 66.6 mL/min (by C-G formula based on SCr of 0.52 mg/dL). Recent Labs  Lab 03/27/21 1502 03/27/21 1701 03/27/21 1751 03/27/21 2216 03/28/21 0131 03/29/21 0413 03/30/21 0452 04/01/21 0617  04/02/21 0209  PROCALCITON <0.10  --   --   --   --  <0.10 <0.10  --   --   WBC 21.1*  21.7*  --   --   --  20.3* 16.7*  --  17.7* 17.8*  LATICACIDVEN  --  5.3* 3.9* 2.5* 1.7  --   --   --   --      Liver Function Tests: Recent Labs  Lab 03/27/21 1635 04/02/21 0209  AST 31 33  ALT 21 26  ALKPHOS 74 63  BILITOT 1.2 0.5  PROT 7.7 6.4*  ALBUMIN 3.2* 2.3*    No results for input(s): LIPASE, AMYLASE in the last 168 hours. No results for input(s): AMMONIA in the last 168 hours.  ABG    Component Value Date/Time   PHART 7.54 (H) 04/01/2021 2109   PCO2ART 44 04/01/2021 2109   PO2ART 78 (L) 04/01/2021 2109   HCO3 37.6 (H) 04/01/2021 2109   O2SAT 96.8 04/01/2021 2109      Coagulation Profile: Recent Labs  Lab 03/27/21 1701  INR 1.1     Cardiac Enzymes: Recent Labs  Lab 03/27/21 1635  CKTOTAL 15*     HbA1C: Hgb A1c MFr Bld  Date/Time Value Ref Range Status  03/29/2021 09:48 AM 6.2 (H) 4.8 - 5.6 % Final  Comment:    (NOTE) Pre diabetes:          5.7%-6.4%  Diabetes:              >6.4%  Glycemic control for   <7.0% adults with diabetes     CBG: Recent Labs  Lab 03/27/21 2138  GLUCAP 101*     Review of Systems: Positives in BOLD  A 10 point review of systems was performed and it is as noted above otherwise negative.  Allergies Allergies  Allergen Reactions   Metoprolol    Tapazole [Methimazole]     Scheduled Meds:  amiodarone  400 mg Oral BID   arformoterol  15 mcg Nebulization BID   budesonide (PULMICORT) nebulizer solution  0.25 mg Nebulization BID   Chlorhexidine Gluconate Cloth  6 each Topical Q0600   enoxaparin (LOVENOX) injection  40 mg Subcutaneous Q24H   feeding supplement  237 mL Oral TID BM   mouth rinse  15 mL Mouth Rinse BID   methylPREDNISolone (SOLU-MEDROL) injection  40 mg Intravenous Q12H   multivitamin with minerals  1 tablet Oral Daily   nicotine  21 mg Transdermal Daily   nystatin  5 mL Oral QID   potassium  chloride  40 mEq Oral Daily   propylthiouracil  100 mg Oral Q8H   revefenacin  175 mcg Nebulization Daily   sodium chloride  1 g Oral TID WC   Continuous Infusions:  sodium chloride Stopped (04/02/21 0226)   diltiazem (CARDIZEM) infusion Stopped (03/31/21 1628)   PRN Meds:.acetaminophen, chlorpheniramine-HYDROcodone, docusate sodium, guaiFENesin, guaiFENesin-dextromethorphan, ketorolac, levalbuterol, LORazepam, morphine injection, polyethylene glycol   Critical care time: 35 minutes   Overall prognosis is exceedingly guarded given the extent of disease.  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  Renold Don, MD Advanced Bronchoscopy PCCM [facility]  *This note was dictated using voice recognition software/Dragon.  Despite best efforts to proofread, errors can occur which can change the meaning.  Any change was purely unintentional.

## 2021-04-02 NOTE — Progress Notes (Signed)
PROGRESS NOTE    Nichole Park  NGE:[id number] DOB: [birth year] DOA: 03/27/2021 PCP: Pcp, No  Outpatient Specialists: none    Brief Narrative:   Hx graves disease, presenting with shortness of breath and chest discomfort, found to have large right upper lobar mass and pericardial effusion w/ tamponade physiology. S/p pericardial drain.    Assessment & Plan:   Principal Problem:   Cardiac tamponade Active Problems:   Cardiac/pericardial tamponade   Pericardial effusion   Tobacco use   PAF (paroxysmal atrial fibrillation) (HCC)   COPD (chronic obstructive pulmonary disease) (HCC)   Lung mass   # Pericardial effusion  # Cardiac tamponade # Lung cancer # Tobacco abuse In setting of 4.7 cm right upper lobe mass consistent w/ malignancy with lymphangitic spread. S/p pericardiocentesis with 400 ml drainage. Drain has since been removed. Cytology confirms malignancy. Remains tachypnic and SOB. CTA neg. She is a heavy smoker. TTE shows preserved EF, diastolic dysfunction. Pulm thinks stage 4 adenocarcinoma - EBUS with biopsy when patient stable, likely as outpt per Dr. Patsey Berthold - mgmt of effusion per cardiology who is following. Repeat tte today - onc palliative will contact  # Acute hypoxic respiratory failure 2/2 lung cancer. With lymphangitic spread. A fib likely contributory, may have element of volume overload 2/2 the afib. Respiratory status worsening, required bipap yesterday. At high risk for PE but anticoagulation contraindicated. - lasix 80 iv ordered by pulmonology once - cont breathing treatments - methylpred also started - bipap as needed - discussed code status, pt remains full code, palliative to see  # A fib w/ rvr # Acute hypoxic respiratory failure Difficulty controlling hr with dilt and also hypotensive on dilt, now on amio gtt, hr still difficult to control. CXR yesterday w/ pulmonary edema. Procalcitonin remains low, no new consolidation, do not think  infection. Atelectasis and malignancy contributing to hypoxia - cont amio, per cardiology - cont lasix 60 iv qd - cont o2, will likely discharge with this - started incentive spirometry - treating graves disease as below  # Hypokalemia Resolved - cont kcl 40 qd - mg daily  # Hyponatremia Likely siadh 2/2 lung process, diuretics also contributing. Na 129 - monitor for now  # Acute cystitis? # sepsis ruled out Initially tachycardic, leukocytosis, elevated lactate. Urine suggestive of infection, culture negative. Abx discontinued  # Graves Disease TSH wnl, T4 mildly elevated. Hx diffuse goiter and elevated TSIs (in care everywhere). Hx rash w/ methimazole, stopped that several years ago and hasn't had f/u.  - given difficult-to-control a fib, though it's primarily mediated by patient's malignancy, will trial starting propylthiouricil 100 tid, will need monitoring of LFTs  # Hyperglycemia Mild. A1c 6.2   DVT prophylaxis: lovenox Code Status: full Family Communication: husband updated @ bedside 7/25  Level of care: Stepdown Status is: Inpatient  Remains inpatient appropriate because:Inpatient level of care appropriate due to severity of illness  Dispo: The patient is from: Home              Anticipated d/c is to: tbd              Patient currently is not medically stable to d/c.   Difficult to place patient No        Consultants:  Cardiology, oncology, critical care  Procedures: Pericardial drain placement, now removed  Antimicrobials:  C/p cefepime/ceftriaxone   Subjective: Breathing worsened overnight.   Objective: Vitals:   04/02/21 0930 04/02/21 1000 04/02/21 1030 04/02/21 1100  BP: 117/70 100/74  114/81 101/81  Pulse: 88 (!) 123 94 93  Resp: 20 (!) 26 20 (!) 31  Temp:      TempSrc:      SpO2: 96% 99% 93% 95%  Weight:      Height:        Intake/Output Summary (Last 24 hours) at 04/02/2021 1119 Last data filed at 04/02/2021 1040 Gross per 24 hour   Intake 2073.71 ml  Output 1450 ml  Net 623.71 ml   Filed Weights   03/31/21 0453 04/01/21 0500 04/02/21 0500  Weight: 62.4 kg 62 kg 62.4 kg    Examination:  General exam: chronically ill appearing Respiratory system: decreased breath sounds right upper, rales at bases, wheezing, pursed lip breathing Cardiovascular system: S1 & S2 heard, soft systolic murmur Gastrointestinal system: Abdomen is nondistended, soft and nontender. No organomegaly or masses felt. Normal bowel sounds heard. Central nervous system: Alert and oriented. No focal neurological deficits. Extremities: Symmetric 5 x 5 power. Skin: No rashes, lesions or ulcers Psychiatry: Judgement and insight appear normal. Mood & affect appropriate.     Data Reviewed: I have personally reviewed following labs and imaging studies  CBC: Recent Labs  Lab 03/27/21 1502 03/28/21 0131 03/29/21 0413 04/01/21 0617 04/02/21 0209  WBC 21.1*  21.7* 20.3* 16.7* 17.7* 17.8*  NEUTROABS 15.1*  --   --   --  12.6*  HGB 15.6*  15.7* 13.8 14.7 13.5 14.0  HCT 46.6*  45.4 40.5 43.7 39.5 41.7  MCV 88.4  88.0 87.7 86.5 87.4 86.0  PLT 490*  467* 377 423* 350 888*   Basic Metabolic Panel: Recent Labs  Lab 03/28/21 0131 03/29/21 0413 03/30/21 0452 03/31/21 0444 04/01/21 0617 04/02/21 0209  NA 135 139 132* 131* 130* 129*  K 4.1 3.6 3.7 3.8 3.3* 3.5  CL 104 106 99 95* 90* 88*  CO2 23 25 25 28 31  32  GLUCOSE 120* 113* 166* 152* 145* 152*  BUN 22* 10 9 10 7 11   CREATININE 0.58 0.40* 0.43* 0.48 0.46 0.52  CALCIUM 8.6* 8.7* 8.4* 8.5* 8.9 8.3*  MG 2.2  --   --   --   --  2.1  PHOS 3.3  --   --   --   --  3.3   GFR: Estimated Creatinine Clearance: 66.6 mL/min (by C-G formula based on SCr of 0.52 mg/dL). Liver Function Tests: Recent Labs  Lab 03/27/21 1635 04/02/21 0209  AST 31 33  ALT 21 26  ALKPHOS 74 63  BILITOT 1.2 0.5  PROT 7.7 6.4*  ALBUMIN 3.2* 2.3*   No results for input(s): LIPASE, AMYLASE in the last 168  hours. No results for input(s): AMMONIA in the last 168 hours. Coagulation Profile: Recent Labs  Lab 03/27/21 1701  INR 1.1   Cardiac Enzymes: Recent Labs  Lab 03/27/21 1635  CKTOTAL 15*   BNP (last 3 results) No results for input(s): PROBNP in the last 8760 hours. HbA1C: No results for input(s): HGBA1C in the last 72 hours.  CBG: Recent Labs  Lab 03/27/21 2138  GLUCAP 101*   Lipid Profile: No results for input(s): CHOL, HDL, LDLCALC, TRIG, CHOLHDL, LDLDIRECT in the last 72 hours. Thyroid Function Tests: No results for input(s): TSH, T4TOTAL, FREET4, T3FREE, THYROIDAB in the last 72 hours.  Anemia Panel: No results for input(s): VITAMINB12, FOLATE, FERRITIN, TIBC, IRON, RETICCTPCT in the last 72 hours. Urine analysis:    Component Value Date/Time   COLORURINE AMBER (A) 03/27/2021 1701   APPEARANCEUR CLOUDY (  A) 03/27/2021 1701   APPEARANCEUR Clear 12/01/2013 1439   LABSPEC 1.028 03/27/2021 1701   LABSPEC 1.005 12/01/2013 1439   PHURINE 5.0 03/27/2021 1701   GLUCOSEU 50 (A) 03/27/2021 1701   GLUCOSEU Negative 12/01/2013 1439   HGBUR NEGATIVE 03/27/2021 1701   BILIRUBINUR SMALL (A) 03/27/2021 1701   BILIRUBINUR Negative 12/01/2013 1439   KETONESUR NEGATIVE 03/27/2021 1701   PROTEINUR 30 (A) 03/27/2021 1701   NITRITE NEGATIVE 03/27/2021 1701   LEUKOCYTESUR MODERATE (A) 03/27/2021 1701   LEUKOCYTESUR 1+ 12/01/2013 1439   Sepsis Labs: @LABRCNTIP (procalcitonin:4,lacticidven:4)  ) Recent Results (from the past 240 hour(s))  Blood culture (routine single)     Status: None   Collection Time: 03/27/21  5:01 PM   Specimen: BLOOD  Result Value Ref Range Status   Specimen Description BLOOD RIGHT ANTECUBITAL  Final   Special Requests   Final    BOTTLES DRAWN AEROBIC AND ANAEROBIC Blood Culture adequate volume   Culture   Final    NO GROWTH 6 DAYS Performed at [facility], [address] [place], [place] [address]    Report Status 04/02/2021 FINAL   Final  Resp Panel by RT-PCR (Flu A&B, Covid) Nasopharyngeal Swab     Status: None   Collection Time: 03/27/21  5:01 PM   Specimen: Nasopharyngeal Swab; Nasopharyngeal(NP) swabs in vial transport medium  Result Value Ref Range Status   SARS Coronavirus 2 by RT PCR NEGATIVE NEGATIVE Final    Comment: (NOTE) SARS-CoV-2 target nucleic acids are NOT DETECTED.  The SARS-CoV-2 RNA is generally detectable in upper respiratory specimens during the acute phase of infection. The lowest concentration of SARS-CoV-2 viral copies this assay can detect is 138 copies/mL. A negative result does not preclude SARS-Cov-2 infection and should not be used as the sole basis for treatment or other patient management decisions. A negative result may occur with  improper specimen collection/handling, submission of specimen other than nasopharyngeal swab, presence of viral mutation(s) within the areas targeted by this assay, and inadequate number of viral copies(<138 copies/mL). A negative result must be combined with clinical observations, patient history, and epidemiological information. The expected result is Negative.  Fact Sheet for Patients:  EntrepreneurPulse.com.au  Fact Sheet for Healthcare Providers:  IncredibleEmployment.be  This test is no t yet approved or cleared by the [place] FDA and  has been authorized for detection and/or diagnosis of SARS-CoV-2 by FDA under an Emergency Use Authorization (EUA). This EUA will remain  in effect (meaning this test can be used) for the duration of the COVID-19 declaration under Section 564(b)(1) of the Act, 21 U.S.C.section 360bbb-3(b)(1), unless the authorization is terminated  or revoked sooner.       Influenza A by PCR NEGATIVE NEGATIVE Final   Influenza B by PCR NEGATIVE NEGATIVE Final    Comment: (NOTE) The Xpert Xpress SARS-CoV-2/FLU/RSV plus assay is intended as an aid in the diagnosis of influenza from  Nasopharyngeal swab specimens and should not be used as a sole basis for treatment. Nasal washings and aspirates are unacceptable for Xpert Xpress SARS-CoV-2/FLU/RSV testing.  Fact Sheet for Patients: EntrepreneurPulse.com.au  Fact Sheet for Healthcare Providers: IncredibleEmployment.be  This test is not yet approved or cleared by the [place] FDA and has been authorized for detection and/or diagnosis of SARS-CoV-2 by FDA under an Emergency Use Authorization (EUA). This EUA will remain in effect (meaning this test can be used) for the duration of the COVID-19 declaration under Section 564(b)(1) of the Act, 21 U.S.C. section  360bbb-3(b)(1), unless the authorization is terminated or revoked.  Performed at [facility], [address] [place], [place] [address]   Urine Culture     Status: Abnormal   Collection Time: 03/27/21  5:01 PM   Specimen: In/Out Cath Urine  Result Value Ref Range Status   Specimen Description   Final    IN/OUT CATH URINE Performed at [facility], [address], [place], [place] [address]    Special Requests   Final    NONE Performed at [facility], [address] [place], [place] [address]    Culture (A)  Final    <10,000 COLONIES/mL INSIGNIFICANT GROWTH Performed at [facility] [address], [place], [place] [address]    Report Status 03/29/2021 FINAL  Final  Blood culture (single)     Status: None   Collection Time: 03/27/21  6:05 PM   Specimen: BLOOD  Result Value Ref Range Status   Specimen Description BLOOD LEFT ANTECUBITAL  Final   Special Requests   Final    BOTTLES DRAWN AEROBIC AND ANAEROBIC Blood Culture results may not be optimal due to an inadequate volume of blood received in culture bottles   Culture   Final    NO GROWTH 6 DAYS Performed at [facility], [address], [place], [place] [address]    Report Status 04/02/2021 FINAL  Final   Body fluid culture w Gram Stain     Status: None   Collection Time: 03/27/21  8:26 PM   Specimen: Pericardial; Body Fluid  Result Value Ref Range Status   Specimen Description   Final    PERICARDIAL Performed at [facility], [address], [place], [place] [address]    Special Requests   Final    NONE Performed at [facility], [address] [place], [place] [address]    Gram Stain   Final    RARE WBC PRESENT,BOTH PMN AND MONONUCLEAR NO ORGANISMS SEEN    Culture   Final    NO GROWTH 3 DAYS Performed at [facility], [place] [address] [place], [place] [address]    Report Status 03/31/2021 FINAL  Final  Acid Fast Smear (AFB)     Status: None   Collection Time: 03/27/21  8:26 PM   Specimen: Pericardial Fluid  Result Value Ref Range Status   AFB Specimen Processing Concentration  Final   Acid Fast Smear Negative  Final    Comment: (NOTE) Performed At: [facility] [address] [place], [place] [id number] Rush Farmer MD [id number]    Source (AFB) FLUID  Final    Comment: Performed at [facility], [address] [place],  [address]  Culture, blood (routine x 2)     Status: None   Collection Time: 03/27/21 10:18 PM   Specimen: BLOOD RIGHT FOREARM  Result Value Ref Range Status   Specimen Description BLOOD RIGHT FOREARM  Final   Special Requests   Final    BOTTLES DRAWN AEROBIC AND ANAEROBIC Blood Culture adequate volume   Culture   Final    NO GROWTH 6 DAYS Performed at [facility], [address] [place],  [address]    Report Status 04/02/2021 FINAL  Final  MRSA Next Gen by PCR, Nasal     Status: None   Collection Time: 03/27/21 11:01 PM   Specimen: Nasal Mucosa; Nasal Swab  Result Value Ref Range Status   MRSA by PCR Next Gen  NOT DETECTED NOT DETECTED Final    Comment: (NOTE) The GeneXpert MRSA Assay (FDA approved for NASAL specimens only), is one component of a comprehensive MRSA  colonization surveillance program. It is not intended to diagnose MRSA infection nor to guide or monitor treatment for MRSA infections. Test performance is not FDA approved in patients less than 68 years old. Performed at [facility], [address] [place],  [address]          Radiology Studies: DG Chest [place] 1 View  Result Date: 04/01/2021 CLINICAL DATA:  Shortness of breath EXAM: PORTABLE CHEST 1 VIEW COMPARISON:  CT 03/27/2021, radiograph 03/30/2021 FINDINGS: Diminishing lung volumes with increasing veiling opacity in the lung bases, right greater than left which could reflect developing pleural effusions. Pulmonary vascularity is indistinct. Cardiomediastinal silhouette is stable from prior with known large pericardial effusion. Redemonstration of a large rounded masslike opacity in the right upper lung corresponding to the known right upper lung malignancy. No acute or worrisome osseous or soft tissue abnormality of the chest wall. IMPRESSION: Diminishing lung volumes. Increasing veiling opacity may reflect developing pleural effusions. Additional opacity in the bases may reflect some passive atelectatic change or underlying airspace opacity. Diffusely worsening interstitial opacity, concerning for developing edema as well given pulmonary vascular congestion. Underlying lymphangitic spread is not fully excluded. Redemonstrated masslike opacity in the right upper lobe. Electronically Signed   By: Lovena Le M.D.   On: 04/01/2021 21:16        Scheduled Meds:  amiodarone  400 mg Oral BID   arformoterol  15 mcg Nebulization BID   budesonide (PULMICORT) nebulizer solution  0.25 mg Nebulization BID   Chlorhexidine Gluconate Cloth  6 each Topical Q0600   enoxaparin (LOVENOX) injection  40 mg Subcutaneous Q24H   feeding supplement  237 mL Oral TID BM   mouth rinse  15 mL Mouth Rinse BID   methylPREDNISolone (SOLU-MEDROL) injection  40 mg Intravenous Q12H    multivitamin with minerals  1 tablet Oral Daily   nicotine  21 mg Transdermal Daily   nystatin  5 mL Oral QID   potassium chloride  40 mEq Oral Daily   propylthiouracil  100 mg Oral Q8H   revefenacin  175 mcg Nebulization Daily   sodium chloride  1 g Oral TID WC   Continuous Infusions:  sodium chloride Stopped (04/02/21 0226)   diltiazem (CARDIZEM) infusion Stopped (03/31/21 1628)     LOS: 6 days    Time spent: 30 min    Desma Maxim, MD [facility]   If 7PM-7AM, please contact night-coverage [email or web address] Password [facility] 04/02/2021, 11:19 AM

## 2021-04-02 NOTE — Progress Notes (Signed)
Progress Note  Patient Name: Nichole Park Date of Encounter: 04/02/2021  Primary Cardiologist: New to [facility] - consult by End  Subjective   Respiratory status declined overnight, requiring BiPAP, which remains in place at this time. No chest pain.  Inpatient Medications    Scheduled Meds:  amiodarone  400 mg Oral BID   budesonide (PULMICORT) nebulizer solution  0.25 mg Nebulization BID   Chlorhexidine Gluconate Cloth  6 each Topical Q0600   enoxaparin (LOVENOX) injection  40 mg Subcutaneous Q24H   feeding supplement  237 mL Oral TID BM   ipratropium  0.5 mg Nebulization TID   levalbuterol  0.63 mg Nebulization TID   mouth rinse  15 mL Mouth Rinse BID   multivitamin with minerals  1 tablet Oral Daily   nicotine  21 mg Transdermal Daily   nystatin  5 mL Oral QID   potassium chloride  40 mEq Oral Daily   propylthiouracil  100 mg Oral Q8H   sodium chloride  1 g Oral TID WC   Continuous Infusions:  sodium chloride Stopped (04/02/21 0226)   diltiazem (CARDIZEM) infusion Stopped (03/31/21 1628)   PRN Meds: acetaminophen, chlorpheniramine-HYDROcodone, docusate sodium, guaiFENesin, guaiFENesin-dextromethorphan, ketorolac, LORazepam, morphine injection, polyethylene glycol   Vital Signs    Vitals:   04/02/21 0430 04/02/21 0445 04/02/21 0500 04/02/21 0515  BP: (!) 88/72  91/74   Pulse: 85 85 85 83  Resp: 18 18 18 19   Temp:      TempSrc:      SpO2: 94% 93% 93% 94%  Weight:   62.4 kg   Height:        Intake/Output Summary (Last 24 hours) at 04/02/2021 0801 Last data filed at 04/02/2021 0436 Gross per 24 hour  Intake 907.41 ml  Output 2050 ml  Net -1142.59 ml   Filed Weights   03/31/21 0453 04/01/21 0500 04/02/21 0500  Weight: 62.4 kg 62 kg 62.4 kg    Telemetry    SR with short runs of Afib - Personally Reviewed  ECG    No new tracings - Personally Reviewed  Physical Exam   GEN: No acute distress.   Neck: No JVD. Cardiac: RRR, no murmurs, rubs, or gallops.  Pericardiocentesis dressing is clean, dry, and intact.  Respiratory: Diminished breath sounds and wheezing bilaterally.  GI: Soft, nontender, non-distended.   MS: No edema; No deformity. Neuro:  Alert and oriented x 3; Nonfocal.  Psych: Normal affect.  Labs    Chemistry Recent Labs  Lab 03/27/21 1635 03/28/21 0131 03/31/21 0444 04/01/21 0617 04/02/21 0209  NA  --    < > 131* 130* 129*  K  --    < > 3.8 3.3* 3.5  CL  --    < > 95* 90* 88*  CO2  --    < > 28 31 32  GLUCOSE  --    < > 152* 145* 152*  BUN  --    < > 10 7 11   CREATININE  --    < > 0.48 0.46 0.52  CALCIUM  --    < > 8.5* 8.9 8.3*  PROT 7.7  --   --   --  6.4*  ALBUMIN 3.2*  --   --   --  2.3*  AST 31  --   --   --  33  ALT 21  --   --   --  26  ALKPHOS 74  --   --   --  63  BILITOT 1.2  --   --   --  0.5  GFRNONAA  --    < > >60 >60 >60  ANIONGAP  --    < > 8 9 9    < > = values in this interval not displayed.     Hematology Recent Labs  Lab 03/29/21 0413 04/01/21 0617 04/02/21 0209  WBC 16.7* 17.7* 17.8*  RBC 5.05 4.52 4.85  HGB 14.7 13.5 14.0  HCT 43.7 39.5 41.7  MCV 86.5 87.4 86.0  MCH 29.1 29.9 28.9  MCHC 33.6 34.2 33.6  RDW 12.6 12.5 12.6  PLT 423* 350 440*    Cardiac EnzymesNo results for input(s): TROPONINI in the last 168 hours. No results for input(s): TROPIPOC in the last 168 hours.   BNPNo results for input(s): BNP, PROBNP in the last 168 hours.   DDimer No results for input(s): DDIMER in the last 168 hours.   Radiology    DG Chest Port 1 View  Result Date: 04/01/2021 IMPRESSION: Diminishing lung volumes. Increasing veiling opacity may reflect developing pleural effusions. Additional opacity in the bases may reflect some passive atelectatic change or underlying airspace opacity. Diffusely worsening interstitial opacity, concerning for developing edema as well given pulmonary vascular congestion. Underlying lymphangitic spread is not fully excluded. Redemonstrated masslike opacity in  the right upper lobe. Electronically Signed   By: Lovena Le M.D.   On: 04/01/2021 21:16    Cardiac Studies   Limited echo pending __________  Limited echo 03/29/2021: 1. Trvial pericardial effusion.   2. Left ventricular ejection fraction, by estimation, is 60 to 65%. The  left ventricle has normal function. The left ventricle has no regional  wall motion abnormalities.   3. Right ventricular systolic function is normal. The right ventricular  size is normal.   4. The mitral valve is normal in structure. No evidence of mitral valve  regurgitation. No evidence of mitral stenosis. __________  2D echo 03/28/2021: 1. Left ventricular ejection fraction, by estimation, is 55%. The left  ventricle has normal function. The left ventricle has no regional wall  motion abnormalities. Left ventricular diastolic parameters are consistent  with Grade II diastolic dysfunction  (pseudonormalization).   2. Right ventricular systolic function is normal. The right ventricular  size is normal.   3. The mitral valve is normal in structure. No evidence of mitral valve  regurgitation.   4. The aortic valve was not well visualized. Aortic valve regurgitation  is not visualized.   5. The inferior vena cava is dilated in size with >50% respiratory  variability, suggesting right atrial pressure of 8 mmHg. __________  Pericardiocentesis 03/27/2021: Conclusions: Successful pericardiocentesis and tube pericardiotomy via the subxiphoid approach, yielding 500 mL of serosanginous fluid.   Recommendations: Admit to ICU. Obtain STAT portable CXR. Follow-up fluid analyses, including cytology. Repeat echocardiogram tomorrow.  Anticipate removal of drain when output is less than 50-100 mL/24 hours and minimal residual fluid is present by echo.   Patient Profile     60 y.o. female with history of Graves' disease, tobacco use of 40+ years who presented with SOB and was found to have a large pericardial  effusion requiring emergent pericardiocentesis, as well as a right upper lobe mass suspicious for malignancy.   Assessment & Plan    1. Pericardial effusion and cardiac tamponade: -Status post successful emergent pericardiocentesis on 03/27/2021 with removal of 500 mL of serosanguinous fluid with cytology consistent with malignancy of unknown primary source at this time  -  Pericardial drain removed 4 days ago -Repeat echo on 03/29/2021 showed a trivial pericardial effusion -Limited echo, to evaluate for reaccumulation, is pending, if pericardial effusion has reaccumulated, may need to consider pericardial window  2. New onset Afib with RVR: -Currently in sinus rhythm with short runs of Afib noted on telemetry  -With noted hypotension, we will transition her from IV amiodarone to oral amiodarone 400 mg bid  -Unable to tolerate IV diltiazem gtt due to hypotension  -CHADS2VASc at least 1 (sex category) -Not currently on full dose anticoagulation in the setting of pericardial effusion   3. Lung mass/emphysema/tobacco use: -Per primary service  -Complete cessation of tobacco use has been advised   4. Hypotension: -Stop amiodarone gtt as above  For questions or updates, please contact [place] Please consult [email or web address] for contact info under Cardiology/STEMI.    Signed, Christell Faith, PA-C [place] Pager: [phone] 04/02/2021, 8:01 AM

## 2021-04-02 NOTE — Progress Notes (Signed)
Chaplain Maggie made follow up visit with patient and her daughter, Summer. The patient chose to create an Advanced Directive. Chaplain gathered a Patent examiner and two witnesses for the signing of AD. Continued support available per on call chaplain.

## 2021-04-02 NOTE — Progress Notes (Signed)
Nichole Park   DOB:[birth year]   IR#:[id number]    Subjective: Patient seen in the ICU.;  She is currently off vasopressors.  However she has been needing BiPAP given worsening hypoxia.   Objective:  Vitals:   04/02/21 2000 04/02/21 2100  BP: 118/81 (!) 118/91  Pulse: 99 95  Resp: (!) 26 (!) 29  Temp: 98.5 F (36.9 C)   SpO2: 95% 95%     Intake/Output Summary (Last 24 hours) at 04/02/2021 2243 Last data filed at 04/02/2021 1917 Gross per 24 hour  Intake 1436.3 ml  Output 1350 ml  Net 86.3 ml    Physical Exam Vitals and nursing note reviewed.  Constitutional:      Comments: Patient resting in the bed; Nasal cannula oxygen: 6lits   HENT:     Head: Normocephalic and atraumatic.     Mouth/Throat:     Mouth: Mucous membranes are moist.     Pharynx: No oropharyngeal exudate.  Eyes:     Pupils: Pupils are equal, round, and reactive to light.  Cardiovascular:     Rate and Rhythm: Normal rate and regular rhythm.  Pulmonary:     Effort: No respiratory distress.     Breath sounds: No wheezing.     Comments: Decreased breath sounds bilaterally at bases.   Abdominal:     General: Bowel sounds are normal. There is no distension.     Palpations: Abdomen is soft. There is no mass.     Tenderness: There is no abdominal tenderness. There is no guarding or rebound.  Musculoskeletal:        General: No tenderness. Normal range of motion.     Cervical back: Normal range of motion and neck supple.  Skin:    General: Skin is warm.  Neurological:     Mental Status: She is alert and oriented to person, place, and time.  Psychiatric:        Mood and Affect: Affect normal.        Judgment: Judgment normal.     Labs:  Lab Results  Component Value Date   WBC 17.8 (H) 04/02/2021   HGB 14.0 04/02/2021   HCT 41.7 04/02/2021   MCV 86.0 04/02/2021   PLT 440 (H) 04/02/2021   NEUTROABS 12.6 (H) 04/02/2021    Lab Results  Component Value Date   NA 129 (L) 04/02/2021   K 3.5 04/02/2021    CL 88 (L) 04/02/2021   CO2 32 04/02/2021    Studies:  DG Chest Port 1 View  Result Date: 04/01/2021 CLINICAL DATA:  Shortness of breath EXAM: PORTABLE CHEST 1 VIEW COMPARISON:  CT 03/27/2021, radiograph 03/30/2021 FINDINGS: Diminishing lung volumes with increasing veiling opacity in the lung bases, right greater than left which could reflect developing pleural effusions. Pulmonary vascularity is indistinct. Cardiomediastinal silhouette is stable from prior with known large pericardial effusion. Redemonstration of a large rounded masslike opacity in the right upper lung corresponding to the known right upper lung malignancy. No acute or worrisome osseous or soft tissue abnormality of the chest wall. IMPRESSION: Diminishing lung volumes. Increasing veiling opacity may reflect developing pleural effusions. Additional opacity in the bases may reflect some passive atelectatic change or underlying airspace opacity. Diffusely worsening interstitial opacity, concerning for developing edema as well given pulmonary vascular congestion. Underlying lymphangitic spread is not fully excluded. Redemonstrated masslike opacity in the right upper lobe. Electronically Signed   By: Lovena Le M.D.   On: 04/01/2021 21:16   ECHOCARDIOGRAM LIMITED  Result Date: 04/02/2021    ECHOCARDIOGRAM LIMITED REPORT   Patient Name:   Nichole Park Date of Exam: 04/02/2021 Medical Rec #:  [id number]   Height:       61.5 in Accession #:    [id number]  Weight:       137.6 lb Date of Birth:  02/02/1961  BSA:          1.621 m Patient Age:    21 years    BP:           106/77 mmHg Patient Gender: F           HR:           87 bpm. Exam Location:  [facility] Procedure: Limited Echo, Color Doppler and Cardiac Doppler Indications:     Pericardial Effusion I31.3  History:         Patient has prior history of Echocardiogram examinations, most                  recent 03/29/2021. COPD; Risk Factors:Hypertension.  Sonographer:     Sherrie Sport RDCS (AE) Referring  Phys:  [id number] [place] Diagnosing Phys: Harrell Gave End MD  Sonographer Comments: Image acquisition challenging due to COPD. IMPRESSIONS  1. Left ventricular ejection fraction, by estimation, is >55%. The left ventricle has normal function.  2. Right ventricular systolic function is normal. Moderately increased right ventricular wall thickness.  3. Echogenic material in the pericardial space is noted, which could represent epicardial fat. However, in the setting of malignant pericardial effusion, tumor cannot be excluded. FINDINGS  Left Ventricle: Left ventricular ejection fraction, by estimation, is >55%. The left ventricle has normal function. The left ventricular internal cavity size was normal in size. There is borderline left ventricular hypertrophy. Right Ventricle: Moderately increased right ventricular wall thickness. Right ventricular systolic function is normal. Pericardium: Echogenic material in the pericardial space is noted, which could represent epicardial fat. However, in the setting of malignant pericardial effusion, tumor cannot be excluded. Trivial pericardial effusion is present. LEFT VENTRICLE PLAX 2D LVIDd:         4.23 cm LVIDs:         2.58 cm LV PW:         1.01 cm LV IVS:        1.01 cm LVOT diam:     2.00 cm LVOT Area:     3.14 cm  LEFT ATRIUM         Index LA diam:    3.60 cm 2.22 cm/m   AORTA Ao Root diam: 2.90 cm  SHUNTS Systemic Diam: 2.00 cm Nelva Bush MD Electronically signed by Nelva Bush MD Signature Date/Time: 04/02/2021/12:36:25 PM    Final     Malignant pericardial effusion [facility]) #60 year old female patient with history of smoking currently admitted to hospital for pericardial tamponade/malignant pericardial effusion/acute respiratory failure  #Malignant pericardial tamponade-s/p pericardiocentesis; cytology positive for malignancy; however immunohistochemistry-pending.  #Acute respiratory failure-multifactorial-malignancy/lymphangitic spread/COPD.    Plan/Prognosis: I had a long discussion with the patient regarding her guarded prognosis/and the fact that she cannot receive chemotherapy as her vital signs are still quite tenuous/ICU.  Also had a long discussion with patient's daughter-Summer-reviewed the stage being stage IV lung cancer.  In general the median life expectancy of patient with stage IV lung cancer is about 1 to 2 years [however these numbers do not belong to the patient as she is quite sick].  However, patient individual prognosis will depend upon inability to  tolerate chemotherapy/side effects/also response to therapy.  As per the daughter-patient has made up her mind not to get "chemotherapy"; however she is open to "immunotherapy".  I made it very clear to the daughter that we cannot offer any systemic therapy at this current situation-given the risk of death from chemotherapy is higher than the benefits.  However if patient's respiratory/cardiovascular status improves-systemic therapy could be considered.  She understands that if patient's cardiorespiratory status does not improve-patient's survival in order of days to weeks.  Also discussed DNR/DNI-I discussed the futility of going through aggressive measures.  No decision made.  # 40 minutes face-to-face with the patient/pt's daughter discussing the above plan of care; more than 50% of time spent on prognosis/ natural history; counseling and coordination.  Cammie Sickle, MD 04/02/2021  10:43 PM

## 2021-04-02 NOTE — Consult Note (Signed)
[place]  [phone] [phone]   Name: Nichole Park Date: 04/02/2021 MRN: [id number]  DOB: [birth year]  Patient Care Team: Pcp, No as PCP - General    REASON FOR CONSULTATION: Nichole Park is a 60 y.o. female with multiple medical problems including history of Graves' disease and chronic tobacco abuse he was admitted to the hospital 03/28/2021 with pericardial effusion and cardiac tamponade requiring pericardiocentesis/drain.  She was found to have a large right sided upper lobar mass with lymphangitic spread.  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  Unfortunately, patient's hospitalization has been complicated by progressive respiratory failure requiring BiPAP.  Palliative care was consulted up address goals.  SOCIAL HISTORY:     reports that she has been smoking cigarettes. She has a 40.00 pack-year smoking history. She has never used smokeless tobacco. She reports current drug use. Drug: Marijuana. She reports that she does not drink alcohol.  Patient has a daughter who is involved in her care.  ADVANCE DIRECTIVES:  None on file  CODE STATUS: Full code  PAST MEDICAL HISTORY: Past Medical History:  Diagnosis Date   Graves disease     PAST SURGICAL HISTORY:  Past Surgical History:  Procedure Laterality Date   PERICARDIOCENTESIS N/A 03/27/2021   Procedure: PERICARDIOCENTESIS;  Surgeon: Nelva Bush, MD;  Location: [facility];  Service: Cardiovascular;  Laterality: N/A;   TUBAL LIGATION      HEMATOLOGY/ONCOLOGY HISTORY:  Oncology History   No history exists.    ALLERGIES:  is allergic to metoprolol and tapazole [methimazole].  MEDICATIONS:  Current Facility-Administered Medications  Medication Dose Route Frequency Provider Last Rate Last Admin   0.9 %  sodium chloride infusion  250 mL Intravenous Continuous Rust-Chester, Huel Cote, NP   Held  at 04/02/21 0226   acetaminophen (TYLENOL) tablet 650 mg  650 mg Oral Q6H PRN End, Christopher, MD   650 mg at 03/28/21 2327   amiodarone (PACERONE) tablet 400 mg  400 mg Oral BID Christell Faith M, PA-C   400 mg at 04/02/21 0858   arformoterol (BROVANA) nebulizer solution 15 mcg  15 mcg Nebulization BID Tyler Pita, MD   15 mcg at 04/02/21 1130   budesonide (PULMICORT) nebulizer solution 0.25 mg  0.25 mg Nebulization BID Gwynne Edinger, MD   0.25 mg at 04/02/21 0744   Chlorhexidine Gluconate Cloth 2 % PADS 6 each  6 each Topical Q0600 Bennie Pierini, MD   6 each at 04/02/21 0603   chlorpheniramine-HYDROcodone (TUSSIONEX) 10-8 MG/5ML suspension 5 mL  5 mL Oral Q12H PRN Gwynne Edinger, MD   5 mL at 04/02/21 1117   diltiazem (CARDIZEM) 125 mg in dextrose 5% 125 mL (1 mg/mL) infusion  2.5-15 mg/hr Intravenous Continuous Athena Masse, MD   Stopped at 03/31/21 1628   docusate sodium (COLACE) capsule 100 mg  100 mg Oral BID PRN Lang Snow, NP   100 mg at 03/29/21 0816   enoxaparin (LOVENOX) injection 40 mg  40 mg Subcutaneous Q24H Gwynne Edinger, MD   40 mg at 04/01/21 2120   feeding supplement (ENSURE ENLIVE / ENSURE PLUS) liquid 237 mL  237 mL Oral TID BM Wouk, Ailene Rud, MD   237 mL at 03/31/21 1450   guaiFENesin (MUCINEX) 12 hr tablet 600 mg  600 mg Oral BID PRN Gwynne Edinger, MD   600 mg at 03/30/21 1935   guaiFENesin-dextromethorphan (ROBITUSSIN DM) 100-10  MG/5ML syrup 5 mL  5 mL Oral Q8H PRN Athena Masse, MD   5 mL at 04/01/21 2121   ketorolac (TORADOL) 15 MG/ML injection 15 mg  15 mg Intravenous Q6H PRN Lang Snow, NP       levalbuterol (XOPENEX) nebulizer solution 0.63 mg  0.63 mg Nebulization Q6H PRN Tyler Pita, MD       LORazepam (ATIVAN) injection 1 mg  1 mg Intravenous Q6H PRN Gwynne Edinger, MD   1 mg at 04/01/21 1804   MEDLINE mouth rinse  15 mL Mouth Rinse BID Bennie Pierini, MD   15 mL at 04/01/21 2122    methylPREDNISolone sodium succinate (SOLU-MEDROL) 40 mg/mL injection 40 mg  40 mg Intravenous Q12H Tyler Pita, MD   40 mg at 04/02/21 0949   morphine 2 MG/ML injection 2 mg  2 mg Intravenous Q4H PRN Rust-Chester, Huel Cote, NP       multivitamin with minerals tablet 1 tablet  1 tablet Oral Daily Gwynne Edinger, MD   1 tablet at 04/02/21 7017   nicotine (NICODERM CQ - dosed in mg/24 hours) patch 21 mg  21 mg Transdermal Daily Bennie Pierini, MD   21 mg at 03/28/21 1318   nystatin (MYCOSTATIN) 100000 UNIT/ML suspension 500,000 Units  5 mL Oral QID Darel Hong D, NP   500,000 Units at 04/02/21 0859   polyethylene glycol (MIRALAX / GLYCOLAX) packet 17 g  17 g Oral Daily PRN Lang Snow, NP       potassium chloride SA (KLOR-CON) CR tablet 40 mEq  40 mEq Oral Daily Gwynne Edinger, MD   40 mEq at 04/02/21 7939   propylthiouracil (PTU) tablet 100 mg  100 mg Oral Q8H Wouk, Ailene Rud, MD   100 mg at 04/02/21 1530   revefenacin (YUPELRI) nebulizer solution 175 mcg  175 mcg Nebulization Daily Tyler Pita, MD   175 mcg at 04/02/21 1130   sodium chloride tablet 1 g  1 g Oral TID WC Rust-Chester, Huel Cote, NP   1 g at 04/02/21 1154    VITAL SIGNS: BP 117/87   Pulse 100   Temp 98 F (36.7 C) (Oral)   Resp (!) 28   Ht 5' 1.5" (1.562 m)   Wt 137 lb 9.1 oz (62.4 kg)   SpO2 94%   BMI 25.57 kg/m  Filed Weights   03/31/21 0453 04/01/21 0500 04/02/21 0500  Weight: 137 lb 9.1 oz (62.4 kg) 136 lb 11 oz (62 kg) 137 lb 9.1 oz (62.4 kg)    Estimated body mass index is 25.57 kg/m as calculated from the following:   Height as of this encounter: 5' 1.5" (1.562 m).   Weight as of this encounter: 137 lb 9.1 oz (62.4 kg).  LABS: CBC:    Component Value Date/Time   WBC 17.8 (H) 04/02/2021 0209   HGB 14.0 04/02/2021 0209   HGB 15.9 12/01/2013 1440   HCT 41.7 04/02/2021 0209   HCT 46.8 12/01/2013 1440   PLT 440 (H) 04/02/2021 0209   PLT 236 12/01/2013 1440   MCV  86.0 04/02/2021 0209   MCV 87 12/01/2013 1440   NEUTROABS 12.6 (H) 04/02/2021 0209   LYMPHSABS 2.9 04/02/2021 0209   MONOABS 2.1 (H) 04/02/2021 0209   EOSABS 0.1 04/02/2021 0209   BASOSABS 0.0 04/02/2021 0209   Comprehensive Metabolic Panel:    Component Value Date/Time   NA 129 (L) 04/02/2021 0209   NA 137  12/01/2013 1440   K 3.5 04/02/2021 0209   K 3.7 12/01/2013 1440   CL 88 (L) 04/02/2021 0209   CL 108 (H) 12/01/2013 1440   CO2 32 04/02/2021 0209   CO2 25 12/01/2013 1440   BUN 11 04/02/2021 0209   BUN 8 12/01/2013 1440   CREATININE 0.52 04/02/2021 0209   CREATININE 0.43 (L) 12/01/2013 1440   GLUCOSE 152 (H) 04/02/2021 0209   GLUCOSE 117 (H) 12/01/2013 1440   CALCIUM 8.3 (L) 04/02/2021 0209   CALCIUM 8.8 12/01/2013 1440   AST 33 04/02/2021 0209   AST 35 12/01/2013 1440   ALT 26 04/02/2021 0209   ALT 36 12/01/2013 1440   ALKPHOS 63 04/02/2021 0209   ALKPHOS 327 (H) 12/01/2013 1440   BILITOT 0.5 04/02/2021 0209   BILITOT 0.4 12/01/2013 1440   PROT 6.4 (L) 04/02/2021 0209   PROT 7.8 12/01/2013 1440   ALBUMIN 2.3 (L) 04/02/2021 0209   ALBUMIN 3.7 12/01/2013 1440    RADIOGRAPHIC STUDIES: DG Chest 1 View  Result Date: 03/30/2021 CLINICAL DATA:  Possible pneumothorax EXAM: CHEST  1 VIEW COMPARISON:  03/29/2021 FINDINGS: Spiculated right upper lobe lung mass with relative hyperlucency in the vicinity but without change, prior chest CT did not demonstrate corresponding abnormality other than emphysema. No discrete pleural line is seen. Small left pleural effusion. Mild cardiomegaly with increasing vascular congestion and pulmonary edema. Subsegmental atelectasis at the left lung base. Drainage catheter appears removed. IMPRESSION: 1. Mild cardiomegaly with increasing vascular congestion/mild pulmonary edema and small left effusion 2. Spiculated right upper lobe lung mass. Asymmetrical hyperlucency surrounding the mass without change and no definitive pleural line to suggest  pneumothorax. Electronically Signed   By: Donavan Foil M.D.   On: 03/30/2021 01:11   DG Chest 1 View  Result Date: 03/27/2021 CLINICAL DATA:  Shortness of breath EXAM: CHEST  1 VIEW COMPARISON:  None. FINDINGS: Cardiomegaly. 4.8 cm mass projects within the right upper lobe. Mildly hyperexpanded lungs with coarsened interstitial markings bilaterally. No pleural effusion or pneumothorax. No acute bony findings. IMPRESSION: 1. 4.8 cm mass within the right upper lobe. CT chest with contrast is recommended for further evaluation. 2. Cardiomegaly. Electronically Signed   By: Davina Poke D.O.   On: 03/27/2021 15:52   CT Angio Chest PE W and/or Wo Contrast  Result Date: 03/27/2021 CLINICAL DATA:  Tachycardia, short of breath, pleuritic chest pain, abnormal chest x-ray EXAM: CT ANGIOGRAPHY CHEST WITH CONTRAST TECHNIQUE: Multidetector CT imaging of the chest was performed using the standard protocol during bolus administration of intravenous contrast. Multiplanar CT image reconstructions and MIPs were obtained to evaluate the vascular anatomy. CONTRAST:  168m OMNIPAQUE IOHEXOL 350 MG/ML SOLN COMPARISON:  03/27/2021 FINDINGS: Cardiovascular: This is a technically adequate evaluation of the pulmonary vasculature. No filling defects or pulmonary emboli. There is a large pericardial effusion measuring up to 2.3 cm in thickness. Uniform decreased attenuation suggest transudative effusion. Normal caliber of the thoracic aorta. Mild atherosclerosis of the aorta and coronary vasculature. Mediastinum/Nodes: Pathologically enlarged lymph nodes are seen within the mediastinum, measuring up to 13 mm in short axis reference image 24/4. Adenopathy is also seen within the right supraclavicular region and left axilla. Thyroid, trachea, and esophagus are grossly unremarkable. Lungs/Pleura: There is a large spiculated right upper lobe mass corresponding to the chest x-ray finding, measuring 4.7 x 3.8 by 4.0 cm. Diffuse  interlobular septal thickening throughout the right lung concerning for lymphangitic spread of disease. There is background emphysema. No effusion or  pneumothorax. Central airways are patent. Upper Abdomen: No acute abnormality. Musculoskeletal: No acute or destructive bony lesions. Reconstructed images demonstrate no additional findings. Review of the MIP images confirms the above findings. IMPRESSION: 1. 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 Diffuse interlobular septal thickening throughout the right lung concerning for lymphangitic spread of disease. 2. Large pericardial effusion. 3. Lymphadenopathy within the mediastinum, right supraclavicular region, and left axilla, concerning for metastatic disease. 4. No evidence of pulmonary embolus. 5. Aortic Atherosclerosis (ICD10-I70.0) and Emphysema (ICD10-J43.9). Electronically Signed   By: Randa Ngo M.D.   On: 03/27/2021 18:24   CARDIAC CATHETERIZATION  Result Date: 03/27/2021 Conclusions: Successful pericardiocentesis and tube pericardiotomy via the subxiphoid approach, yielding 500 mL of serosanginous fluid. Recommendations: Admit to ICU. Obtain STAT portable CXR. Follow-up fluid analyses, including cytology. Repeat echocardiogram tomorrow.  Anticipate removal of drain when output is less than 50-100 mL/24 hours and minimal residual fluid is present by echo. Nelva Bush, MD [facility]  US Renal  Result Date: 03/27/2021 CLINICAL DATA:  UTI EXAM: RENAL / URINARY TRACT ULTRASOUND COMPLETE COMPARISON:  CT 03/27/2021 FINDINGS: Right Kidney: Renal measurements: 10.9 x 4 x 4.4 cm = volume: 101 mL. Echogenicity within normal limits. No mass or hydronephrosis visualized. Left Kidney: Renal measurements: 11.1 x 4.9 x 5.8 cm = volume: 165 mL. Echogenicity within normal limits. No mass or hydronephrosis visualized. Bladder: Appears normal for degree of bladder distention. Other: None. IMPRESSION: Negative renal ultrasound  Electronically Signed   By: Donavan Foil M.D.   On: 03/27/2021 19:37   DG Chest Port 1 View  Result Date: 04/01/2021 CLINICAL DATA:  Shortness of breath EXAM: PORTABLE CHEST 1 VIEW COMPARISON:  CT 03/27/2021, radiograph 03/30/2021 FINDINGS: Diminishing lung volumes with increasing veiling opacity in the lung bases, right greater than left which could reflect developing pleural effusions. Pulmonary vascularity is indistinct. Cardiomediastinal silhouette is stable from prior with known large pericardial effusion. Redemonstration of a large rounded masslike opacity in the right upper lung corresponding to the known right upper lung malignancy. No acute or worrisome osseous or soft tissue abnormality of the chest wall. IMPRESSION: Diminishing lung volumes. Increasing veiling opacity may reflect developing pleural effusions. Additional opacity in the bases may reflect some passive atelectatic change or underlying airspace opacity. Diffusely worsening interstitial opacity, concerning for developing edema as well given pulmonary vascular congestion. Underlying lymphangitic spread is not fully excluded. Redemonstrated masslike opacity in the right upper lobe. Electronically Signed   By: Lovena Le M.D.   On: 04/01/2021 21:16   DG Chest Port 1 View  Result Date: 03/29/2021 CLINICAL DATA:  Shortness of breath and chest pain EXAM: PORTABLE CHEST 1 VIEW COMPARISON:  03/27/2021 FINDINGS: Cardiac shadow is mildly enlarged but stable. Spiculated right upper lobe mass lesion is again noted. Mild increase in parenchymal edema is seen. Small bore catheter is again noted over the cardiac shadow likely related to a pericardial drain. No pneumothorax is noted. IMPRESSION: Increasing edema particularly on the right. Stable right upper lobe mass lesion. No new focal abnormality is noted. Electronically Signed   By: Inez Catalina M.D.   On: 03/29/2021 09:02   DG Chest Port 1 View  Result Date: 03/27/2021 CLINICAL DATA:   Cardiac tamponade EXAM: PORTABLE CHEST 1 VIEW COMPARISON:  Chest x-ray 03/27/2021, CT chest 03/27/2021 FINDINGS: Right upper lobe lung mass redemonstrated. Emphysema with bronchitic changes. Cardiomegaly. Interim placement of drainage catheter over the heart presumably a pericardial drainage catheter. Cardiac silhouette decreased compared  to prior. Slight increased interstitial opacity possibly superimposed edema. No pneumothorax IMPRESSION: 1. Placement of presumed pericardial drainage catheter with decreased cardiomegaly 2. Emphysema with chronic bronchitic change, slight increased interstitial opacity may reflect superimposed mild edema 3. Redemonstrated spiculated lung mass at the right apex Electronically Signed   By: Donavan Foil M.D.   On: 03/27/2021 22:28   ECHOCARDIOGRAM COMPLETE  Result Date: 03/28/2021    ECHOCARDIOGRAM REPORT   Patient Name:   ANDREIA GANDOLFI Date of Exam: 03/28/2021 Medical Rec #:  [id number]   Height:       61.5 in Accession #:    [id number]  Weight:       128.7 lb Date of Birth:  [birth year]  BSA:          1.576 m Patient Age:    29 years    BP:           128/88 mmHg Patient Gender: F           HR:           105 bpm. Exam Location:  [facility] Procedure: 2D Echo, Color Doppler, Cardiac Doppler and Intracardiac            Opacification Agent Indications:     I31.4 Tamponade  History:         Patient has no prior history of Echocardiogram examinations.                  Signs/Symptoms:Shortness of Breath; Risk Factors:Current                  Smoker. Graves disease.  Sonographer:     Charmayne Sheer RDCS (AE) Referring Phys:  [id number] CHRISTOPHER END Diagnosing Phys: Kate Sable MD  Sonographer Comments: Technically difficult study due to poor echo windows. IMPRESSIONS  1. Left ventricular ejection fraction, by estimation, is 55%. The left ventricle has normal function. The left ventricle has no regional wall motion abnormalities. Left ventricular diastolic parameters are consistent with Grade II  diastolic dysfunction (pseudonormalization).  2. Right ventricular systolic function is normal. The right ventricular size is normal.  3. The mitral valve is normal in structure. No evidence of mitral valve regurgitation.  4. The aortic valve was not well visualized. Aortic valve regurgitation is not visualized.  5. The inferior vena cava is dilated in size with >50% respiratory variability, suggesting right atrial pressure of 8 mmHg. FINDINGS  Left Ventricle: Left ventricular ejection fraction, by estimation, is 55%. The left ventricle has normal function. The left ventricle has no regional wall motion abnormalities. Definity contrast agent was given IV to delineate the left ventricular endocardial Kaylob Wallen. The left ventricular internal cavity size was normal in size. There is no left ventricular hypertrophy. Left ventricular diastolic parameters are consistent with Grade II diastolic dysfunction (pseudonormalization). Right Ventricle: The right ventricular size is normal. No increase in right ventricular wall thickness. Right ventricular systolic function is normal. Left Atrium: Left atrial size was normal in size. Right Atrium: Right atrial size was normal in size. Pericardium: Trivial pericardial effusion is present. Mitral Valve: The mitral valve is normal in structure. No evidence of mitral valve regurgitation. Tricuspid Valve: The tricuspid valve is not well visualized. Tricuspid valve regurgitation is not demonstrated. Aortic Valve: The aortic valve was not well visualized. Aortic valve regurgitation is not visualized. Aortic valve mean gradient measures 2.0 mmHg. Aortic valve peak gradient measures 4.7 mmHg. Aortic valve area, by VTI measures 3.44 cm. Pulmonic Valve: The pulmonic valve was  not well visualized. Pulmonic valve regurgitation is not visualized. Aorta: The aortic root is normal in size and structure. Venous: The inferior vena cava is dilated in size with greater than 50% respiratory  variability, suggesting right atrial pressure of 8 mmHg. IAS/Shunts: No atrial level shunt detected by color flow Doppler.  LEFT VENTRICLE PLAX 2D LVIDd:         3.90 cm  Diastology LVIDs:         3.30 cm  LV e' medial:    9.46 cm/s LV PW:         1.20 cm  LV E/e' medial:  8.9 LV IVS:        0.90 cm  LV e' lateral:   4.57 cm/s LVOT diam:     2.20 cm  LV E/e' lateral: 18.4 LV SV:         54 LV SV Index:   34 LVOT Area:     3.80 cm  LEFT ATRIUM           Index LA diam:      3.70 cm 2.35 cm/m LA Vol (A4C): 38.0 ml 24.11 ml/m  AORTIC VALVE                   PULMONIC VALVE AV Area (Vmax):    3.40 cm    PV Vmax:       0.97 m/s AV Area (Vmean):   3.66 cm    PV Vmean:      65.700 cm/s AV Area (VTI):     3.44 cm    PV VTI:        0.122 m AV Vmax:           108.00 cm/s PV Peak grad:  3.7 mmHg AV Vmean:          71.900 cm/s PV Mean grad:  2.0 mmHg AV VTI:            0.156 m AV Peak Grad:      4.7 mmHg AV Mean Grad:      2.0 mmHg LVOT Vmax:         96.60 cm/s LVOT Vmean:        69.300 cm/s LVOT VTI:          0.141 m LVOT/AV VTI ratio: 0.90  AORTA Ao Root diam: 2.80 cm MITRAL VALVE MV Area (PHT): 5.07 cm    SHUNTS MV Decel Time: 150 msec    Systemic VTI:  0.14 m MV E velocity: 84.23 cm/s  Systemic Diam: 2.20 cm MV A velocity: 88.23 cm/s MV E/A ratio:  0.95 Kate Sable MD Electronically signed by Kate Sable MD Signature Date/Time: 03/28/2021/1:51:00 PM    Final    ECHOCARDIOGRAM LIMITED  Result Date: 04/02/2021    ECHOCARDIOGRAM LIMITED REPORT   Patient Name:   CANAAN PRUE Date of Exam: 04/02/2021 Medical Rec #:  [id number]   Height:       61.5 in Accession #:    [id number]  Weight:       137.6 lb Date of Birth:  Dec 21, 1960  BSA:          1.621 m Patient Age:    55 years    BP:           106/77 mmHg Patient Gender: F           HR:           87 bpm. Exam Location:  [facility] Procedure: Limited Echo, Color  Doppler and Cardiac Doppler Indications:     Pericardial Effusion I31.3  History:         Patient has prior  history of Echocardiogram examinations, most                  recent 03/29/2021. COPD; Risk Factors:Hypertension.  Sonographer:     Sherrie Sport RDCS (AE) Referring Phys:  [id number] [place] Diagnosing Phys: Harrell Gave End MD  Sonographer Comments: Image acquisition challenging due to COPD. IMPRESSIONS  1. Left ventricular ejection fraction, by estimation, is >55%. The left ventricle has normal function.  2. Right ventricular systolic function is normal. Moderately increased right ventricular wall thickness.  3. Echogenic material in the pericardial space is noted, which could represent epicardial fat. However, in the setting of malignant pericardial effusion, tumor cannot be excluded. FINDINGS  Left Ventricle: Left ventricular ejection fraction, by estimation, is >55%. The left ventricle has normal function. The left ventricular internal cavity size was normal in size. There is borderline left ventricular hypertrophy. Right Ventricle: Moderately increased right ventricular wall thickness. Right ventricular systolic function is normal. Pericardium: Echogenic material in the pericardial space is noted, which could represent epicardial fat. However, in the setting of malignant pericardial effusion, tumor cannot be excluded. Trivial pericardial effusion is present. LEFT VENTRICLE PLAX 2D LVIDd:         4.23 cm LVIDs:         2.58 cm LV PW:         1.01 cm LV IVS:        1.01 cm LVOT diam:     2.00 cm LVOT Area:     3.14 cm  LEFT ATRIUM         Index LA diam:    3.60 cm 2.22 cm/m   AORTA Ao Root diam: 2.90 cm  SHUNTS Systemic Diam: 2.00 cm Nelva Bush MD Electronically signed by Nelva Bush MD Signature Date/Time: 04/02/2021/12:36:25 PM    Final    ECHOCARDIOGRAM LIMITED  Result Date: 03/29/2021    ECHOCARDIOGRAM LIMITED REPORT   Patient Name:   ASEES MANFREDI Date of Exam: 03/29/2021 Medical Rec #:  [id number]   Height:       61.5 in Accession #:    [id number]  Weight:       136.5 lb Date of Birth:   1960/11/02  BSA:          1.615 m Patient Age:    72 years    BP:           137/91 mmHg Patient Gender: F           HR:           116 bpm. Exam Location:  [facility] Procedure: Cardiac Doppler, Color Doppler and Limited Echo Indications:     Pericardial Effusion I31.3  History:         Patient has prior history of Echocardiogram examinations, most                  recent 03/28/2021. Risk Factors:Hypertension. Cardiac tamponade.  Sonographer:     Sherrie Sport RDCS (AE) Referring Phys:  [id number] [place] Diagnosing Phys: Ida Rogue MD  Sonographer Comments: Technically difficult study due to poor echo windows. IMPRESSIONS  1. Trvial pericardial effusion.  2. Left ventricular ejection fraction, by estimation, is 60 to 65%. The left ventricle has normal function. The left ventricle has no regional wall motion abnormalities.  3. Right ventricular systolic function is  normal. The right ventricular size is normal.  4. The mitral valve is normal in structure. No evidence of mitral valve regurgitation. No evidence of mitral stenosis. FINDINGS  Left Ventricle: Left ventricular ejection fraction, by estimation, is 60 to 65%. The left ventricle has normal function. The left ventricle has no regional wall motion abnormalities. The left ventricular internal cavity size was normal in size. There is  no left ventricular hypertrophy. Right Ventricle: The right ventricular size is normal. No increase in right ventricular wall thickness. Right ventricular systolic function is normal. Left Atrium: Left atrial size was normal in size. Right Atrium: Right atrial size was normal in size. Pericardium: Trivial pericardial effusion is present. Mitral Valve: The mitral valve is normal in structure. No evidence of mitral valve stenosis. Tricuspid Valve: The tricuspid valve is normal in structure. Tricuspid valve regurgitation is not demonstrated. No evidence of tricuspid stenosis. Aortic Valve: The aortic valve was not well visualized.  Aortic valve regurgitation is not visualized. No aortic stenosis is present. Pulmonic Valve: The pulmonic valve was normal in structure. Pulmonic valve regurgitation is not visualized. No evidence of pulmonic stenosis. Aorta: The aortic root is normal in size and structure. Venous: The inferior vena cava is normal in size with greater than 50% respiratory variability, suggesting right atrial pressure of 3 mmHg. IAS/Shunts: No atrial level shunt detected by color flow Doppler. LEFT VENTRICLE PLAX 2D LVIDd:         4.04 cm LVIDs:         2.55 cm LV PW:         1.31 cm LV IVS:        0.93 cm LVOT diam:     2.00 cm LVOT Area:     3.14 cm  LEFT ATRIUM           Index      RIGHT ATRIUM           Index LA diam:      3.10 cm 1.92 cm/m RA Area:     10.20 cm LA Vol (A4C): 11.2 ml 6.93 ml/m RA Volume:   23.00 ml  14.24 ml/m   AORTA Ao Root diam: 2.80 cm TRICUSPID VALVE TR Peak grad:   4.8 mmHg TR Vmax:        110.00 cm/s  SHUNTS Systemic Diam: 2.00 cm Ida Rogue MD Electronically signed by Ida Rogue MD Signature Date/Time: 03/29/2021/3:32:05 PM    Final     PERFORMANCE STATUS (ECOG) : 4 - Bedbound  Review of Systems Unless otherwise noted, a complete review of systems is negative.  Physical Exam General: Frail appearing Cardiovascular: regular rate and rhythm Pulmonary: Coarse anterior fields Abdomen: soft, nontender, + bowel sounds GU: no suprapubic tenderness Extremities: no edema, no joint deformities Skin: no rashes Neurological: Weakness but otherwise nonfocal  IMPRESSION: Patient remains in ICU.  She is currently off BiPAP after being diuresed overnight.  Patient reports that her breathing is slightly improved.  I met with patient and her daughter.  Together, we discussed overall goals.  Patient is aware that she appears to have a stage IV lung cancer, which will ultimately prove incurable.  Her sister died from lung cancer that had metastasized to the brain.  Patient watched her sister  undergo cancer treatment with deleterious effects.  Patient says that she is not sure that she would pursue chemo or other cancer treatments if such treatments were felt to have potential for a high symptomatic burden.  Dr. Rogue Bussing is  following and also spoke with patient today.  Patient is not stable enough currently to consider cancer treatment.  It is unclear if she will stabilize enough to allow future treatment.  Patient's daughter is hoping to complete advance directives and other legal documents today.  We had an extensive conversation regarding CODE STATUS and patient/family are considering decisions.  Both patient and daughter verbalized an understanding that resuscitation would likely prove futile in the setting of an advanced and untreated malignancy.    PLAN: -Continue current scope of treatment -Patient family considering CODE STATUS -Will follow  Case and plan discussed with Drs. Johnn Hai  Time Total: 60 minutes  Visit consisted of counseling and education dealing with the complex and emotionally intense issues of symptom management and palliative care in the setting of serious and potentially life-threatening illness.Greater than 50%  of this time was spent counseling and coordinating care related to the above assessment and plan.  Signed by: Altha Harm, PhD, NP-C

## 2021-04-02 NOTE — Assessment & Plan Note (Addendum)
#  60 year old female patient with history of smoking currently admitted to hospital for pericardial tamponade/malignant pericardial effusion/acute respiratory failure  # Malignant pericardial tamponade-s/p pericardiocentesis;-secondary to lung adenocarcinoma-repeat 2D echo negative for any pericardial effusion.Marland Kitchen  #Acute respiratory failure-multifactorial-malignancy/lymphangitic spread/COPD stable-significant provement s/p/bronchodilators/diuretics.  Stable/improved  #A. Fib-amiodarone/Cardizem as per cardiology.  Stable  #Leukocytosis White count 20 ~likely secondary to steroids.  #I had a long discussion again with the patient [significant other on the phone]-regarding chemoimmunotherapy-carbo Alimta Keytruda every 3 weeks x 4 cycles.  Followed by Alimta Keytruda maintenance.  Check PD-L1/NGS if possible.  Discussed the potential side effects including but not limited to-increasing fatigue, nausea vomiting, diarrhea, hair loss, sores in the mouth, increase risk of infection and also neuropathy.  Again understands treatments are palliative not curative.  After lengthy discussion patient-states that she wants to go to [facility]; and get all her treatments at [facility].  Also considering complementary therapy/holistic medicine which I think is reasonable but I told her that it would be inadequate to treat her aggressive disease.   # 40 minutes face-to-face with the patient discussing the above plan of care; more than 50% of time spent on prognosis/ natural history; counseling and coordination.  Addendum: Later I got a message from [place]; cardiology the patient has changed her mind and she was to follow-up locally for treatment options.  Informed Dr. Jimmye Norman the patient could be discharged from oncology standpoint; and we will follow-up later in the week/plan outpatient chemotherapy.

## 2021-04-03 DIAGNOSIS — I480 Paroxysmal atrial fibrillation: Secondary | ICD-10-CM

## 2021-04-03 LAB — COMPREHENSIVE METABOLIC PANEL
ALT: 26 U/L (ref 0–44)
AST: 29 U/L (ref 15–41)
Albumin: 2.2 g/dL — ABNORMAL LOW (ref 3.5–5.0)
Alkaline Phosphatase: 71 U/L (ref 38–126)
Anion gap: 6 (ref 5–15)
BUN: 8 mg/dL (ref 6–20)
CO2: 33 mmol/L — ABNORMAL HIGH (ref 22–32)
Calcium: 8.8 mg/dL — ABNORMAL LOW (ref 8.9–10.3)
Chloride: 92 mmol/L — ABNORMAL LOW (ref 98–111)
Creatinine, Ser: 0.47 mg/dL (ref 0.44–1.00)
GFR, Estimated: >60 mL/min (ref >60)
Glucose, Bld: 173 mg/dL — ABNORMAL HIGH (ref 70–99)
Potassium: 4.2 mmol/L (ref 3.5–5.1)
Sodium: 131 mmol/L — ABNORMAL LOW (ref 135–145)
Total Bilirubin: 0.8 mg/dL (ref 0.3–1.2)
Total Protein: 6.4 g/dL — ABNORMAL LOW (ref 6.5–8.1)

## 2021-04-03 LAB — CBC
HCT: 41.1 % (ref 36.0–46.0)
Hemoglobin: 13.9 g/dL (ref 12.0–15.0)
MCH: 29.6 pg (ref 26.0–34.0)
MCHC: 33.8 g/dL (ref 30.0–36.0)
MCV: 87.6 fL (ref 80.0–100.0)
Platelets: 448 10*3/uL — ABNORMAL HIGH (ref 150–400)
RBC: 4.69 MIL/uL (ref 3.87–5.11)
RDW: 12.6 % (ref 11.5–15.5)
WBC: 15.2 10*3/uL — ABNORMAL HIGH (ref 4.0–10.5)
nRBC: 0 % (ref 0.0–0.2)

## 2021-04-03 LAB — PHOSPHORUS: Phosphorus: 3.6 mg/dL (ref 2.5–4.6)

## 2021-04-03 LAB — BRAIN NATRIURETIC PEPTIDE: B Natriuretic Peptide: 382.5 pg/mL — ABNORMAL HIGH (ref 0.0–100.0)

## 2021-04-03 LAB — MAGNESIUM: Magnesium: 2.4 mg/dL (ref 1.7–2.4)

## 2021-04-03 MED ORDER — FUROSEMIDE 10 MG/ML IJ SOLN
60.0000 mg | Freq: Every day | INTRAMUSCULAR | Status: DC
  Administered 2021-04-03 – 2021-04-06 (×4): 60 mg via INTRAVENOUS
  Filled 2021-04-03 (×4): qty 6

## 2021-04-03 MED ORDER — AMIODARONE HCL IN DEXTROSE 360-4.14 MG/200ML-% IV SOLN
30.0000 mg/h | INTRAVENOUS | Status: DC
  Administered 2021-04-03 – 2021-04-04 (×2): 30 mg/h via INTRAVENOUS
  Filled 2021-04-03 (×2): qty 200

## 2021-04-03 MED ORDER — AMIODARONE HCL IN DEXTROSE 360-4.14 MG/200ML-% IV SOLN
60.0000 mg/h | INTRAVENOUS | Status: AC
  Administered 2021-04-03: 60 mg/h via INTRAVENOUS
  Filled 2021-04-03: qty 200

## 2021-04-03 NOTE — Progress Notes (Signed)
NAME:  Nichole Park, MRN:  [id number], DOB:  Mar 12, 1961, LOS: 7 ADMISSION DATE:  03/27/2021, CONSULTATION DATE:  03/27/2021 REFERRING MD:  Dr. Saunders Revel, CHIEF COMPLAINT:  SOB   Brief Pt Description / Synopsis:  60 y.o. 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   History of Present Illness:  A 60 year old  female who presented to the [facility] ED from [facility] walk in Clinic with sharp epigastric pain, progressive SOB onset 3 weeks ago associated with generalized weakness. Patient reported generalized weakness and mild left-sided chest discomfort that worsened with inspiration and was not associated with exertion. She reports intermittent systemic symptoms of fevers, and weight loss (8-10lbs over a month) , productive cough, and dark urine. She has history of cigarette smoking 1 pack /day since the age of 60 years old. When the symptoms worsened yesterday, she went to walk in clinic who sent him to the emergency department for further evaluation of these symptoms.   ED Course: On arrival to the ED, she was afebrile with blood pressure 102/80 mm Hg and pulse rate 119 beats/min, RR 25 breaths per minute, oxygen saturation was over 95% on room air.T Laboratory evaluation was significant for  WBC/Hgb/Hct/Plts:  21.7, 21.1/15.7, 15.6/45.4, 46.6/467, 490 (07/19 1502). Na+ 131, CO2 20, glucose 144, BUN 34, otherwise unremarkable CMP. Other Labs: Baseline procalcitonin <0.10, TSH 8.150, Lactate: 5.3>3.9, hcg 64. UA: Positive for UTI, Pregnancy, Urine POSITIVE.  CXR revealed 4.8 cm mass within the right upper lobe.  CTA Chest: Spiculated 4.7 cm right upper lobe mass consistent with malignancy. Diffuse interlobular septal thickening throughout the right lung concerning for lymphangitic spread of disease.2. Large pericardial effusion. 3. Lymphadenopathy within the mediastinum, right supraclavicular region, and  left axilla, concerning for metastatic disease.   Given abnormal  findings as above, cardiology was consulted who subsequently performed a bedside echocardiography (ECHO) showing cardiac tamponade physiology.Pulses paradoxus was also abnormal at 20-30 mm Hg. Due to progressive symptoms and findings as above, patient was taken to the cath lab urgently for pericardiocentesis and pericardial drain placement. 500cc of serosanguinous fluid was removed which improved the patient's symptoms remarkably. PCCM consulted for admission to the ICU.  Pertinent  Medical History  Graves Disease Tobacco use Micro Data:  7/19: SARS-CoV-2 PCR>> negative 7/19: Influenza PCR>> negative 7/19: Blood culture x2>> 7/19: Urine Culture>> negative 7/19: MRSA PCR>> negative 7/19: Pericardial fluid>> no growth in culture  Antimicrobials:  Cefepime 7/19>>7/19 Ceftriaxone 7/20>> 7/21  Consults:  Cardiology Hematology/Oncology  Significant Diagnostic Tests:  7/19: Renal Ultrasound: Negative 7/19: CXR: 4.8 cm mass within the right upper lobe 7/19: CTA Chest :Spiculated 4.7 cm right upper lobe mass consistent with malignancy. Diffuse interlobular septal thickening throughout the right lung concerning for lymphangitic spread of disease.2. Large pericardial effusion. 3. Lymphadenopathy within the mediastinum, right supraclavicular region, and left axilla, concerning for metastatic disease  Significant Hospital Events: Including procedures, antibiotic start and stop dates in addition to other pertinent events   7/19: Admitted to ICU with pericardial effusion and cardiac tamponade s/p pericardiocentesis and pericardial drain placement. 7/20: Oncology consulted, hemodynamically stable, plan to transfer out of ICU 7/21: transferred to [facility] service, pericardial drain pulled due to no significant output over previous 24 hours.  After drain removed patient having paroxysmal atrial fibrillation with a rapid rate in the 180s and  started on diltiazem infusion 7/22: Amiodarone drip added with extra IV bolus for rates in the  180s, diltiazem drip weaned due to hypotension.  EBUS recommended as outpatient procedure 7/23: Pericardial fluid cytology positive for malignancy.  Amiodarone drip increased to 60 mg an hour with additional bolus and diltiazem drip added back. 7/24: Worsening respiratory status due to increased work of breathing.  PCCM re-consulted > stat chest x-ray revealed increased pulmonary edema, bedside POC ultrasound negative for significant reaccumulation of pericardial effusion, BiPAP initiated. 7/25: Remains in the SDU, pericardial cytology positive for carcinoma, further stains pending 7/26: Feels better overall, less dyspnea.  Interim History / Subjective:  Comfortable appearing today.  No tachypnea.  Currently on nasal cannula O2, responded to diuretics.  Continue diuresis as tolerated.notes that nebulized bronchodilators are helping her.  Objective   Blood pressure 92/72, pulse (!) 128, temperature 98.1 F (36.7 C), temperature source Axillary, resp. rate 20, height 5' 1.5" (1.562 m), weight 63.6 kg, SpO2 95 %.    FiO2 (%):  [32 %] 32 %   Intake/Output Summary (Last 24 hours) at 04/03/2021 1437 Last data filed at 04/03/2021 0600 Gross per 24 hour  Intake --  Output 1300 ml  Net -1300 ml    Filed Weights   04/01/21 0500 04/02/21 0500 04/03/21 0500  Weight: 62 kg 62.4 kg 63.6 kg    Examination: General: Adult female, acute on chronically ill, sitting up in bed, no tachypnea, no conversational dyspnea.  HEENT: MM pink/moist, anicteric, atraumatic, neck supple Neuro: A&O x 4, able to follow commands, PERRL +3 , MAE CV: s1s2 RRR, ST on monitor, no r/m/g Pulm: No respiratory distress, breath sounds coarse-BUL & diminished-BLL, no wheezes noted GI: soft, rounded, non tender, bs x 4 GU: Pure wick in place with clear yellow urine Skin: Limited exam- no rashes/lesions noted Extremities: warm/dry,  pulses + 2 R/P, trace edema noted  Resolved Hospital Problem list   Sepsis with septic shock due to UTI > ruled out  Assessment & Plan:  Acute Hypoxic Respiratory Failure secondary to pulmonary edema in the setting of RUL malignant lung mass PMHx: Tobacco use, COPD by CT findings - Continue diuretics as tolerated - Continue supplemental oxygen to maintain O2 sats 88 to 92% - Morphine 2 mg IV as needed every 4 for air hunger & work of breathing - Intermittent chest x-ray & ABG PRN - Ensure adequate pulmonary hygiene  - Improved with Brovana/Pulmicort, Yupelri, and as needed levo albuterol - IV Solu-Medrol milligrams IV every 12, consider switching to p.o. in a.m.  Pericardial effusion with cardiac tamponade  in the setting of suspected Lung malignancy (possible lung adenocarcinoma) in patient with hx of smoking since age 60 S/p emergent pericardiocentesis with removal of 400 ml of hemorrhagic pericardial fluid  & pericardial  drainage tube placement CT chest shows spiculated 4.7 cm right upper lobe mass consistent with malignancy with mets.  Pericardial drain removed 03/29/21.  Cytology positive for malignancy, special stains ordered, culture pending still. -Echocardiogram shows no reaccumulation of fluid however there appears to be echogenic material in the pericardial space that is suspected to be tumor -Cardiology following, appreciate input -Oncology following, appreciate input -Is not a candidate for EBUS procedure due to the inability to tolerate general anesthesia in her current condition -Appreciate palliative care and oncology input   Paroxysmal Atrial Fibrillation with bursts of rapid Ventricular Response No systemic anticoagulation at this time due to hemorrhagic nature of pericardial fluid. - Cardizem off - Continue amiodarone infusion at 60 mg/h - Cardiology consulted, appreciate input - Continuous cardiac monitoring  Graves Disease TSH  elevated 8.1 repeat 3.2 ~ 3.0. Free  T4 slightly elevated at 1.22 -PTU 100 mg every 8 initiated per primary service - f/u LFTs  Best Practice (right click and "Reselect all SmartList Selections" daily)  Diet/type: Regular consistency (see orders) DVT prophylaxis: LMWH GI prophylaxis: N/A Lines: N/A Foley:  N/A ,pure wick Code Status:  full code Last date of multidisciplinary goals of care discussion 04/02/21  Patient, updated at bedside on plan of care.   Labs   CBC: Recent Labs  Lab 03/27/21 1502 03/28/21 0131 03/29/21 0413 04/01/21 0617 04/02/21 0209 04/03/21 0327  WBC 21.1*  21.7* 20.3* 16.7* 17.7* 17.8* 15.2*  NEUTROABS 15.1*  --   --   --  12.6*  --   HGB 15.6*  15.7* 13.8 14.7 13.5 14.0 13.9  HCT 46.6*  45.4 40.5 43.7 39.5 41.7 41.1  MCV 88.4  88.0 87.7 86.5 87.4 86.0 87.6  PLT 490*  467* 377 423* 350 440* 448*     Basic Metabolic Panel: Recent Labs  Lab 03/28/21 0131 03/29/21 0413 03/30/21 0452 03/31/21 0444 04/01/21 0617 04/02/21 0209 04/03/21 0327  NA 135   < > 132* 131* 130* 129* 131*  K 4.1   < > 3.7 3.8 3.3* 3.5 4.2  CL 104   < > 99 95* 90* 88* 92*  CO2 23   < > 25 28 31  32 33*  GLUCOSE 120*   < > 166* 152* 145* 152* 173*  BUN 22*   < > 9 10 7 11 8   CREATININE 0.58   < > 0.43* 0.48 0.46 0.52 0.47  CALCIUM 8.6*   < > 8.4* 8.5* 8.9 8.3* 8.8*  MG 2.2  --   --   --   --  2.1 2.4  PHOS 3.3  --   --   --   --  3.3 3.6   < > = values in this interval not displayed.    GFR: Estimated Creatinine Clearance: 67.1 mL/min (by C-G formula based on SCr of 0.47 mg/dL). Recent Labs  Lab 03/27/21 1502 03/27/21 1701 03/27/21 1751 03/27/21 2216 03/28/21 0131 03/29/21 0413 03/30/21 0452 04/01/21 0617 04/02/21 0209 04/03/21 0327  PROCALCITON <0.10  --   --   --   --  <0.10 <0.10  --   --   --   WBC 21.1*  21.7*  --   --   --  20.3* 16.7*  --  17.7* 17.8* 15.2*  LATICACIDVEN  --  5.3* 3.9* 2.5* 1.7  --   --   --   --   --      Liver Function Tests: Recent Labs  Lab 03/27/21 1635  04/02/21 0209 04/03/21 0327  AST 31 33 29  ALT 21 26 26   ALKPHOS 74 63 71  BILITOT 1.2 0.5 0.8  PROT 7.7 6.4* 6.4*  ALBUMIN 3.2* 2.3* 2.2*    No results for input(s): LIPASE, AMYLASE in the last 168 hours. No results for input(s): AMMONIA in the last 168 hours.  ABG    Component Value Date/Time   PHART 7.54 (H) 04/01/2021 2109   PCO2ART 44 04/01/2021 2109   PO2ART 78 (L) 04/01/2021 2109   HCO3 37.6 (H) 04/01/2021 2109   O2SAT 96.8 04/01/2021 2109      Coagulation Profile: Recent Labs  Lab 03/27/21 1701  INR 1.1     Cardiac Enzymes: Recent Labs  Lab 03/27/21 1635  CKTOTAL 15*     HbA1C: Hgb A1c MFr Bld  Date/Time Value Ref Range Status  03/29/2021 09:48 AM 6.2 (H) 4.8 - 5.6 % Final    Comment:    (NOTE) Pre diabetes:          5.7%-6.4%  Diabetes:              >6.4%  Glycemic control for   <7.0% adults with diabetes     CBG: Recent Labs  Lab 03/27/21 2138  GLUCAP 101*     Review of Systems: Positives in BOLD  A 10 point review of systems was performed and it is as noted above otherwise negative.  Allergies Allergies  Allergen Reactions   Metoprolol    Tapazole [Methimazole]     Scheduled Meds:  arformoterol  15 mcg Nebulization BID   budesonide (PULMICORT) nebulizer solution  0.25 mg Nebulization BID   Chlorhexidine Gluconate Cloth  6 each Topical Q0600   enoxaparin (LOVENOX) injection  40 mg Subcutaneous Q24H   feeding supplement  237 mL Oral TID BM   furosemide  60 mg Intravenous Daily   mouth rinse  15 mL Mouth Rinse BID   methylPREDNISolone (SOLU-MEDROL) injection  40 mg Intravenous Q12H   multivitamin with minerals  1 tablet Oral Daily   nicotine  21 mg Transdermal Daily   nystatin  5 mL Oral QID   potassium chloride  40 mEq Oral Daily   propylthiouracil  100 mg Oral Q8H   revefenacin  175 mcg Nebulization Daily   sodium chloride  1 g Oral TID WC   Continuous Infusions:  sodium chloride Stopped (04/02/21 0226)   amiodarone      diltiazem (CARDIZEM) infusion Stopped (03/31/21 1628)   PRN Meds:.acetaminophen, chlorpheniramine-HYDROcodone, docusate sodium, guaiFENesin, guaiFENesin-dextromethorphan, levalbuterol, LORazepam, morphine injection, polyethylene glycol   Visit time: 35 minutes   Overall prognosis is exceedingly guarded given the extent of disease.  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  Renold Don, MD Advanced Bronchoscopy PCCM [facility]  *This note was dictated using voice recognition software/Dragon.  Despite best efforts to proofread, errors can occur which can change the meaning.  Any change was purely unintentional.

## 2021-04-03 NOTE — TOC Progression Note (Signed)
Transition of Care [facility]) - Progression Note    Patient Details  Name: Nichole Park MRN: [id number] Date of Birth: [birth year]  Transition of Care [facility]) CM/SW [place], [place] Phone Number: [phone] 04/03/2021, 5:05 PM  Clinical Narrative:     Patient remains in the ICU. Patient is not medically stable for discharge.  Patient's main contact is Harden,Summer (Daughter) [phone].  Patient pending PT assessment, has not been able to participate due to anxiety and SOB.  Expected Discharge Plan: [place] Barriers to Discharge: Continued Medical Work up  Expected Discharge Plan and Services Expected Discharge Plan: [place] arrangements for the past 2 months: Single Family Home                                       Social Determinants of Health (SDOH) Interventions    Readmission Risk Interventions No flowsheet data found.

## 2021-04-03 NOTE — Progress Notes (Signed)
Progress Note  Patient Name: Nichole Park Date of Encounter: 04/03/2021  Primary Cardiologist: New to [facility] - consult by End  Subjective   Respiratory status improved when compared to rounding on 7/25, though she does continue to require significant supplemental oxygen, currently on NRB. No chest pain.  She did redevelop A. fib with controlled ventricular response around 6:24 this morning.  Repeat limited echo on 7/25 showed an LVEF greater than 55% with normal RV systolic function and mildly increased RV wall thickness with echogenic material noted in the pericardial space of uncertain etiology.  Cannot exclude epicardial fat versus possible tumor in the setting of a malignant pericardial effusion.  Inpatient Medications    Scheduled Meds:  arformoterol  15 mcg Nebulization BID   budesonide (PULMICORT) nebulizer solution  0.25 mg Nebulization BID   Chlorhexidine Gluconate Cloth  6 each Topical Q0600   enoxaparin (LOVENOX) injection  40 mg Subcutaneous Q24H   feeding supplement  237 mL Oral TID BM   mouth rinse  15 mL Mouth Rinse BID   methylPREDNISolone (SOLU-MEDROL) injection  40 mg Intravenous Q12H   multivitamin with minerals  1 tablet Oral Daily   nicotine  21 mg Transdermal Daily   nystatin  5 mL Oral QID   potassium chloride  40 mEq Oral Daily   propylthiouracil  100 mg Oral Q8H   revefenacin  175 mcg Nebulization Daily   sodium chloride  1 g Oral TID WC   Continuous Infusions:  sodium chloride Stopped (04/02/21 0226)   amiodarone     amiodarone     diltiazem (CARDIZEM) infusion Stopped (03/31/21 1628)   PRN Meds: acetaminophen, chlorpheniramine-HYDROcodone, docusate sodium, guaiFENesin, guaiFENesin-dextromethorphan, levalbuterol, LORazepam, morphine injection, polyethylene glycol   Vital Signs    Vitals:   04/03/21 0400 04/03/21 0500 04/03/21 0600 04/03/21 0752  BP: 94/74 96/76 114/85   Pulse: 87 87 89   Resp: 16 15 20    Temp: 98.1 F (36.7 C)     TempSrc:  Axillary     SpO2: 94% 95% 90% 98%  Weight:  63.6 kg    Height:        Intake/Output Summary (Last 24 hours) at 04/03/2021 1008 Last data filed at 04/03/2021 0600 Gross per 24 hour  Intake 1046.3 ml  Output 1800 ml  Net -753.7 ml    Filed Weights   04/01/21 0500 04/02/21 0500 04/03/21 0500  Weight: 62 kg 62.4 kg 63.6 kg    Telemetry    Sinus rhythm with development of A. fib with controlled ventricular response at 624 this morning- Personally Reviewed  ECG    No new tracings - Personally Reviewed  Physical Exam   GEN: No acute distress.   Neck: JVD is difficult to assess secondary to respiratory support apparatus. Cardiac: IRIR, no murmurs, rubs, or gallops.   Respiratory: Diminished breath sounds and wheezing bilaterally.  GI: Soft, nontender, non-distended.   MS: No edema; No deformity. Neuro:  Alert and oriented x 3; Nonfocal.  Psych: Normal affect.  Labs    Chemistry Recent Labs  Lab 03/27/21 1635 03/28/21 0131 04/01/21 0617 04/02/21 0209 04/03/21 0327  NA  --    < > 130* 129* 131*  K  --    < > 3.3* 3.5 4.2  CL  --    < > 90* 88* 92*  CO2  --    < > 31 32 33*  GLUCOSE  --    < > 145* 152* 173*  BUN  --    < > 7 11 8   CREATININE  --    < > 0.46 0.52 0.47  CALCIUM  --    < > 8.9 8.3* 8.8*  PROT 7.7  --   --  6.4* 6.4*  ALBUMIN 3.2*  --   --  2.3* 2.2*  AST 31  --   --  33 29  ALT 21  --   --  26 26  ALKPHOS 74  --   --  63 71  BILITOT 1.2  --   --  0.5 0.8  GFRNONAA  --    < > >60 >60 >60  ANIONGAP  --    < > 9 9 6    < > = values in this interval not displayed.      Hematology Recent Labs  Lab 04/01/21 0617 04/02/21 0209 04/03/21 0327  WBC 17.7* 17.8* 15.2*  RBC 4.52 4.85 4.69  HGB 13.5 14.0 13.9  HCT 39.5 41.7 41.1  MCV 87.4 86.0 87.6  MCH 29.9 28.9 29.6  MCHC 34.2 33.6 33.8  RDW 12.5 12.6 12.6  PLT 350 440* 448*     Cardiac EnzymesNo results for input(s): TROPONINI in the last 168 hours. No results for input(s): TROPIPOC in the  last 168 hours.   BNP Recent Labs  Lab 04/03/21 0327  BNP 382.5*      DDimer No results for input(s): DDIMER in the last 168 hours.   Radiology    DG Chest Port 1 View  Result Date: 04/01/2021 IMPRESSION: Diminishing lung volumes. Increasing veiling opacity may reflect developing pleural effusions. Additional opacity in the bases may reflect some passive atelectatic change or underlying airspace opacity. Diffusely worsening interstitial opacity, concerning for developing edema as well given pulmonary vascular congestion. Underlying lymphangitic spread is not fully excluded. Redemonstrated masslike opacity in the right upper lobe. Electronically Signed   By: Lovena Le M.D.   On: 04/01/2021 21:16    Cardiac Studies   Limited echo 04/02/2021: 1. Left ventricular ejection fraction, by estimation, is >55%. The left  ventricle has normal function.   2. Right ventricular systolic function is normal. Moderately increased  right ventricular wall thickness.   3. Echogenic material in the pericardial space is noted, which could  represent epicardial fat. However, in the setting of malignant pericardial  effusion, tumor cannot be excluded.  __________  Limited echo 03/29/2021: 1. Trvial pericardial effusion.   2. Left ventricular ejection fraction, by estimation, is 60 to 65%. The  left ventricle has normal function. The left ventricle has no regional  wall motion abnormalities.   3. Right ventricular systolic function is normal. The right ventricular  size is normal.   4. The mitral valve is normal in structure. No evidence of mitral valve  regurgitation. No evidence of mitral stenosis. __________  2D echo 03/28/2021: 1. Left ventricular ejection fraction, by estimation, is 55%. The left  ventricle has normal function. The left ventricle has no regional wall  motion abnormalities. Left ventricular diastolic parameters are consistent  with Grade II diastolic dysfunction   (pseudonormalization).   2. Right ventricular systolic function is normal. The right ventricular  size is normal.   3. The mitral valve is normal in structure. No evidence of mitral valve  regurgitation.   4. The aortic valve was not well visualized. Aortic valve regurgitation  is not visualized.   5. The inferior vena cava is dilated in size with >50% respiratory  variability, suggesting right  atrial pressure of 8 mmHg. __________  Pericardiocentesis 03/27/2021: Conclusions: Successful pericardiocentesis and tube pericardiotomy via the subxiphoid approach, yielding 500 mL of serosanginous fluid.   Recommendations: Admit to ICU. Obtain STAT portable CXR. Follow-up fluid analyses, including cytology. Repeat echocardiogram tomorrow.  Anticipate removal of drain when output is less than 50-100 mL/24 hours and minimal residual fluid is present by echo.   Patient Profile     60 y.o. female with history of Graves' disease, tobacco use of 40+ years who presented with SOB and was found to have a large pericardial effusion requiring emergent pericardiocentesis, as well as a right upper lobe mass suspicious for malignancy.   Assessment & Plan    1. 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 New onset  Afib: -Redeveloped A. fib with controlled ventricular response at 624 this morning -We will transition her back to IV amiodarone without bolus -Unable to tolerate IV diltiazem gtt due to hypotension  -CHADS2VASc at least 1 (sex category) -Not currently on full dose anticoagulation   3. Lung mass/emphysema/tobacco use: -Per primary service  -Complete cessation of tobacco use has been advised  -Prognosis appears to be poor    For questions or updates, please contact [place] Please consult [email or web address] for contact info under Cardiology/STEMI.    Signed, Christell Faith, PA-C [place] Pager: [phone] 04/03/2021, 10:08 AM

## 2021-04-03 NOTE — Progress Notes (Signed)
PROGRESS NOTE    Nichole Park  OZH:[id number] DOB: 04/27/1961 DOA: 03/27/2021 PCP: Pcp, No  Outpatient Specialists: none    Brief Narrative:   Hx graves disease, presenting with shortness of breath and chest discomfort, found to have large right upper lobar mass and pericardial effusion w/ tamponade physiology. S/p pericardial drain.    Assessment & Plan:   Principal Problem:   Cardiac tamponade Active Problems:   Cardiac/pericardial tamponade   Malignant pericardial effusion (HCC)   Tobacco use   PAF (paroxysmal atrial fibrillation) (HCC)   COPD (chronic obstructive pulmonary disease) (HCC)   Lung mass   Palliative care encounter   # Pericardial effusion  # Cardiac tamponade # Lung cancer # Tobacco abuse In setting of 4.7 cm right upper lobe mass consistent w/ malignancy with lymphangitic spread. S/p pericardiocentesis with 400 ml drainage. Drain has since been removed. Cytology confirms malignancy. Remains tachypnic and SOB. CTA neg. She is a heavy smoker. TTE shows preserved EF, diastolic dysfunction, no reaccumulation of fluid but pericardial fat vs malignancy, poss still tamponade. Pulm thinks stage 4 adenocarcinoma - EBUS with biopsy if/when patient stable, likely as outpt per Dr. Patsey Berthold - mgmt of effusion per cardiology who is following.  - onc palliative involved, prognosis is poor particularly if unable to stabilize cardiopulmonary status. Pt full code for now.  # Acute hypoxic respiratory failure 2/2 lung cancer. With lymphangitic spread. A fib likely contributory, may have element of volume overload 2/2 the afib. Respiratory status improved somewhat today, is on O2. At high risk for PE but anticoagulation contraindicated. - repeat lasix 60 iv once - cont breathing treatments - methylpred also started  # A fib w/ rvr # Acute hypoxic respiratory failure Difficulty controlling hr with dilt and also hypotensive on dilt, now on amio gtt, hr still difficult to  control.  Procalcitonin remains low, no new consolidation, do not think infection. Atelectasis and malignancy contributing to hypoxia - cont amio, per cardiology - cont lasix 60 iv qd - cont o2, will likely discharge with this - started incentive spirometry - treating graves disease as below  # Hypokalemia Resolved - cont kcl 40 qd - mg daily  # Hyponatremia Likely siadh 2/2 lung process, diuretics also contributing. Na 131 - monitor for now  # Acute cystitis? # sepsis ruled out Initially tachycardic, leukocytosis, elevated lactate. Urine suggestive of infection, culture negative. Abx discontinued  # Graves Disease TSH wnl, T4 mildly elevated. Hx diffuse goiter and elevated TSIs (in care everywhere). Hx rash w/ methimazole, stopped that several years ago and hasn't had f/u.  - given difficult-to-control a fib, though it's primarily mediated by patient's malignancy, will trial starting propylthiouricil 100 tid, will need monitoring of LFTs  # Hyperglycemia Mild. A1c 6.2   DVT prophylaxis: lovenox Code Status: full Family Communication: husband and daughter updated @ bedside 7/26  Level of care: Stepdown Status is: Inpatient  Remains inpatient appropriate because:Inpatient level of care appropriate due to severity of illness  Dispo: The patient is from: Home              Anticipated d/c is to: tbd              Patient currently is not medically stable to d/c.   Difficult to place patient No        Consultants:  Cardiology, oncology, critical care  Procedures: Pericardial drain placement, now removed  Antimicrobials:  C/p cefepime/ceftriaxone   Subjective: Breathing improved, no other complaints  Objective:  Vitals:   04/03/21 0600 04/03/21 0752 04/03/21 0800 04/03/21 1000  BP: 114/85  114/81 92/72  Pulse: 89  89 (!) 128  Resp: 20  (!) 24 20  Temp:      TempSrc:      SpO2: 90% 98% 95% 95%  Weight:      Height:        Intake/Output Summary (Last 24  hours) at 04/03/2021 1101 Last data filed at 04/03/2021 0600 Gross per 24 hour  Intake 120 ml  Output 1300 ml  Net -1180 ml   Filed Weights   04/01/21 0500 04/02/21 0500 04/03/21 0500  Weight: 62 kg 62.4 kg 63.6 kg    Examination:  General exam: chronically ill appearing Respiratory system: decreased breath sounds right upper, rales at bases, wheeze improved Cardiovascular system: S1 & S2 heard, soft systolic murmur Gastrointestinal system: Abdomen is nondistended, soft and nontender. No organomegaly or masses felt. Normal bowel sounds heard. Central nervous system: Alert and oriented. No focal neurological deficits. Extremities: Symmetric 5 x 5 power. Skin: No rashes, lesions or ulcers Psychiatry: Judgement and insight appear normal. Mood & affect appropriate.     Data Reviewed: I have personally reviewed following labs and imaging studies  CBC: Recent Labs  Lab 03/27/21 1502 03/28/21 0131 03/29/21 0413 04/01/21 0617 04/02/21 0209 04/03/21 0327  WBC 21.1*  21.7* 20.3* 16.7* 17.7* 17.8* 15.2*  NEUTROABS 15.1*  --   --   --  12.6*  --   HGB 15.6*  15.7* 13.8 14.7 13.5 14.0 13.9  HCT 46.6*  45.4 40.5 43.7 39.5 41.7 41.1  MCV 88.4  88.0 87.7 86.5 87.4 86.0 87.6  PLT 490*  467* 377 423* 350 440* 485*   Basic Metabolic Panel: Recent Labs  Lab 03/28/21 0131 03/29/21 0413 03/30/21 0452 03/31/21 0444 04/01/21 0617 04/02/21 0209 04/03/21 0327  NA 135   < > 132* 131* 130* 129* 131*  K 4.1   < > 3.7 3.8 3.3* 3.5 4.2  CL 104   < > 99 95* 90* 88* 92*  CO2 23   < > 25 28 31  32 33*  GLUCOSE 120*   < > 166* 152* 145* 152* 173*  BUN 22*   < > 9 10 7 11 8   CREATININE 0.58   < > 0.43* 0.48 0.46 0.52 0.47  CALCIUM 8.6*   < > 8.4* 8.5* 8.9 8.3* 8.8*  MG 2.2  --   --   --   --  2.1 2.4  PHOS 3.3  --   --   --   --  3.3 3.6   < > = values in this interval not displayed.   GFR: Estimated Creatinine Clearance: 67.1 mL/min (by C-G formula based on SCr of 0.47 mg/dL). Liver  Function Tests: Recent Labs  Lab 03/27/21 1635 04/02/21 0209 04/03/21 0327  AST 31 33 29  ALT 21 26 26   ALKPHOS 74 63 71  BILITOT 1.2 0.5 0.8  PROT 7.7 6.4* 6.4*  ALBUMIN 3.2* 2.3* 2.2*   No results for input(s): LIPASE, AMYLASE in the last 168 hours. No results for input(s): AMMONIA in the last 168 hours. Coagulation Profile: Recent Labs  Lab 03/27/21 1701  INR 1.1   Cardiac Enzymes: Recent Labs  Lab 03/27/21 1635  CKTOTAL 15*   BNP (last 3 results) No results for input(s): PROBNP in the last 8760 hours. HbA1C: No results for input(s): HGBA1C in the last 72 hours.  CBG: Recent Labs  Lab 03/27/21  2138  GLUCAP 101*   Lipid Profile: No results for input(s): CHOL, HDL, LDLCALC, TRIG, CHOLHDL, LDLDIRECT in the last 72 hours. Thyroid Function Tests: No results for input(s): TSH, T4TOTAL, FREET4, T3FREE, THYROIDAB in the last 72 hours.  Anemia Panel: No results for input(s): VITAMINB12, FOLATE, FERRITIN, TIBC, IRON, RETICCTPCT in the last 72 hours. Urine analysis:    Component Value Date/Time   COLORURINE AMBER (A) 03/27/2021 1701   APPEARANCEUR CLOUDY (A) 03/27/2021 1701   APPEARANCEUR Clear 12/01/2013 1439   LABSPEC 1.028 03/27/2021 1701   LABSPEC 1.005 12/01/2013 1439   PHURINE 5.0 03/27/2021 1701   GLUCOSEU 50 (A) 03/27/2021 1701   GLUCOSEU Negative 12/01/2013 1439   HGBUR NEGATIVE 03/27/2021 1701   BILIRUBINUR SMALL (A) 03/27/2021 1701   BILIRUBINUR Negative 12/01/2013 1439   KETONESUR NEGATIVE 03/27/2021 1701   PROTEINUR 30 (A) 03/27/2021 1701   NITRITE NEGATIVE 03/27/2021 1701   LEUKOCYTESUR MODERATE (A) 03/27/2021 1701   LEUKOCYTESUR 1+ 12/01/2013 1439   Sepsis Labs: @LABRCNTIP (procalcitonin:4,lacticidven:4)  ) Recent Results (from the past 240 hour(s))  Blood culture (routine single)     Status: None   Collection Time: 03/27/21  5:01 PM   Specimen: BLOOD  Result Value Ref Range Status   Specimen Description BLOOD RIGHT ANTECUBITAL  Final    Special Requests   Final    BOTTLES DRAWN AEROBIC AND ANAEROBIC Blood Culture adequate volume   Culture   Final    NO GROWTH 6 DAYS Performed at [facility], [address] [place], [place] [address]    Report Status 04/02/2021 FINAL  Final  Resp Panel by RT-PCR (Flu A&B, Covid) Nasopharyngeal Swab     Status: None   Collection Time: 03/27/21  5:01 PM   Specimen: Nasopharyngeal Swab; Nasopharyngeal(NP) swabs in vial transport medium  Result Value Ref Range Status   SARS Coronavirus 2 by RT PCR NEGATIVE NEGATIVE Final    Comment: (NOTE) SARS-CoV-2 target nucleic acids are NOT DETECTED.  The SARS-CoV-2 RNA is generally detectable in upper respiratory specimens during the acute phase of infection. The lowest concentration of SARS-CoV-2 viral copies this assay can detect is 138 copies/mL. A negative result does not preclude SARS-Cov-2 infection and should not be used as the sole basis for treatment or other patient management decisions. A negative result may occur with  improper specimen collection/handling, submission of specimen other than nasopharyngeal swab, presence of viral mutation(s) within the areas targeted by this assay, and inadequate number of viral copies(<138 copies/mL). A negative result must be combined with clinical observations, patient history, and epidemiological information. The expected result is Negative.  Fact Sheet for Patients:  EntrepreneurPulse.com.au  Fact Sheet for Healthcare Providers:  IncredibleEmployment.be  This test is no t yet approved or cleared by the [place] FDA and  has been authorized for detection and/or diagnosis of SARS-CoV-2 by FDA under an Emergency Use Authorization (EUA). This EUA will remain  in effect (meaning this test can be used) for the duration of the COVID-19 declaration under Section 564(b)(1) of the Act, 21 U.S.C.section 360bbb-3(b)(1), unless the authorization is  terminated  or revoked sooner.       Influenza A by PCR NEGATIVE NEGATIVE Final   Influenza B by PCR NEGATIVE NEGATIVE Final    Comment: (NOTE) The Xpert Xpress SARS-CoV-2/FLU/RSV plus assay is intended as an aid in the diagnosis of influenza from Nasopharyngeal swab specimens and should not be used as a sole basis for treatment. Nasal washings and aspirates are unacceptable for  Xpert Xpress SARS-CoV-2/FLU/RSV testing.  Fact Sheet for Patients: EntrepreneurPulse.com.au  Fact Sheet for Healthcare Providers: IncredibleEmployment.be  This test is not yet approved or cleared by the [place] FDA and has been authorized for detection and/or diagnosis of SARS-CoV-2 by FDA under an Emergency Use Authorization (EUA). This EUA will remain in effect (meaning this test can be used) for the duration of the COVID-19 declaration under Section 564(b)(1) of the Act, 21 U.S.C. section 360bbb-3(b)(1), unless the authorization is terminated or revoked.  Performed at [facility], [address], [place], [place] [address]   Urine Culture     Status: Abnormal   Collection Time: 03/27/21  5:01 PM   Specimen: In/Out Cath Urine  Result Value Ref Range Status   Specimen Description   Final    IN/OUT CATH URINE Performed at [facility], [address], [place], [place] [address]    Special Requests   Final    NONE Performed at [facility], [address] [place], [place] [address]    Culture (A)  Final    <10,000 COLONIES/mL INSIGNIFICANT GROWTH Performed at [place] [address], [place], [place] [address]    Report Status 03/29/2021 FINAL  Final  Blood culture (single)     Status: None   Collection Time: 03/27/21  6:05 PM   Specimen: BLOOD  Result Value Ref Range Status   Specimen Description BLOOD LEFT ANTECUBITAL  Final   Special Requests   Final    BOTTLES DRAWN AEROBIC AND ANAEROBIC Blood Culture  results may not be optimal due to an inadequate volume of blood received in culture bottles   Culture   Final    NO GROWTH 6 DAYS Performed at [facility], [address] [place], [place] [address]    Report Status 04/02/2021 FINAL  Final  Body fluid culture w Gram Stain     Status: None   Collection Time: 03/27/21  8:26 PM   Specimen: Pericardial; Body Fluid  Result Value Ref Range Status   Specimen Description   Final    PERICARDIAL Performed at [facility], [address], [place], [place] [address]    Special Requests   Final    NONE Performed at [facility], [address] [place], [place] [address]    Gram Stain   Final    RARE WBC PRESENT,BOTH PMN AND MONONUCLEAR NO ORGANISMS SEEN    Culture   Final    NO GROWTH 3 DAYS Performed at [facility], [place] [address], [place], [place] [address]    Report Status 03/31/2021 FINAL  Final  Acid Fast Smear (AFB)     Status: None   Collection Time: 03/27/21  8:26 PM   Specimen: Pericardial Fluid  Result Value Ref Range Status   AFB Specimen Processing Concentration  Final   Acid Fast Smear Negative  Final    Comment: (NOTE) Performed At: [facility] [address] [place], [place] [id number] Rush Farmer MD OT:[id number]    Source (AFB) FLUID  Final    Comment: Performed at [facility], [address] [place], [place] [address]  Culture, blood (routine x 2)     Status: None   Collection Time: 03/27/21 10:18 PM   Specimen: BLOOD RIGHT FOREARM  Result Value Ref Range Status   Specimen Description BLOOD RIGHT FOREARM  Final   Special Requests   Final    BOTTLES DRAWN AEROBIC AND ANAEROBIC Blood  Culture adequate volume   Culture   Final    NO GROWTH 6 DAYS Performed at [facility], [address] [place], [place] [address]    Report Status 04/02/2021 FINAL  Final  MRSA Next Gen by PCR, Nasal     Status: None   Collection Time: 03/27/21 11:01 PM    Specimen: Nasal Mucosa; Nasal Swab  Result Value Ref Range Status   MRSA by PCR Next Gen NOT DETECTED NOT DETECTED Final    Comment: (NOTE) The GeneXpert MRSA Assay (FDA approved for NASAL specimens only), is one component of a comprehensive MRSA colonization surveillance program. It is not intended to diagnose MRSA infection nor to guide or monitor treatment for MRSA infections. Test performance is not FDA approved in patients less than 81 years old. Performed at [facility], [address], [place], [place] [address]          Radiology Studies: DG Chest [place] 1 View  Result Date: 04/01/2021 CLINICAL DATA:  Shortness of breath EXAM: PORTABLE CHEST 1 VIEW COMPARISON:  CT 03/27/2021, radiograph 03/30/2021 FINDINGS: Diminishing lung volumes with increasing veiling opacity in the lung bases, right greater than left which could reflect developing pleural effusions. Pulmonary vascularity is indistinct. Cardiomediastinal silhouette is stable from prior with known large pericardial effusion. Redemonstration of a large rounded masslike opacity in the right upper lung corresponding to the known right upper lung malignancy. No acute or worrisome osseous or soft tissue abnormality of the chest wall. IMPRESSION: Diminishing lung volumes. Increasing veiling opacity may reflect developing pleural effusions. Additional opacity in the bases may reflect some passive atelectatic change or underlying airspace opacity. Diffusely worsening interstitial opacity, concerning for developing edema as well given pulmonary vascular congestion. Underlying lymphangitic spread is not fully excluded. Redemonstrated masslike opacity in the right upper lobe. Electronically Signed   By: Lovena Le M.D.   On: 04/01/2021 21:16   ECHOCARDIOGRAM LIMITED  Result Date: 04/02/2021    ECHOCARDIOGRAM LIMITED REPORT   Patient Name:   ZIONNA HOMEWOOD Date of Exam: 04/02/2021 Medical Rec #:  [id number]   Height:       61.5 in  Accession #:    [id number]  Weight:       137.6 lb Date of Birth:  04/15/1961  BSA:          1.621 m Patient Age:    60 years    BP:           106/77 mmHg Patient Gender: F           HR:           87 bpm. Exam Location:  [facility] Procedure: Limited Echo, Color Doppler and Cardiac Doppler Indications:     Pericardial Effusion I31.3  History:         Patient has prior history of Echocardiogram examinations, most                  recent 03/29/2021. COPD; Risk Factors:Hypertension.  Sonographer:     Sherrie Sport RDCS (AE) Referring Phys:  [id number] [place] Diagnosing Phys: Harrell Gave End MD  Sonographer Comments: Image acquisition challenging due to COPD. IMPRESSIONS  1. Left ventricular ejection fraction, by estimation, is >55%. The left ventricle has normal function.  2. Right ventricular systolic function is normal. Moderately increased right ventricular wall thickness.  3. Echogenic material in the pericardial space is noted, which could represent epicardial fat. However, in the setting of malignant pericardial effusion, tumor  cannot be excluded. FINDINGS  Left Ventricle: Left ventricular ejection fraction, by estimation, is >55%. The left ventricle has normal function. The left ventricular internal cavity size was normal in size. There is borderline left ventricular hypertrophy. Right Ventricle: Moderately increased right ventricular wall thickness. Right ventricular systolic function is normal. Pericardium: Echogenic material in the pericardial space is noted, which could represent epicardial fat. However, in the setting of malignant pericardial effusion, tumor cannot be excluded. Trivial pericardial effusion is present. LEFT VENTRICLE PLAX 2D LVIDd:         4.23 cm LVIDs:         2.58 cm LV PW:         1.01 cm LV IVS:        1.01 cm LVOT diam:     2.00 cm LVOT Area:     3.14 cm  LEFT ATRIUM         Index LA diam:    3.60 cm 2.22 cm/m   AORTA Ao Root diam: 2.90 cm  SHUNTS Systemic Diam: 2.00 cm Nelva Bush  MD Electronically signed by Nelva Bush MD Signature Date/Time: 04/02/2021/12:36:25 PM    Final         Scheduled Meds:  arformoterol  15 mcg Nebulization BID   budesonide (PULMICORT) nebulizer solution  0.25 mg Nebulization BID   Chlorhexidine Gluconate Cloth  6 each Topical Q0600   enoxaparin (LOVENOX) injection  40 mg Subcutaneous Q24H   feeding supplement  237 mL Oral TID BM   mouth rinse  15 mL Mouth Rinse BID   methylPREDNISolone (SOLU-MEDROL) injection  40 mg Intravenous Q12H   multivitamin with minerals  1 tablet Oral Daily   nicotine  21 mg Transdermal Daily   nystatin  5 mL Oral QID   potassium chloride  40 mEq Oral Daily   propylthiouracil  100 mg Oral Q8H   revefenacin  175 mcg Nebulization Daily   sodium chloride  1 g Oral TID WC   Continuous Infusions:  sodium chloride Stopped (04/02/21 0226)   amiodarone 60 mg/hr (04/03/21 0952)   amiodarone     diltiazem (CARDIZEM) infusion Stopped (03/31/21 1628)     LOS: 7 days    Time spent: 30 min    Desma Maxim, MD [facility]   If 7PM-7AM, please contact night-coverage [email or web address] Password [email or web address] 04/03/2021, 11:01 AM

## 2021-04-04 ENCOUNTER — Inpatient Hospital Stay: Payer: Medicaid Other

## 2021-04-04 LAB — CBC
HCT: 43.4 % (ref 36.0–46.0)
Hemoglobin: 14.4 g/dL (ref 12.0–15.0)
MCH: 29.1 pg (ref 26.0–34.0)
MCHC: 33.2 g/dL (ref 30.0–36.0)
MCV: 87.7 fL (ref 80.0–100.0)
Platelets: 549 10*3/uL — ABNORMAL HIGH (ref 150–400)
RBC: 4.95 MIL/uL (ref 3.87–5.11)
RDW: 12.5 % (ref 11.5–15.5)
WBC: 32 10*3/uL — ABNORMAL HIGH (ref 4.0–10.5)
nRBC: 0 % (ref 0.0–0.2)

## 2021-04-04 MED ORDER — AMIODARONE HCL 200 MG PO TABS
400.0000 mg | ORAL_TABLET | Freq: Two times a day (BID) | ORAL | Status: DC
  Administered 2021-04-04 – 2021-04-08 (×9): 400 mg via ORAL
  Filled 2021-04-04 (×9): qty 2

## 2021-04-04 NOTE — Progress Notes (Signed)
Progress Note  Patient Name: Nichole Park Date of Encounter: 04/04/2021  Primary Cardiologist: New to [facility] - consult by End  Subjective   On room air during rounds this morning with O2 saturations around 95%.  No chest pain.  She has continued to have short paroxysms of A. fib, though has maintained sinus rhythm since approximately 2100 on 7/26.  She request to have IV amiodarone discontinued today.  Repeat limited echo on 7/25 showed an LVEF greater than 55% with normal RV systolic function and mildly increased RV wall thickness with echogenic material noted in the pericardial space of uncertain etiology.  Cannot exclude epicardial fat versus possible tumor in the setting of a malignant pericardial effusion.  Final results of pericardial cytology and immunohistochemistry are still pending.  Inpatient Medications    Scheduled Meds:  amiodarone  400 mg Oral BID   arformoterol  15 mcg Nebulization BID   budesonide (PULMICORT) nebulizer solution  0.25 mg Nebulization BID   Chlorhexidine Gluconate Cloth  6 each Topical Q0600   enoxaparin (LOVENOX) injection  40 mg Subcutaneous Q24H   feeding supplement  237 mL Oral TID BM   furosemide  60 mg Intravenous Daily   mouth rinse  15 mL Mouth Rinse BID   methylPREDNISolone (SOLU-MEDROL) injection  40 mg Intravenous Q12H   multivitamin with minerals  1 tablet Oral Daily   nicotine  21 mg Transdermal Daily   nystatin  5 mL Oral QID   potassium chloride  40 mEq Oral Daily   propylthiouracil  100 mg Oral Q8H   revefenacin  175 mcg Nebulization Daily   sodium chloride  1 g Oral TID WC   Continuous Infusions:  sodium chloride Stopped (04/02/21 0226)   diltiazem (CARDIZEM) infusion Stopped (03/31/21 1628)   PRN Meds: acetaminophen, chlorpheniramine-HYDROcodone, docusate sodium, guaiFENesin, guaiFENesin-dextromethorphan, levalbuterol, LORazepam, morphine injection, polyethylene glycol   Vital Signs    Vitals:   04/04/21 0200 04/04/21  0300 04/04/21 0400 04/04/21 0500  BP: 115/66 122/76 114/72 (!) 141/122  Pulse: 81 84 85 88  Resp: (!) 21 20 19  (!) 22  Temp:      TempSrc:      SpO2: 97% 95% 96% 92%  Weight:    61.4 kg  Height:        Intake/Output Summary (Last 24 hours) at 04/04/2021 1003 Last data filed at 04/04/2021 0600 Gross per 24 hour  Intake 424.46 ml  Output 2050 ml  Net -1625.54 ml    Filed Weights   04/02/21 0500 04/03/21 0500 04/04/21 0500  Weight: 62.4 kg 63.6 kg 61.4 kg    Telemetry    Short paroxysms of A. fib with conversion to sinus rhythm around 2100 on 7/26, maintaining sinus rhythm this morning - Personally Reviewed  ECG    No new tracings - Personally Reviewed  Physical Exam   GEN: No acute distress.   Neck: JVD not elevated. Cardiac: RRR, no murmurs, rubs, or gallops.   Respiratory: Diminished breath sounds and wheezing bilaterally.  GI: Soft, nontender, non-distended.   MS: No edema; No deformity. Neuro:  Alert and oriented x 3; Nonfocal.  Psych: Normal affect.  Labs    Chemistry Recent Labs  Lab 04/01/21 0617 04/02/21 0209 04/03/21 0327  NA 130* 129* 131*  K 3.3* 3.5 4.2  CL 90* 88* 92*  CO2 31 32 33*  GLUCOSE 145* 152* 173*  BUN 7 11 8   CREATININE 0.46 0.52 0.47  CALCIUM 8.9 8.3* 8.8*  PROT  --  6.4* 6.4*  ALBUMIN  --  2.3* 2.2*  AST  --  33 29  ALT  --  26 26  ALKPHOS  --  63 71  BILITOT  --  0.5 0.8  GFRNONAA >60 >60 >60  ANIONGAP 9 9 6       Hematology Recent Labs  Lab 04/02/21 0209 04/03/21 0327 04/04/21 0445  WBC 17.8* 15.2* 32.0*  RBC 4.85 4.69 4.95  HGB 14.0 13.9 14.4  HCT 41.7 41.1 43.4  MCV 86.0 87.6 87.7  MCH 28.9 29.6 29.1  MCHC 33.6 33.8 33.2  RDW 12.6 12.6 12.5  PLT 440* 448* 549*     Cardiac EnzymesNo results for input(s): TROPONINI in the last 168 hours. No results for input(s): TROPIPOC in the last 168 hours.   BNP Recent Labs  Lab 04/03/21 0327  BNP 382.5*      DDimer No results for input(s): DDIMER in the last  168 hours.   Radiology    DG Chest Port 1 View  Result Date: 04/01/2021 IMPRESSION: Diminishing lung volumes. Increasing veiling opacity may reflect developing pleural effusions. Additional opacity in the bases may reflect some passive atelectatic change or underlying airspace opacity. Diffusely worsening interstitial opacity, concerning for developing edema as well given pulmonary vascular congestion. Underlying lymphangitic spread is not fully excluded. Redemonstrated masslike opacity in the right upper lobe. Electronically Signed   By: Lovena Le M.D.   On: 04/01/2021 21:16    Cardiac Studies   Limited echo 04/02/2021: 1. Left ventricular ejection fraction, by estimation, is >55%. The left  ventricle has normal function.   2. Right ventricular systolic function is normal. Moderately increased  right ventricular wall thickness.   3. Echogenic material in the pericardial space is noted, which could  represent epicardial fat. However, in the setting of malignant pericardial  effusion, tumor cannot be excluded.  __________  Limited echo 03/29/2021: 1. Trvial pericardial effusion.   2. Left ventricular ejection fraction, by estimation, is 60 to 65%. The  left ventricle has normal function. The left ventricle has no regional  wall motion abnormalities.   3. Right ventricular systolic function is normal. The right ventricular  size is normal.   4. The mitral valve is normal in structure. No evidence of mitral valve  regurgitation. No evidence of mitral stenosis. __________  2D echo 03/28/2021: 1. Left ventricular ejection fraction, by estimation, is 55%. The left  ventricle has normal function. The left ventricle has no regional wall  motion abnormalities. Left ventricular diastolic parameters are consistent  with Grade II diastolic dysfunction  (pseudonormalization).   2. Right ventricular systolic function is normal. The right ventricular  size is normal.   3. The mitral valve is  normal in structure. No evidence of mitral valve  regurgitation.   4. The aortic valve was not well visualized. Aortic valve regurgitation  is not visualized.   5. The inferior vena cava is dilated in size with >50% respiratory  variability, suggesting right atrial pressure of 8 mmHg. __________  Pericardiocentesis 03/27/2021: Conclusions: Successful pericardiocentesis and tube pericardiotomy via the subxiphoid approach, yielding 500 mL of serosanginous fluid.   Recommendations: Admit to ICU. Obtain STAT portable CXR. Follow-up fluid analyses, including cytology. Repeat echocardiogram tomorrow.  Anticipate removal of drain when output is less than 50-100 mL/24 hours and minimal residual fluid is present by echo.   Patient Profile     60 y.o. female with history of Graves' disease, tobacco use of 40+ years who presented with SOB  and was found to have a large pericardial effusion requiring emergent pericardiocentesis, as well as a right upper lobe mass suspicious for malignancy.   Assessment & Plan    1. 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 New onset Afib: -Currently maintaining sinus rhythm -We will transition her back to oral amiodarone given she is maintaining sinus rhythm, and at her  request -CHADS2VASc at least 1 (sex category) -Not currently on full dose anticoagulation   3. Lung mass/emphysema/small bilateral pleural effusions/tobacco use: -IV Lasix 60 mg daily -Continue to follow recommendations of CCM and oncology -Complete cessation of tobacco use has been advised  -She has declined chemotherapy, though indicates that she would consider immunotherapy if appropriate -Prognosis appears to be guarded to poor    For questions or updates, please contact [place] Please consult [email or web address] for contact info under Cardiology/STEMI.    Signed, Christell Faith, PA-C [place] Pager: [phone] 04/04/2021, 10:03 AM

## 2021-04-04 NOTE — Progress Notes (Signed)
   04/04/21 1400  Clinical Encounter Type  Visited With Patient and family together  Visit Type Spiritual support;Social support;Initial  Referral From [place] visited PT and daughter, who was at bedside. Chaplain gave space for daughter to express her feelings. [name] Daughter spoke of the struggle to rest at the moment. Chaplain explored different coping mechanism. Chaplain ministered with intentional presence, and reflective listening.

## 2021-04-04 NOTE — Progress Notes (Signed)
PROGRESS NOTE    Nichole Park  HUT:[id number] DOB: 1960/11/08 DOA: 03/27/2021 PCP: Pcp, No  Assessment & Plan:   Principal Problem:   Cardiac tamponade Active Problems:   Cardiac/pericardial tamponade   Malignant pericardial effusion (HCC)   Tobacco use   PAF (paroxysmal atrial fibrillation) (HCC)   COPD (chronic obstructive pulmonary disease) (HCC)   Lung mass   Palliative care encounter   Pericardial effusion: w/ cardiac tamponade. S/p pericardiocentesis with 400 ml drainage. Drain has since been removed. Cytology confirms malignancy, special stains are pending. Remains tachypnic and SOB. CTA neg.TTE shows preserved EF, diastolic dysfunction, no reaccumulation of fluid but pericardial fat vs malignancy, poss still tamponade.   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 Not candidate currently for EBUS with biopsy if/when patient stable, likely as outpt as per Dr. Patsey Berthold. Onco palliative care is following and recs apprec   Acute hypoxic respiratory failure: secondary to lung cancer w/ lymphangitic spread.  Continue on IV steroids and bronchodilators.    Likely PAF: w/ RVR. High risk for PE but anticoagulation is contraindicated. Continue on amio, lasix as per cardio   Thrombocytosis: etiology unclear. 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 Will continue to monitor   Leukocytosis: likely secondary to steroid use. Will continue to monitor   Acute cystitis: UA was suggestive of infection but urine cx neg. Abxs were d/c    Graves Disease: w/ hx diffuse goiter and elevated TSIs (in care everywhere). Hx rash w/ methimazole, stopped that several years ago and hasn't had f/u. Continue on  propylthiouricil & will monitor LFTs   Pre-DM: HbA1c 6.2. Will monitor BS w/ BMP daily      DVT prophylaxis: lovenox  Code Status: full  Family Communication: discussed pt's care w/ pt's family at  bedside and answered their questions  Disposition Plan: unclear   Level of care: Stepdown  Status is: Inpatient  Remains inpatient appropriate because:Ongoing diagnostic testing needed not appropriate for outpatient work up, Unsafe d/c plan, IV treatments appropriate due to intensity of illness or inability to take PO, and Inpatient level of care appropriate due to severity of illness  Dispo: The patient is from: Home              Anticipated d/c is to:  unclear              Patient currently is not medically stable to d/c.   Difficult to place patient Yes   Consultants:  Palliative care Onco Cardio Pulmon   Procedures:   Antimicrobials:    Subjective: Pt c/o shortness of breath   Objective: Vitals:   04/04/21 0200 04/04/21 0300 04/04/21 0400 04/04/21 0500  BP: 115/66 122/76 114/72 (!) 141/122  Pulse: 81 84 85 88  Resp: (!) 21 20 19  (!) 22  Temp:      TempSrc:      SpO2: 97% 95% 96% 92%  Weight:    61.4 kg  Height:        Intake/Output Summary (Last 24 hours) at 04/04/2021 0828 Last data filed at 04/04/2021 0600 Gross per 24 hour  Intake 424.46 ml  Output 2050 ml  Net -1625.54 ml   Filed Weights   04/02/21 0500 04/03/21 0500 04/04/21 0500  Weight: 62.4 kg 63.6 kg 61.4 kg    Examination:  General exam: Appears calm and comfortable  Respiratory system: diminished breath sounds b/l Cardiovascular system:  irregularly irregular. No rubs, gallops or clicks. Gastrointestinal system: Abdomen is nondistended, soft and nontender. Normal bowel sounds heard. Central nervous system: Alert and oriented. Moves all extremities  Psychiatry: Judgement and insight appear normal. Flat mood and affect     Data Reviewed: I have personally reviewed following labs and imaging studies  CBC: Recent Labs  Lab 03/29/21 0413 04/01/21 0617 04/02/21 0209 04/03/21 0327 04/04/21 0445  WBC 16.7* 17.7* 17.8* 15.2* 32.0*  NEUTROABS  --   --  12.6*  --   --   HGB 14.7 13.5 14.0  13.9 14.4  HCT 43.7 39.5 41.7 41.1 43.4  MCV 86.5 87.4 86.0 87.6 87.7  PLT 423* 350 440* 448* 947*   Basic Metabolic Panel: Recent Labs  Lab 03/30/21 0452 03/31/21 0444 04/01/21 0617 04/02/21 0209 04/03/21 0327  NA 132* 131* 130* 129* 131*  K 3.7 3.8 3.3* 3.5 4.2  CL 99 95* 90* 88* 92*  CO2 25 28 31  32 33*  GLUCOSE 166* 152* 145* 152* 173*  BUN 9 10 7 11 8   CREATININE 0.43* 0.48 0.46 0.52 0.47  CALCIUM 8.4* 8.5* 8.9 8.3* 8.8*  MG  --   --   --  2.1 2.4  PHOS  --   --   --  3.3 3.6   GFR: Estimated Creatinine Clearance: 66.1 mL/min (by C-G formula based on SCr of 0.47 mg/dL). Liver Function Tests: Recent Labs  Lab 04/02/21 0209 04/03/21 0327  AST 33 29  ALT 26 26  ALKPHOS 63 71  BILITOT 0.5 0.8  PROT 6.4* 6.4*  ALBUMIN 2.3* 2.2*   No results for input(s): LIPASE, AMYLASE in the last 168 hours. No results for input(s): AMMONIA in the last 168 hours. Coagulation Profile: No results for input(s): INR, PROTIME in the last 168 hours. Cardiac Enzymes: No results for input(s): CKTOTAL, CKMB, CKMBINDEX, TROPONINI in the last 168 hours. BNP (last 3 results) No results for input(s): PROBNP in the last 8760 hours. HbA1C: No results for input(s): HGBA1C in the last 72 hours. CBG: No results for input(s): GLUCAP in the last 168 hours. Lipid Profile: No results for input(s): CHOL, HDL, LDLCALC, TRIG, CHOLHDL, LDLDIRECT in the last 72 hours. Thyroid Function Tests: No results for input(s): TSH, T4TOTAL, FREET4, T3FREE, THYROIDAB in the last 72 hours. Anemia Panel: No results for input(s): VITAMINB12, FOLATE, FERRITIN, TIBC, IRON, RETICCTPCT in the last 72 hours. Sepsis Labs: Recent Labs  Lab 03/29/21 0413 03/30/21 0452  PROCALCITON <0.10 <0.10    Recent Results (from the past 240 hour(s))  Blood culture (routine single)     Status: None   Collection Time: 03/27/21  5:01 PM   Specimen: BLOOD  Result Value Ref Range Status   Specimen Description BLOOD RIGHT  ANTECUBITAL  Final   Special Requests   Final    BOTTLES DRAWN AEROBIC AND ANAEROBIC Blood Culture adequate volume   Culture   Final    NO GROWTH 6 DAYS Performed at [facility], [address] [place], [place] [address]    Report Status 04/02/2021 FINAL  Final  Resp Panel by RT-PCR (Flu A&B, Covid) Nasopharyngeal Swab     Status: None   Collection Time: 03/27/21  5:01 PM   Specimen: Nasopharyngeal Swab; Nasopharyngeal(NP) swabs in vial transport medium  Result Value Ref Range Status   SARS Coronavirus 2 by RT PCR NEGATIVE NEGATIVE Final    Comment: (NOTE) SARS-CoV-2 target nucleic acids are NOT DETECTED.  The SARS-CoV-2 RNA is generally detectable in  upper respiratory specimens during the acute phase of infection. The lowest concentration of SARS-CoV-2 viral copies this assay can detect is 138 copies/mL. A negative result does not preclude SARS-Cov-2 infection and should not be used as the sole basis for treatment or other patient management decisions. A negative result may occur with  improper specimen collection/handling, submission of specimen other than nasopharyngeal swab, presence of viral mutation(s) within the areas targeted by this assay, and inadequate number of viral copies(<138 copies/mL). A negative result must be combined with clinical observations, patient history, and epidemiological information. The expected result is Negative.  Fact Sheet for Patients:  EntrepreneurPulse.com.au  Fact Sheet for Healthcare Providers:  IncredibleEmployment.be  This test is no t yet approved or cleared by the [place] FDA and  has been authorized for detection and/or diagnosis of SARS-CoV-2 by FDA under an Emergency Use Authorization (EUA). This EUA will remain  in effect (meaning this test can be used) for the duration of the COVID-19 declaration under Section 564(b)(1) of the Act, 21 U.S.C.section 360bbb-3(b)(1), unless the  authorization is terminated  or revoked sooner.       Influenza A by PCR NEGATIVE NEGATIVE Final   Influenza B by PCR NEGATIVE NEGATIVE Final    Comment: (NOTE) The Xpert Xpress SARS-CoV-2/FLU/RSV plus assay is intended as an aid in the diagnosis of influenza from Nasopharyngeal swab specimens and should not be used as a sole basis for treatment. Nasal washings and aspirates are unacceptable for Xpert Xpress SARS-CoV-2/FLU/RSV testing.  Fact Sheet for Patients: EntrepreneurPulse.com.au  Fact Sheet for Healthcare Providers: IncredibleEmployment.be  This test is not yet approved or cleared by the [place] FDA and has been authorized for detection and/or diagnosis of SARS-CoV-2 by FDA under an Emergency Use Authorization (EUA). This EUA will remain in effect (meaning this test can be used) for the duration of the COVID-19 declaration under Section 564(b)(1) of the Act, 21 U.S.C. section 360bbb-3(b)(1), unless the authorization is terminated or revoked.  Performed at [facility], [address], [place], [place] [address]   Urine Culture     Status: Abnormal   Collection Time: 03/27/21  5:01 PM   Specimen: In/Out Cath Urine  Result Value Ref Range Status   Specimen Description   Final    IN/OUT CATH URINE Performed at [facility], [address], [place], [place] [address]    Special Requests   Final    NONE Performed at [facility], [address] [place], [place] [address]    Culture (A)  Final    <10,000 COLONIES/mL INSIGNIFICANT GROWTH Performed at [facility] [address] [place], [place] [address]    Report Status 03/29/2021 FINAL  Final  Blood culture (single)     Status: None   Collection Time: 03/27/21  6:05 PM   Specimen: BLOOD  Result Value Ref Range Status   Specimen Description BLOOD LEFT ANTECUBITAL  Final   Special Requests   Final    BOTTLES DRAWN AEROBIC AND  ANAEROBIC Blood Culture results may not be optimal due to an inadequate volume of blood received in culture bottles   Culture   Final    NO GROWTH 6 DAYS Performed at [facility], [address] [place],  [address]    Report Status 04/02/2021 FINAL  Final  Body fluid culture w Gram Stain     Status: None   Collection Time: 03/27/21  8:26 PM   Specimen: Pericardial; Body Fluid  Result Value Ref Range Status   Specimen Description   Final    PERICARDIAL Performed at [facility], [address] [place], [place] [address]    Special Requests   Final    NONE Performed at [facility], [place], [place] [address]    Gram Stain   Final    RARE WBC PRESENT,BOTH PMN AND MONONUCLEAR NO ORGANISMS SEEN    Culture   Final    NO GROWTH 3 DAYS Performed at [facility], [place] [address], [place], [place] [address]    Report Status 03/31/2021 FINAL  Final  Acid Fast Smear (AFB)     Status: None   Collection Time: 03/27/21  8:26 PM   Specimen: Pericardial Fluid  Result Value Ref Range Status   AFB Specimen Processing Concentration  Final   Acid Fast Smear Negative  Final    Comment: (NOTE) Performed At: [facility] [address] [place], [place] [id number] Rush Farmer MD OZ:[id number]    Source (AFB) FLUID  Final    Comment: Performed at [facility], [address] [place], [place] [address]  Culture, blood (routine x 2)     Status: None   Collection Time: 03/27/21 10:18 PM   Specimen: BLOOD RIGHT FOREARM  Result Value Ref Range Status   Specimen Description BLOOD RIGHT FOREARM  Final   Special Requests   Final    BOTTLES DRAWN AEROBIC AND ANAEROBIC Blood Culture adequate volume   Culture   Final    NO GROWTH 6 DAYS Performed at [facility], [address], [place], [place] [address]    Report Status 04/02/2021 FINAL  Final  MRSA Next Gen by PCR, Nasal     Status: None   Collection  Time: 03/27/21 11:01 PM   Specimen: Nasal Mucosa; Nasal Swab  Result Value Ref Range Status   MRSA by PCR Next Gen NOT DETECTED NOT DETECTED Final    Comment: (NOTE) The GeneXpert MRSA Assay (FDA approved for NASAL specimens only), is one component of a comprehensive MRSA colonization surveillance program. It is not intended to diagnose MRSA infection nor to guide or monitor treatment for MRSA infections. Test performance is not FDA approved in patients less than 32 years old. Performed at [facility], [address], [place], [place] [address]          Radiology Studies: [facility] Chest [place] 1 View  Result Date: 04/04/2021 CLINICAL DATA:  Respiratory failure. EXAM: PORTABLE CHEST 1 VIEW COMPARISON:  04/01/2021.  CT 03/27/2021. FINDINGS: Mediastinum and hilar structures are stable. Borderline cardiomegaly. No pulmonary venous congestion. Persistent large mass right upper lung again noted. Diffuse bilateral interstitial prominence again noted. Although interstitial edema and/or pneumonitis could present this fashion, lymphangitic tumor spread again cannot be excluded. Bibasilar atelectasis. Right base infiltrate. Right base pneumonia cannot be excluded. Small bilateral pleural effusions again noted. No acute bony abnormality identified. IMPRESSION: 1.  Large right upper lung mass again noted without interim change. 2. Diffuse bilateral interstitial prominence again noted. Although interstitial edema and/or pneumonitis could present in this fashion, lymphangitic tumor spread again cannot be excluded. 3. Bibasilar atelectasis. Right base infiltrate. Right base pneumonia cannot be excluded. Small bilateral pleural effusions again noted. 4.  Borderline cardiomegaly. Electronically Signed   By: Marcello Moores  Register   On: 04/04/2021 07:53   ECHOCARDIOGRAM LIMITED  Result Date: 04/02/2021    ECHOCARDIOGRAM LIMITED REPORT   Patient Name:   Nichole Park  Date of Exam: 04/02/2021 Medical Rec #:   [id number]   Height:       61.5 in Accession #:    [id number]  Weight:       137.6 lb Date of Birth:  1961-07-10  BSA:          1.621 m Patient Age:    31 years    BP:           106/77 mmHg Patient Gender: F           HR:           87 bpm. Exam Location:  [facility] Procedure: Limited Echo, Color Doppler and Cardiac Doppler Indications:     Pericardial Effusion I31.3  History:         Patient has prior history of Echocardiogram examinations, most                  recent 03/29/2021. COPD; Risk Factors:Hypertension.  Sonographer:     Sherrie Sport RDCS (AE) Referring Phys:  [id number] [place] Diagnosing Phys: Harrell Gave End MD  Sonographer Comments: Image acquisition challenging due to COPD. IMPRESSIONS  1. Left ventricular ejection fraction, by estimation, is >55%. The left ventricle has normal function.  2. Right ventricular systolic function is normal. Moderately increased right ventricular wall thickness.  3. Echogenic material in the pericardial space is noted, which could represent epicardial fat. However, in the setting of malignant pericardial effusion, tumor cannot be excluded. FINDINGS  Left Ventricle: Left ventricular ejection fraction, by estimation, is >55%. The left ventricle has normal function. The left ventricular internal cavity size was normal in size. There is borderline left ventricular hypertrophy. Right Ventricle: Moderately increased right ventricular wall thickness. Right ventricular systolic function is normal. Pericardium: Echogenic material in the pericardial space is noted, which could represent epicardial fat. However, in the setting of malignant pericardial effusion, tumor cannot be excluded. Trivial pericardial effusion is present. LEFT VENTRICLE PLAX 2D LVIDd:         4.23 cm LVIDs:         2.58 cm LV PW:         1.01 cm LV IVS:        1.01 cm LVOT diam:     2.00 cm LVOT Area:     3.14 cm  LEFT ATRIUM         Index LA diam:    3.60 cm 2.22 cm/m   AORTA Ao Root diam: 2.90 cm  SHUNTS  Systemic Diam: 2.00 cm Nelva Bush MD Electronically signed by Nelva Bush MD Signature Date/Time: 04/02/2021/12:36:25 PM    Final         Scheduled Meds:  amiodarone  400 mg Oral BID   arformoterol  15 mcg Nebulization BID   budesonide (PULMICORT) nebulizer solution  0.25 mg Nebulization BID   Chlorhexidine Gluconate Cloth  6 each Topical Q0600   enoxaparin (LOVENOX) injection  40 mg Subcutaneous Q24H   feeding supplement  237 mL Oral TID BM   furosemide  60 mg Intravenous Daily   mouth rinse  15 mL Mouth Rinse BID   methylPREDNISolone (SOLU-MEDROL) injection  40 mg Intravenous Q12H   multivitamin with minerals  1 tablet Oral Daily   nicotine  21 mg Transdermal Daily   nystatin  5 mL Oral QID   potassium chloride  40 mEq Oral Daily   propylthiouracil  100 mg Oral Q8H   revefenacin  175 mcg Nebulization Daily   sodium chloride  1 g Oral TID WC   Continuous Infusions:  sodium chloride Stopped (04/02/21 0226)   diltiazem (CARDIZEM) infusion Stopped (03/31/21 1628)     LOS: 8 days    Time spent: 35 mins    Wyvonnia Dusky, MD [facility] Pager [phone]  If 7PM-7AM, please contact night-coverage  04/04/2021, 8:28 AM

## 2021-04-04 NOTE — Plan of Care (Signed)
Patient was discussed during multidisciplinary rounds.  Stage IV malignancy likely adenocarcinoma.  Respiratory distress earlier in the week likely due to COPD exacerbation and pulmonary edema due to diastolic dysfunction.  Responded to diuretics.  COPD exacerbation managed with IV steroids and nebulized bronchodilators in the form of Brovana, Pulmicort and Yupelri.  Switch steroids to p.o. and taper over the next week.  Patient is not a candidate for general anesthesia and therefore bronchoscopy not an option at present.  Ideally she should get PET/CT as an outpatient to see if there is any other amenable biopsy site that would not require general anesthesia.  I did speak with IR today and the right supraclavicular node is only 1 cm in diameter and not a good target.  May consider liquid biopsy.  Ultimately patient's prognosis is very poor.  PCCM will sign off please reconsult as needed.  Renold Don, MD Advanced Bronchoscopy PCCM [facility]   *This note was dictated using voice recognition software/Dragon.  Despite best efforts to proofread, errors can occur which can change the meaning.  Any change was purely unintentional.

## 2021-04-04 NOTE — Progress Notes (Signed)
Neuro: A & O x4, moves in bed well Resp: stable on venti mask/[place]-cogestive, productive cough, not in significant distress CV: afebrile, HR fluctuates with activity, IV Amio off, transitioned to PO Amio GIGU: purewick in place, no BM, tolerating PO well Skin: Clean, dry and intact Social: Daughter and significant other at the bedside throughout the day, all questions and concerns addressed.  Significant conversations had with patient and daughter today involving goals of care and code status. They both had questions and needed clarification regarding patient's status and prognosis. Also needed clarification on code status and what that meant. Expressed understanding, showing signs of difficulty with acceptance though the patient decided she needs to call and notify some close friends and family members of her illness. Awaiting notary this evening to complete living will and durable power of attorney.

## 2021-04-04 NOTE — Progress Notes (Signed)
Nichole Park   DOB:[birth year]   HW#:[id number]    Subjective: Patient currently in stepdown/ICU.  Patient currently off pressors; patient states that she did not have to use BiPAP overnight.  Her breathing is improved.  Well resting; on room air oxygen levels around 90-92.  She feels overall much improved since the admission.  Objective:  Vitals:   04/04/21 1900 04/04/21 2000  BP: (!) 145/81 (!) 164/94  Pulse: 94 90  Resp: (!) 26 (!) 21  Temp:  98 F (36.7 C)  SpO2: 98% 97%     Intake/Output Summary (Last 24 hours) at 04/04/2021 2239 Last data filed at 04/04/2021 2000 Gross per 24 hour  Intake 248.74 ml  Output 2225 ml  Net -1976.26 ml    Physical Exam Vitals and nursing note reviewed.  Constitutional:      Comments: Patient resting in the bed; Nasal cannula oxygen: 3-4 lits.  Accompanied by significant other  HENT:     Head: Normocephalic and atraumatic.     Mouth/Throat:     Mouth: Mucous membranes are moist.     Pharynx: No oropharyngeal exudate.  Eyes:     Pupils: Pupils are equal, round, and reactive to light.  Cardiovascular:     Rate and Rhythm: Normal rate. Rhythm irregular.  Pulmonary:     Effort: No respiratory distress.     Breath sounds: No wheezing.     Comments: Decreased breath sounds bilaterally at bases.   Abdominal:     General: Bowel sounds are normal. There is no distension.     Palpations: Abdomen is soft. There is no mass.     Tenderness: There is no abdominal tenderness. There is no guarding or rebound.  Musculoskeletal:        General: No tenderness. Normal range of motion.     Cervical back: Normal range of motion and neck supple.  Skin:    General: Skin is warm.  Neurological:     Mental Status: She is alert and oriented to person, place, and time.  Psychiatric:        Mood and Affect: Affect normal.        Judgment: Judgment normal.     Labs:  Lab Results  Component Value Date   WBC 32.0 (H) 04/04/2021   HGB 14.4 04/04/2021   HCT  43.4 04/04/2021   MCV 87.7 04/04/2021   PLT 549 (H) 04/04/2021   NEUTROABS 12.6 (H) 04/02/2021    Lab Results  Component Value Date   NA 131 (L) 04/03/2021   K 4.2 04/03/2021   CL 92 (L) 04/03/2021   CO2 33 (H) 04/03/2021    Studies:  DG Chest Port 1 View  Result Date: 04/04/2021 CLINICAL DATA:  Respiratory failure. EXAM: PORTABLE CHEST 1 VIEW COMPARISON:  04/01/2021.  CT 03/27/2021. FINDINGS: Mediastinum and hilar structures are stable. Borderline cardiomegaly. No pulmonary venous congestion. Persistent large mass right upper lung again noted. Diffuse bilateral interstitial prominence again noted. Although interstitial edema and/or pneumonitis could present this fashion, lymphangitic tumor spread again cannot be excluded. Bibasilar atelectasis. Right base infiltrate. Right base pneumonia cannot be excluded. Small bilateral pleural effusions again noted. No acute bony abnormality identified. IMPRESSION: 1.  Large right upper lung mass again noted without interim change. 2. Diffuse bilateral interstitial prominence again noted. Although interstitial edema and/or pneumonitis could present in this fashion, lymphangitic tumor spread again cannot be excluded. 3. Bibasilar atelectasis. Right base infiltrate. Right base pneumonia cannot be excluded. Small bilateral pleural effusions  again noted. 4.  Borderline cardiomegaly. Electronically Signed   By: Marcello Moores  Register   On: 04/04/2021 07:53    Malignant pericardial effusion [facility]) #60 year old female patient with history of smoking currently admitted to hospital for pericardial tamponade/malignant pericardial effusion/acute respiratory failure  # Malignant pericardial tamponade-s/p pericardiocentesis; cytology positive for malignancy; however immunohistochemistry-pending.  If immunohistochemistry inconclusive-we will consider PET scan outpatient/tissue biopsy/liquid biopsy.  I suspect patient clinically does not have any of the novel mutations.    #Acute respiratory failure-multifactorial-malignancy/lymphangitic spread/COPD.-Improved with steroids/bronchodilators/diuretics.  #Leukocytosis White count 32 ~likely secondary to steroids.   #Plan/prognosis: I again reviewed with the patient regarding the serious diagnosis/stage IV lung cancer which is incurable.  However if patient is clinically stable-from cardiorespiratory standpoint systemic chemotherapy could be offered.  The options include chemoimmunotherapy-in clinical trial population-response rates up to 60 to 70%; whereas single agent immunotherapy [again based on PD-L1 status]-anywhere between 20 to 50%.  Given the lymphangitic spread/pericardial effusion-I would recommend chemoimmunotherapy if patient chooses to proceed with systemic therapy.  However patient is not interested in chemotherapy [given previous experience to sister who had serious side effects of chemotherapy almost 15-20 years ago.].  Again I reiterated with the patient that supportive care has improved over time/and I think benefits of chemoimmunotherapy outweigh the risk.  However, the final decision is up to the patient/family with regards to their choice of systemic therapy.  If the patient chooses- we will consider systemic therapy in the hospital cycle #1 prior to discharge.   Cammie Sickle, MD 04/04/2021  10:39 PM

## 2021-04-05 DIAGNOSIS — J432 Centrilobular emphysema: Secondary | ICD-10-CM

## 2021-04-05 DIAGNOSIS — J9621 Acute and chronic respiratory failure with hypoxia: Secondary | ICD-10-CM

## 2021-04-05 DIAGNOSIS — J9622 Acute and chronic respiratory failure with hypercapnia: Secondary | ICD-10-CM

## 2021-04-05 LAB — CBC
HCT: 40.8 % (ref 36.0–46.0)
Hemoglobin: 13.7 g/dL (ref 12.0–15.0)
MCH: 30 pg (ref 26.0–34.0)
MCHC: 33.6 g/dL (ref 30.0–36.0)
MCV: 89.3 fL (ref 80.0–100.0)
Platelets: 524 10*3/uL — ABNORMAL HIGH (ref 150–400)
RBC: 4.57 MIL/uL (ref 3.87–5.11)
RDW: 12.6 % (ref 11.5–15.5)
WBC: 28.7 10*3/uL — ABNORMAL HIGH (ref 4.0–10.5)
nRBC: 0 % (ref 0.0–0.2)

## 2021-04-05 LAB — MAGNESIUM: Magnesium: 2.2 mg/dL (ref 1.7–2.4)

## 2021-04-05 LAB — BASIC METABOLIC PANEL
Anion gap: 10 (ref 5–15)
BUN: 20 mg/dL (ref 6–20)
CO2: 29 mmol/L (ref 22–32)
Calcium: 9 mg/dL (ref 8.9–10.3)
Chloride: 95 mmol/L — ABNORMAL LOW (ref 98–111)
Creatinine, Ser: 0.64 mg/dL (ref 0.44–1.00)
GFR, Estimated: >60 mL/min (ref >60)
Glucose, Bld: 196 mg/dL — ABNORMAL HIGH (ref 70–99)
Potassium: 4.4 mmol/L (ref 3.5–5.1)
Sodium: 134 mmol/L — ABNORMAL LOW (ref 135–145)

## 2021-04-05 LAB — CYTOLOGY - NON PAP

## 2021-04-05 MED ORDER — POLYETHYLENE GLYCOL 3350 17 G PO PACK
17.0000 g | PACK | Freq: Every day | ORAL | Status: DC
  Administered 2021-04-05 – 2021-04-09 (×2): 17 g via ORAL
  Filled 2021-04-05 (×5): qty 1

## 2021-04-05 MED ORDER — BOOST / RESOURCE BREEZE PO LIQD CUSTOM
1.0000 | Freq: Three times a day (TID) | ORAL | Status: DC
  Administered 2021-04-05 – 2021-04-09 (×4): 1 via ORAL

## 2021-04-05 MED ORDER — DOCUSATE SODIUM 100 MG PO CAPS
200.0000 mg | ORAL_CAPSULE | Freq: Two times a day (BID) | ORAL | Status: DC
  Administered 2021-04-05 – 2021-04-09 (×7): 200 mg via ORAL
  Filled 2021-04-05 (×8): qty 2

## 2021-04-05 MED ORDER — DILTIAZEM HCL 30 MG PO TABS
60.0000 mg | ORAL_TABLET | Freq: Four times a day (QID) | ORAL | Status: AC
  Administered 2021-04-05 – 2021-04-07 (×7): 60 mg via ORAL
  Filled 2021-04-05 (×7): qty 2

## 2021-04-05 MED ORDER — BISACODYL 5 MG PO TBEC: 10.0000 mg | DELAYED_RELEASE_TABLET | Freq: Every day | ORAL | Status: DC | PRN

## 2021-04-05 NOTE — Progress Notes (Signed)
PROGRESS NOTE    Nichole Park  TWS:[id number] DOB: [birth year] DOA: 03/27/2021 PCP: Pcp, No  Assessment & Plan:   Principal Problem:   Cardiac tamponade Active Problems:   Cardiac/pericardial tamponade   Malignant pericardial effusion (HCC)   Tobacco use   PAF (paroxysmal atrial fibrillation) (HCC)   COPD (chronic obstructive pulmonary disease) (HCC)   Lung mass   Palliative care encounter   Pericardial effusion: w/ cardiac tamponade. S/p pericardiocentesis with 400 ml drainage. Drain has since been removed. Cytology confirms malignancy, special stains are pending. Remains tachypnic and SOB. Continue on IV lasix. CTA neg. TTE shows preserved EF, diastolic dysfunction, no reaccumulation of fluid but pericardial fat vs malignancy, poss still tamponade.   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 Final pathology is pending still. Not candidate currently for EBUS with biopsy if/when patient stable, likely as outpt as per Dr. Patsey Berthold. Chemoimmunotherapy is recommended by onco but pt is still deciding if she wants to receive treatment or not. Oncology & onco palliative care is following and recs apprec   Acute hypoxic respiratory failure: secondary to lung cancer w/ lymphangitic spread.  Continue on IV steroids and bronchodilators.    Likely PAF: w/ RVR. High risk for PE but anticoagulation is contraindicated. Continue on amio and started on diltiazem as per cardio   Thrombocytosis: labile, etiology unclear.    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  Leukocytosis: likely secondary to steroid use.   Acute cystitis: UA was suggestive of infection but urine cx neg. Abxs were d/c    Graves Disease: w/ hx diffuse goiter and elevated TSIs (in care everywhere). Hx rash w/ methimazole, stopped that several years ago and hasn't had f/u. Continue on propylthiouricil & will monitor LFTs   Pre-DM: HbA1c 6.2. Will  monitor BS w/ BMP daily      DVT prophylaxis: lovenox  Code Status: full  Family Communication:  Disposition Plan: unclear   Level of care: Stepdown  Status is: Inpatient  Remains inpatient appropriate because:Ongoing diagnostic testing needed not appropriate for outpatient work up, Unsafe d/c plan, IV treatments appropriate due to intensity of illness or inability to take PO, and Inpatient level of care appropriate due to severity of illness  Dispo: The patient is from: Home              Anticipated d/c is to:  unclear              Patient currently is not medically stable to d/c.   Difficult to place patient Yes   Consultants:  Palliative care Onco Cardio Pulmon   Procedures:   Antimicrobials:    Subjective: Pt c/o malaise   Objective: Vitals:   04/05/21 0500 04/05/21 0600 04/05/21 0700 04/05/21 0730  BP: 135/81 (!) 166/92 136/72   Pulse: 78 83 79   Resp:  (!) 21 19   Temp:      TempSrc:      SpO2: 94% 96% 94% 94%  Weight:      Height:        Intake/Output Summary (Last 24 hours) at 04/05/2021 0841 Last data filed at 04/05/2021 0400 Gross per 24 hour  Intake 48.74 ml  Output 1950 ml  Net -1901.26 ml   Filed Weights   04/03/21 0500 04/04/21 0500 04/05/21 0345  Weight: 63.6 kg 61.4 kg 62 kg    Examination:  General exam: Appears comfortable  Respiratory system: decreased  breath sounds b/l Cardiovascular system: S1/S2+. No clicks or rubs  Gastrointestinal system: Abd is soft, NT, ND & hypoactive bowel sounds  Central nervous system: Alert and oriented. Moves all extremities Psychiatry: Judgement and insight appear normal. Flat mood and affect    Data Reviewed: I have personally reviewed following labs and imaging studies  CBC: Recent Labs  Lab 04/01/21 0617 04/02/21 0209 04/03/21 0327 04/04/21 0445 04/05/21 0549  WBC 17.7* 17.8* 15.2* 32.0* 28.7*  NEUTROABS  --  12.6*  --   --   --   HGB 13.5 14.0 13.9 14.4 13.7  HCT 39.5 41.7 41.1 43.4  40.8  MCV 87.4 86.0 87.6 87.7 89.3  PLT 350 440* 448* 549* 672*   Basic Metabolic Panel: Recent Labs  Lab 03/31/21 0444 04/01/21 0617 04/02/21 0209 04/03/21 0327 04/05/21 0549  NA 131* 130* 129* 131* 134*  K 3.8 3.3* 3.5 4.2 4.4  CL 95* 90* 88* 92* 95*  CO2 28 31 32 33* 29  GLUCOSE 152* 145* 152* 173* 196*  BUN 10 7 11 8 20   CREATININE 0.48 0.46 0.52 0.47 0.64  CALCIUM 8.5* 8.9 8.3* 8.8* 9.0  MG  --   --  2.1 2.4 2.2  PHOS  --   --  3.3 3.6  --    GFR: Estimated Creatinine Clearance: 66.4 mL/min (by C-G formula based on SCr of 0.64 mg/dL). Liver Function Tests: Recent Labs  Lab 04/02/21 0209 04/03/21 0327  AST 33 29  ALT 26 26  ALKPHOS 63 71  BILITOT 0.5 0.8  PROT 6.4* 6.4*  ALBUMIN 2.3* 2.2*   No results for input(s): LIPASE, AMYLASE in the last 168 hours. No results for input(s): AMMONIA in the last 168 hours. Coagulation Profile: No results for input(s): INR, PROTIME in the last 168 hours. Cardiac Enzymes: No results for input(s): CKTOTAL, CKMB, CKMBINDEX, TROPONINI in the last 168 hours. BNP (last 3 results) No results for input(s): PROBNP in the last 8760 hours. HbA1C: No results for input(s): HGBA1C in the last 72 hours. CBG: No results for input(s): GLUCAP in the last 168 hours. Lipid Profile: No results for input(s): CHOL, HDL, LDLCALC, TRIG, CHOLHDL, LDLDIRECT in the last 72 hours. Thyroid Function Tests: No results for input(s): TSH, T4TOTAL, FREET4, T3FREE, THYROIDAB in the last 72 hours. Anemia Panel: No results for input(s): VITAMINB12, FOLATE, FERRITIN, TIBC, IRON, RETICCTPCT in the last 72 hours. Sepsis Labs: Recent Labs  Lab 03/30/21 0452  PROCALCITON <0.10    Recent Results (from the past 240 hour(s))  Blood culture (routine single)     Status: None   Collection Time: 03/27/21  5:01 PM   Specimen: BLOOD  Result Value Ref Range Status   Specimen Description BLOOD RIGHT ANTECUBITAL  Final   Special Requests   Final    BOTTLES DRAWN  AEROBIC AND ANAEROBIC Blood Culture adequate volume   Culture   Final    NO GROWTH 6 DAYS Performed at [facility], [address] [place], [place] [address]    Report Status 04/02/2021 FINAL  Final  Resp Panel by RT-PCR (Flu A&B, Covid) Nasopharyngeal Swab     Status: None   Collection Time: 03/27/21  5:01 PM   Specimen: Nasopharyngeal Swab; Nasopharyngeal(NP) swabs in vial transport medium  Result Value Ref Range Status   SARS Coronavirus 2 by RT PCR NEGATIVE NEGATIVE Final    Comment: (NOTE) SARS-CoV-2 target nucleic acids are NOT DETECTED.  The SARS-CoV-2 RNA is generally detectable in upper respiratory specimens  during the acute phase of infection. The lowest concentration of SARS-CoV-2 viral copies this assay can detect is 138 copies/mL. A negative result does not preclude SARS-Cov-2 infection and should not be used as the sole basis for treatment or other patient management decisions. A negative result may occur with  improper specimen collection/handling, submission of specimen other than nasopharyngeal swab, presence of viral mutation(s) within the areas targeted by this assay, and inadequate number of viral copies(<138 copies/mL). A negative result must be combined with clinical observations, patient history, and epidemiological information. The expected result is Negative.  Fact Sheet for Patients:  EntrepreneurPulse.com.au  Fact Sheet for Healthcare Providers:  IncredibleEmployment.be  This test is no t yet approved or cleared by the [place] FDA and  has been authorized for detection and/or diagnosis of SARS-CoV-2 by FDA under an Emergency Use Authorization (EUA). This EUA will remain  in effect (meaning this test can be used) for the duration of the COVID-19 declaration under Section 564(b)(1) of the Act, 21 U.S.C.section 360bbb-3(b)(1), unless the authorization is terminated  or revoked sooner.        Influenza A by PCR NEGATIVE NEGATIVE Final   Influenza B by PCR NEGATIVE NEGATIVE Final    Comment: (NOTE) The Xpert Xpress SARS-CoV-2/FLU/RSV plus assay is intended as an aid in the diagnosis of influenza from Nasopharyngeal swab specimens and should not be used as a sole basis for treatment. Nasal washings and aspirates are unacceptable for Xpert Xpress SARS-CoV-2/FLU/RSV testing.  Fact Sheet for Patients: EntrepreneurPulse.com.au  Fact Sheet for Healthcare Providers: IncredibleEmployment.be  This test is not yet approved or cleared by the [place] FDA and has been authorized for detection and/or diagnosis of SARS-CoV-2 by FDA under an Emergency Use Authorization (EUA). This EUA will remain in effect (meaning this test can be used) for the duration of the COVID-19 declaration under Section 564(b)(1) of the Act, 21 U.S.C. section 360bbb-3(b)(1), unless the authorization is terminated or revoked.  Performed at [facility], [address], [place], [place] [address]   Urine Culture     Status: Abnormal   Collection Time: 03/27/21  5:01 PM   Specimen: In/Out Cath Urine  Result Value Ref Range Status   Specimen Description   Final    IN/OUT CATH URINE Performed at [facility], [address] [place], [place] [address]    Special Requests   Final    NONE Performed at [facility], [address] [place], [place] [address]    Culture (A)  Final    <10,000 COLONIES/mL INSIGNIFICANT GROWTH Performed at [place] [address] [place], [place] [address]    Report Status 03/29/2021 FINAL  Final  Blood culture (single)     Status: None   Collection Time: 03/27/21  6:05 PM   Specimen: BLOOD  Result Value Ref Range Status   Specimen Description BLOOD LEFT ANTECUBITAL  Final   Special Requests   Final    BOTTLES DRAWN AEROBIC AND ANAEROBIC Blood Culture results may not be optimal due to an  inadequate volume of blood received in culture bottles   Culture   Final    NO GROWTH 6 DAYS Performed at [facility], [address] [place], [place] [address]    Report Status 04/02/2021 FINAL  Final  Body fluid culture w Gram Stain     Status: None   Collection Time: 03/27/21  8:26 PM   Specimen: Pericardial; Body Fluid  Result Value Ref  Range Status   Specimen Description   Final    PERICARDIAL Performed at [facility], [address] [place], [place] [address]    Special Requests   Final    NONE Performed at [facility], [place], [place] [address]    Gram Stain   Final    RARE WBC PRESENT,BOTH PMN AND MONONUCLEAR NO ORGANISMS SEEN    Culture   Final    NO GROWTH 3 DAYS Performed at [facility], [place] [address], [place], [place] [address]    Report Status 03/31/2021 FINAL  Final  Acid Fast Smear (AFB)     Status: None   Collection Time: 03/27/21  8:26 PM   Specimen: Pericardial Fluid  Result Value Ref Range Status   AFB Specimen Processing Concentration  Final   Acid Fast Smear Negative  Final    Comment: (NOTE) Performed At: [facility] [address] [place], [place] [id number] Rush Farmer MD EX:[id number]    Source (AFB) FLUID  Final    Comment: Performed at [facility], [address] [place], [place] [address]  Culture, blood (routine x 2)     Status: None   Collection Time: 03/27/21 10:18 PM   Specimen: BLOOD RIGHT FOREARM  Result Value Ref Range Status   Specimen Description BLOOD RIGHT FOREARM  Final   Special Requests   Final    BOTTLES DRAWN AEROBIC AND ANAEROBIC Blood Culture adequate volume   Culture   Final    NO GROWTH 6 DAYS Performed at [facility], [address] [place], [place] [address]    Report Status 04/02/2021 FINAL  Final  MRSA Next Gen by PCR, Nasal     Status: None   Collection Time: 03/27/21 11:01 PM   Specimen: Nasal Mucosa; Nasal Swab   Result Value Ref Range Status   MRSA by PCR Next Gen NOT DETECTED NOT DETECTED Final    Comment: (NOTE) The GeneXpert MRSA Assay (FDA approved for NASAL specimens only), is one component of a comprehensive MRSA colonization surveillance program. It is not intended to diagnose MRSA infection nor to guide or monitor treatment for MRSA infections. Test performance is not FDA approved in patients less than 24 years old. Performed at [facility], [address] [place], [place] [address]          Radiology Studies: [facility] Chest [place] 1 View  Result Date: 04/04/2021 CLINICAL DATA:  Respiratory failure. EXAM: PORTABLE CHEST 1 VIEW COMPARISON:  04/01/2021.  CT 03/27/2021. FINDINGS: Mediastinum and hilar structures are stable. Borderline cardiomegaly. No pulmonary venous congestion. Persistent large mass right upper lung again noted. Diffuse bilateral interstitial prominence again noted. Although interstitial edema and/or pneumonitis could present this fashion, lymphangitic tumor spread again cannot be excluded. Bibasilar atelectasis. Right base infiltrate. Right base pneumonia cannot be excluded. Small bilateral pleural effusions again noted. No acute bony abnormality identified. IMPRESSION: 1.  Large right upper lung mass again noted without interim change. 2. Diffuse bilateral interstitial prominence again noted. Although interstitial edema and/or pneumonitis could present in this fashion, lymphangitic tumor spread again cannot be excluded. 3. Bibasilar atelectasis. Right base infiltrate. Right base pneumonia cannot be excluded. Small bilateral pleural effusions again noted. 4.  Borderline cardiomegaly. Electronically Signed   By: Marcello Moores  Register   On: 04/04/2021 07:53        Scheduled Meds:  amiodarone  400 mg Oral BID   arformoterol  15 mcg Nebulization BID  budesonide (PULMICORT) nebulizer solution  0.25 mg Nebulization BID   Chlorhexidine Gluconate Cloth  6 each Topical Q0600    enoxaparin (LOVENOX) injection  40 mg Subcutaneous Q24H   feeding supplement  237 mL Oral TID BM   furosemide  60 mg Intravenous Daily   mouth rinse  15 mL Mouth Rinse BID   methylPREDNISolone (SOLU-MEDROL) injection  40 mg Intravenous Q12H   multivitamin with minerals  1 tablet Oral Daily   nicotine  21 mg Transdermal Daily   nystatin  5 mL Oral QID   potassium chloride  40 mEq Oral Daily   propylthiouracil  100 mg Oral Q8H   revefenacin  175 mcg Nebulization Daily   sodium chloride  1 g Oral TID WC   Continuous Infusions:  sodium chloride Stopped (04/02/21 0226)     LOS: 9 days    Time spent: 33 mins    Wyvonnia Dusky, MD [facility] Pager [phone]  If 7PM-7AM, please contact night-coverage  04/05/2021, 8:41 AM

## 2021-04-05 NOTE — TOC Progression Note (Signed)
Transition of Care [facility]) - Progression Note    Patient Details  Name: Nichole Park MRN: [id number] Date of Birth: [birth year]  Transition of Care [facility]) CM/SW [place], [place] Phone Number: [phone] 04/05/2021, 4:10 PM  Clinical Narrative:     Patient remains critically ill, w/  Lung cancer: stage 4 adenocarcinoma.  Palliative care is following and is assisting patient with determining goals of care. Patient's daughter [name] [phone] was working on getting Durable/Medical POA paperwork in order.      Expected Discharge Plan: [place] Barriers to Discharge: Continued Medical Work up  Expected Discharge Plan and Services Expected Discharge Plan: [place] arrangements for the past 2 months: Single Family Home                                       Social Determinants of Health (SDOH) Interventions    Readmission Risk Interventions No flowsheet data found.

## 2021-04-05 NOTE — Progress Notes (Signed)
Progress Note  Nichole Park Name: Nichole Park Date of Encounter: 04/05/2021  [facility] Cardiologist: [facility]  Subjective   No complaints this morning, resting comfortably supine Discussed with nursing, telemetry reviewed Past 24 to 48 hours continued episodes paroxysmal tachycardia rate 130 bpm and higher Transition to oral amiodarone yesterday receiving 400 twice daily In atrial fibrillation on rounds  Inpatient Medications    Scheduled Meds:  amiodarone  400 mg Oral BID   arformoterol  15 mcg Nebulization BID   budesonide (PULMICORT) nebulizer solution  0.25 mg Nebulization BID   Chlorhexidine Gluconate Cloth  6 each Topical Q0600   diltiazem  60 mg Oral Q6H   docusate sodium  200 mg Oral BID   enoxaparin (LOVENOX) injection  40 mg Subcutaneous Q24H   feeding supplement  1 Container Oral TID BM   furosemide  60 mg Intravenous Daily   mouth rinse  15 mL Mouth Rinse BID   methylPREDNISolone (SOLU-MEDROL) injection  40 mg Intravenous Q12H   multivitamin with minerals  1 tablet Oral Daily   nicotine  21 mg Transdermal Daily   nystatin  5 mL Oral QID   polyethylene glycol  17 g Oral Daily   potassium chloride  40 mEq Oral Daily   propylthiouracil  100 mg Oral Q8H   revefenacin  175 mcg Nebulization Daily   sodium chloride  1 g Oral TID WC   Continuous Infusions:  sodium chloride Stopped (04/02/21 0226)   PRN Meds: acetaminophen, bisacodyl, chlorpheniramine-HYDROcodone, guaiFENesin, guaiFENesin-dextromethorphan, levalbuterol, LORazepam, morphine injection   Vital Signs    Vitals:   04/05/21 1400 04/05/21 1500 04/05/21 1600 04/05/21 1705  BP: 119/72 117/69 120/69 124/75  Pulse: 83 84 81   Resp: 20 19    Temp:   98.2 F (36.8 C)   TempSrc:   Oral   SpO2: 94% 95% 93%   Weight:      Height:        Intake/Output Summary (Last 24 hours) at 04/05/2021 1805 Last data filed at 04/05/2021 1209 Gross per 24 hour  Intake 120 ml  Output 3300 ml  Net -3180 ml   Last 3  Weights 04/05/2021 04/04/2021 04/03/2021  Weight (lbs) 136 lb 11 oz 135 lb 5.8 oz 140 lb 3.4 oz  Weight (kg) 62 kg 61.4 kg 63.6 kg      Telemetry    Atrial fibrillation with RVR rate 130 bpm- Personally Reviewed  ECG    - Personally Reviewed  Physical Exam   GEN: No acute distress.   Neck: No JVD Cardiac: RRR, no murmurs, rubs, or gallops.  Respiratory: Clear to auscultation bilaterally. GI: Soft, nontender, non-distended  MS: No edema; No deformity. Neuro:  Nonfocal  Psych: Normal affect   Labs    High Sensitivity Troponin:  No results for input(s): TROPONINIHS in the last 720 hours.    Chemistry Recent Labs  Lab 04/02/21 0209 04/03/21 0327 04/05/21 0549  NA 129* 131* 134*  K 3.5 4.2 4.4  CL 88* 92* 95*  CO2 32 33* 29  GLUCOSE 152* 173* 196*  BUN 11 8 20   CREATININE 0.52 0.47 0.64  CALCIUM 8.3* 8.8* 9.0  PROT 6.4* 6.4*  --   ALBUMIN 2.3* 2.2*  --   AST 33 29  --   ALT 26 26  --   ALKPHOS 63 71  --   BILITOT 0.5 0.8  --   GFRNONAA >60 >60 >60  ANIONGAP 9 6 10      Hematology  Recent Labs  Lab 04/03/21 0327 04/04/21 0445 04/05/21 0549  WBC 15.2* 32.0* 28.7*  RBC 4.69 4.95 4.57  HGB 13.9 14.4 13.7  HCT 41.1 43.4 40.8  MCV 87.6 87.7 89.3  MCH 29.6 29.1 30.0  MCHC 33.8 33.2 33.6  RDW 12.6 12.5 12.6  PLT 448* 549* 524*    BNP Recent Labs  Lab 04/03/21 0327  BNP 382.5*     DDimer No results for input(s): DDIMER in the last 168 hours.   Radiology    DG Chest Port 1 View  Result Date: 04/04/2021 CLINICAL DATA:  Respiratory failure. EXAM: PORTABLE CHEST 1 VIEW COMPARISON:  04/01/2021.  CT 03/27/2021. FINDINGS: Mediastinum and hilar structures are stable. Borderline cardiomegaly. No pulmonary venous congestion. Persistent large mass right upper lung again noted. Diffuse bilateral interstitial prominence again noted. Although interstitial edema and/or pneumonitis could present this fashion, lymphangitic tumor spread again cannot be excluded. Bibasilar  atelectasis. Right base infiltrate. Right base pneumonia cannot be excluded. Small bilateral pleural effusions again noted. No acute bony abnormality identified. IMPRESSION: 1.  Large right upper lung mass again noted without interim change. 2. Diffuse bilateral interstitial prominence again noted. Although interstitial edema and/or pneumonitis could present in this fashion, lymphangitic tumor spread again cannot be excluded. 3. Bibasilar atelectasis. Right base infiltrate. Right base pneumonia cannot be excluded. Small bilateral pleural effusions again noted. 4.  Borderline cardiomegaly. Electronically Signed   By: Marcello Moores  Register   On: 04/04/2021 07:53    Cardiac Studies   Echo April 02, 2021  1. Left ventricular ejection fraction, by estimation, is >55%. The left  ventricle has normal function.   2. Right ventricular systolic function is normal. Moderately increased  right ventricular wall thickness.   3. Echogenic material in the pericardial space is noted, which could  represent epicardial fat. However, in the setting of malignant pericardial  effusion, tumor cannot be excluded.   Nichole Park Profile     60 y.o. female with history of Graves' disease, tobacco use of 40+ years who presented with SOB and was found to have a large pericardial effusion requiring emergent pericardiocentesis, as well as a right upper lobe mass suspicious for malignancy.  Assessment & Plan   1. Pericardial effusion and cardiac tamponade: -Status post successful emergent pericardiocentesis on 03/27/2021 with removal of 500 mL of serosanguinous fluid with cytology consistent with malignancy of unknown primary source at this time with immunohistochemical stains pending -Pericardial drain removed  Repeat echocardiograms no significant reaccumulation of effusion   2.  Paroxysmal atrial fibrillation  Continues to have frequent episodes of paroxysmal atrial fibrillation lasting 1 hour or more Atrial fibrillation on  rounds today 130 bpm and higher Continue amiodarone 400 twice daily Will add diltiazem 60 mg every 6 hours as blood pressure has stabilized This can be consolidated tomorrow to diltiazem extended release 240 daily up to 300 mg daily if blood pressure stable -CHADS2VASc at least 1 (sex category) -Not currently on full dose anticoagulation   3. Lung mass/emphysema/small bilateral pleural effusions/tobacco use: Management per oncology  4.  Acute diastolic CHF secondary to atrial fibrillation -IV Lasix 60 mg daily, continue for now but low threshold to stop for any further climbing BUN/creatinine at which point would change to Lasix 20 mg daily oral dosing  Discussed with Nichole Park, hospital service, nursing, plan as above  Total encounter time more than 35 minutes  Greater than 50% was spent in counseling and coordination of care with the Nichole Park   For questions or  updates, please contact [place] Please consult [email or web address] for contact info under        Signed, Ida Rogue, MD  04/05/2021, 6:05 PM

## 2021-04-05 NOTE — Progress Notes (Signed)
Nichole Park   DOB:28-Apr-1961   IH#:[id number]    Subjective: Patient currently on A. fib continue amiodarone.  Still needing BiPAP at night.  Short of breath off oxygen.  Patient is unable to get out of the bed.  Objective:  Vitals:   04/05/21 2144 04/05/21 2200  BP:  134/77  Pulse:  79  Resp:  16  Temp:    SpO2: 97% 97%     Intake/Output Summary (Last 24 hours) at 04/05/2021 2244 Last data filed at 04/05/2021 1209 Gross per 24 hour  Intake 120 ml  Output 3050 ml  Net -2930 ml    Physical Exam Vitals and nursing note reviewed.  Constitutional:      Comments: Patient resting in the bed; Nasal cannula oxygen: 3-4 lits.  Accompanied by significant other  HENT:     Head: Normocephalic and atraumatic.     Mouth/Throat:     Mouth: Mucous membranes are moist.     Pharynx: No oropharyngeal exudate.  Eyes:     Pupils: Pupils are equal, round, and reactive to light.  Cardiovascular:     Rate and Rhythm: Normal rate. Rhythm irregular.  Pulmonary:     Effort: No respiratory distress.     Breath sounds: No wheezing.     Comments: Decreased breath sounds bilaterally at bases.   Abdominal:     General: Bowel sounds are normal. There is no distension.     Palpations: Abdomen is soft. There is no mass.     Tenderness: There is no abdominal tenderness. There is no guarding or rebound.  Musculoskeletal:        General: No tenderness. Normal range of motion.     Cervical back: Normal range of motion and neck supple.  Skin:    General: Skin is warm.  Neurological:     Mental Status: She is alert and oriented to person, place, and time.  Psychiatric:        Mood and Affect: Affect normal.        Judgment: Judgment normal.     Labs:  Lab Results  Component Value Date   WBC 28.7 (H) 04/05/2021   HGB 13.7 04/05/2021   HCT 40.8 04/05/2021   MCV 89.3 04/05/2021   PLT 524 (H) 04/05/2021   NEUTROABS 12.6 (H) 04/02/2021    Lab Results  Component Value Date   NA 134 (L) 04/05/2021    K 4.4 04/05/2021   CL 95 (L) 04/05/2021   CO2 29 04/05/2021    Studies:  DG Chest Port 1 View  Result Date: 04/04/2021 CLINICAL DATA:  Respiratory failure. EXAM: PORTABLE CHEST 1 VIEW COMPARISON:  04/01/2021.  CT 03/27/2021. FINDINGS: Mediastinum and hilar structures are stable. Borderline cardiomegaly. No pulmonary venous congestion. Persistent large mass right upper lung again noted. Diffuse bilateral interstitial prominence again noted. Although interstitial edema and/or pneumonitis could present this fashion, lymphangitic tumor spread again cannot be excluded. Bibasilar atelectasis. Right base infiltrate. Right base pneumonia cannot be excluded. Small bilateral pleural effusions again noted. No acute bony abnormality identified. IMPRESSION: 1.  Large right upper lung mass again noted without interim change. 2. Diffuse bilateral interstitial prominence again noted. Although interstitial edema and/or pneumonitis could present in this fashion, lymphangitic tumor spread again cannot be excluded. 3. Bibasilar atelectasis. Right base infiltrate. Right base pneumonia cannot be excluded. Small bilateral pleural effusions again noted. 4.  Borderline cardiomegaly. Electronically Signed   By: Marcello Moores  Register   On: 04/04/2021 07:53    Malignant  pericardial effusion [facility]) #60 year old female patient with history of smoking currently admitted to hospital for pericardial tamponade/malignant pericardial effusion/acute respiratory failure  # Malignant pericardial tamponade-s/p pericardiocentesis;-secondary to lung adenocarcinoma-repeat 2D echo nodes of fluid recommendation.  #Acute respiratory failure-multifactorial-malignancy/lymphangitic spread/COPD stable.  Currently on steroids/bronchodilators/diuretics.  #A. Fib-amiodarone/Cardizem as per cardiology.  #Leukocytosis White count 32 ~likely secondary to steroids.  #Given a long discussion with patient and significant other regarding incurable  malignancy.  Response rates with chemoimmunotherapy of 70% however chemotherapy single agent 20 to 40%.  However as expected chemoimmunotherapy high risk of complications compared to single agent immunotherapy.  Again reviewed the pros and cons with the patient at length.  Also reviewed the above plan of care with patient's daughter Nichole Park.  Also discussed the median survival on average is approximately 1 to 2 years.  However, survival without therapy-in the order of weeks to months.  Discussed that if patient is improving/clinically stable could consider systemic therapy based on patient/family preference.   Cammie Sickle, MD 04/05/2021  10:44 PM

## 2021-04-05 NOTE — Progress Notes (Signed)
Nutrition Follow Up Note   DOCUMENTATION CODES:   Not applicable  INTERVENTION:   Add Boost Breeze po TID, each supplement provides 250 kcal and 9 grams of protein  Add Magic cup TID with meals, each supplement provides 290 kcal and 9 grams of protein  MVI po daily   NUTRITION DIAGNOSIS:   Increased nutrient needs related to other (see comment) (lung mass with suspsected malignancy) as evidenced by increased estimated needs.  GOAL:   Patient will meet greater than or equal to 90% of their needs -progressing   MONITOR:   PO intake, Supplement acceptance, Labs, Weight trends, Skin, I & O's  ASSESSMENT:   60 y.o. 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 [name] main complaint is that she has not had a BM in > 7 days; MD notified. Pt reports that she continues to have good appetite and oral intake in hospital. Pt is eating 100% of meals. Pt reports that she was unable to tolerate the Ensure as she reports that " its too thick in my throat" but pt reports that she has been drinking the mixed berry Boost Breeze. RD will change pt from Ensure to Boost Breeze per her request. RD will also add Magic Cups to meal trays. Refeed labs are stable. Per chart, pt has remained weight stable since admit. Palliative care is following. Pt may be eligible for chemotherapy after discharge is she is agreeable per MD note.   Medications reviewed and include: colace, lovenox, lasix, solu-medrol, MVI, nicotine, Kcl, NaCl tabs  Labs reviewed: Na 134(L), K 4.4 wnl, P 3.6 wnl, Mg 2.2 wnl  Wbc- 28.7(H)  Diet Order:   Diet Order             Diet regular Room service appropriate? Yes; Fluid consistency: Thin  Diet effective now                  EDUCATION NEEDS:    Education needs have been addressed  Skin:  Skin Assessment: Reviewed RN Assessment (incision sternum)  Last BM:  7/20- type 5  Height:   Ht Readings from Last 1 Encounters:  03/28/21 5' 1.5" (1.562 m)    Weight:   Wt Readings from Last 1 Encounters:  04/05/21 62 kg    Ideal Body Weight:  50 kg  BMI:  Body mass index is 25.41 kg/m.  Estimated Nutritional Needs:   Kcal:  1700-1900kcal/day  Protein:  80-90g/day  Fluid:  1.5-1.8L/day  Koleen Distance MS, RD, LDN Please refer to [facility] for RD and/or RD on-call/weekend/after hours pager

## 2021-04-06 LAB — BASIC METABOLIC PANEL
Anion gap: 9 (ref 5–15)
BUN: 16 mg/dL (ref 6–20)
CO2: 31 mmol/L (ref 22–32)
Calcium: 9.4 mg/dL (ref 8.9–10.3)
Chloride: 97 mmol/L — ABNORMAL LOW (ref 98–111)
Creatinine, Ser: 0.56 mg/dL (ref 0.44–1.00)
GFR, Estimated: >60 mL/min (ref >60)
Glucose, Bld: 175 mg/dL — ABNORMAL HIGH (ref 70–99)
Potassium: 5.4 mmol/L — ABNORMAL HIGH (ref 3.5–5.1)
Sodium: 137 mmol/L (ref 135–145)

## 2021-04-06 LAB — CBC
HCT: 42.7 % (ref 36.0–46.0)
Hemoglobin: 14.2 g/dL (ref 12.0–15.0)
MCH: 29.5 pg (ref 26.0–34.0)
MCHC: 33.3 g/dL (ref 30.0–36.0)
MCV: 88.6 fL (ref 80.0–100.0)
Platelets: 565 10*3/uL — ABNORMAL HIGH (ref 150–400)
RBC: 4.82 MIL/uL (ref 3.87–5.11)
RDW: 13 % (ref 11.5–15.5)
WBC: 23.6 10*3/uL — ABNORMAL HIGH (ref 4.0–10.5)
nRBC: 0 % (ref 0.0–0.2)

## 2021-04-06 LAB — POTASSIUM: Potassium: 4.8 mmol/L (ref 3.5–5.1)

## 2021-04-06 LAB — MAGNESIUM: Magnesium: 2.3 mg/dL (ref 1.7–2.4)

## 2021-04-06 MED ORDER — DILTIAZEM HCL ER COATED BEADS 120 MG PO CP24
240.0000 mg | ORAL_CAPSULE | Freq: Every day | ORAL | Status: DC
  Administered 2021-04-07 – 2021-04-10 (×4): 240 mg via ORAL
  Filled 2021-04-06: qty 2
  Filled 2021-04-06: qty 1
  Filled 2021-04-06: qty 2
  Filled 2021-04-06: qty 1

## 2021-04-06 MED ORDER — MELATONIN 5 MG PO TABS
2.5000 mg | ORAL_TABLET | Freq: Every day | ORAL | Status: DC
  Administered 2021-04-06 – 2021-04-09 (×4): 2.5 mg via ORAL
  Filled 2021-04-06 (×4): qty 1

## 2021-04-06 MED ORDER — METHYLPREDNISOLONE SODIUM SUCC 40 MG IJ SOLR
40.0000 mg | Freq: Every day | INTRAMUSCULAR | Status: DC
  Administered 2021-04-06 – 2021-04-07 (×2): 40 mg via INTRAVENOUS
  Filled 2021-04-06 (×2): qty 1

## 2021-04-06 NOTE — Progress Notes (Signed)
Progress Note  Patient Name: Nichole Park Date of Encounter: 04/06/2021  Primary Cardiologist: Nelva Bush, MD  Subjective   No chest pain.  Feels like breathing is improving - still req 5lpm.  Inpatient Medications    Scheduled Meds:  amiodarone  400 mg Oral BID   arformoterol  15 mcg Nebulization BID   budesonide (PULMICORT) nebulizer solution  0.25 mg Nebulization BID   Chlorhexidine Gluconate Cloth  6 each Topical Q0600   diltiazem  60 mg Oral Q6H   docusate sodium  200 mg Oral BID   enoxaparin (LOVENOX) injection  40 mg Subcutaneous Q24H   feeding supplement  1 Container Oral TID BM   furosemide  60 mg Intravenous Daily   mouth rinse  15 mL Mouth Rinse BID   melatonin  2.5 mg Oral QHS   methylPREDNISolone (SOLU-MEDROL) injection  40 mg Intravenous Daily   multivitamin with minerals  1 tablet Oral Daily   nicotine  21 mg Transdermal Daily   nystatin  5 mL Oral QID   polyethylene glycol  17 g Oral Daily   propylthiouracil  100 mg Oral Q8H   revefenacin  175 mcg Nebulization Daily   sodium chloride  1 g Oral TID WC   Continuous Infusions:  sodium chloride Stopped (04/02/21 0226)   PRN Meds: acetaminophen, bisacodyl, chlorpheniramine-HYDROcodone, guaiFENesin, guaiFENesin-dextromethorphan, levalbuterol, LORazepam, morphine injection   Vital Signs    Vitals:   04/06/21 0728 04/06/21 0800 04/06/21 0900 04/06/21 1000  BP:  118/67 93/82 133/74  Pulse:  75 84 78  Resp:  (!) 27 (!) 21 (!) 22  Temp:  (!) 97.5 F (36.4 C)    TempSrc:  Oral    SpO2: 96% 96% 99% (!) 88%  Weight:      Height:        Intake/Output Summary (Last 24 hours) at 04/06/2021 1215 Last data filed at 04/06/2021 0600 Gross per 24 hour  Intake --  Output 1600 ml  Net -1600 ml   Filed Weights   04/04/21 0500 04/05/21 0345 04/06/21 0500  Weight: 61.4 kg 62 kg 62 kg    Physical Exam   GEN: Well nourished, well developed, in no acute distress.  HEENT: Grossly normal.  Neck: Supple, no  JVD, carotid bruits, or masses. Cardiac: RRR, no murmurs, rubs, or gallops. No clubbing, cyanosis, edema.  Radials 2+, DP/PT 2+ and equal bilaterally.  Respiratory:  Respirations regular and unlabored, diminished breath sounds GI: Soft, nontender, nondistended, BS + x 4. MS: no deformity or atrophy. Skin: warm and dry, no rash. Neuro:  Strength and sensation are intact. Psych: AAOx3.  Normal affect.  Labs    Chemistry Recent Labs  Lab 04/02/21 0209 04/03/21 0327 04/05/21 0549 04/06/21 0537  NA 129* 131* 134* 137  K 3.5 4.2 4.4 5.4*  CL 88* 92* 95* 97*  CO2 32 33* 29 31  GLUCOSE 152* 173* 196* 175*  BUN 11 8 20 16   CREATININE 0.52 0.47 0.64 0.56  CALCIUM 8.3* 8.8* 9.0 9.4  PROT 6.4* 6.4*  --   --   ALBUMIN 2.3* 2.2*  --   --   AST 33 29  --   --   ALT 26 26  --   --   ALKPHOS 63 71  --   --   BILITOT 0.5 0.8  --   --   GFRNONAA >60 >60 >60 >60  ANIONGAP 9 6 10 9      Hematology Recent Labs  Lab  04/04/21 0445 04/05/21 0549 04/06/21 0537  WBC 32.0* 28.7* 23.6*  RBC 4.95 4.57 4.82  HGB 14.4 13.7 14.2  HCT 43.4 40.8 42.7  MCV 87.7 89.3 88.6  MCH 29.1 30.0 29.5  MCHC 33.2 33.6 33.3  RDW 12.5 12.6 13.0  PLT 549* 524* 565*   BNP Recent Labs  Lab 04/03/21 0327  BNP 382.5*    HbA1c  Lab Results  Component Value Date   HGBA1C 6.2 (H) 03/29/2021    Radiology    DG Chest Port 1 View  Result Date: 04/04/2021 CLINICAL DATA:  Respiratory failure. EXAM: PORTABLE CHEST 1 VIEW COMPARISON:  04/01/2021.  CT 03/27/2021. FINDINGS: Mediastinum and hilar structures are stable. Borderline cardiomegaly. No pulmonary venous congestion. Persistent large mass right upper lung again noted. Diffuse bilateral interstitial prominence again noted. Although interstitial edema and/or pneumonitis could present this fashion, lymphangitic tumor spread again cannot be excluded. Bibasilar atelectasis. Right base infiltrate. Right base pneumonia cannot be excluded. Small bilateral pleural  effusions again noted. No acute bony abnormality identified. IMPRESSION: 1.  Large right upper lung mass again noted without interim change. 2. Diffuse bilateral interstitial prominence again noted. Although interstitial edema and/or pneumonitis could present in this fashion, lymphangitic tumor spread again cannot be excluded. 3. Bibasilar atelectasis. Right base infiltrate. Right base pneumonia cannot be excluded. Small bilateral pleural effusions again noted. 4.  Borderline cardiomegaly. Electronically Signed   By: Marcello Moores  Register   On: 04/04/2021 07:53    Telemetry    Maintaining RSR - Personally Reviewed  Cardiac Studies   Pericardiocentesis 7.19.2022  Conclusions: Successful pericardiocentesis and tube pericardiotomy via the subxiphoid approach, yielding 500 mL of serosanginous fluid. _____________  2D Echocardiogram 7.20.2022   1. Left ventricular ejection fraction, by estimation, is 55%. The left  ventricle has normal function. The left ventricle has no regional wall  motion abnormalities. Left ventricular diastolic parameters are consistent  with Grade II diastolic dysfunction  (pseudonormalization).   2. Right ventricular systolic function is normal. The right ventricular  size is normal.   3. The mitral valve is normal in structure. No evidence of mitral valve  regurgitation.   4. The aortic valve was not well visualized. Aortic valve regurgitation  is not visualized.   5. The inferior vena cava is dilated in size with >50% respiratory  variability, suggesting right atrial pressure of 8 mmHg.  _____________  2D Echocardiogram 7.21.2022   1. Trvial pericardial effusion.   2. Left ventricular ejection fraction, by estimation, is 60 to 65%. The  left ventricle has normal function. The left ventricle has no regional  wall motion abnormalities.   3. Right ventricular systolic function is normal. The right ventricular  size is normal.   4. The mitral valve is normal in  structure. No evidence of mitral valve  regurgitation. No evidence of mitral stenosis.  _____________  2D Echocardiogram 7.25.2022   1. Left ventricular ejection fraction, by estimation, is >55%. The left  ventricle has normal function.   2. Right ventricular systolic function is normal. Moderately increased  right ventricular wall thickness.   3. Echogenic material in the pericardial space is noted, which could  represent epicardial fat. However, in the setting of malignant pericardial  effusion, tumor cannot be excluded.   Patient Profile     60 y.o. female with history of Graves' disease, tobacco use of 40+ years who presented with SOB and was found to have a large pericardial effusion requiring emergent pericardiocentesis, as well as a right upper  lobe mass suspicious for malignancy.  Assessment & Plan    1.  Malignant pericardial effusion with cardiac tamponade: Status post successful emergent pericardiocentesis on July 19 with removal of 500 mils of serosanguineous fluid.  Cytology consistent with malignancy.  Pericardial drain previously removed.  Serial echoes without significant reaccumulation of effusion.  2.  Paroxysmal atrial fibrillation: Now on amiodarone 400 mg twice daily and maintaining sinus rhythm.  Blood pressure tolerating diltiazem 60 mg every 6 hours.  We will consolidate to 240 mg daily starting tomorrow.  CHA2DS2-VASc equals 1.  Not currently on anticoagulation in the setting of low CHA2DS2VASc in addition to concern for malignant effusion.    3.  Adenocarcinoma of the lung: Seen by oncology.  Patient currently wishes to avoid chemotherapy.  Poor prognosis.  4.  Acute diastolic congestive heart failure: In the setting of rapid atrial fibrillation, patient with increased volume.  Echoes have shown normal LV function.  She is currently on furosemide 60 mg IV daily.  Renal function has been stable.  4.2 L out yesterday (intakes likely inaccurate).  Weight stable at 62  kg.  With ongoing high oxygen demands and stable renal function, will continue intravenous Lasix.  5.  Hyperkalemia: Mild at 5.4.  Creatinine stable.  Follow. Signed, Murray Hodgkins, NP  04/06/2021, 12:15 PM    For questions or updates, please contact   Please consult [email or web address] for contact info under Cardiology/STEMI.

## 2021-04-06 NOTE — Progress Notes (Signed)
PROGRESS NOTE    Nichole Park  YYT:[id number] DOB: 02-11-1961 DOA: 03/27/2021 PCP: Pcp, No  Assessment & Plan:   Principal Problem:   Cardiac tamponade Active Problems:   Cardiac/pericardial tamponade   Malignant pericardial effusion (HCC)   Tobacco use   PAF (paroxysmal atrial fibrillation) (HCC)   COPD (chronic obstructive pulmonary disease) (HCC)   Lung mass   Palliative care encounter   Pericardial effusion: w/ cardiac tamponade. S/p pericardiocentesis with 400 ml drainage. Drain has since been removed. Cytology confirms malignancy, special stains are pending. Remains tachypnic and SOB. Continue on IV lasix. CTA neg. TTE shows preserved EF, diastolic dysfunction, no reaccumulation of fluid but pericardial fat vs malignancy, poss still tamponade.   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 Final pathology is pending still. Not candidate currently for EBUS with biopsy if/when patient stable, likely as outpt as per Dr. Patsey Berthold. Chemoimmunotherapy recommended by onco but pt has not decided on this yet.  Oncology & onco palliative care is following and recs apprec   Acute hypoxic respiratory failure: secondary to lung cancer w/ lymphangitic spread.  Continue on IV steroids and bronchodilators.    Likely PAF: w/ RVR. High risk for PE but anticoagulation is contraindicated. Continue on amio and started on diltiazem as per cardio   Thrombocytosis: etiology unclear, labile    Hyperkalemia: repeat potassium level ordered   Hyponatremia: WNL today   Leukocytosis: likely secondary to steroid use   Acute cystitis: UA was suggestive of infection but urine cx neg. Abxs were d/c    Graves Disease: w/ hx diffuse goiter and elevated TSIs (in care everywhere). Hx rash w/ methimazole, stopped that several years ago and hasn't had f/u. Continue on propylthiouricil & will monitor LFTs   Pre-DM: HbA1c 6.2. Continue monitor BS w/ BMP daily       DVT prophylaxis: lovenox  Code  Status: full  Family Communication:  Disposition Plan: unclear   Level of care: Stepdown  Status is: Inpatient  Remains inpatient appropriate because:Ongoing diagnostic testing needed not appropriate for outpatient work up, Unsafe d/c plan, IV treatments appropriate due to intensity of illness or inability to take PO, and Inpatient level of care appropriate due to severity of illness  Dispo: The patient is from: Home              Anticipated d/c is to:  unclear              Patient currently is not medically stable to d/c.   Difficult to place patient Yes   Consultants:  Palliative care Onco Cardio Pulmon   Procedures:   Antimicrobials:    Subjective: Pt c/o difficulty deciding on whether start chemo/immuno therapy vs not.  Objective: Vitals:   04/06/21 0500 04/06/21 0600 04/06/21 0700 04/06/21 0728  BP: 91/64 114/74 114/70   Pulse: 75 74 76   Resp: (!) 21 (!) 21 (!) 21   Temp:      TempSrc:      SpO2: 95% 94% 99% 96%  Weight: 62 kg     Height:        Intake/Output Summary (Last 24 hours) at 04/06/2021 0850 Last data filed at 04/06/2021 0600 Gross per 24 hour  Intake 120 ml  Output 4200 ml  Net -4080 ml   Filed Weights   04/04/21 0500 04/05/21 0345 04/06/21 0500  Weight: 61.4 kg 62 kg 62 kg    Examination:  General exam: Appears emotional & tearful  Respiratory system: diminished  breath sounds b/l  Cardiovascular system: S1/S2+. No rubs or clicks   Gastrointestinal system: Abd is soft, NT, ND & hypoactive bowel sounds  Central nervous system: Alert and oriented. Moves all extremities Psychiatry: Judgement and insight appear normal.    Data Reviewed: I have personally reviewed following labs and imaging studies  CBC: Recent Labs  Lab 04/02/21 0209 04/03/21 0327 04/04/21 0445 04/05/21 0549 04/06/21 0537  WBC 17.8* 15.2* 32.0* 28.7* 23.6*  NEUTROABS 12.6*  --   --   --   --   HGB 14.0 13.9 14.4 13.7 14.2  HCT 41.7 41.1 43.4 40.8 42.7  MCV  86.0 87.6 87.7 89.3 88.6  PLT 440* 448* 549* 524* 564*   Basic Metabolic Panel: Recent Labs  Lab 04/01/21 0617 04/02/21 0209 04/03/21 0327 04/05/21 0549 04/06/21 0537  NA 130* 129* 131* 134* 137  K 3.3* 3.5 4.2 4.4 5.4*  CL 90* 88* 92* 95* 97*  CO2 31 32 33* 29 31  GLUCOSE 145* 152* 173* 196* 175*  BUN 7 11 8 20 16   CREATININE 0.46 0.52 0.47 0.64 0.56  CALCIUM 8.9 8.3* 8.8* 9.0 9.4  MG  --  2.1 2.4 2.2 2.3  PHOS  --  3.3 3.6  --   --    GFR: Estimated Creatinine Clearance: 66.4 mL/min (by C-G formula based on SCr of 0.56 mg/dL). Liver Function Tests: Recent Labs  Lab 04/02/21 0209 04/03/21 0327  AST 33 29  ALT 26 26  ALKPHOS 63 71  BILITOT 0.5 0.8  PROT 6.4* 6.4*  ALBUMIN 2.3* 2.2*   No results for input(s): LIPASE, AMYLASE in the last 168 hours. No results for input(s): AMMONIA in the last 168 hours. Coagulation Profile: No results for input(s): INR, PROTIME in the last 168 hours. Cardiac Enzymes: No results for input(s): CKTOTAL, CKMB, CKMBINDEX, TROPONINI in the last 168 hours. BNP (last 3 results) No results for input(s): PROBNP in the last 8760 hours. HbA1C: No results for input(s): HGBA1C in the last 72 hours. CBG: No results for input(s): GLUCAP in the last 168 hours. Lipid Profile: No results for input(s): CHOL, HDL, LDLCALC, TRIG, CHOLHDL, LDLDIRECT in the last 72 hours. Thyroid Function Tests: No results for input(s): TSH, T4TOTAL, FREET4, T3FREE, THYROIDAB in the last 72 hours. Anemia Panel: No results for input(s): VITAMINB12, FOLATE, FERRITIN, TIBC, IRON, RETICCTPCT in the last 72 hours. Sepsis Labs: No results for input(s): PROCALCITON, LATICACIDVEN in the last 168 hours.   Recent Results (from the past 240 hour(s))  Blood culture (routine single)     Status: None   Collection Time: 03/27/21  5:01 PM   Specimen: BLOOD  Result Value Ref Range Status   Specimen Description BLOOD RIGHT ANTECUBITAL  Final   Special Requests   Final    BOTTLES  DRAWN AEROBIC AND ANAEROBIC Blood Culture adequate volume   Culture   Final    NO GROWTH 6 DAYS Performed at [facility], [address] [place], [place] [address]    Report Status 04/02/2021 FINAL  Final  Resp Panel by RT-PCR (Flu A&B, Covid) Nasopharyngeal Swab     Status: None   Collection Time: 03/27/21  5:01 PM   Specimen: Nasopharyngeal Swab; Nasopharyngeal(NP) swabs in vial transport medium  Result Value Ref Range Status   SARS Coronavirus 2 by RT PCR NEGATIVE NEGATIVE Final    Comment: (NOTE) SARS-CoV-2 target nucleic acids are NOT DETECTED.  The SARS-CoV-2 RNA is generally detectable in upper respiratory specimens during the acute  phase of infection. The lowest concentration of SARS-CoV-2 viral copies this assay can detect is 138 copies/mL. A negative result does not preclude SARS-Cov-2 infection and should not be used as the sole basis for treatment or other patient management decisions. A negative result may occur with  improper specimen collection/handling, submission of specimen other than nasopharyngeal swab, presence of viral mutation(s) within the areas targeted by this assay, and inadequate number of viral copies(<138 copies/mL). A negative result must be combined with clinical observations, patient history, and epidemiological information. The expected result is Negative.  Fact Sheet for Patients:  EntrepreneurPulse.com.au  Fact Sheet for Healthcare Providers:  IncredibleEmployment.be  This test is no t yet approved or cleared by the [place] FDA and  has been authorized for detection and/or diagnosis of SARS-CoV-2 by FDA under an Emergency Use Authorization (EUA). This EUA will remain  in effect (meaning this test can be used) for the duration of the COVID-19 declaration under Section 564(b)(1) of the Act, 21 U.S.C.section 360bbb-3(b)(1), unless the authorization is terminated  or revoked sooner.        Influenza A by PCR NEGATIVE NEGATIVE Final   Influenza B by PCR NEGATIVE NEGATIVE Final    Comment: (NOTE) The Xpert Xpress SARS-CoV-2/FLU/RSV plus assay is intended as an aid in the diagnosis of influenza from Nasopharyngeal swab specimens and should not be used as a sole basis for treatment. Nasal washings and aspirates are unacceptable for Xpert Xpress SARS-CoV-2/FLU/RSV testing.  Fact Sheet for Patients: EntrepreneurPulse.com.au  Fact Sheet for Healthcare Providers: IncredibleEmployment.be  This test is not yet approved or cleared by the [place] FDA and has been authorized for detection and/or diagnosis of SARS-CoV-2 by FDA under an Emergency Use Authorization (EUA). This EUA will remain in effect (meaning this test can be used) for the duration of the COVID-19 declaration under Section 564(b)(1) of the Act, 21 U.S.C. section 360bbb-3(b)(1), unless the authorization is terminated or revoked.  Performed at [facility], [address] [place], [place] [address]   Urine Culture     Status: Abnormal   Collection Time: 03/27/21  5:01 PM   Specimen: In/Out Cath Urine  Result Value Ref Range Status   Specimen Description   Final    IN/OUT CATH URINE Performed at [facility], [address], [place], [place] [address]    Special Requests   Final    NONE Performed at [facility], [address] [place], [place] [address]    Culture (A)  Final    <10,000 COLONIES/mL INSIGNIFICANT GROWTH Performed at [facility] [address], [place], [place] [address]    Report Status 03/29/2021 FINAL  Final  Blood culture (single)     Status: None   Collection Time: 03/27/21  6:05 PM   Specimen: BLOOD  Result Value Ref Range Status   Specimen Description BLOOD LEFT ANTECUBITAL  Final   Special Requests   Final    BOTTLES DRAWN AEROBIC AND ANAEROBIC Blood Culture results may not be optimal due to an  inadequate volume of blood received in culture bottles   Culture   Final    NO GROWTH 6 DAYS Performed at [facility], [address], [place], [place] [address]    Report Status 04/02/2021 FINAL  Final  Body fluid culture w Gram Stain     Status: None   Collection Time: 03/27/21  8:26 PM   Specimen: Pericardial; Body Fluid  Result Value Ref Range Status  Specimen Description   Final    PERICARDIAL Performed at [facility], [address] [place], [place] [address]    Special Requests   Final    NONE Performed at [facility], [place], [place] [address]    Gram Stain   Final    RARE WBC PRESENT,BOTH PMN AND MONONUCLEAR NO ORGANISMS SEEN    Culture   Final    NO GROWTH 3 DAYS Performed at [facility], [place] [address], [place], [place] [address]    Report Status 03/31/2021 FINAL  Final  Acid Fast Smear (AFB)     Status: None   Collection Time: 03/27/21  8:26 PM   Specimen: Pericardial Fluid  Result Value Ref Range Status   AFB Specimen Processing Concentration  Final   Acid Fast Smear Negative  Final    Comment: (NOTE) Performed At: [facility] [address] [place], [place] [id number] Rush Farmer MD WC:[id number]    Source (AFB) FLUID  Final    Comment: Performed at [facility], [address] [place], [place] [address]  Culture, blood (routine x 2)     Status: None   Collection Time: 03/27/21 10:18 PM   Specimen: BLOOD RIGHT FOREARM  Result Value Ref Range Status   Specimen Description BLOOD RIGHT FOREARM  Final   Special Requests   Final    BOTTLES DRAWN AEROBIC AND ANAEROBIC Blood Culture adequate volume   Culture   Final    NO GROWTH 6 DAYS Performed at [facility], [address] [place], [place] [address]    Report Status 04/02/2021 FINAL  Final  MRSA Next Gen by PCR, Nasal     Status: None   Collection Time: 03/27/21 11:01 PM   Specimen: Nasal Mucosa; Nasal Swab   Result Value Ref Range Status   MRSA by PCR Next Gen NOT DETECTED NOT DETECTED Final    Comment: (NOTE) The GeneXpert MRSA Assay (FDA approved for NASAL specimens only), is one component of a comprehensive MRSA colonization surveillance program. It is not intended to diagnose MRSA infection nor to guide or monitor treatment for MRSA infections. Test performance is not FDA approved in patients less than 74 years old. Performed at [facility], [address], [place], [place] [address]          Radiology Studies: No results found.      Scheduled Meds:  amiodarone  400 mg Oral BID   arformoterol  15 mcg Nebulization BID   budesonide (PULMICORT) nebulizer solution  0.25 mg Nebulization BID   Chlorhexidine Gluconate Cloth  6 each Topical Q0600   diltiazem  60 mg Oral Q6H   docusate sodium  200 mg Oral BID   enoxaparin (LOVENOX) injection  40 mg Subcutaneous Q24H   feeding supplement  1 Container Oral TID BM   furosemide  60 mg Intravenous Daily   mouth rinse  15 mL Mouth Rinse BID   methylPREDNISolone (SOLU-MEDROL) injection  40 mg Intravenous Q12H   multivitamin with minerals  1 tablet Oral Daily   nicotine  21 mg Transdermal Daily   nystatin  5 mL Oral QID   polyethylene glycol  17 g Oral Daily   propylthiouracil  100 mg Oral Q8H   revefenacin  175 mcg Nebulization Daily   sodium chloride  1 g Oral TID WC   Continuous Infusions:  sodium chloride Stopped (04/02/21 0226)     LOS: 10 days  Time spent: 45 mins    Wyvonnia Dusky, MD [facility] Pager [phone]  If 7PM-7AM, please contact night-coverage  04/06/2021, 8:50 AM

## 2021-04-06 NOTE — Progress Notes (Signed)
Pt weaned from 6L [place] to 3L [place]. Pt up to bedside commode. Pt voding via purewick. No acute changes during shift. Pt in no acute distress @ this time. Will continue to monitor.

## 2021-04-07 LAB — CBC
HCT: 43.4 % (ref 36.0–46.0)
Hemoglobin: 14.4 g/dL (ref 12.0–15.0)
MCH: 29.1 pg (ref 26.0–34.0)
MCHC: 33.2 g/dL (ref 30.0–36.0)
MCV: 87.9 fL (ref 80.0–100.0)
Platelets: 600 10*3/uL — ABNORMAL HIGH (ref 150–400)
RBC: 4.94 MIL/uL (ref 3.87–5.11)
RDW: 13 % (ref 11.5–15.5)
WBC: 27.4 10*3/uL — ABNORMAL HIGH (ref 4.0–10.5)
nRBC: 0 % (ref 0.0–0.2)

## 2021-04-07 LAB — TSH: TSH: 4.241 u[IU]/mL (ref 0.350–4.500)

## 2021-04-07 LAB — BASIC METABOLIC PANEL
Anion gap: 9 (ref 5–15)
BUN: 23 mg/dL — ABNORMAL HIGH (ref 6–20)
CO2: 30 mmol/L (ref 22–32)
Calcium: 9.5 mg/dL (ref 8.9–10.3)
Chloride: 95 mmol/L — ABNORMAL LOW (ref 98–111)
Creatinine, Ser: 0.63 mg/dL (ref 0.44–1.00)
GFR, Estimated: >60 mL/min (ref >60)
Glucose, Bld: 123 mg/dL — ABNORMAL HIGH (ref 70–99)
Potassium: 4.3 mmol/L (ref 3.5–5.1)
Sodium: 134 mmol/L — ABNORMAL LOW (ref 135–145)

## 2021-04-07 LAB — MAGNESIUM: Magnesium: 2.9 mg/dL — ABNORMAL HIGH (ref 1.7–2.4)

## 2021-04-07 MED ORDER — FUROSEMIDE 40 MG PO TABS
40.0000 mg | ORAL_TABLET | Freq: Every day | ORAL | Status: DC
  Administered 2021-04-08 – 2021-04-10 (×3): 40 mg via ORAL
  Filled 2021-04-07 (×2): qty 1
  Filled 2021-04-07: qty 2

## 2021-04-07 MED ORDER — METHYLPREDNISOLONE SODIUM SUCC 40 MG IJ SOLR
20.0000 mg | Freq: Every day | INTRAMUSCULAR | Status: DC
  Administered 2021-04-08: 20 mg via INTRAVENOUS
  Filled 2021-04-07: qty 1

## 2021-04-07 NOTE — Progress Notes (Signed)
Pt restless and anxious during the night. Moved to chair per pt request to aid in comfort. Offered medication to help pt feel more at ease. Pt refused. Repositioned and provided education. Will continue to monitor.

## 2021-04-07 NOTE — Progress Notes (Signed)
Progress Note  Patient Name: Nichole Park Date of Encounter: 04/07/2021  [facility] Cardiologist: [facility]  Subjective   Sitting up in recliner, feels that she can go home Breathing back to her baseline Would like to try holistic approach for her cancer Reports that she is aware of a story of elderly man recently diagnosed with cancer did holistic approach now driving and cancer is resolved Family in the room with her, discussing the plan Telemetry reviewed, 1 very brief episode atrial fibrillation otherwise maintaining normal sinus rhythm  Inpatient Medications    Scheduled Meds:  amiodarone  400 mg Oral BID   arformoterol  15 mcg Nebulization BID   budesonide (PULMICORT) nebulizer solution  0.25 mg Nebulization BID   Chlorhexidine Gluconate Cloth  6 each Topical Q0600   diltiazem  240 mg Oral Daily   docusate sodium  200 mg Oral BID   enoxaparin (LOVENOX) injection  40 mg Subcutaneous Q24H   feeding supplement  1 Container Oral TID BM   furosemide  60 mg Intravenous Daily   mouth rinse  15 mL Mouth Rinse BID   melatonin  2.5 mg Oral QHS   methylPREDNISolone (SOLU-MEDROL) injection  40 mg Intravenous Daily   multivitamin with minerals  1 tablet Oral Daily   nicotine  21 mg Transdermal Daily   nystatin  5 mL Oral QID   polyethylene glycol  17 g Oral Daily   propylthiouracil  100 mg Oral Q8H   revefenacin  175 mcg Nebulization Daily   sodium chloride  1 g Oral TID WC   Continuous Infusions:  sodium chloride Stopped (04/02/21 0226)   PRN Meds: acetaminophen, bisacodyl, chlorpheniramine-HYDROcodone, guaiFENesin, guaiFENesin-dextromethorphan, levalbuterol, LORazepam, morphine injection   Vital Signs    Vitals:   04/07/21 0630 04/07/21 0700 04/07/21 0800 04/07/21 0900  BP:  114/66 (!) 132/95 (!) 142/98  Pulse: 81 80 89 84  Resp: (!) 21 (!) 22  18  Temp:      TempSrc:      SpO2: (!) 88% 93% (!) 86% 96%  Weight:      Height:        Intake/Output Summary (Last 24  hours) at 04/07/2021 1015 Last data filed at 04/07/2021 0100 Gross per 24 hour  Intake 150 ml  Output 800 ml  Net -650 ml   Last 3 Weights 04/06/2021 04/05/2021 04/04/2021  Weight (lbs) 136 lb 11 oz 136 lb 11 oz 135 lb 5.8 oz  Weight (kg) 62 kg 62 kg 61.4 kg      Telemetry    Normal sinus rhythm rate 80 bpm personally Reviewed  ECG    - Personally Reviewed  Physical Exam   Constitutional:  oriented to person, place, and time. No distress.  HENT:  Head: Grossly normal Eyes:  no discharge. No scleral icterus.  Neck: No JVD, no carotid bruits  Cardiovascular: Regular rate and rhythm, no murmurs appreciated Pulmonary/Chest: Clear to auscultation bilaterally, no wheezes or rails Abdominal: Soft.  no distension.  no tenderness.  Musculoskeletal: Normal range of motion Neurological:  normal muscle tone. Coordination normal. No atrophy Skin: Skin warm and dry Psychiatric: normal affect, pleasant  Labs    High Sensitivity Troponin:  No results for input(s): TROPONINIHS in the last 720 hours.    Chemistry Recent Labs  Lab 04/02/21 0209 04/03/21 0327 04/05/21 0549 04/06/21 0537 04/06/21 1628 04/07/21 0524  NA 129* 131* 134* 137  --  134*  K 3.5 4.2 4.4 5.4* 4.8 4.3  CL  88* 92* 95* 97*  --  95*  CO2 32 33* 29 31  --  30  GLUCOSE 152* 173* 196* 175*  --  123*  BUN 11 8 20 16   --  23*  CREATININE 0.52 0.47 0.64 0.56  --  0.63  CALCIUM 8.3* 8.8* 9.0 9.4  --  9.5  PROT 6.4* 6.4*  --   --   --   --   ALBUMIN 2.3* 2.2*  --   --   --   --   AST 33 29  --   --   --   --   ALT 26 26  --   --   --   --   ALKPHOS 63 71  --   --   --   --   BILITOT 0.5 0.8  --   --   --   --   GFRNONAA >60 >60 >60 >60  --  >60  ANIONGAP 9 6 10 9   --  9     Hematology Recent Labs  Lab 04/05/21 0549 04/06/21 0537 04/07/21 0524  WBC 28.7* 23.6* 27.4*  RBC 4.57 4.82 4.94  HGB 13.7 14.2 14.4  HCT 40.8 42.7 43.4  MCV 89.3 88.6 87.9  MCH 30.0 29.5 29.1  MCHC 33.6 33.3 33.2  RDW 12.6 13.0  13.0  PLT 524* 565* 600*    BNP Recent Labs  Lab 04/03/21 0327  BNP 382.5*     DDimer No results for input(s): DDIMER in the last 168 hours.   Radiology    No results found.  Cardiac Studies   Echo April 02, 2021  1. Left ventricular ejection fraction, by estimation, is >55%. The left  ventricle has normal function.   2. Right ventricular systolic function is normal. Moderately increased  right ventricular wall thickness.   3. Echogenic material in the pericardial space is noted, which could  represent epicardial fat. However, in the setting of malignant pericardial  effusion, tumor cannot be excluded.   Patient Profile     60 y.o. female with history of Graves' disease, tobacco use of 40+ years who presented with SOB and was found to have a large pericardial effusion requiring emergent pericardiocentesis, as well as a right upper lobe mass suspicious for malignancy.  Assessment & Plan   1. Pericardial effusion and cardiac tamponade: -Status post successful emergent pericardiocentesis on 03/27/2021 with removal of 500 mL of serosanguinous fluid with cytology consistent with malignancy of unknown primary source at this time with immunohistochemical stains pending -Pericardial drain removed  Repeat echocardiograms no significant reaccumulation of effusion Outpatient follow-up   2.  Paroxysmal atrial fibrillation  Very difficult controlling her paroxysmal atrial fibrillation this hospitalization, has had most to 10 g amiodarone load Was still breaking through having paroxysmal atrial fibrillation 2 and 3 days ago, seems to have settled down predominantly normal sinus rhythm on diltiazem Diltiazem has been consolidated to extended release 240 daily amiodarone continued at 400 twice daily At the time of discharge will decrease amiodarone down to 200 twice daily Might be able to increase diltiazem extended release up to 300 mg daily depending on blood pressures through  today -CHADS2VASc at least 1 (sex category) -Not currently on full dose anticoagulation   3. Lung mass/emphysema/small bilateral pleural effusions/tobacco use: Management per oncology Patient is indicated she would like to try a "holistic approach"  4.  Acute diastolic CHF secondary to atrial fibrillation She has declined IV Lasix today Will discontinue IV Lasix,  there is a slight trend up in her BUN concerning for prerenal state Will transition to oral Lasix 40 daily starting tomorrow  She is indicated she would like to go home Discussed with nursing and hospitalist service Stressed importance of taking her cardiac medications  Total encounter time more than 35 minutes  Greater than 50% was spent in counseling and coordination of care with the patient   For questions or updates, please contact [place] Please consult [email or web address] for contact info under        Signed, Ida Rogue, MD  04/07/2021, 10:15 AM

## 2021-04-07 NOTE — Progress Notes (Signed)
Pt remains on 3L [place]. Pt refused lasix. Pt remains in NSR, VSS. Pt expressed interest in wanting to go home, message passed onto Hospitalist team (Dr. Jimmye Norman). Thyroid level and urine culture sent today. Pt in no acute distress @ this time. Will continue to monitor.

## 2021-04-07 NOTE — Progress Notes (Signed)
PROGRESS NOTE    Nichole Park  NOM:[id number] DOB: Feb 11, 1961 DOA: 03/27/2021 PCP: Pcp, No  Assessment & Plan:   Principal Problem:   Cardiac tamponade Active Problems:   Cardiac/pericardial tamponade   Malignant pericardial effusion (HCC)   Tobacco use   PAF (paroxysmal atrial fibrillation) (HCC)   COPD (chronic obstructive pulmonary disease) (HCC)   Lung mass   Palliative care encounter   Pericardial effusion: w/ cardiac tamponade. S/p pericardiocentesis with 400 ml drainage. Drain has since been removed. Cytology confirms malignancy, special stains are pending. Remains tachypnic and SOB. Continue on lasix. CTA neg. TTE shows preserved EF, diastolic dysfunction, no reaccumulation of fluid but pericardial fat vs malignancy, poss still tamponade.   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 Final pathology is pending still. Not candidate currently for EBUS with biopsy if/when patient stable, likely as outpt as per Dr. Patsey Berthold. Chemoimmunotherapy recommended by onco but pt has not decided on this yet and today asking for a second opinion & stating that she may have a fungal or bacterial infection instead of lung cancer.  Oncology & onco palliative care is following and recs apprec   Acute hypoxic respiratory failure: secondary to lung cancer w/ lymphangitic spread.  Continue on steroid taper and continue on supplemental oxygen    Likely PAF: w/ intermittent RVR. High risk for PE but anticoagulation is contraindicated. Continue on amiodarone & diltiazem as per cardio  Thrombocytosis: labile, etiology unclear.    Hyperkalemia: resolved    Hyponatremia: labile. Will continue to monitor   Leukocytosis: likely secondary steroid use   Acute cystitis: UA was suggestive of infection but urine cx neg. Abxs were d/c    Graves Disease: w/ hx diffuse goiter and elevated TSIs (in care everywhere). Hx rash w/ methimazole, stopped that several years ago and hasn't had f/u. Continue on  propylthiouricil & will monitor LFTs. TSH ordered per request of pt's daughter    Pre-DM: HbA1c 6.2. Continue to monitor BS w/ BMP       DVT prophylaxis: lovenox  Code Status: full  Family Communication: discussed pt's care w/ pt's family at bedside and answered their questions Disposition Plan: unclear   Level of care: Stepdown  Status is: Inpatient  Remains inpatient appropriate because:Ongoing diagnostic testing needed not appropriate for outpatient work up, Unsafe d/c plan, IV treatments appropriate due to intensity of illness or inability to take PO, and Inpatient level of care appropriate due to severity of illness  Dispo: The patient is from: Home              Anticipated d/c is to:  unclear              Patient currently is not medically stable to d/c.   Difficult to place patient Yes   Consultants:  Palliative care Onco Cardio Pulmon   Procedures:   Antimicrobials:    Subjective: Pt is wanting to go home and get a second opinion   Objective: Vitals:   04/07/21 0600 04/07/21 0615 04/07/21 0630 04/07/21 0700  BP:  131/75  114/66  Pulse: 74 71 81 80  Resp:  (!) 21 (!) 21 (!) 22  Temp:      TempSrc:      SpO2: 95% 92% (!) 88% 93%  Weight:      Height:        Intake/Output Summary (Last 24 hours) at 04/07/2021 0820 Last data filed at 04/07/2021 0100 Gross per 24 hour  Intake 150 ml  Output 800 ml  Net -650 ml   Filed Weights   04/04/21 0500 04/05/21 0345 04/06/21 0500  Weight: 61.4 kg 62 kg 62 kg    Examination:  General exam: Appears to have pressured speech  Respiratory system: decreased breath sounds b/l  Cardiovascular system: S1/S2+. No rubs or clicks  Gastrointestinal system: Abd is soft, NT, ND & hypoactive bowel sounds  Central nervous system: Alert and oriented. Moves all extremities  Psychiatry: Judgement and insight appears normal. Tangential thinking & pressured speech    Data Reviewed: I have personally reviewed following labs  and imaging studies  CBC: Recent Labs  Lab 04/02/21 0209 04/03/21 0327 04/04/21 0445 04/05/21 0549 04/06/21 0537 04/07/21 0524  WBC 17.8* 15.2* 32.0* 28.7* 23.6* 27.4*  NEUTROABS 12.6*  --   --   --   --   --   HGB 14.0 13.9 14.4 13.7 14.2 14.4  HCT 41.7 41.1 43.4 40.8 42.7 43.4  MCV 86.0 87.6 87.7 89.3 88.6 87.9  PLT 440* 448* 549* 524* 565* 188*   Basic Metabolic Panel: Recent Labs  Lab 04/02/21 0209 04/03/21 0327 04/05/21 0549 04/06/21 0537 04/06/21 1628 04/07/21 0524  NA 129* 131* 134* 137  --  134*  K 3.5 4.2 4.4 5.4* 4.8 4.3  CL 88* 92* 95* 97*  --  95*  CO2 32 33* 29 31  --  30  GLUCOSE 152* 173* 196* 175*  --  123*  BUN 11 8 20 16   --  23*  CREATININE 0.52 0.47 0.64 0.56  --  0.63  CALCIUM 8.3* 8.8* 9.0 9.4  --  9.5  MG 2.1 2.4 2.2 2.3  --  2.9*  PHOS 3.3 3.6  --   --   --   --    GFR: Estimated Creatinine Clearance: 66.4 mL/min (by C-G formula based on SCr of 0.63 mg/dL). Liver Function Tests: Recent Labs  Lab 04/02/21 0209 04/03/21 0327  AST 33 29  ALT 26 26  ALKPHOS 63 71  BILITOT 0.5 0.8  PROT 6.4* 6.4*  ALBUMIN 2.3* 2.2*   No results for input(s): LIPASE, AMYLASE in the last 168 hours. No results for input(s): AMMONIA in the last 168 hours. Coagulation Profile: No results for input(s): INR, PROTIME in the last 168 hours. Cardiac Enzymes: No results for input(s): CKTOTAL, CKMB, CKMBINDEX, TROPONINI in the last 168 hours. BNP (last 3 results) No results for input(s): PROBNP in the last 8760 hours. HbA1C: No results for input(s): HGBA1C in the last 72 hours. CBG: No results for input(s): GLUCAP in the last 168 hours. Lipid Profile: No results for input(s): CHOL, HDL, LDLCALC, TRIG, CHOLHDL, LDLDIRECT in the last 72 hours. Thyroid Function Tests: No results for input(s): TSH, T4TOTAL, FREET4, T3FREE, THYROIDAB in the last 72 hours. Anemia Panel: No results for input(s): VITAMINB12, FOLATE, FERRITIN, TIBC, IRON, RETICCTPCT in the last 72  hours. Sepsis Labs: No results for input(s): PROCALCITON, LATICACIDVEN in the last 168 hours.   No results found for this or any previous visit (from the past 240 hour(s)).        Radiology Studies: No results found.      Scheduled Meds:  amiodarone  400 mg Oral BID   arformoterol  15 mcg Nebulization BID   budesonide (PULMICORT) nebulizer solution  0.25 mg Nebulization BID   Chlorhexidine Gluconate Cloth  6 each Topical Q0600   diltiazem  240 mg Oral Daily   docusate sodium  200 mg Oral BID   enoxaparin (LOVENOX)  injection  40 mg Subcutaneous Q24H   feeding supplement  1 Container Oral TID BM   furosemide  60 mg Intravenous Daily   mouth rinse  15 mL Mouth Rinse BID   melatonin  2.5 mg Oral QHS   methylPREDNISolone (SOLU-MEDROL) injection  40 mg Intravenous Daily   multivitamin with minerals  1 tablet Oral Daily   nicotine  21 mg Transdermal Daily   nystatin  5 mL Oral QID   polyethylene glycol  17 g Oral Daily   propylthiouracil  100 mg Oral Q8H   revefenacin  175 mcg Nebulization Daily   sodium chloride  1 g Oral TID WC   Continuous Infusions:  sodium chloride Stopped (04/02/21 0226)     LOS: 11 days    Time spent: 35 mins    Wyvonnia Dusky, MD [facility] Pager [phone]  If 7PM-7AM, please contact night-coverage  04/07/2021, 8:20 AM

## 2021-04-08 LAB — CBC
HCT: 41.8 % (ref 36.0–46.0)
Hemoglobin: 13.7 g/dL (ref 12.0–15.0)
MCH: 28.4 pg (ref 26.0–34.0)
MCHC: 32.8 g/dL (ref 30.0–36.0)
MCV: 86.5 fL (ref 80.0–100.0)
Platelets: 499 10*3/uL — ABNORMAL HIGH (ref 150–400)
RBC: 4.83 MIL/uL (ref 3.87–5.11)
RDW: 13.2 % (ref 11.5–15.5)
WBC: 21.7 10*3/uL — ABNORMAL HIGH (ref 4.0–10.5)
nRBC: 0 % (ref 0.0–0.2)

## 2021-04-08 LAB — COMPREHENSIVE METABOLIC PANEL
ALT: 45 U/L — ABNORMAL HIGH (ref 0–44)
AST: 24 U/L (ref 15–41)
Albumin: 2.4 g/dL — ABNORMAL LOW (ref 3.5–5.0)
Alkaline Phosphatase: 81 U/L (ref 38–126)
Anion gap: 5 (ref 5–15)
BUN: 22 mg/dL — ABNORMAL HIGH (ref 6–20)
CO2: 29 mmol/L (ref 22–32)
Calcium: 8.9 mg/dL (ref 8.9–10.3)
Chloride: 101 mmol/L (ref 98–111)
Creatinine, Ser: 0.7 mg/dL (ref 0.44–1.00)
GFR, Estimated: >60 mL/min (ref >60)
Glucose, Bld: 130 mg/dL — ABNORMAL HIGH (ref 70–99)
Potassium: 5.1 mmol/L (ref 3.5–5.1)
Sodium: 135 mmol/L (ref 135–145)
Total Bilirubin: 0.5 mg/dL (ref 0.3–1.2)
Total Protein: 5.8 g/dL — ABNORMAL LOW (ref 6.5–8.1)

## 2021-04-08 LAB — MAGNESIUM: Magnesium: 2.6 mg/dL — ABNORMAL HIGH (ref 1.7–2.4)

## 2021-04-08 MED ORDER — PREDNISONE 10 MG PO TABS
10.0000 mg | ORAL_TABLET | Freq: Every day | ORAL | Status: AC
  Administered 2021-04-09 – 2021-04-10 (×2): 10 mg via ORAL
  Filled 2021-04-08 (×2): qty 1

## 2021-04-08 MED ORDER — AMIODARONE HCL 200 MG PO TABS
200.0000 mg | ORAL_TABLET | Freq: Two times a day (BID) | ORAL | Status: DC
  Administered 2021-04-08 – 2021-04-10 (×4): 200 mg via ORAL
  Filled 2021-04-08 (×4): qty 1

## 2021-04-08 NOTE — Progress Notes (Deleted)
omit

## 2021-04-08 NOTE — Progress Notes (Signed)
Progress Note  Patient Name: Nichole Park Date of Encounter: 04/08/2021  [facility] Cardiologist: [facility]  Subjective   Sitting up in recliner Reports that she has misgivings about her discussions yesterday with physicians,  Felt that she could leave the hospital ,that she was doing fine, seek care elsewhere, could cure her cancer with holistic medicine... She may have spoken prematurely  Telemetry reviewed, no significant arrhythmia Blood pressure stable, denies shortness of breath Ambulating around the room, able to go to the bathroom herself without assistance  Inpatient Medications    Scheduled Meds:  amiodarone  400 mg Oral BID   arformoterol  15 mcg Nebulization BID   budesonide (PULMICORT) nebulizer solution  0.25 mg Nebulization BID   Chlorhexidine Gluconate Cloth  6 each Topical Q0600   diltiazem  240 mg Oral Daily   docusate sodium  200 mg Oral BID   enoxaparin (LOVENOX) injection  40 mg Subcutaneous Q24H   feeding supplement  1 Container Oral TID BM   furosemide  40 mg Oral Daily   mouth rinse  15 mL Mouth Rinse BID   melatonin  2.5 mg Oral QHS   multivitamin with minerals  1 tablet Oral Daily   nicotine  21 mg Transdermal Daily   nystatin  5 mL Oral QID   polyethylene glycol  17 g Oral Daily   [START ON 04/09/2021] predniSONE  10 mg Oral Q breakfast   propylthiouracil  100 mg Oral Q8H   revefenacin  175 mcg Nebulization Daily   sodium chloride  1 g Oral TID WC   Continuous Infusions:  sodium chloride Stopped (04/02/21 0226)   PRN Meds: acetaminophen, bisacodyl, chlorpheniramine-HYDROcodone, guaiFENesin, guaiFENesin-dextromethorphan, levalbuterol, LORazepam, morphine injection   Vital Signs    Vitals:   04/08/21 0845 04/08/21 0900 04/08/21 1100 04/08/21 1300  BP: 102/70 120/70 114/82 92/66  Pulse: 78 84 83 88  Resp:    (!) 22  Temp: 98 F (36.7 C)     TempSrc: Oral     SpO2: 95% 92% (!) 83% 93%  Weight:      Height:        Intake/Output Summary  (Last 24 hours) at 04/08/2021 1325 Last data filed at 04/08/2021 1230 Gross per 24 hour  Intake 240 ml  Output 1150 ml  Net -910 ml   Last 3 Weights 04/06/2021 04/05/2021 04/04/2021  Weight (lbs) 136 lb 11 oz 136 lb 11 oz 135 lb 5.8 oz  Weight (kg) 62 kg 62 kg 61.4 kg      Telemetry    Normal sinus rhythm rate 80 bpm personally Reviewed  ECG    - Personally Reviewed  Physical Exam   Constitutional:  oriented to person, place, and time. No distress.  HENT:  Head: Grossly normal Eyes:  no discharge. No scleral icterus.  Neck: No JVD, no carotid bruits  Cardiovascular: Regular rate and rhythm, no murmurs appreciated Pulmonary/Chest: Clear to auscultation bilaterally, no wheezes or rails Abdominal: Soft.  no distension.  no tenderness.  Musculoskeletal: Normal range of motion Neurological:  normal muscle tone. Coordination normal. No atrophy Skin: Skin warm and dry Psychiatric: normal affect, pleasant   Labs    High Sensitivity Troponin:  No results for input(s): TROPONINIHS in the last 720 hours.    Chemistry Recent Labs  Lab 04/02/21 0209 04/03/21 0327 04/05/21 0549 04/06/21 0537 04/06/21 1628 04/07/21 0524 04/08/21 0512  NA 129* 131*   < > 137  --  134* 135  K 3.5  4.2   < > 5.4* 4.8 4.3 5.1  CL 88* 92*   < > 97*  --  95* 101  CO2 32 33*   < > 31  --  30 29  GLUCOSE 152* 173*   < > 175*  --  123* 130*  BUN 11 8   < > 16  --  23* 22*  CREATININE 0.52 0.47   < > 0.56  --  0.63 0.70  CALCIUM 8.3* 8.8*   < > 9.4  --  9.5 8.9  PROT 6.4* 6.4*  --   --   --   --  5.8*  ALBUMIN 2.3* 2.2*  --   --   --   --  2.4*  AST 33 29  --   --   --   --  24  ALT 26 26  --   --   --   --  45*  ALKPHOS 63 71  --   --   --   --  81  BILITOT 0.5 0.8  --   --   --   --  0.5  GFRNONAA >60 >60   < > >60  --  >60 >60  ANIONGAP 9 6   < > 9  --  9 5   < > = values in this interval not displayed.     Hematology Recent Labs  Lab 04/06/21 0537 04/07/21 0524 04/08/21 0512  WBC 23.6*  27.4* 21.7*  RBC 4.82 4.94 4.83  HGB 14.2 14.4 13.7  HCT 42.7 43.4 41.8  MCV 88.6 87.9 86.5  MCH 29.5 29.1 28.4  MCHC 33.3 33.2 32.8  RDW 13.0 13.0 13.2  PLT 565* 600* 499*    BNP Recent Labs  Lab 04/03/21 0327  BNP 382.5*     DDimer No results for input(s): DDIMER in the last 168 hours.   Radiology    No results found.  Cardiac Studies   Echo April 02, 2021  1. Left ventricular ejection fraction, by estimation, is >55%. The left  ventricle has normal function.   2. Right ventricular systolic function is normal. Moderately increased  right ventricular wall thickness.   3. Echogenic material in the pericardial space is noted, which could  represent epicardial fat. However, in the setting of malignant pericardial  effusion, tumor cannot be excluded.   Patient Profile     60 y.o. female with history of Graves' disease, tobacco use of 40+ years who presented with SOB and was found to have a large pericardial effusion requiring emergent pericardiocentesis, as well as a right upper lobe mass suspicious for malignancy.  Assessment & Plan   1. Pericardial effusion and cardiac tamponade: -Status post successful emergent pericardiocentesis on 03/27/2021 with removal of 500 mL of serosanguinous fluid with cytology consistent with malignancy of unknown primary source at this time with immunohistochemical stains pending -Pericardial drain removed  Repeat echocardiograms no significant reaccumulation of effusion Denies shortness of breath   2.  Paroxysmal atrial fibrillation  Very difficult controlling her paroxysmal atrial fibrillation this hospitalization,  Now appears to have stabilized normal sinus rhythm for the past several days on current regimen including -We will decrease diltiazem extended release down to 180 mg daily given low blood pressure Will decrease amiodarone back to 200 twice daily given several days of normal sinus rhythm -CHADS2VASc at least 1 (sex  category) -Not currently on full dose anticoagulation   3. Lung mass/emphysema/small bilateral pleural effusions/tobacco use: Management per oncology  Yesterday reported wanting to pursue a "holistic approach" Today wonders if she should get a second opinion at [place] she talk with oncology at this hospital  4.  Acute diastolic CHF secondary to atrial fibrillation Lasix 40 daily, for any worsening BUN/creatinine and low blood pressure will decrease down to 20 daily or every other day 40 mg  Long discussion with her concerning treatment options for her situation,  We have suggested she talk with oncology to discuss options Discussed her cardiac medications  Total encounter time more than 35 minutes  Greater than 50% was spent in counseling and coordination of care with the patient   For questions or updates, please contact [place] Please consult [email or web address] for contact info under        Signed, Ida Rogue, MD  04/08/2021, 1:25 PM

## 2021-04-08 NOTE — Progress Notes (Signed)
PROGRESS NOTE    Nichole Park  UXN:[id number] DOB: 09-30-1960 DOA: 03/27/2021 PCP: Pcp, No  Assessment & Plan:   Principal Problem:   Cardiac tamponade Active Problems:   Cardiac/pericardial tamponade   Malignant pericardial effusion (HCC)   Tobacco use   PAF (paroxysmal atrial fibrillation) (HCC)   COPD (chronic obstructive pulmonary disease) (HCC)   Lung mass   Palliative care encounter   Pericardial effusion: w/ cardiac tamponade. S/p pericardiocentesis with 400 ml drainage. Drain has since been removed. Cytology confirms malignancy, special stains are pending. Continue on lasix. CTA neg. TTE shows preserved EF, diastolic dysfunction, no reaccumulation of fluid but pericardial fat vs malignancy, poss still tamponade.   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 Final pathology is pending still. Not candidate currently for EBUS with biopsy if/when patient stable, likely as outpt as per Dr. Patsey Berthold. Chemoimmunotherapy recommended by onco but pt has not decided on this yet and pt is still considering getting a second opinion. Oncology & onco palliative care is following and recs apprec   Acute hypoxic respiratory failure: secondary to lung cancer w/ lymphangitic spread.  Continue on supplemental oxygen and continue on steroid taper    Likely PAF: w/ intermittent RVR. High risk for PE but anticoagulation is contraindicated. Continue on amiodarone & diltiazem as per cardio  Thrombocytosis: etiology unclear, labile    Hyperkalemia: resolved    Hyponatremia: resolved   Leukocytosis: likely secondary to steroid use    Acute cystitis: UA was suggestive of infection but urine cx neg. Abxs were d/c    Graves Disease: w/ hx diffuse goiter and elevated TSIs (in care everywhere). Hx rash w/ methimazole, stopped that several years ago and hasn't had f/u. Continue on propylthiouricil & will monitor LFTs. TSH ordered per request of pt's daughter    Pre-DM: HbA1c 6.2. Continue to  monitor BS w/ BMP       DVT prophylaxis: lovenox  Code Status: full  Family Communication: discussed pt's care w/ pt's family at bedside and answered their questions Disposition Plan: unclear   Level of care: Stepdown  Status is: Inpatient  Remains inpatient appropriate because:Ongoing diagnostic testing needed not appropriate for outpatient work up, Unsafe d/c plan, IV treatments appropriate due to intensity of illness or inability to take PO, and Inpatient level of care appropriate due to severity of illness  Dispo: The patient is from: Home              Anticipated d/c is to:  unclear              Patient currently is not medically stable to d/c.   Difficult to place patient Yes   Consultants:  Palliative care Onco Cardio Pulmon   Procedures:   Antimicrobials:    Subjective: Pt c/o fatigue  Objective: Vitals:   04/08/21 0530 04/08/21 0545 04/08/21 0600 04/08/21 0615  BP:      Pulse: 71 81 (!) 116 79  Resp:   20 19  Temp:      TempSrc:      SpO2: 97% 97% 95% 97%  Weight:      Height:        Intake/Output Summary (Last 24 hours) at 04/08/2021 0803 Last data filed at 04/08/2021 0600 Gross per 24 hour  Intake 120 ml  Output 250 ml  Net -130 ml   Filed Weights   04/04/21 0500 04/05/21 0345 04/06/21 0500  Weight: 61.4 kg 62 kg 62 kg    Examination:  General exam: Appears comfortable   Respiratory system: diminished breath sounds b/l  Cardiovascular system: S1 & S2+. No rubs or clicks Gastrointestinal system: Abd is soft, NT, ND & hypoactive bowel sounds  Central nervous system: Alert and oriented. Moves all extremities   Psychiatry: Judgement and insight appear normal. Flat mood and affect    Data Reviewed: I have personally reviewed following labs and imaging studies  CBC: Recent Labs  Lab 04/02/21 0209 04/03/21 0327 04/04/21 0445 04/05/21 0549 04/06/21 0537 04/07/21 0524 04/08/21 0512  WBC 17.8*   < > 32.0* 28.7* 23.6* 27.4* 21.7*   NEUTROABS 12.6*  --   --   --   --   --   --   HGB 14.0   < > 14.4 13.7 14.2 14.4 13.7  HCT 41.7   < > 43.4 40.8 42.7 43.4 41.8  MCV 86.0   < > 87.7 89.3 88.6 87.9 86.5  PLT 440*   < > 549* 524* 565* 600* 499*   < > = values in this interval not displayed.   Basic Metabolic Panel: Recent Labs  Lab 04/02/21 0209 04/03/21 0327 04/05/21 0549 04/06/21 0537 04/06/21 1628 04/07/21 0524 04/08/21 0512  NA 129* 131* 134* 137  --  134* 135  K 3.5 4.2 4.4 5.4* 4.8 4.3 5.1  CL 88* 92* 95* 97*  --  95* 101  CO2 32 33* 29 31  --  30 29  GLUCOSE 152* 173* 196* 175*  --  123* 130*  BUN 11 8 20 16   --  23* 22*  CREATININE 0.52 0.47 0.64 0.56  --  0.63 0.70  CALCIUM 8.3* 8.8* 9.0 9.4  --  9.5 8.9  MG 2.1 2.4 2.2 2.3  --  2.9* 2.6*  PHOS 3.3 3.6  --   --   --   --   --    GFR: Estimated Creatinine Clearance: 66.4 mL/min (by C-G formula based on SCr of 0.7 mg/dL). Liver Function Tests: Recent Labs  Lab 04/02/21 0209 04/03/21 0327 04/08/21 0512  AST 33 29 24  ALT 26 26 45*  ALKPHOS 63 71 81  BILITOT 0.5 0.8 0.5  PROT 6.4* 6.4* 5.8*  ALBUMIN 2.3* 2.2* 2.4*   No results for input(s): LIPASE, AMYLASE in the last 168 hours. No results for input(s): AMMONIA in the last 168 hours. Coagulation Profile: No results for input(s): INR, PROTIME in the last 168 hours. Cardiac Enzymes: No results for input(s): CKTOTAL, CKMB, CKMBINDEX, TROPONINI in the last 168 hours. BNP (last 3 results) No results for input(s): PROBNP in the last 8760 hours. HbA1C: No results for input(s): HGBA1C in the last 72 hours. CBG: No results for input(s): GLUCAP in the last 168 hours. Lipid Profile: No results for input(s): CHOL, HDL, LDLCALC, TRIG, CHOLHDL, LDLDIRECT in the last 72 hours. Thyroid Function Tests: Recent Labs    04/07/21 0524  TSH 4.241   Anemia Panel: No results for input(s): VITAMINB12, FOLATE, FERRITIN, TIBC, IRON, RETICCTPCT in the last 72 hours. Sepsis Labs: No results for input(s):  PROCALCITON, LATICACIDVEN in the last 168 hours.   No results found for this or any previous visit (from the past 240 hour(s)).        Radiology Studies: No results found.      Scheduled Meds:  amiodarone  400 mg Oral BID   arformoterol  15 mcg Nebulization BID   budesonide (PULMICORT) nebulizer solution  0.25 mg Nebulization BID   Chlorhexidine Gluconate Cloth  6 each  Topical Q0600   diltiazem  240 mg Oral Daily   docusate sodium  200 mg Oral BID   enoxaparin (LOVENOX) injection  40 mg Subcutaneous Q24H   feeding supplement  1 Container Oral TID BM   furosemide  40 mg Oral Daily   mouth rinse  15 mL Mouth Rinse BID   melatonin  2.5 mg Oral QHS   methylPREDNISolone (SOLU-MEDROL) injection  20 mg Intravenous Daily   multivitamin with minerals  1 tablet Oral Daily   nicotine  21 mg Transdermal Daily   nystatin  5 mL Oral QID   polyethylene glycol  17 g Oral Daily   propylthiouracil  100 mg Oral Q8H   revefenacin  175 mcg Nebulization Daily   sodium chloride  1 g Oral TID WC   Continuous Infusions:  sodium chloride Stopped (04/02/21 0226)     LOS: 12 days    Time spent: 33 mins    Wyvonnia Dusky, MD [facility] Pager [phone]  If 7PM-7AM, please contact night-coverage  04/08/2021, 8:03 AM

## 2021-04-09 DIAGNOSIS — C3490 Malignant neoplasm of unspecified part of unspecified bronchus or lung: Secondary | ICD-10-CM

## 2021-04-09 LAB — CBC
HCT: 43 % (ref 36.0–46.0)
Hemoglobin: 14.3 g/dL (ref 12.0–15.0)
MCH: 28.6 pg (ref 26.0–34.0)
MCHC: 33.3 g/dL (ref 30.0–36.0)
MCV: 86 fL (ref 80.0–100.0)
Platelets: 504 10*3/uL — ABNORMAL HIGH (ref 150–400)
RBC: 5 MIL/uL (ref 3.87–5.11)
RDW: 13.2 % (ref 11.5–15.5)
WBC: 20.1 10*3/uL — ABNORMAL HIGH (ref 4.0–10.5)
nRBC: 0 % (ref 0.0–0.2)

## 2021-04-09 LAB — COMPREHENSIVE METABOLIC PANEL
ALT: 40 U/L (ref 0–44)
AST: 24 U/L (ref 15–41)
Albumin: 2.7 g/dL — ABNORMAL LOW (ref 3.5–5.0)
Alkaline Phosphatase: 79 U/L (ref 38–126)
Anion gap: 10 (ref 5–15)
BUN: 20 mg/dL (ref 6–20)
CO2: 28 mmol/L (ref 22–32)
Calcium: 9.2 mg/dL (ref 8.9–10.3)
Chloride: 99 mmol/L (ref 98–111)
Creatinine, Ser: 0.66 mg/dL (ref 0.44–1.00)
GFR, Estimated: >60 mL/min (ref >60)
Glucose, Bld: 122 mg/dL — ABNORMAL HIGH (ref 70–99)
Potassium: 4.7 mmol/L (ref 3.5–5.1)
Sodium: 137 mmol/L (ref 135–145)
Total Bilirubin: 0.6 mg/dL (ref 0.3–1.2)
Total Protein: 6.2 g/dL — ABNORMAL LOW (ref 6.5–8.1)

## 2021-04-09 LAB — MAGNESIUM: Magnesium: 2.7 mg/dL — ABNORMAL HIGH (ref 1.7–2.4)

## 2021-04-09 MED ORDER — SODIUM CHLORIDE 0.9 % IV SOLN
1.5000 g | Freq: Four times a day (QID) | INTRAVENOUS | Status: DC
  Administered 2021-04-09: 1.5 g via INTRAVENOUS
  Filled 2021-04-09 (×2): qty 4
  Filled 2021-04-09: qty 1.5
  Filled 2021-04-09 (×2): qty 4

## 2021-04-09 MED ORDER — FUROSEMIDE 40 MG PO TABS: 40.0000 mg | ORAL_TABLET | Freq: Every day | ORAL | 0 refills | Status: DC

## 2021-04-09 MED ORDER — AMIODARONE HCL 200 MG PO TABS: 200.0000 mg | ORAL_TABLET | Freq: Two times a day (BID) | ORAL | 0 refills | Status: DC

## 2021-04-09 MED ORDER — DILTIAZEM HCL ER COATED BEADS 240 MG PO CP24: 240.0000 mg | ORAL_CAPSULE | Freq: Every day | ORAL | 0 refills | Status: DC

## 2021-04-09 MED ORDER — AMOXICILLIN-POT CLAVULANATE 875-125 MG PO TABS
1.0000 | ORAL_TABLET | Freq: Two times a day (BID) | ORAL | Status: DC
  Administered 2021-04-09: 1 via ORAL
  Filled 2021-04-09 (×2): qty 1

## 2021-04-09 MED ORDER — AMOXICILLIN-POT CLAVULANATE 875-125 MG PO TABS: 1.0000 | ORAL_TABLET | Freq: Two times a day (BID) | ORAL | 0 refills | Status: DC

## 2021-04-09 MED ORDER — PROPYLTHIOURACIL 50 MG PO TABS: 100.0000 mg | ORAL_TABLET | Freq: Three times a day (TID) | ORAL | 0 refills | Status: DC

## 2021-04-09 NOTE — Progress Notes (Signed)
[facility] night shift cardiac PCU unit coverage note.  The nursing staff communicated to [place] that the patient was unable to go home after planned discharge.  She no longer has an IV and was due to get Unasyn 1.5 g IV.  She was supposed to be getting Augmentin as an outpatient.  Augmentin 875 mg p.o. twice daily ordered.  Unasyn discontinued.  Please see discharge summary for further detail.  Tennis Must, MD.

## 2021-04-09 NOTE — Discharge Summary (Addendum)
Physician Discharge Summary  Nichole Park:[id number] DOB: 02/04/1961 DOA: 03/27/2021  PCP: Merryl Hacker, No  Admit date: 03/27/2021 Discharge date: 04/10/2021  Admitted From: home  Disposition:  home   Recommendations for Outpatient Follow-up:  Follow up with PCP in 1-2 weeks F/u w/ cardio, Dr. Rockey Situ in 2 weeks F/u w/ onco, Dr. Rogue Bussing within 1 week  Home Health:  Equipment/Devices:  Discharge Condition: stable  CODE STATUS:full Diet recommendation: regular  Brief/Interim Summary: HPI was taken fromNP Ouma: A 60 year old  female with no pertinent medical history  other than remote hx of Graves Disease and current everyday smoker who presented to the ED from [facility] walk in Clinic with sharp epigastric pain, progressive SOB onset 3 weeks ago associated with generalized weakness.   Patient reported generalized weakness and mild left-sided chest discomfort that worsened with inspiration and was not associated with exertion. She reports intermittent systemic symptoms of fevers, and weight loss (8-10lbs over a month) , productive cough, and dark urine. She denied cardiovascular symptoms of orthopnea, paroxysmal nocturnal dyspnea, hemoptysis, palpitations, and leg swelling, respiratory symptoms including runny rose, sore throat, sneezing, and cough, gastrointestinal symptoms of vomiting and bowel habit changes. She has history of cigarette smoking 1 pack /day since the age of 60 years old. The patient denied chest and abdominal trauma and recent long-distance travel. When the symptoms worsened yesterday, she went to walk in clinic who sent him to the emergency department for further evaluation of these symptoms.   ED Course: On arrival to the ED, she was afebrile with blood pressure 102/80 mm Hg and pulse rate 119 beats/min, RR 25 breaths per minute, oxygen saturation was over 95% on room airThere were no focal neurological deficits; she was alert and oriented x4, and he did not demonstrate any memory  deficits.  Laboratory evaluation was significant for  WBC/Hgb/Hct/Plts:  21.7, 21.1/15.7, 15.6/45.4, 46.6/467, 490 (07/19 1502). Na+ 131, CO2 20, glucose 144, BUN 34, otherwise unremarkable CMP. Other Labs: Baseline procalcitonin <0.10, TSH 8.150, Lactate: 5.3>3.9, hcg 64. UA: Positive for UTI, Pregnancy, Urine POSITIVE. Electrocardiography (EKG) showed sinus tachycardia rate of 123 normal axis no acute ST-T wave changes . chest X-ray revealed 4.8 cm mass within the right upper lobe. CTA Chest: Spiculated 4.7 cm right upper lobe mass consistent with malignancy. Diffuse interlobular septal thickening throughout the right lung concerning for lymphangitic spread of disease.2. Large pericardial effusion. 3. Lymphadenopathy within the mediastinum, right supraclavicular region, and left axilla, concerning for metastatic disease.  Given abnormal  findings as above, cardiology was consulted who subsequently performed a bedside echocardiography (ECHO) showing cardiac tamponade physiology.Pulses paradoxus was also abnormal at 20-30 mm Hg. Due to progressive symptoms and findings as above, patient was taken to the cath lab urgently for pericardiocentesis and pericardial drain placement. 500cc of serosanguinous fluid was removed which improved the patient's symptoms remarkably. PCCM consulted for admission to the ICU.   As per Dr. Si Raider:  Hx graves disease, presenting with shortness of breath and chest discomfort, found to have large right upper lobar mass and pericardial effusion w/ tamponade physiology. S/p pericardial drain.  As per Dr. Jimmye Norman 7/27-04/09/21: When I started seeing the pt, there pericardial drain had already been removed. 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 Pt also considered during a "holistic approach" and also getting a second opinion at [facility]. Pt  finally agreed she wanted to start treatment under the care of Dr. Rogue Bussing. Pt will f/u w/ onco as an outpatient to schedule treatments. Of note, pt was able to weaned off supplemental oxygen prior to d/c. Pt was ambulating and transferring independently. For more information, please see previous progress/consult notes.    Discharge Diagnoses:  Principal Problem:   Cardiac tamponade Active Problems:   Cardiac/pericardial tamponade   Malignant pericardial effusion (HCC)   Tobacco use   PAF (paroxysmal atrial fibrillation) (HCC)   COPD (chronic obstructive pulmonary disease) (HCC)   Lung mass   Palliative care encounter  Pericardial effusion: w/ cardiac tamponade. S/p pericardiocentesis with 400 ml drainage. Drain has since been removed. Cytology confirms malignancy, special stains are pending. Continue on lasix. CTA neg. TTE shows preserved EF, diastolic dysfunction, no reaccumulation of fluid but pericardial fat vs malignancy, poss still tamponade.    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 Final pathology is pending still. Not candidate currently for EBUS with biopsy if/when patient stable, likely as outpt as per Dr. Patsey Berthold. Chemoimmunotherapy recommended by onco but pt was initially deciding on doing a "holistic approach" and/or a second opinion from [facility] but finally decided to start treatment under the care of Dr. Rogue Bussing. Pt will f/u outpatient w onco to start treatments. Onco, palliative care are following and recs apprec    Acute hypoxic respiratory failure: secondary to lung cancer w/ lymphangitic spread.  Resolved  Likely PAF: w/ intermittent RVR. High risk for PE but anticoagulation is contraindicated. Continue on cardizem, amio as per cardio    Thrombocytosis: labile, etiology unclear    Hyperkalemia: resolved   Hyponatremia: resolved   Leukocytosis: likely secondary to steroid use. Continue on steroid wean    Graves Disease: w/ hx diffuse goiter and  elevated TSIs (in care everywhere). Hx rash w/ methimazole, stopped that several years ago and hasn't had f/u. Continue on propylthiouricil & will monitor LFTs. TSH is WNL   UTI: urine cx growing e. coli, enterococcus, sens pending. Given augmentin at d/c as sens were not available    Pre-DM: HbA1c 6.2. Continue to monitor on BS w/ BMP    Discharge Instructions  Discharge Instructions     Diet general   Complete by: As directed    Discharge instructions   Complete by: As directed    F/u w/ cardio, Dr. Rockey Situ, in 2 weeks. F/u w/ onco, Dr. Rogue Bussing, within 1 week. F/u w/ PCP in 1-2 weeks   Increase activity slowly   Complete by: As directed    No wound care   Complete by: As directed       Allergies as of 04/10/2021       Reactions   Metoprolol    Tapazole [methimazole]         Medication List     TAKE these medications    albuterol 108 (90 Base) MCG/ACT inhaler Commonly known as: VENTOLIN HFA Inhale 2 puffs into the lungs every 6 (six) hours as needed for wheezing or shortness of breath. What changed: reasons to take this   amiodarone 200 MG tablet Commonly known as: PACERONE Take 1 tablet (200 mg total) by mouth 2 (two) times daily.   diltiazem 240 MG 24 hr capsule Commonly known as: DILACOR XR Take 1 capsule (240 mg total) by mouth once daily.   furosemide 40 MG tablet Commonly known as: LASIX Take 1 tablet (40 mg total) by mouth once daily.   nitrofurantoin (macrocrystal-monohydrate) 100 MG capsule  Commonly known as: Macrobid Take 1 capsule (100 mg total) by mouth 2 (two) times daily for 5 days.   propylthiouracil 50 MG tablet Commonly known as: PTU Take 2 tablets (100 mg total) by mouth every 8 (eight) hours.       ASK your doctor about these medications    Multi-Vitamins Tabs Take 1 tablet by mouth daily.        Follow-up Information     Gollan, Kathlene November, MD. Schedule an appointment as soon as possible for a visit in 1 week(s).    Specialty: Cardiology Contact information: [place] [address] [phone]         Cammie Sickle, MD. Schedule an appointment as soon as possible for a visit in 1 week(s).   Specialties: Internal Medicine, Oncology Contact information: [place] [address] [phone]                Allergies  Allergen Reactions   Metoprolol    Tapazole [Methimazole]     Consultations: Onco Cardio ICU   Procedures/Studies: DG Chest 1 View  Result Date: 03/30/2021 CLINICAL DATA:  Possible pneumothorax EXAM: CHEST  1 VIEW COMPARISON:  03/29/2021 FINDINGS: Spiculated right upper lobe lung mass with relative hyperlucency in the vicinity but without change, prior chest CT did not demonstrate corresponding abnormality other than emphysema. No discrete pleural line is seen. Small left pleural effusion. Mild cardiomegaly with increasing vascular congestion and pulmonary edema. Subsegmental atelectasis at the left lung base. Drainage catheter appears removed. IMPRESSION: 1. Mild cardiomegaly with increasing vascular congestion/mild pulmonary edema and small left effusion 2. Spiculated right upper lobe lung mass. Asymmetrical hyperlucency surrounding the mass without change and no definitive pleural line to suggest pneumothorax. Electronically Signed   By: Donavan Foil M.D.   On: 03/30/2021 01:11   DG Chest 1 View  Result Date: 03/27/2021 CLINICAL DATA:  Shortness of breath EXAM: CHEST  1 VIEW COMPARISON:  None. FINDINGS: Cardiomegaly. 4.8 cm mass projects within the right upper lobe. Mildly hyperexpanded lungs with coarsened interstitial markings bilaterally. No pleural effusion or pneumothorax. No acute bony findings. IMPRESSION: 1. 4.8 cm mass within the right upper lobe. CT chest with contrast is recommended for further evaluation. 2. Cardiomegaly. Electronically Signed   By: Davina Poke D.O.   On: 03/27/2021 15:52   CT Angio  Chest PE W and/or Wo Contrast  Result Date: 03/27/2021 CLINICAL DATA:  Tachycardia, short of breath, pleuritic chest pain, abnormal chest x-ray EXAM: CT ANGIOGRAPHY CHEST WITH CONTRAST TECHNIQUE: Multidetector CT imaging of the chest was performed using the standard protocol during bolus administration of intravenous contrast. Multiplanar CT image reconstructions and MIPs were obtained to evaluate the vascular anatomy. CONTRAST:  133mL OMNIPAQUE IOHEXOL 350 MG/ML SOLN COMPARISON:  03/27/2021 FINDINGS: Cardiovascular: This is a technically adequate evaluation of the pulmonary vasculature. No filling defects or pulmonary emboli. There is a large pericardial effusion measuring up to 2.3 cm in thickness. Uniform decreased attenuation suggest transudative effusion. Normal caliber of the thoracic aorta. Mild atherosclerosis of the aorta and coronary vasculature. Mediastinum/Nodes: Pathologically enlarged lymph nodes are seen within the mediastinum, measuring up to 13 mm in short axis reference image 24/4. Adenopathy is also seen within the right supraclavicular region and left axilla. Thyroid, trachea, and esophagus are grossly unremarkable. Lungs/Pleura: There is a large spiculated right upper lobe mass corresponding to the chest x-ray finding, measuring 4.7 x 3.8 by 4.0 cm. Diffuse interlobular septal thickening  throughout the right lung concerning for lymphangitic spread of disease. There is background emphysema. No effusion or pneumothorax. Central airways are patent. Upper Abdomen: No acute abnormality. Musculoskeletal: No acute or destructive bony lesions. Reconstructed images demonstrate no additional findings. Review of the MIP images confirms the above findings. IMPRESSION: 1. 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 Diffuse interlobular septal thickening throughout the right lung concerning for lymphangitic spread of disease. 2. Large pericardial effusion. 3. Lymphadenopathy within  the mediastinum, right supraclavicular region, and left axilla, concerning for metastatic disease. 4. No evidence of pulmonary embolus. 5. Aortic Atherosclerosis (ICD10-I70.0) and Emphysema (ICD10-J43.9). Electronically Signed   By: Randa Ngo M.D.   On: 03/27/2021 18:24   CARDIAC CATHETERIZATION  Result Date: 03/27/2021 Conclusions: Successful pericardiocentesis and tube pericardiotomy via the subxiphoid approach, yielding 500 mL of serosanginous fluid. Recommendations: Admit to ICU. Obtain STAT portable CXR. Follow-up fluid analyses, including cytology. Repeat echocardiogram tomorrow.  Anticipate removal of drain when output is less than 50-100 mL/24 hours and minimal residual fluid is present by echo. Nelva Bush, MD [facility]  US Renal  Result Date: 03/27/2021 CLINICAL DATA:  UTI EXAM: RENAL / URINARY TRACT ULTRASOUND COMPLETE COMPARISON:  CT 03/27/2021 FINDINGS: Right Kidney: Renal measurements: 10.9 x 4 x 4.4 cm = volume: 101 mL. Echogenicity within normal limits. No mass or hydronephrosis visualized. Left Kidney: Renal measurements: 11.1 x 4.9 x 5.8 cm = volume: 165 mL. Echogenicity within normal limits. No mass or hydronephrosis visualized. Bladder: Appears normal for degree of bladder distention. Other: None. IMPRESSION: Negative renal ultrasound Electronically Signed   By: Donavan Foil M.D.   On: 03/27/2021 19:37   DG Chest Port 1 View  Result Date: 04/04/2021 CLINICAL DATA:  Respiratory failure. EXAM: PORTABLE CHEST 1 VIEW COMPARISON:  04/01/2021.  CT 03/27/2021. FINDINGS: Mediastinum and hilar structures are stable. Borderline cardiomegaly. No pulmonary venous congestion. Persistent large mass right upper lung again noted. Diffuse bilateral interstitial prominence again noted. Although interstitial edema and/or pneumonitis could present this fashion, lymphangitic tumor spread again cannot be excluded. Bibasilar atelectasis. Right base infiltrate. Right base pneumonia cannot be  excluded. Small bilateral pleural effusions again noted. No acute bony abnormality identified. IMPRESSION: 1.  Large right upper lung mass again noted without interim change. 2. Diffuse bilateral interstitial prominence again noted. Although interstitial edema and/or pneumonitis could present in this fashion, lymphangitic tumor spread again cannot be excluded. 3. Bibasilar atelectasis. Right base infiltrate. Right base pneumonia cannot be excluded. Small bilateral pleural effusions again noted. 4.  Borderline cardiomegaly. Electronically Signed   By: Marcello Moores  Register   On: 04/04/2021 07:53   DG Chest Port 1 View  Result Date: 04/01/2021 CLINICAL DATA:  Shortness of breath EXAM: PORTABLE CHEST 1 VIEW COMPARISON:  CT 03/27/2021, radiograph 03/30/2021 FINDINGS: Diminishing lung volumes with increasing veiling opacity in the lung bases, right greater than left which could reflect developing pleural effusions. Pulmonary vascularity is indistinct. Cardiomediastinal silhouette is stable from prior with known large pericardial effusion. Redemonstration of a large rounded masslike opacity in the right upper lung corresponding to the known right upper lung malignancy. No acute or worrisome osseous or soft tissue abnormality of the chest wall. IMPRESSION: Diminishing lung volumes. Increasing veiling opacity may reflect developing pleural effusions. Additional opacity in the bases may reflect some passive atelectatic change or underlying airspace opacity. Diffusely worsening interstitial opacity, concerning for developing edema as well given pulmonary vascular congestion. Underlying lymphangitic spread is not fully excluded. Redemonstrated masslike opacity in  the right upper lobe. Electronically Signed   By: Lovena Le M.D.   On: 04/01/2021 21:16   DG Chest Port 1 View  Result Date: 03/29/2021 CLINICAL DATA:  Shortness of breath and chest pain EXAM: PORTABLE CHEST 1 VIEW COMPARISON:  03/27/2021 FINDINGS: Cardiac  shadow is mildly enlarged but stable. Spiculated right upper lobe mass lesion is again noted. Mild increase in parenchymal edema is seen. Small bore catheter is again noted over the cardiac shadow likely related to a pericardial drain. No pneumothorax is noted. IMPRESSION: Increasing edema particularly on the right. Stable right upper lobe mass lesion. No new focal abnormality is noted. Electronically Signed   By: Inez Catalina M.D.   On: 03/29/2021 09:02   DG Chest Port 1 View  Result Date: 03/27/2021 CLINICAL DATA:  Cardiac tamponade EXAM: PORTABLE CHEST 1 VIEW COMPARISON:  Chest x-ray 03/27/2021, CT chest 03/27/2021 FINDINGS: Right upper lobe lung mass redemonstrated. Emphysema with bronchitic changes. Cardiomegaly. Interim placement of drainage catheter over the heart presumably a pericardial drainage catheter. Cardiac silhouette decreased compared to prior. Slight increased interstitial opacity possibly superimposed edema. No pneumothorax IMPRESSION: 1. Placement of presumed pericardial drainage catheter with decreased cardiomegaly 2. Emphysema with chronic bronchitic change, slight increased interstitial opacity may reflect superimposed mild edema 3. Redemonstrated spiculated lung mass at the right apex Electronically Signed   By: Donavan Foil M.D.   On: 03/27/2021 22:28   ECHOCARDIOGRAM COMPLETE  Result Date: 03/28/2021    ECHOCARDIOGRAM REPORT   Patient Name:   Nichole Park Date of Exam: 03/28/2021 Medical Rec #:  [id number]   Height:       61.5 in Accession #:    [id number]  Weight:       128.7 lb Date of Birth:  03-24-1961  BSA:          1.576 m Patient Age:    21 years    BP:           128/88 mmHg Patient Gender: F           HR:           105 bpm. Exam Location:  [facility] Procedure: 2D Echo, Color Doppler, Cardiac Doppler and Intracardiac            Opacification Agent Indications:     I31.4 Tamponade  History:         Patient has no prior history of Echocardiogram examinations.                   Signs/Symptoms:Shortness of Breath; Risk Factors:Current                  Smoker. Graves disease.  Sonographer:     Charmayne Sheer RDCS (AE) Referring Phys:  [id number] CHRISTOPHER END Diagnosing Phys: Kate Sable MD  Sonographer Comments: Technically difficult study due to poor echo windows. IMPRESSIONS  1. Left ventricular ejection fraction, by estimation, is 55%. The left ventricle has normal function. The left ventricle has no regional wall motion abnormalities. Left ventricular diastolic parameters are consistent with Grade II diastolic dysfunction (pseudonormalization).  2. Right ventricular systolic function is normal. The right ventricular size is normal.  3. The mitral valve is normal in structure. No evidence of mitral valve regurgitation.  4. The aortic valve was not well visualized. Aortic valve regurgitation is not visualized.  5. The inferior vena cava is dilated in size with >50% respiratory variability, suggesting right atrial pressure of 8 mmHg. FINDINGS  Left Ventricle:  Left ventricular ejection fraction, by estimation, is 55%. The left ventricle has normal function. The left ventricle has no regional wall motion abnormalities. Definity contrast agent was given IV to delineate the left ventricular endocardial borders. The left ventricular internal cavity size was normal in size. There is no left ventricular hypertrophy. Left ventricular diastolic parameters are consistent with Grade II diastolic dysfunction (pseudonormalization). Right Ventricle: The right ventricular size is normal. No increase in right ventricular wall thickness. Right ventricular systolic function is normal. Left Atrium: Left atrial size was normal in size. Right Atrium: Right atrial size was normal in size. Pericardium: Trivial pericardial effusion is present. Mitral Valve: The mitral valve is normal in structure. No evidence of mitral valve regurgitation. Tricuspid Valve: The tricuspid valve is not well visualized. Tricuspid valve  regurgitation is not demonstrated. Aortic Valve: The aortic valve was not well visualized. Aortic valve regurgitation is not visualized. Aortic valve mean gradient measures 2.0 mmHg. Aortic valve peak gradient measures 4.7 mmHg. Aortic valve area, by VTI measures 3.44 cm. Pulmonic Valve: The pulmonic valve was not well visualized. Pulmonic valve regurgitation is not visualized. Aorta: The aortic root is normal in size and structure. Venous: The inferior vena cava is dilated in size with greater than 50% respiratory variability, suggesting right atrial pressure of 8 mmHg. IAS/Shunts: No atrial level shunt detected by color flow Doppler.  LEFT VENTRICLE PLAX 2D LVIDd:         3.90 cm  Diastology LVIDs:         3.30 cm  LV e' medial:    9.46 cm/s LV PW:         1.20 cm  LV E/e' medial:  8.9 LV IVS:        0.90 cm  LV e' lateral:   4.57 cm/s LVOT diam:     2.20 cm  LV E/e' lateral: 18.4 LV SV:         54 LV SV Index:   34 LVOT Area:     3.80 cm  LEFT ATRIUM           Index LA diam:      3.70 cm 2.35 cm/m LA Vol (A4C): 38.0 ml 24.11 ml/m  AORTIC VALVE                   PULMONIC VALVE AV Area (Vmax):    3.40 cm    PV Vmax:       0.97 m/s AV Area (Vmean):   3.66 cm    PV Vmean:      65.700 cm/s AV Area (VTI):     3.44 cm    PV VTI:        0.122 m AV Vmax:           108.00 cm/s PV Peak grad:  3.7 mmHg AV Vmean:          71.900 cm/s PV Mean grad:  2.0 mmHg AV VTI:            0.156 m AV Peak Grad:      4.7 mmHg AV Mean Grad:      2.0 mmHg LVOT Vmax:         96.60 cm/s LVOT Vmean:        69.300 cm/s LVOT VTI:          0.141 m LVOT/AV VTI ratio: 0.90  AORTA Ao Root diam: 2.80 cm MITRAL VALVE MV Area (PHT): 5.07 cm    SHUNTS MV Decel Time:  150 msec    Systemic VTI:  0.14 m MV E velocity: 84.23 cm/s  Systemic Diam: 2.20 cm MV A velocity: 88.23 cm/s MV E/A ratio:  0.95 Kate Sable MD Electronically signed by Kate Sable MD Signature Date/Time: 03/28/2021/1:51:00 PM    Final    ECHOCARDIOGRAM LIMITED  Result  Date: 04/02/2021    ECHOCARDIOGRAM LIMITED REPORT   Patient Name:   Nichole Park Date of Exam: 04/02/2021 Medical Rec #:  [id number]   Height:       61.5 in Accession #:    [id number]  Weight:       137.6 lb Date of Birth:  Sep 13, 1960  BSA:          1.621 m Patient Age:    66 years    BP:           106/77 mmHg Patient Gender: F           HR:           87 bpm. Exam Location:  [facility] Procedure: Limited Echo, Color Doppler and Cardiac Doppler Indications:     Pericardial Effusion I31.3  History:         Patient has prior history of Echocardiogram examinations, most                  recent 03/29/2021. COPD; Risk Factors:Hypertension.  Sonographer:     Sherrie Sport RDCS (AE) Referring Phys:  [id number] [place] Diagnosing Phys: Harrell Gave End MD  Sonographer Comments: Image acquisition challenging due to COPD. IMPRESSIONS  1. Left ventricular ejection fraction, by estimation, is >55%. The left ventricle has normal function.  2. Right ventricular systolic function is normal. Moderately increased right ventricular wall thickness.  3. Echogenic material in the pericardial space is noted, which could represent epicardial fat. However, in the setting of malignant pericardial effusion, tumor cannot be excluded. FINDINGS  Left Ventricle: Left ventricular ejection fraction, by estimation, is >55%. The left ventricle has normal function. The left ventricular internal cavity size was normal in size. There is borderline left ventricular hypertrophy. Right Ventricle: Moderately increased right ventricular wall thickness. Right ventricular systolic function is normal. Pericardium: Echogenic material in the pericardial space is noted, which could represent epicardial fat. However, in the setting of malignant pericardial effusion, tumor cannot be excluded. Trivial pericardial effusion is present. LEFT VENTRICLE PLAX 2D LVIDd:         4.23 cm LVIDs:         2.58 cm LV PW:         1.01 cm LV IVS:        1.01 cm LVOT diam:     2.00 cm LVOT  Area:     3.14 cm  LEFT ATRIUM         Index LA diam:    3.60 cm 2.22 cm/m   AORTA Ao Root diam: 2.90 cm  SHUNTS Systemic Diam: 2.00 cm Nelva Bush MD Electronically signed by Nelva Bush MD Signature Date/Time: 04/02/2021/12:36:25 PM    Final    ECHOCARDIOGRAM LIMITED  Result Date: 03/29/2021    ECHOCARDIOGRAM LIMITED REPORT   Patient Name:   Nichole Park Date of Exam: 03/29/2021 Medical Rec #:  [id number]   Height:       61.5 in Accession #:    [id number]  Weight:       136.5 lb Date of Birth:  1960-09-28  BSA:          1.615 m Patient  Age:    73 years    BP:           137/91 mmHg Patient Gender: F           HR:           116 bpm. Exam Location:  [facility] Procedure: Cardiac Doppler, Color Doppler and Limited Echo Indications:     Pericardial Effusion I31.3  History:         Patient has prior history of Echocardiogram examinations, most                  recent 03/28/2021. Risk Factors:Hypertension. Cardiac tamponade.  Sonographer:     Sherrie Sport RDCS (AE) Referring Phys:  [id number] [place] Diagnosing Phys: Ida Rogue MD  Sonographer Comments: Technically difficult study due to poor echo windows. IMPRESSIONS  1. Trvial pericardial effusion.  2. Left ventricular ejection fraction, by estimation, is 60 to 65%. The left ventricle has normal function. The left ventricle has no regional wall motion abnormalities.  3. Right ventricular systolic function is normal. The right ventricular size is normal.  4. The mitral valve is normal in structure. No evidence of mitral valve regurgitation. No evidence of mitral stenosis. FINDINGS  Left Ventricle: Left ventricular ejection fraction, by estimation, is 60 to 65%. The left ventricle has normal function. The left ventricle has no regional wall motion abnormalities. The left ventricular internal cavity size was normal in size. There is  no left ventricular hypertrophy. Right Ventricle: The right ventricular size is normal. No increase in right ventricular wall  thickness. Right ventricular systolic function is normal. Left Atrium: Left atrial size was normal in size. Right Atrium: Right atrial size was normal in size. Pericardium: Trivial pericardial effusion is present. Mitral Valve: The mitral valve is normal in structure. No evidence of mitral valve stenosis. Tricuspid Valve: The tricuspid valve is normal in structure. Tricuspid valve regurgitation is not demonstrated. No evidence of tricuspid stenosis. Aortic Valve: The aortic valve was not well visualized. Aortic valve regurgitation is not visualized. No aortic stenosis is present. Pulmonic Valve: The pulmonic valve was normal in structure. Pulmonic valve regurgitation is not visualized. No evidence of pulmonic stenosis. Aorta: The aortic root is normal in size and structure. Venous: The inferior vena cava is normal in size with greater than 50% respiratory variability, suggesting right atrial pressure of 3 mmHg. IAS/Shunts: No atrial level shunt detected by color flow Doppler. LEFT VENTRICLE PLAX 2D LVIDd:         4.04 cm LVIDs:         2.55 cm LV PW:         1.31 cm LV IVS:        0.93 cm LVOT diam:     2.00 cm LVOT Area:     3.14 cm  LEFT ATRIUM           Index      RIGHT ATRIUM           Index LA diam:      3.10 cm 1.92 cm/m RA Area:     10.20 cm LA Vol (A4C): 11.2 ml 6.93 ml/m RA Volume:   23.00 ml  14.24 ml/m   AORTA Ao Root diam: 2.80 cm TRICUSPID VALVE TR Peak grad:   4.8 mmHg TR Vmax:        110.00 cm/s  SHUNTS Systemic Diam: 2.00 cm Ida Rogue MD Electronically signed by Ida Rogue MD Signature Date/Time: 03/29/2021/3:32:05 PM  Final    (Echo, Carotid, EGD, Colonoscopy, ERCP)    Subjective: Pt denies any complaints    Discharge Exam: Vitals:   04/10/21 0512 04/10/21 0739  BP: 105/67   Pulse: 85   Resp: 18   Temp: 98.2 F (36.8 C)   SpO2: 95% 94%   Vitals:   04/09/21 2003 04/09/21 2013 04/10/21 0512 04/10/21 0739  BP: 98/74  105/67   Pulse: 79  85   Resp: 20  18   Temp: 98  F (36.7 C)  98.2 F (36.8 C)   TempSrc:   Oral   SpO2: 94% 94% 95% 94%  Weight:   55.5 kg   Height:        General exam: Appears comfortable.  Respiratory system: decreased breath sounds b/l  Cardiovascular system: S1/S2+. No rubs or clicks  Gastrointestinal system: Abd is soft, NT,ND & hypoactive bowel sounds  Central nervous system: Alert and oriented. Moves all extremities   Psychiatry: Judgement and insight appear normal.    The results of significant diagnostics from this hospitalization (including imaging, microbiology, ancillary and laboratory) are listed below for reference.     Microbiology: Recent Results (from the past 240 hour(s))  Urine Culture     Status: Abnormal   Collection Time: 04/07/21 11:58 AM   Specimen: Urine, Clean Catch  Result Value Ref Range Status   Specimen Description   Final    URINE, CLEAN CATCH Performed at [facility], [address] [place], [place] [address]    Special Requests   Final    NONE Performed at [facility], [place], [place] [address]    Culture (A)  Final    30,000 COLONIES/mL ESCHERICHIA COLI 10,000 COLONIES/mL ENTEROCOCCUS FAECALIS    Report Status 04/10/2021 FINAL  Final   Organism ID, Bacteria ESCHERICHIA COLI (A)  Final   Organism ID, Bacteria ENTEROCOCCUS FAECALIS (A)  Final      Susceptibility   Escherichia coli - MIC*    AMPICILLIN >=32 RESISTANT Resistant     CEFAZOLIN >=64 RESISTANT Resistant     CEFEPIME 0.5 SENSITIVE Sensitive     CEFTRIAXONE >=64 RESISTANT Resistant     CIPROFLOXACIN >=4 RESISTANT Resistant     GENTAMICIN <=1 SENSITIVE Sensitive     IMIPENEM <=0.25 SENSITIVE Sensitive     NITROFURANTOIN <=16 SENSITIVE Sensitive     TRIMETH/SULFA <=20 SENSITIVE Sensitive     AMPICILLIN/SULBACTAM >=32 RESISTANT Resistant     PIP/TAZO 16 SENSITIVE Sensitive     * 30,000 COLONIES/mL ESCHERICHIA COLI   Enterococcus faecalis - MIC*    AMPICILLIN <=2 SENSITIVE  Sensitive     NITROFURANTOIN <=16 SENSITIVE Sensitive     VANCOMYCIN 1 SENSITIVE Sensitive     * 10,000 COLONIES/mL ENTEROCOCCUS FAECALIS     Labs: BNP (last 3 results) Recent Labs    04/03/21 0327  BNP 263.7*   Basic Metabolic Panel: Recent Labs  Lab 04/05/21 0549 04/06/21 0537 04/06/21 1628 04/07/21 0524 04/08/21 0512 04/09/21 0417 04/10/21 1048  NA 134* 137  --  134* 135 137 130*  K 4.4 5.4* 4.8 4.3 5.1 4.7 3.9  CL 95* 97*  --  95* 101 99 95*  CO2 29 31  --  30 29 28 26   GLUCOSE 196* 175*  --  123* 130* 122* 130*  BUN 20 16  --  23* 22* 20 15  CREATININE 0.64 0.56  --  0.63 0.70 0.66 0.56  CALCIUM 9.0 9.4  --  9.5 8.9 9.2 8.6*  MG 2.2 2.3  --  2.9* 2.6* 2.7*  --    Liver Function Tests: Recent Labs  Lab 04/08/21 0512 04/09/21 0417 04/10/21 1048  AST 24 24 22   ALT 45* 40 34  ALKPHOS 81 79 73  BILITOT 0.5 0.6 0.7  PROT 5.8* 6.2* 6.4*  ALBUMIN 2.4* 2.7* 2.8*   No results for input(s): LIPASE, AMYLASE in the last 168 hours. No results for input(s): AMMONIA in the last 168 hours. CBC: Recent Labs  Lab 04/06/21 0537 04/07/21 0524 04/08/21 0512 04/09/21 0417 04/10/21 0855  WBC 23.6* 27.4* 21.7* 20.1* 21.0*  HGB 14.2 14.4 13.7 14.3 14.8  HCT 42.7 43.4 41.8 43.0 44.8  MCV 88.6 87.9 86.5 86.0 85.8  PLT 565* 600* 499* 504* 486*   Cardiac Enzymes: No results for input(s): CKTOTAL, CKMB, CKMBINDEX, TROPONINI in the last 168 hours. BNP: Invalid input(s): POCBNP CBG: No results for input(s): GLUCAP in the last 168 hours. D-Dimer No results for input(s): DDIMER in the last 72 hours. Hgb A1c No results for input(s): HGBA1C in the last 72 hours. Lipid Profile No results for input(s): CHOL, HDL, LDLCALC, TRIG, CHOLHDL, LDLDIRECT in the last 72 hours. Thyroid function studies No results for input(s): TSH, T4TOTAL, T3FREE, THYROIDAB in the last 72 hours.  Invalid input(s): FREET3  Anemia work up No results for input(s): VITAMINB12, FOLATE, FERRITIN, TIBC,  IRON, RETICCTPCT in the last 72 hours. Urinalysis    Component Value Date/Time   COLORURINE AMBER (A) 03/27/2021 1701   APPEARANCEUR CLOUDY (A) 03/27/2021 1701   APPEARANCEUR Clear 12/01/2013 1439   LABSPEC 1.028 03/27/2021 1701   LABSPEC 1.005 12/01/2013 1439   PHURINE 5.0 03/27/2021 1701   GLUCOSEU 50 (A) 03/27/2021 1701   GLUCOSEU Negative 12/01/2013 1439   HGBUR NEGATIVE 03/27/2021 1701   BILIRUBINUR SMALL (A) 03/27/2021 1701   BILIRUBINUR Negative 12/01/2013 1439   KETONESUR NEGATIVE 03/27/2021 1701   PROTEINUR 30 (A) 03/27/2021 1701   NITRITE NEGATIVE 03/27/2021 1701   LEUKOCYTESUR MODERATE (A) 03/27/2021 1701   LEUKOCYTESUR 1+ 12/01/2013 1439   Sepsis Labs Invalid input(s): PROCALCITONIN,  WBC,  LACTICIDVEN Microbiology Recent Results (from the past 240 hour(s))  Urine Culture     Status: Abnormal   Collection Time: 04/07/21 11:58 AM   Specimen: Urine, Clean Catch  Result Value Ref Range Status   Specimen Description   Final    URINE, CLEAN CATCH Performed at [facility], [address] [place], [place] [address]    Special Requests   Final    NONE Performed at [facility], [address], [place], [place] [address]    Culture (A)  Final    30,000 COLONIES/mL ESCHERICHIA COLI 10,000 COLONIES/mL ENTEROCOCCUS FAECALIS    Report Status 04/10/2021 FINAL  Final   Organism ID, Bacteria ESCHERICHIA COLI (A)  Final   Organism ID, Bacteria ENTEROCOCCUS FAECALIS (A)  Final      Susceptibility   Escherichia coli - MIC*    AMPICILLIN >=32 RESISTANT Resistant     CEFAZOLIN >=64 RESISTANT Resistant     CEFEPIME 0.5 SENSITIVE Sensitive     CEFTRIAXONE >=64 RESISTANT Resistant     CIPROFLOXACIN >=4 RESISTANT Resistant     GENTAMICIN <=1 SENSITIVE Sensitive     IMIPENEM <=0.25 SENSITIVE Sensitive     NITROFURANTOIN <=16 SENSITIVE Sensitive     TRIMETH/SULFA <=20 SENSITIVE Sensitive     AMPICILLIN/SULBACTAM >=32 RESISTANT Resistant     PIP/TAZO 16  SENSITIVE Sensitive     *  30,000 COLONIES/mL ESCHERICHIA COLI   Enterococcus faecalis - MIC*    AMPICILLIN <=2 SENSITIVE Sensitive     NITROFURANTOIN <=16 SENSITIVE Sensitive     VANCOMYCIN 1 SENSITIVE Sensitive     * 10,000 COLONIES/mL ENTEROCOCCUS FAECALIS     Time coordinating discharge: Over 30 minutes  SIGNED:   Wyvonnia Dusky, MD  [facility] 04/10/2021, 12:47 PM Pager   If 7PM-7AM, please contact night-coverage

## 2021-04-09 NOTE — Evaluation (Signed)
Occupational Therapy Evaluation Patient Details Name: Nichole Park MRN: [id number] DOB: 26-Jan-1961 Today's Date: 04/09/2021    History of Present Illness 60 y.o. female with history of Graves' disease, tobacco use of 40+ years who presented with SOB and was found to have a large pericardial effusion requiring emergent pericardiocentesis, as well as a right upper lobe mass suspicious for malignancy   Clinical Impression   Patient presenting with decreased I in self care, balance, functional mobility, transfers, endurance, and safety awareness. Patient reports being progressively weaker at home and needing increased assistance from significant other for self care tasks. Family assists with IADLs secondary to pt fatigue. Pt demonstrates ability to ambulate in room with close supervision and furniture walking with min cuing for safety awareness. Pt stands at sink for grooming and ambulates to bathroom for toileting needs this session. Pt with questions regarding CA, food choices, and free radicals. OT asked her to direct questions to MD.  Patient will benefit from acute OT to increase overall independence in the areas of ADLs, functional mobility, and safety awareness in order to safely discharge home with family. Pt needs increased education on energy conservation for community and home.    Follow Up Recommendations  Home health OT;Supervision - Intermittent    Equipment Recommendations  None recommended by OT       Precautions / Restrictions Precautions Precautions: Fall      Mobility Bed Mobility               General bed mobility comments: seated in recliner chair    Transfers Overall transfer level: Needs assistance Equipment used: None Transfers: Stand Pivot Transfers;Sit to/from Stand Sit to Stand: Supervision Stand pivot transfers: Supervision       General transfer comment: close supervison for safety with min cuing for technique    Balance Overall balance  assessment: Mild deficits observed, not formally tested                                         ADL either performed or assessed with clinical judgement   ADL Overall ADL's : Needs assistance/impaired                                       General ADL Comments: supervision overall for mobility with some furniture walking noted. Pt standing to brush teeth and ambulates to bathroom for toileting needs. No physical assist but supervision for safety.     Vision Baseline Vision/History: No visual deficits Patient Visual Report: No change from baseline              Pertinent Vitals/Pain Pain Assessment: No/denies pain     Hand Dominance Right   Extremity/Trunk Assessment Upper Extremity Assessment Upper Extremity Assessment: Overall WFL for tasks assessed   Lower Extremity Assessment Lower Extremity Assessment: Overall WFL for tasks assessed   Cervical / Trunk Assessment Cervical / Trunk Assessment: Normal   Communication Communication Communication: No difficulties   Cognition Arousal/Alertness: Awake/alert Behavior During Therapy: WFL for tasks assessed/performed Overall Cognitive Status: Within Functional Limits for tasks assessed  Home Living Family/patient expects to be discharged to:: Private residence Living Arrangements: Spouse/significant other Available Help at Discharge: Family;Available 24 hours/day Type of Home: Mobile home Home Access: Stairs to enter Entrance Stairs-Number of Steps: 1 in front and 6 in back Entrance Stairs-Rails: None Home Layout: One level     Bathroom Shower/Tub: Tub only   Biochemist, clinical: Standard     Home Equipment: None          Prior Functioning/Environment Level of Independence: Needs assistance  Gait / Transfers Assistance Needed: Pt reports independence with mobility at baseline ADL's / Homemaking Assistance Needed:  Assistance to get in/out of tub and to wash hair. Partner assists with home management secondary to fatigue            OT Problem List: Decreased activity tolerance;Decreased safety awareness;Impaired balance (sitting and/or standing);Decreased strength;Decreased knowledge of use of DME or AE      OT Treatment/Interventions: Self-care/ADL training;Manual therapy;Therapeutic exercise;Energy conservation;DME and/or AE instruction;Cognitive remediation/compensation;Therapeutic activities;Neuromuscular education;Balance training;Patient/family education    OT Goals(Current goals can be found in the care plan section) Acute Rehab OT Goals Patient Stated Goal: to get better OT Goal Formulation: With patient Time For Goal Achievement: 04/23/21 Potential to Achieve Goals: Good ADL Goals Pt Will Perform Grooming: with modified independence Pt Will Perform Upper Body Bathing: with modified independence Pt Will Perform Lower Body Bathing: with modified independence Pt Will Transfer to Toilet: with modified independence Pt Will Perform Toileting - Clothing Manipulation and hygiene: with modified independence  OT Frequency: Min 2X/week   Barriers to D/C:    none known at this time          AM-PAC OT "6 Clicks" Daily Activity     Outcome Measure Help from another person eating meals?: None Help from another person taking care of personal grooming?: A Little Help from another person toileting, which includes using toliet, bedpan, or urinal?: A Little Help from another person bathing (including washing, rinsing, drying)?: A Little Help from another person to put on and taking off regular upper body clothing?: None Help from another person to put on and taking off regular lower body clothing?: A Little 6 Click Score: 20   End of Session Nurse Communication: Mobility status  Activity Tolerance: Patient tolerated treatment well Patient left: in chair;with call bell/phone within reach;with  family/visitor present  OT Visit Diagnosis: Unsteadiness on feet (R26.81)                Time: 7741-2878 OT Time Calculation (min): 23 min Charges:  OT General Charges $OT Visit: 1 Visit OT Evaluation $OT Eval Low Complexity: 1 Low OT Treatments $Self Care/Home Management : 8-22 mins  Darleen Crocker, MS, OTR/L , CBIS ascom [phone]  04/09/21, 11:17 AM

## 2021-04-09 NOTE — Progress Notes (Signed)
Nichole Park   DOB:June 11, 1961   PQ#:[id number]    Subjective: Patient states that she feels the best the many days.  She has been walking the bathroom without nasal cannula oxygen.  She however gets short of breath with exertion.  Otherwise no cough.  No headaches.  No nausea no vomiting.   Objective:  Vitals:   04/09/21 2003 04/09/21 2013  BP: 98/74   Pulse: 79   Resp: 20   Temp: 98 F (36.7 C)   SpO2: 94% 94%     Intake/Output Summary (Last 24 hours) at 04/09/2021 2354 Last data filed at 04/09/2021 1500 Gross per 24 hour  Intake 1100 ml  Output --  Net 1100 ml    Physical Exam Vitals and nursing note reviewed.  Constitutional:      Comments: Patient resting in the bed; not on oxygen; alone.  HENT:     Head: Normocephalic and atraumatic.     Mouth/Throat:     Mouth: Mucous membranes are moist.     Pharynx: No oropharyngeal exudate.  Eyes:     Pupils: Pupils are equal, round, and reactive to light.  Cardiovascular:     Rate and Rhythm: Normal rate. Rhythm irregular.  Pulmonary:     Effort: No respiratory distress.     Breath sounds: No wheezing.     Comments: Decreased breath sounds bilaterally at bases.   Abdominal:     General: Bowel sounds are normal. There is no distension.     Palpations: Abdomen is soft. There is no mass.     Tenderness: There is no abdominal tenderness. There is no guarding or rebound.  Musculoskeletal:        General: No tenderness. Normal range of motion.     Cervical back: Normal range of motion and neck supple.  Skin:    General: Skin is warm.  Neurological:     Mental Status: She is alert and oriented to person, place, and time.  Psychiatric:        Mood and Affect: Affect normal.        Judgment: Judgment normal.     Labs:  Lab Results  Component Value Date   WBC 20.1 (H) 04/09/2021   HGB 14.3 04/09/2021   HCT 43.0 04/09/2021   MCV 86.0 04/09/2021   PLT 504 (H) 04/09/2021   NEUTROABS 12.6 (H) 04/02/2021    Lab Results   Component Value Date   NA 137 04/09/2021   K 4.7 04/09/2021   CL 99 04/09/2021   CO2 28 04/09/2021    Studies:  No results found.  Malignant pericardial effusion [facility]) #60 year old female patient with history of smoking currently admitted to hospital for pericardial tamponade/malignant pericardial effusion/acute respiratory failure  # Malignant pericardial tamponade-s/p pericardiocentesis;-secondary to lung adenocarcinoma-repeat 2D echo negative for any pericardial effusion.Marland Kitchen  #Acute respiratory failure-multifactorial-malignancy/lymphangitic spread/COPD stable-significant provement s/p/bronchodilators/diuretics.  Stable/improved  #A. Fib-amiodarone/Cardizem as per cardiology.  Stable  #Leukocytosis White count 20 ~likely secondary to steroids.  #I had a long discussion again with the patient [significant other on the phone]-regarding chemoimmunotherapy-carbo Alimta Keytruda every 3 weeks x 4 cycles.  Followed by Alimta Keytruda maintenance.  Check PD-L1/NGS if possible.  Discussed the potential side effects including but not limited to-increasing fatigue, nausea vomiting, diarrhea, hair loss, sores in the mouth, increase risk of infection and also neuropathy.  Again understands treatments are palliative not curative.  After lengthy discussion patient-states that she wants to go to [facility]; and get all her treatments at  [facility].  Also considering complementary therapy/holistic medicine which I think is reasonable but I told her that it would be inadequate to treat her aggressive disease.   # 40 minutes face-to-face with the patient discussing the above plan of care; more than 50% of time spent on prognosis/ natural history; counseling and coordination.  Addendum: Later I got a message from [place]; cardiology the patient has changed her mind and she was to follow-up locally for treatment options.  Informed Dr. Jimmye Park the patient could be discharged from oncology standpoint; and we will  follow-up later in the week/plan outpatient chemotherapy.  Nichole Sickle, MD 04/09/2021  11:54 PM

## 2021-04-09 NOTE — Progress Notes (Addendum)
PROGRESS NOTE    Nichole Park  IHK:[id number] DOB: 03-22-1961 DOA: 03/27/2021 PCP: Pcp, No  Assessment & Plan:   Principal Problem:   Cardiac tamponade Active Problems:   Cardiac/pericardial tamponade   Malignant pericardial effusion (HCC)   Tobacco use   PAF (paroxysmal atrial fibrillation) (HCC)   COPD (chronic obstructive pulmonary disease) (HCC)   Lung mass   Palliative care encounter   Pericardial effusion: w/ cardiac tamponade. S/p pericardiocentesis with 400 ml drainage. Drain has since been removed. Cytology confirms malignancy, special stains are pending. Continue on lasix. CTA neg. TTE shows preserved EF, diastolic dysfunction, no reaccumulation of fluid but pericardial fat vs malignancy, poss still tamponade.   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 Final pathology is pending still. Not candidate currently for EBUS with biopsy if/when patient stable, likely as outpt as per Dr. Patsey Berthold. Chemoimmunotherapy recommended by onco but pt still has not decided on this yet and pt is considering a "holistic approach" and/or a second opinion. Onco, palliative care are following and recs apprec   Acute hypoxic respiratory failure: secondary to lung cancer w/ lymphangitic spread.  Continue on supplemental oxygen and continue on steroid taper    Likely PAF: w/ intermittent RVR. High risk for PE but anticoagulation is contraindicated. Continue on cardizem, amio as per cardio   Thrombocytosis: labile, etiology unclear    Hyperkalemia: resolved    Hyponatremia: resolved   Leukocytosis: likely secondary to steroid use. Continue on steroid wean   Acute cystitis: UA was suggestive of infection but urine cx neg. Abxs were d/c    Graves Disease: w/ hx diffuse goiter and elevated TSIs (in care everywhere). Hx rash w/ methimazole, stopped that several years ago and hasn't had f/u. Continue on propylthiouricil & will monitor LFTs. TSH is WNL  UTI: urine cx growing e. coli,  enterococcus, sens pending. Started on unasyn   Pre-DM: HbA1c 6.2. Continue to monitor on BS w/ BMP       DVT prophylaxis: lovenox  Code Status: full  Family Communication: discussed pt's care w/ pt's family at bedside and answered their questions Disposition Plan: unclear   Level of care: Progressive Cardiac  Status is: Inpatient  Remains inpatient appropriate because:Ongoing diagnostic testing needed not appropriate for outpatient work up, Unsafe d/c plan, IV treatments appropriate due to intensity of illness or inability to take PO, and Inpatient level of care appropriate due to severity of illness  Dispo: The patient is from: Home              Anticipated d/c is to:  unclear              Patient currently is not medically stable to d/c.   Difficult to place patient Yes   Consultants:  Palliative care Onco Cardio Pulmon   Procedures:   Antimicrobials:    Subjective: Pt c/o malaise   Objective: Vitals:   04/08/21 2013 04/09/21 0500 04/09/21 0615 04/09/21 0723  BP:   118/65 129/82  Pulse:   69 84  Resp:   20 18  Temp:   98.2 F (36.8 C) (!) 97.5 F (36.4 C)  TempSrc:   Oral   SpO2: 94%  96% 94%  Weight:  57.2 kg    Height:        Intake/Output Summary (Last 24 hours) at 04/09/2021 0735 Last data filed at 04/08/2021 1524 Gross per 24 hour  Intake 120 ml  Output 1300 ml  Net -1180 ml   Filed  Weights   04/05/21 0345 04/06/21 0500 04/09/21 0500  Weight: 62 kg 62 kg 57.2 kg    Examination:  General exam: Appears comfortable.  Respiratory system: decreased breath sounds b/l  Cardiovascular system: S1/S2+. No rubs or clicks  Gastrointestinal system: Abd is soft, NT,ND & hypoactive bowel sounds  Central nervous system: Alert and oriented. Moves all extremities   Psychiatry: Judgement and insight appear normal.    Data Reviewed: I have personally reviewed following labs and imaging studies  CBC: Recent Labs  Lab 04/05/21 0549 04/06/21 0537  04/07/21 0524 04/08/21 0512 04/09/21 0417  WBC 28.7* 23.6* 27.4* 21.7* 20.1*  HGB 13.7 14.2 14.4 13.7 14.3  HCT 40.8 42.7 43.4 41.8 43.0  MCV 89.3 88.6 87.9 86.5 86.0  PLT 524* 565* 600* 499* 465*   Basic Metabolic Panel: Recent Labs  Lab 04/03/21 0327 04/05/21 0549 04/06/21 0537 04/06/21 1628 04/07/21 0524 04/08/21 0512 04/09/21 0417  NA 131* 134* 137  --  134* 135 137  K 4.2 4.4 5.4* 4.8 4.3 5.1 4.7  CL 92* 95* 97*  --  95* 101 99  CO2 33* 29 31  --  30 29 28   GLUCOSE 173* 196* 175*  --  123* 130* 122*  BUN 8 20 16   --  23* 22* 20  CREATININE 0.47 0.64 0.56  --  0.63 0.70 0.66  CALCIUM 8.8* 9.0 9.4  --  9.5 8.9 9.2  MG 2.4 2.2 2.3  --  2.9* 2.6* 2.7*  PHOS 3.6  --   --   --   --   --   --    GFR: Estimated Creatinine Clearance: 60 mL/min (by C-G formula based on SCr of 0.66 mg/dL). Liver Function Tests: Recent Labs  Lab 04/03/21 0327 04/08/21 0512 04/09/21 0417  AST 29 24 24   ALT 26 45* 40  ALKPHOS 71 81 79  BILITOT 0.8 0.5 0.6  PROT 6.4* 5.8* 6.2*  ALBUMIN 2.2* 2.4* 2.7*   No results for input(s): LIPASE, AMYLASE in the last 168 hours. No results for input(s): AMMONIA in the last 168 hours. Coagulation Profile: No results for input(s): INR, PROTIME in the last 168 hours. Cardiac Enzymes: No results for input(s): CKTOTAL, CKMB, CKMBINDEX, TROPONINI in the last 168 hours. BNP (last 3 results) No results for input(s): PROBNP in the last 8760 hours. HbA1C: No results for input(s): HGBA1C in the last 72 hours. CBG: No results for input(s): GLUCAP in the last 168 hours. Lipid Profile: No results for input(s): CHOL, HDL, LDLCALC, TRIG, CHOLHDL, LDLDIRECT in the last 72 hours. Thyroid Function Tests: Recent Labs    04/07/21 0524  TSH 4.241   Anemia Panel: No results for input(s): VITAMINB12, FOLATE, FERRITIN, TIBC, IRON, RETICCTPCT in the last 72 hours. Sepsis Labs: No results for input(s): PROCALCITON, LATICACIDVEN in the last 168 hours.   No  results found for this or any previous visit (from the past 240 hour(s)).        Radiology Studies: No results found.      Scheduled Meds:  amiodarone  200 mg Oral BID   arformoterol  15 mcg Nebulization BID   budesonide (PULMICORT) nebulizer solution  0.25 mg Nebulization BID   diltiazem  240 mg Oral Daily   docusate sodium  200 mg Oral BID   enoxaparin (LOVENOX) injection  40 mg Subcutaneous Q24H   feeding supplement  1 Container Oral TID BM   furosemide  40 mg Oral Daily   mouth rinse  15 mL  Mouth Rinse BID   melatonin  2.5 mg Oral QHS   multivitamin with minerals  1 tablet Oral Daily   nicotine  21 mg Transdermal Daily   nystatin  5 mL Oral QID   polyethylene glycol  17 g Oral Daily   predniSONE  10 mg Oral Q breakfast   propylthiouracil  100 mg Oral Q8H   revefenacin  175 mcg Nebulization Daily   sodium chloride  1 g Oral TID WC   Continuous Infusions:  sodium chloride Stopped (04/02/21 0226)     LOS: 13 days    Time spent: 30 mins    Wyvonnia Dusky, MD [facility] Pager [phone]  If 7PM-7AM, please contact night-coverage  04/09/2021, 7:35 AM

## 2021-04-09 NOTE — Progress Notes (Signed)
IV and tele box removed from pt. Pt to d/c home per MD order. Discharge instructions given to pt. Per pt does not have insurance and can't get medications tonight.  Patient states daughter is her Healthcare power of attorney and would like for daughter to be present before d/c. Primary RN Manuela Schwartz aware and to contact MD regarding meds.

## 2021-04-09 NOTE — Progress Notes (Signed)
Progress Note  Patient Name: Nichole Park Date of Encounter: 04/09/2021  [facility] Cardiologist: [facility]  Subjective   She feels well with no chest pain, shortness of breath or palpitations.  She is maintaining in sinus rhythm right now but she did have short episodes of A. fib with RVR this morning.  Inpatient Medications    Scheduled Meds:  amiodarone  200 mg Oral BID   arformoterol  15 mcg Nebulization BID   budesonide (PULMICORT) nebulizer solution  0.25 mg Nebulization BID   diltiazem  240 mg Oral Daily   docusate sodium  200 mg Oral BID   enoxaparin (LOVENOX) injection  40 mg Subcutaneous Q24H   feeding supplement  1 Container Oral TID BM   furosemide  40 mg Oral Daily   mouth rinse  15 mL Mouth Rinse BID   melatonin  2.5 mg Oral QHS   multivitamin with minerals  1 tablet Oral Daily   nicotine  21 mg Transdermal Daily   nystatin  5 mL Oral QID   polyethylene glycol  17 g Oral Daily   predniSONE  10 mg Oral Q breakfast   propylthiouracil  100 mg Oral Q8H   revefenacin  175 mcg Nebulization Daily   sodium chloride  1 g Oral TID WC   Continuous Infusions:  sodium chloride Stopped (04/02/21 0226)   ampicillin-sulbactam (UNASYN) IV 1.5 g (04/09/21 1406)   PRN Meds: acetaminophen, bisacodyl, chlorpheniramine-HYDROcodone, guaiFENesin, guaiFENesin-dextromethorphan, levalbuterol, LORazepam, morphine injection   Vital Signs    Vitals:   04/09/21 0500 04/09/21 0615 04/09/21 0723 04/09/21 0734  BP:  118/65 129/82   Pulse:  69 84   Resp:  20 18   Temp:  98.2 F (36.8 C) (!) 97.5 F (36.4 C)   TempSrc:  Oral    SpO2:  96% 94% 94%  Weight: 57.2 kg     Height:        Intake/Output Summary (Last 24 hours) at 04/09/2021 1430 Last data filed at 04/08/2021 1524 Gross per 24 hour  Intake --  Output 400 ml  Net -400 ml    Last 3 Weights 04/09/2021 04/06/2021 04/05/2021  Weight (lbs) 126 lb 1.6 oz 136 lb 11 oz 136 lb 11 oz  Weight (kg) 57.199 kg 62 kg 62 kg       Telemetry    Normal sinus rhythm rate 80 bpm.  Short episodes of A. fib with RVR in a.m.  Personally Reviewed  ECG    - Personally Reviewed  Physical Exam   Constitutional:  oriented to person, place, and time. No distress.  HENT:  Head: Grossly normal Eyes:  no discharge. No scleral icterus.  Neck: No JVD, no carotid bruits  Cardiovascular: Regular rate and rhythm, no murmurs appreciated Pulmonary/Chest: Clear to auscultation bilaterally, no wheezes or rails Abdominal: Soft.  no distension.  no tenderness.  Musculoskeletal: Normal range of motion Neurological:  normal muscle tone. Coordination normal. No atrophy Skin: Skin warm and dry Psychiatric: normal affect, pleasant   Labs    High Sensitivity Troponin:  No results for input(s): TROPONINIHS in the last 720 hours.    Chemistry Recent Labs  Lab 04/03/21 0327 04/05/21 0549 04/07/21 0524 04/08/21 0512 04/09/21 0417  NA 131*   < > 134* 135 137  K 4.2   < > 4.3 5.1 4.7  CL 92*   < > 95* 101 99  CO2 33*   < > 30 29 28   GLUCOSE 173*   < >  123* 130* 122*  BUN 8   < > 23* 22* 20  CREATININE 0.47   < > 0.63 0.70 0.66  CALCIUM 8.8*   < > 9.5 8.9 9.2  PROT 6.4*  --   --  5.8* 6.2*  ALBUMIN 2.2*  --   --  2.4* 2.7*  AST 29  --   --  24 24  ALT 26  --   --  45* 40  ALKPHOS 71  --   --  81 79  BILITOT 0.8  --   --  0.5 0.6  GFRNONAA >60   < > >60 >60 >60  ANIONGAP 6   < > 9 5 10    < > = values in this interval not displayed.      Hematology Recent Labs  Lab 04/07/21 0524 04/08/21 0512 04/09/21 0417  WBC 27.4* 21.7* 20.1*  RBC 4.94 4.83 5.00  HGB 14.4 13.7 14.3  HCT 43.4 41.8 43.0  MCV 87.9 86.5 86.0  MCH 29.1 28.4 28.6  MCHC 33.2 32.8 33.3  RDW 13.0 13.2 13.2  PLT 600* 499* 504*     BNP Recent Labs  Lab 04/03/21 0327  BNP 382.5*      DDimer No results for input(s): DDIMER in the last 168 hours.   Radiology    No results found.  Cardiac Studies   Echo April 02, 2021  1. Left  ventricular ejection fraction, by estimation, is >55%. The left  ventricle has normal function.   2. Right ventricular systolic function is normal. Moderately increased  right ventricular wall thickness.   3. Echogenic material in the pericardial space is noted, which could  represent epicardial fat. However, in the setting of malignant pericardial  effusion, tumor cannot be excluded.   Patient Profile     60 y.o. female with history of Graves' disease, tobacco use of 40+ years who presented with SOB and was found to have a large pericardial effusion requiring emergent pericardiocentesis, as well as a right upper lobe mass suspicious for malignancy.  Assessment & Plan   1. Pericardial effusion and cardiac tamponade: -Status post successful emergent pericardiocentesis on 03/27/2021 with removal of 500 mL of serosanguinous fluid with cytology consistent with malignancy likely adenocarcinoma of the lungs.    Repeat echocardiograms no significant reaccumulation of effusion Can consider repeat limited echocardiogram in few weeks.   2.  Paroxysmal atrial fibrillation  Very difficult controlling her paroxysmal atrial fibrillation this hospitalization. The patient had short runs of A. fib with RVR this morning but she is back in sinus rhythm.  Recommend continuing amiodarone 200 mg twice daily and diltiazem at the current dose. -CHADS2VASc at least 1 .  She is not a good candidate for anticoagulation anyway given chance of hemorrhagic pericardial effusion.    3.  Newly diagnosed lung cancer/emphysema/small bilateral pleural effusions/tobacco use: The patient was seen by Dr. Rogue Bussing with plans for chemotherapy in the near future.  4.  Acute diastolic CHF secondary to atrial fibrillation She seems to be euvolemic on current dose of oral furosemide.  The patient can be discharged home from a cardiac standpoint but she had questions about the timing of chemotherapy initiation.   For  questions or updates, please contact [place] Please consult [email or web address] for contact info under        Signed, Kathlyn Sacramento, MD  04/09/2021, 2:30 PM

## 2021-04-10 ENCOUNTER — Encounter: Payer: Self-pay | Admitting: Internal Medicine

## 2021-04-10 ENCOUNTER — Other Ambulatory Visit: Payer: Self-pay | Admitting: *Deleted

## 2021-04-10 ENCOUNTER — Other Ambulatory Visit: Payer: Self-pay

## 2021-04-10 ENCOUNTER — Other Ambulatory Visit: Payer: Self-pay | Admitting: Internal Medicine

## 2021-04-10 ENCOUNTER — Telehealth: Payer: Self-pay | Admitting: Internal Medicine

## 2021-04-10 DIAGNOSIS — Z658 Other specified problems related to psychosocial circumstances: Secondary | ICD-10-CM

## 2021-04-10 DIAGNOSIS — C3411 Malignant neoplasm of upper lobe, right bronchus or lung: Secondary | ICD-10-CM | POA: Insufficient documentation

## 2021-04-10 DIAGNOSIS — Z599 Problem related to housing and economic circumstances, unspecified: Secondary | ICD-10-CM

## 2021-04-10 DIAGNOSIS — Z5989 Other problems related to housing and economic circumstances: Secondary | ICD-10-CM

## 2021-04-10 DIAGNOSIS — Z91419 Personal history of unspecified adult abuse: Secondary | ICD-10-CM

## 2021-04-10 LAB — URINE CULTURE: Culture: 30000 — AB

## 2021-04-10 LAB — COMPREHENSIVE METABOLIC PANEL
ALT: 34 U/L (ref 0–44)
AST: 22 U/L (ref 15–41)
Albumin: 2.8 g/dL — ABNORMAL LOW (ref 3.5–5.0)
Alkaline Phosphatase: 73 U/L (ref 38–126)
Anion gap: 9 (ref 5–15)
BUN: 15 mg/dL (ref 6–20)
CO2: 26 mmol/L (ref 22–32)
Calcium: 8.6 mg/dL — ABNORMAL LOW (ref 8.9–10.3)
Chloride: 95 mmol/L — ABNORMAL LOW (ref 98–111)
Creatinine, Ser: 0.56 mg/dL (ref 0.44–1.00)
GFR, Estimated: >60 mL/min (ref >60)
Glucose, Bld: 130 mg/dL — ABNORMAL HIGH (ref 70–99)
Potassium: 3.9 mmol/L (ref 3.5–5.1)
Sodium: 130 mmol/L — ABNORMAL LOW (ref 135–145)
Total Bilirubin: 0.7 mg/dL (ref 0.3–1.2)
Total Protein: 6.4 g/dL — ABNORMAL LOW (ref 6.5–8.1)

## 2021-04-10 LAB — CBC
HCT: 44.8 % (ref 36.0–46.0)
Hemoglobin: 14.8 g/dL (ref 12.0–15.0)
MCH: 28.4 pg (ref 26.0–34.0)
MCHC: 33 g/dL (ref 30.0–36.0)
MCV: 85.8 fL (ref 80.0–100.0)
Platelets: 486 10*3/uL — ABNORMAL HIGH (ref 150–400)
RBC: 5.22 MIL/uL — ABNORMAL HIGH (ref 3.87–5.11)
RDW: 13.2 % (ref 11.5–15.5)
WBC: 21 10*3/uL — ABNORMAL HIGH (ref 4.0–10.5)
nRBC: 0 % (ref 0.0–0.2)

## 2021-04-10 MED ORDER — ALBUTEROL SULFATE HFA 108 (90 BASE) MCG/ACT IN AERS
2.0000 | INHALATION_SPRAY | Freq: Four times a day (QID) | RESPIRATORY_TRACT | 0 refills | Status: DC | PRN
  Filled 2021-04-10: qty 8.5, 25d supply, fill #0

## 2021-04-10 MED ORDER — PROPYLTHIOURACIL 50 MG PO TABS
100.0000 mg | ORAL_TABLET | Freq: Three times a day (TID) | ORAL | 0 refills | Status: DC
Start: 2021-04-10 — End: 2021-04-25
  Filled 2021-04-10 – 2021-04-17 (×2): qty 180, 30d supply, fill #0

## 2021-04-10 MED ORDER — AMIODARONE HCL 200 MG PO TABS
200.0000 mg | ORAL_TABLET | Freq: Two times a day (BID) | ORAL | 0 refills | Status: DC
  Filled 2021-04-10: qty 30, 15d supply, fill #0

## 2021-04-10 MED ORDER — ALBUTEROL SULFATE HFA 108 (90 BASE) MCG/ACT IN AERS: 2.0000 | INHALATION_SPRAY | Freq: Four times a day (QID) | RESPIRATORY_TRACT | 0 refills | Status: DC | PRN

## 2021-04-10 MED ORDER — NITROFURANTOIN MONOHYD MACRO 100 MG PO CAPS
100.0000 mg | ORAL_CAPSULE | Freq: Two times a day (BID) | ORAL | 0 refills | Status: AC
  Filled 2021-04-10: qty 10, 5d supply, fill #0

## 2021-04-10 MED ORDER — DILTIAZEM HCL ER 240 MG PO CP24
240.0000 mg | ORAL_CAPSULE | Freq: Every day | ORAL | 0 refills | Status: DC
  Filled 2021-04-10 (×2): qty 20, 20d supply, fill #0

## 2021-04-10 MED ORDER — FUROSEMIDE 40 MG PO TABS
40.0000 mg | ORAL_TABLET | Freq: Every day | ORAL | 0 refills | Status: DC
  Filled 2021-04-10: qty 30, 30d supply, fill #0

## 2021-04-10 MED ORDER — NITROFURANTOIN MONOHYD MACRO 100 MG PO CAPS: 100.0000 mg | ORAL_CAPSULE | Freq: Two times a day (BID) | ORAL | 0 refills | Status: DC

## 2021-04-10 NOTE — Progress Notes (Signed)
START OFF PATHWAY REGIMEN - Non-Small Cell Lung   [id number]:Pembrolizumab 200 mg  IV D1 + Pemetrexed 500 mg/m2 IV D1 + Carboplatin AUC=5 IV D1 q21 Days:   A cycle is every 21 days:     Pembrolizumab      Pemetrexed      Carboplatin   **Always confirm dose/schedule in your pharmacy ordering system**  Patient Characteristics: Stage IV Metastatic, Nonsquamous, Did Not Order Molecular Analysis/Quantity Not Sufficient for Molecular Analysis Therapeutic Status: Stage IV Metastatic Histology: Nonsquamous Cell Broad Molecular Profiling Status: Quantity Not Sufficient for Molecular Analysis  Intent of Therapy: Non-Curative / Palliative Intent, Discussed with Patient

## 2021-04-10 NOTE — Progress Notes (Signed)
Pt is A&O, VS stable, no monitor, no IV in place. Independent in the room. Plan for D/C home today. No complaints of pain or discomfort. Has multiple questions for the doctor today

## 2021-04-10 NOTE — Progress Notes (Signed)
Discharge instructions explained to pt and family/verbalized understanding. Will transport off unit via wheelchair.

## 2021-04-10 NOTE — Telephone Encounter (Signed)
Dr. Rogue Bussing would like to send [facility] for patient's pathology. Pt is self pay. [employer] does have patient assistance, but Dr. B would like to talk to patient first before submitted the molecular testing.

## 2021-04-10 NOTE — TOC Progression Note (Signed)
Transition of Care [facility]) - Progression Note    Patient Details  Name: Nichole Park MRN: [id number] Date of Birth: 25-Jan-1961  [facility]) CM/SW Contact  Eileen Stanford, LCSW Phone Number: 04/10/2021, 11:37 AM  Clinical Narrative:  Pt seeking PCP with no insurance. CSW provided application for [facility] and referral made. Will notify MD to send meds to med management per request of pt.    Expected Discharge Plan: [place] Barriers to Discharge: Continued Medical Work up  Expected Discharge Plan and Services Expected Discharge Plan: [place] arrangements for the past 2 months: Single Family Home Expected Discharge Date: 04/09/21                                     Social Determinants of Health (SDOH) Interventions    Readmission Risk Interventions No flowsheet data found.

## 2021-04-10 NOTE — Telephone Encounter (Signed)
M-this patient needs follow-up re: lung cancer Hospital follow-up.    Please schedule- on 8/04 at 8:30-MD; labs-CBC CMP CEA.   Also please schedule-chemotherapy education ASAP-carbo Alimta Keytruda.   Thanks GB

## 2021-04-10 NOTE — Addendum Note (Signed)
Addended by: Gloris Ham on: 04/10/2021 09:01 AM   Modules accepted: Orders

## 2021-04-11 ENCOUNTER — Other Ambulatory Visit: Payer: Self-pay

## 2021-04-12 ENCOUNTER — Telehealth: Payer: Self-pay | Admitting: Internal Medicine

## 2021-04-12 ENCOUNTER — Encounter: Payer: Self-pay | Admitting: *Deleted

## 2021-04-12 ENCOUNTER — Inpatient Hospital Stay: Payer: Medicaid Other

## 2021-04-12 ENCOUNTER — Telehealth: Payer: Self-pay | Admitting: *Deleted

## 2021-04-12 ENCOUNTER — Other Ambulatory Visit: Payer: Self-pay

## 2021-04-12 ENCOUNTER — Encounter: Payer: Self-pay | Admitting: Internal Medicine

## 2021-04-12 ENCOUNTER — Inpatient Hospital Stay: Payer: Medicaid Other | Attending: Internal Medicine | Admitting: Internal Medicine

## 2021-04-12 VITALS — BP 94/65 | HR 81 | Temp 97.6°F | Ht 61.0 in | Wt 128.8 lb

## 2021-04-12 DIAGNOSIS — C3411 Malignant neoplasm of upper lobe, right bronchus or lung: Secondary | ICD-10-CM

## 2021-04-12 DIAGNOSIS — R21 Rash and other nonspecific skin eruption: Secondary | ICD-10-CM | POA: Diagnosis not present

## 2021-04-12 DIAGNOSIS — B370 Candidal stomatitis: Secondary | ICD-10-CM

## 2021-04-12 DIAGNOSIS — B379 Candidiasis, unspecified: Secondary | ICD-10-CM | POA: Insufficient documentation

## 2021-04-12 DIAGNOSIS — Z5112 Encounter for antineoplastic immunotherapy: Secondary | ICD-10-CM | POA: Insufficient documentation

## 2021-04-12 DIAGNOSIS — Z5111 Encounter for antineoplastic chemotherapy: Secondary | ICD-10-CM | POA: Insufficient documentation

## 2021-04-12 DIAGNOSIS — G9389 Other specified disorders of brain: Secondary | ICD-10-CM | POA: Insufficient documentation

## 2021-04-12 DIAGNOSIS — B3781 Candidal esophagitis: Secondary | ICD-10-CM

## 2021-04-12 DIAGNOSIS — I313 Pericardial effusion (noninflammatory): Secondary | ICD-10-CM | POA: Diagnosis not present

## 2021-04-12 DIAGNOSIS — Z79899 Other long term (current) drug therapy: Secondary | ICD-10-CM | POA: Diagnosis not present

## 2021-04-12 DIAGNOSIS — I480 Paroxysmal atrial fibrillation: Secondary | ICD-10-CM | POA: Diagnosis not present

## 2021-04-12 LAB — CBC WITH DIFFERENTIAL/PLATELET
Abs Immature Granulocytes: 0.49 10*3/uL — ABNORMAL HIGH (ref 0.00–0.07)
Basophils Absolute: 0.1 10*3/uL (ref 0.0–0.1)
Basophils Relative: 0 %
Eosinophils Absolute: 0.2 10*3/uL (ref 0.0–0.5)
Eosinophils Relative: 1 %
HCT: 43.4 % (ref 36.0–46.0)
Hemoglobin: 14.4 g/dL (ref 12.0–15.0)
Immature Granulocytes: 3 %
Lymphocytes Relative: 8 %
Lymphs Abs: 1.4 10*3/uL (ref 0.7–4.0)
MCH: 28.9 pg (ref 26.0–34.0)
MCHC: 33.2 g/dL (ref 30.0–36.0)
MCV: 87 fL (ref 80.0–100.0)
Monocytes Absolute: 1.5 10*3/uL — ABNORMAL HIGH (ref 0.1–1.0)
Monocytes Relative: 8 %
Neutro Abs: 14.6 10*3/uL — ABNORMAL HIGH (ref 1.7–7.7)
Neutrophils Relative %: 80 %
Platelets: 361 10*3/uL (ref 150–400)
RBC: 4.99 MIL/uL (ref 3.87–5.11)
RDW: 13.2 % (ref 11.5–15.5)
WBC: 18.2 10*3/uL — ABNORMAL HIGH (ref 4.0–10.5)
nRBC: 0 % (ref 0.0–0.2)

## 2021-04-12 LAB — COMPREHENSIVE METABOLIC PANEL
ALT: 34 U/L (ref 0–44)
AST: 30 U/L (ref 15–41)
Albumin: 2.9 g/dL — ABNORMAL LOW (ref 3.5–5.0)
Alkaline Phosphatase: 81 U/L (ref 38–126)
Anion gap: 10 (ref 5–15)
BUN: 11 mg/dL (ref 6–20)
CO2: 24 mmol/L (ref 22–32)
Calcium: 8.8 mg/dL — ABNORMAL LOW (ref 8.9–10.3)
Chloride: 93 mmol/L — ABNORMAL LOW (ref 98–111)
Creatinine, Ser: 0.56 mg/dL (ref 0.44–1.00)
GFR, Estimated: >60 mL/min (ref >60)
Glucose, Bld: 111 mg/dL — ABNORMAL HIGH (ref 70–99)
Potassium: 4 mmol/L (ref 3.5–5.1)
Sodium: 127 mmol/L — ABNORMAL LOW (ref 135–145)
Total Bilirubin: 0.5 mg/dL (ref 0.3–1.2)
Total Protein: 6.9 g/dL (ref 6.5–8.1)

## 2021-04-12 MED ORDER — NYSTATIN 100000 UNIT/ML MT SUSP: 5.0000 mL | Freq: Four times a day (QID) | OROMUCOSAL | 0 refills | Status: DC

## 2021-04-12 MED ORDER — NYSTATIN 100000 UNIT/ML MT SUSP
5.0000 mL | Freq: Four times a day (QID) | OROMUCOSAL | 0 refills | Status: DC
  Filled 2021-04-12: qty 60, 3d supply, fill #0

## 2021-04-12 NOTE — Telephone Encounter (Signed)
Daughter called requesting that when note is done that physician include on it that patient is Stage 4 for her disability claim

## 2021-04-12 NOTE — Telephone Encounter (Signed)
Nichole Park- will you call the pt's daughter and see what she needs.

## 2021-04-12 NOTE — Progress Notes (Signed)
Met with patient during follow up visit with Dr. Rogue Bussing to discuss pathology results and treatment options. All questions answered during visit. Pt given resources regarding diagnosis and supportive services available. Pt does not have insurance at this time and has voiced concerns regarding finances. Referral previously placed for social work and pt met with pharmacy staff to discuss financial assistance options. Pt stated that she needs time to think about her options. Reassurance provided and encouraged pt to attend chemo education class to help with her decision making. Informed pt that will follow up with her next week to discuss next steps. Contact info given and instructed to call with any questions or needs. Pt verbalized understanding.

## 2021-04-12 NOTE — Progress Notes (Signed)
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  female patient with history of smoking was recently admitted to hospital for acute respiratory failure.  Patient was on BiPAP/ASV.  Further work-up showed right upper lobe lung mass; lymphangitic spread; pericardial effusion/tamponade.  Patient underwent pericardiocentesis-positive for malignant cells immunohistochemistry suggestive of adenocarcinoma.  Patient was diuresed treated with steroids-subsequently discharged home off oxygen.  Patient is here to discuss further treatment options.  Patient breathing is improved.  She is walking independently.  Denies any headaches.  Denies any nausea vomiting.  Appetite improving.  She does get short of breath on exertion.  Review of Systems  Constitutional:  Positive for malaise/fatigue and weight loss. Negative for chills, diaphoresis and fever.  HENT:  Negative for nosebleeds and sore throat.   Eyes:  Negative for double vision.  Respiratory:  Positive for shortness of breath. Negative for cough, hemoptysis, sputum production and wheezing.   Cardiovascular:  Negative  for chest pain, palpitations, orthopnea and leg swelling.  Gastrointestinal:  Negative for abdominal pain, blood in stool, constipation, diarrhea, heartburn, melena, nausea and vomiting.  Genitourinary:  Negative for dysuria, frequency and urgency.  Musculoskeletal:  Negative for back pain and joint pain.  Skin: Negative.  Negative for itching and rash.  Neurological:  Negative for dizziness, tingling, focal weakness, weakness and headaches.  Endo/Heme/Allergies:  Does not bruise/bleed easily.  Psychiatric/Behavioral:  Negative for depression. The patient is not nervous/anxious and does not have insomnia.     MEDICAL HISTORY:  Past Medical History:  Diagnosis Date   Graves disease     SURGICAL HISTORY: Past Surgical History:  Procedure Laterality Date   PERICARDIOCENTESIS N/A 03/27/2021   Procedure: PERICARDIOCENTESIS;  Surgeon: Nelva Bush, MD;  Location: [place] [facility] [place] [facility];  Service: Cardiovascular;  Laterality: N/A;   TUBAL LIGATION      SOCIAL HISTORY: Social History   Socioeconomic History   Marital status: Widowed    Spouse name: Not on file   Number of children: Not on file   Years of education: Not on file   Highest education level: Not on file  Occupational History   Not on file  Tobacco Use   Smoking status: Every Day    Packs/day: 1.00    Years: 40.00    Pack years: 40.00    Types: Cigarettes   Smokeless tobacco: Never  Substance and Sexual Activity   Alcohol use: No   Drug use: Yes    Types: Marijuana   Sexual activity: Not on file  Other Topics Concern   Not on file  Social History Narrative  Not on file   Social Determinants of Health   Financial Resource Strain: Not on file  Food Insecurity: Not on file  Transportation Needs: Not on file  Physical Activity: Not on file  Stress: Not on file  Social Connections: Not on file  Intimate Partner Violence: Not on file    FAMILY HISTORY: Family History  Problem Relation Age of Onset    Peripheral Artery Disease Mother     ALLERGIES:  is allergic to diazepam, metoprolol, and tapazole [methimazole].  MEDICATIONS:  Current Outpatient Medications  Medication Sig Dispense Refill   albuterol (VENTOLIN HFA) 108 (90 Base) MCG/ACT inhaler Inhale 2 puffs into the lungs every 6 (six) hours as needed for wheezing or shortness of breath. 8.5 g 0   amiodarone (PACERONE) 200 MG tablet Take 1 tablet (200 mg total) by mouth 2 (two) times daily. 60 tablet 0   diltiazem (DILACOR XR) 240 MG 24 hr capsule Take 1 capsule (240 mg total) by mouth once daily. 30 capsule 0   furosemide (LASIX) 40 MG tablet Take 1 tablet (40 mg total) by mouth once daily. 30 tablet 0   Multiple Vitamin (MULTI-VITAMINS) TABS Take 1 tablet by mouth daily.     nitrofurantoin, macrocrystal-monohydrate, (MACROBID) 100 MG capsule Take 1 capsule (100 mg total) by mouth 2 (two) times daily for 5 days. 10 capsule 0   propylthiouracil (PTU) 50 MG tablet Take 2 tablets (100 mg total) by mouth every 8 (eight) hours. 180 tablet 0   nystatin (MYCOSTATIN) 100000 UNIT/ML suspension Take 5 mLs (500,000 Units total) by mouth 4 (four) times daily. 60 mL 0   nystatin (MYCOSTATIN) 100000 UNIT/ML suspension Take 5 mLs (500,000 Units total) by mouth 4 (four) times daily. 60 mL 0   No current facility-administered medications for this visit.      Marland Kitchen  PHYSICAL EXAMINATION: ECOG PERFORMANCE STATUS: 1 - Symptomatic but completely ambulatory  Vitals:   04/12/21 0850  BP: 94/65  Pulse: 81  Temp: 97.6 F (36.4 C)  SpO2: 96%   Filed Weights   04/12/21 0850  Weight: 128 lb 12.8 oz (58.4 kg)    Physical Exam Vitals and nursing note reviewed.  Constitutional:      Comments: Accompanied by family. Ambulating independently      HENT:     Head: Normocephalic and atraumatic.     Mouth/Throat:     Pharynx: Oropharynx is clear.  Eyes:     Extraocular Movements: Extraocular movements intact.     Pupils: Pupils are equal, round,  and reactive to light.  Cardiovascular:     Rate and Rhythm: Normal rate and regular rhythm.  Pulmonary:     Comments: Decreased breath sounds bilaterally.  Abdominal:     Palpations: Abdomen is soft.  Musculoskeletal:        General: Normal range of motion.     Cervical back: Normal range of motion.  Skin:    General: Skin is warm.  Neurological:     General: No focal deficit present.     Mental Status: She is alert and oriented to person, place, and time.  Psychiatric:        Behavior: Behavior normal.        Judgment: Judgment normal.     LABORATORY DATA:  I have reviewed the data as listed Lab Results  Component Value Date   WBC 18.2 (H) 04/12/2021   HGB 14.4 04/12/2021   HCT 43.4 04/12/2021   MCV 87.0 04/12/2021   PLT  361 04/12/2021   Recent Labs    03/27/21 1635 03/28/21 0131 04/09/21 0417 04/10/21 1048 04/12/21 1019  NA  --    < > 137 130* 127*  K  --    < > 4.7 3.9 4.0  CL  --    < > 99 95* 93*  CO2  --    < > 28 26 24   GLUCOSE  --    < > 122* 130* 111*  BUN  --    < > 20 15 11   CREATININE  --    < > 0.66 0.56 0.56  CALCIUM  --    < > 9.2 8.6* 8.8*  GFRNONAA  --    < > >60 >60 >60  PROT 7.7   < > 6.2* 6.4* 6.9  ALBUMIN 3.2*   < > 2.7* 2.8* 2.9*  AST 31   < > 24 22 30   ALT 21   < > 40 34 34  ALKPHOS 74   < > 79 73 81  BILITOT 1.2   < > 0.6 0.7 0.5  BILIDIR 0.4*  --   --   --   --   IBILI 0.8  --   --   --   --    < > = values in this interval not displayed.    RADIOGRAPHIC STUDIES: I have personally reviewed the radiological images as listed and agreed with the findings in the report. DG Chest 1 View  Result Date: 03/30/2021 CLINICAL DATA:  Possible pneumothorax EXAM: CHEST  1 VIEW COMPARISON:  03/29/2021 FINDINGS: Spiculated right upper lobe lung mass with relative hyperlucency in the vicinity but without change, prior chest CT did not demonstrate corresponding abnormality other than emphysema. No discrete pleural line is seen. Small left pleural  effusion. Mild cardiomegaly with increasing vascular congestion and pulmonary edema. Subsegmental atelectasis at the left lung base. Drainage catheter appears removed. IMPRESSION: 1. Mild cardiomegaly with increasing vascular congestion/mild pulmonary edema and small left effusion 2. Spiculated right upper lobe lung mass. Asymmetrical hyperlucency surrounding the mass without change and no definitive pleural line to suggest pneumothorax. Electronically Signed   By: Donavan Foil M.D.   On: 03/30/2021 01:11   DG Chest 1 View  Result Date: 03/27/2021 CLINICAL DATA:  Shortness of breath EXAM: CHEST  1 VIEW COMPARISON:  None. FINDINGS: Cardiomegaly. 4.8 cm mass projects within the right upper lobe. Mildly hyperexpanded lungs with coarsened interstitial markings bilaterally. No pleural effusion or pneumothorax. No acute bony findings. IMPRESSION: 1. 4.8 cm mass within the right upper lobe. CT chest with contrast is recommended for further evaluation. 2. Cardiomegaly. Electronically Signed   By: Davina Poke D.O.   On: 03/27/2021 15:52   CT Angio Chest PE W and/or Wo Contrast  Result Date: 03/27/2021 CLINICAL DATA:  Tachycardia, short of breath, pleuritic chest pain, abnormal chest x-ray EXAM: CT ANGIOGRAPHY CHEST WITH CONTRAST TECHNIQUE: Multidetector CT imaging of the chest was performed using the standard protocol during bolus administration of intravenous contrast. Multiplanar CT image reconstructions and MIPs were obtained to evaluate the vascular anatomy. CONTRAST:  191mL OMNIPAQUE IOHEXOL 350 MG/ML SOLN COMPARISON:  03/27/2021 FINDINGS: Cardiovascular: This is a technically adequate evaluation of the pulmonary vasculature. No filling defects or pulmonary emboli. There is a large pericardial effusion measuring up to 2.3 cm in thickness. Uniform decreased attenuation suggest transudative effusion. Normal caliber of the thoracic aorta. Mild atherosclerosis of the aorta and coronary vasculature.  Mediastinum/Nodes: Pathologically enlarged lymph nodes are  seen within the mediastinum, measuring up to 13 mm in short axis reference image 24/4. Adenopathy is also seen within the right supraclavicular region and left axilla. Thyroid, trachea, and esophagus are grossly unremarkable. Lungs/Pleura: There is a large spiculated right upper lobe mass corresponding to the chest x-ray finding, measuring 4.7 x 3.8 by 4.0 cm. Diffuse interlobular septal thickening throughout the right lung concerning for lymphangitic spread of disease. There is background emphysema. No effusion or pneumothorax. Central airways are patent. Upper Abdomen: No acute abnormality. Musculoskeletal: No acute or destructive bony lesions. Reconstructed images demonstrate no additional findings. Review of the MIP images confirms the above findings. IMPRESSION: 1. 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 Diffuse interlobular septal thickening throughout the right lung concerning for lymphangitic spread of disease. 2. Large pericardial effusion. 3. Lymphadenopathy within the mediastinum, right supraclavicular region, and left axilla, concerning for metastatic disease. 4. No evidence of pulmonary embolus. 5. Aortic Atherosclerosis (ICD10-I70.0) and Emphysema (ICD10-J43.9). Electronically Signed   By: Randa Ngo M.D.   On: 03/27/2021 18:24   CARDIAC CATHETERIZATION  Result Date: 03/27/2021 Conclusions: Successful pericardiocentesis and tube pericardiotomy via the subxiphoid approach, yielding 500 mL of serosanginous fluid. Recommendations: Admit to ICU. Obtain STAT portable CXR. Follow-up fluid analyses, including cytology. Repeat echocardiogram tomorrow.  Anticipate removal of drain when output is less than 50-100 mL/24 hours and minimal residual fluid is present by echo. Nelva Bush, MD [facility]  US Renal  Result Date: 03/27/2021 CLINICAL DATA:  UTI EXAM: RENAL / URINARY TRACT ULTRASOUND COMPLETE  COMPARISON:  CT 03/27/2021 FINDINGS: Right Kidney: Renal measurements: 10.9 x 4 x 4.4 cm = volume: 101 mL. Echogenicity within normal limits. No mass or hydronephrosis visualized. Left Kidney: Renal measurements: 11.1 x 4.9 x 5.8 cm = volume: 165 mL. Echogenicity within normal limits. No mass or hydronephrosis visualized. Bladder: Appears normal for degree of bladder distention. Other: None. IMPRESSION: Negative renal ultrasound Electronically Signed   By: Donavan Foil M.D.   On: 03/27/2021 19:37   DG Chest Port 1 View  Result Date: 04/04/2021 CLINICAL DATA:  Respiratory failure. EXAM: PORTABLE CHEST 1 VIEW COMPARISON:  04/01/2021.  CT 03/27/2021. FINDINGS: Mediastinum and hilar structures are stable. Borderline cardiomegaly. No pulmonary venous congestion. Persistent large mass right upper lung again noted. Diffuse bilateral interstitial prominence again noted. Although interstitial edema and/or pneumonitis could present this fashion, lymphangitic tumor spread again cannot be excluded. Bibasilar atelectasis. Right base infiltrate. Right base pneumonia cannot be excluded. Small bilateral pleural effusions again noted. No acute bony abnormality identified. IMPRESSION: 1.  Large right upper lung mass again noted without interim change. 2. Diffuse bilateral interstitial prominence again noted. Although interstitial edema and/or pneumonitis could present in this fashion, lymphangitic tumor spread again cannot be excluded. 3. Bibasilar atelectasis. Right base infiltrate. Right base pneumonia cannot be excluded. Small bilateral pleural effusions again noted. 4.  Borderline cardiomegaly. Electronically Signed   By: Marcello Moores  Register   On: 04/04/2021 07:53   DG Chest Port 1 View  Result Date: 04/01/2021 CLINICAL DATA:  Shortness of breath EXAM: PORTABLE CHEST 1 VIEW COMPARISON:  CT 03/27/2021, radiograph 03/30/2021 FINDINGS: Diminishing lung volumes with increasing veiling opacity in the lung bases, right greater  than left which could reflect developing pleural effusions. Pulmonary vascularity is indistinct. Cardiomediastinal silhouette is stable from prior with known large pericardial effusion. Redemonstration of a large rounded masslike opacity in the right upper lung corresponding to the known right upper lung malignancy. No acute or worrisome  osseous or soft tissue abnormality of the chest wall. IMPRESSION: Diminishing lung volumes. Increasing veiling opacity may reflect developing pleural effusions. Additional opacity in the bases may reflect some passive atelectatic change or underlying airspace opacity. Diffusely worsening interstitial opacity, concerning for developing edema as well given pulmonary vascular congestion. Underlying lymphangitic spread is not fully excluded. Redemonstrated masslike opacity in the right upper lobe. Electronically Signed   By: Lovena Le M.D.   On: 04/01/2021 21:16   DG Chest Port 1 View  Result Date: 03/29/2021 CLINICAL DATA:  Shortness of breath and chest pain EXAM: PORTABLE CHEST 1 VIEW COMPARISON:  03/27/2021 FINDINGS: Cardiac shadow is mildly enlarged but stable. Spiculated right upper lobe mass lesion is again noted. Mild increase in parenchymal edema is seen. Small bore catheter is again noted over the cardiac shadow likely related to a pericardial drain. No pneumothorax is noted. IMPRESSION: Increasing edema particularly on the right. Stable right upper lobe mass lesion. No new focal abnormality is noted. Electronically Signed   By: Inez Catalina M.D.   On: 03/29/2021 09:02   DG Chest Port 1 View  Result Date: 03/27/2021 CLINICAL DATA:  Cardiac tamponade EXAM: PORTABLE CHEST 1 VIEW COMPARISON:  Chest x-ray 03/27/2021, CT chest 03/27/2021 FINDINGS: Right upper lobe lung mass redemonstrated. Emphysema with bronchitic changes. Cardiomegaly. Interim placement of drainage catheter over the heart presumably a pericardial drainage catheter. Cardiac silhouette decreased compared  to prior. Slight increased interstitial opacity possibly superimposed edema. No pneumothorax IMPRESSION: 1. Placement of presumed pericardial drainage catheter with decreased cardiomegaly 2. Emphysema with chronic bronchitic change, slight increased interstitial opacity may reflect superimposed mild edema 3. Redemonstrated spiculated lung mass at the right apex Electronically Signed   By: Donavan Foil M.D.   On: 03/27/2021 22:28   ECHOCARDIOGRAM COMPLETE  Result Date: 03/28/2021    ECHOCARDIOGRAM REPORT   Patient Name:   Nichole Park Date of Exam: 03/28/2021 Medical Rec #:  [id number]   Height:       61.5 in Accession #:    [id number]  Weight:       128.7 lb Date of Birth:  10/13/1960  BSA:          1.576 m Patient Age:    16 years    BP:           128/88 mmHg Patient Gender: F           HR:           105 bpm. Exam Location:  [facility] Procedure: 2D Echo, Color Doppler, Cardiac Doppler and Intracardiac            Opacification Agent Indications:     I31.4 Tamponade  History:         Patient has no prior history of Echocardiogram examinations.                  Signs/Symptoms:Shortness of Breath; Risk Factors:Current                  Smoker. Graves disease.  Sonographer:     Charmayne Sheer RDCS (AE) Referring Phys:  [id number] CHRI[place] END Diagnosing Phys: Kate Sable MD  Sonographer Comments: Technically difficult study due to poor echo windows. IMPRESSIONS  1. Left ventricular ejection fraction, by estimation, is 55%. The left ventricle has normal function. The left ventricle has no regional wall motion abnormalities. Left ventricular diastolic parameters are consistent with Grade II diastolic dysfunction (pseudonormalization).  2. Right ventricular systolic function is normal. The  right ventricular size is normal.  3. The mitral valve is normal in structure. No evidence of mitral valve regurgitation.  4. The aortic valve was not well visualized. Aortic valve regurgitation is not visualized.  5. The inferior vena cava  is dilated in size with >50% respiratory variability, suggesting right atrial pressure of 8 mmHg. FINDINGS  Left Ventricle: Left ventricular ejection fraction, by estimation, is 55%. The left ventricle has normal function. The left ventricle has no regional wall motion abnormalities. Definity contrast agent was given IV to delineate the left ventricular endocardial borders. The left ventricular internal cavity size was normal in size. There is no left ventricular hypertrophy. Left ventricular diastolic parameters are consistent with Grade II diastolic dysfunction (pseudonormalization). Right Ventricle: The right ventricular size is normal. No increase in right ventricular wall thickness. Right ventricular systolic function is normal. Left Atrium: Left atrial size was normal in size. Right Atrium: Right atrial size was normal in size. Pericardium: Trivial pericardial effusion is present. Mitral Valve: The mitral valve is normal in structure. No evidence of mitral valve regurgitation. Tricuspid Valve: The tricuspid valve is not well visualized. Tricuspid valve regurgitation is not demonstrated. Aortic Valve: The aortic valve was not well visualized. Aortic valve regurgitation is not visualized. Aortic valve mean gradient measures 2.0 mmHg. Aortic valve peak gradient measures 4.7 mmHg. Aortic valve area, by VTI measures 3.44 cm. Pulmonic Valve: The pulmonic valve was not well visualized. Pulmonic valve regurgitation is not visualized. Aorta: The aortic root is normal in size and structure. Venous: The inferior vena cava is dilated in size with greater than 50% respiratory variability, suggesting right atrial pressure of 8 mmHg. IAS/Shunts: No atrial level shunt detected by color flow Doppler.  LEFT VENTRICLE PLAX 2D LVIDd:         3.90 cm  Diastology LVIDs:         3.30 cm  LV e' medial:    9.46 cm/s LV PW:         1.20 cm  LV E/e' medial:  8.9 LV IVS:        0.90 cm  LV e' lateral:   4.57 cm/s LVOT diam:     2.20 cm   LV E/e' lateral: 18.4 LV SV:         54 LV SV Index:   34 LVOT Area:     3.80 cm  LEFT ATRIUM           Index LA diam:      3.70 cm 2.35 cm/m LA Vol (A4C): 38.0 ml 24.11 ml/m  AORTIC VALVE                   PULMONIC VALVE AV Area (Vmax):    3.40 cm    PV Vmax:       0.97 m/s AV Area (Vmean):   3.66 cm    PV Vmean:      65.700 cm/s AV Area (VTI):     3.44 cm    PV VTI:        0.122 m AV Vmax:           108.00 cm/s PV Peak grad:  3.7 mmHg AV Vmean:          71.900 cm/s PV Mean grad:  2.0 mmHg AV VTI:            0.156 m AV Peak Grad:      4.7 mmHg AV Mean Grad:      2.0 mmHg  LVOT Vmax:         96.60 cm/s LVOT Vmean:        69.300 cm/s LVOT VTI:          0.141 m LVOT/AV VTI ratio: 0.90  AORTA Ao Root diam: 2.80 cm MITRAL VALVE MV Area (PHT): 5.07 cm    SHUNTS MV Decel Time: 150 msec    Systemic VTI:  0.14 m MV E velocity: 84.23 cm/s  Systemic Diam: 2.20 cm MV A velocity: 88.23 cm/s MV E/A ratio:  0.95 Kate Sable MD Electronically signed by Kate Sable MD Signature Date/Time: 03/28/2021/1:51:00 PM    Final    ECHOCARDIOGRAM LIMITED  Result Date: 04/02/2021    ECHOCARDIOGRAM LIMITED REPORT   Patient Name:   Nichole Park Date of Exam: 04/02/2021 Medical Rec #:  [id number]   Height:       61.5 in Accession #:    [id number]  Weight:       137.6 lb Date of Birth:  [birth year]  BSA:          1.621 m Patient Age:    27 years    BP:           106/77 mmHg Patient Gender: F           HR:           87 bpm. Exam Location:  [facility] Procedure: Limited Echo, Color Doppler and Cardiac Doppler Indications:     Pericardial Effusion I31.3  History:         Patient has prior history of Echocardiogram examinations, most                  recent 03/29/2021. COPD; Risk Factors:Hypertension.  Sonographer:     Sherrie Sport RDCS (AE) Referring Phys:  [id number] [place] Diagnosing Phys: Harrell Gave End MD  Sonographer Comments: Image acquisition challenging due to COPD. IMPRESSIONS  1. Left ventricular ejection fraction, by  estimation, is >55%. The left ventricle has normal function.  2. Right ventricular systolic function is normal. Moderately increased right ventricular wall thickness.  3. Echogenic material in the pericardial space is noted, which could represent epicardial fat. However, in the setting of malignant pericardial effusion, tumor cannot be excluded. FINDINGS  Left Ventricle: Left ventricular ejection fraction, by estimation, is >55%. The left ventricle has normal function. The left ventricular internal cavity size was normal in size. There is borderline left ventricular hypertrophy. Right Ventricle: Moderately increased right ventricular wall thickness. Right ventricular systolic function is normal. Pericardium: Echogenic material in the pericardial space is noted, which could represent epicardial fat. However, in the setting of malignant pericardial effusion, tumor cannot be excluded. Trivial pericardial effusion is present. LEFT VENTRICLE PLAX 2D LVIDd:         4.23 cm LVIDs:         2.58 cm LV PW:         1.01 cm LV IVS:        1.01 cm LVOT diam:     2.00 cm LVOT Area:     3.14 cm  LEFT ATRIUM         Index LA diam:    3.60 cm 2.22 cm/m   AORTA Ao Root diam: 2.90 cm  SHUNTS Systemic Diam: 2.00 cm Nelva Bush MD Electronically signed by Nelva Bush MD Signature Date/Time: 04/02/2021/12:36:25 PM    Final    ECHOCARDIOGRAM LIMITED  Result Date: 03/29/2021    ECHOCARDIOGRAM LIMITED REPORT   Patient Name:  Nichole Park Date of Exam: 03/29/2021 Medical Rec #:  [id number]   Height:       61.5 in Accession #:    [id number]  Weight:       136.5 lb Date of Birth:  07-17-1961  BSA:          1.615 m Patient Age:    23 years    BP:           137/91 mmHg Patient Gender: F           HR:           116 bpm. Exam Location:  [facility] Procedure: Cardiac Doppler, Color Doppler and Limited Echo Indications:     Pericardial Effusion I31.3  History:         Patient has prior history of Echocardiogram examinations, most                   recent 03/28/2021. Risk Factors:Hypertension. Cardiac tamponade.  Sonographer:     Sherrie Sport RDCS (AE) Referring Phys:  [id number] [place] Diagnosing Phys: Ida Rogue MD  Sonographer Comments: Technically difficult study due to poor echo windows. IMPRESSIONS  1. Trvial pericardial effusion.  2. Left ventricular ejection fraction, by estimation, is 60 to 65%. The left ventricle has normal function. The left ventricle has no regional wall motion abnormalities.  3. Right ventricular systolic function is normal. The right ventricular size is normal.  4. The mitral valve is normal in structure. No evidence of mitral valve regurgitation. No evidence of mitral stenosis. FINDINGS  Left Ventricle: Left ventricular ejection fraction, by estimation, is 60 to 65%. The left ventricle has normal function. The left ventricle has no regional wall motion abnormalities. The left ventricular internal cavity size was normal in size. There is  no left ventricular hypertrophy. Right Ventricle: The right ventricular size is normal. No increase in right ventricular wall thickness. Right ventricular systolic function is normal. Left Atrium: Left atrial size was normal in size. Right Atrium: Right atrial size was normal in size. Pericardium: Trivial pericardial effusion is present. Mitral Valve: The mitral valve is normal in structure. No evidence of mitral valve stenosis. Tricuspid Valve: The tricuspid valve is normal in structure. Tricuspid valve regurgitation is not demonstrated. No evidence of tricuspid stenosis. Aortic Valve: The aortic valve was not well visualized. Aortic valve regurgitation is not visualized. No aortic stenosis is present. Pulmonic Valve: The pulmonic valve was normal in structure. Pulmonic valve regurgitation is not visualized. No evidence of pulmonic stenosis. Aorta: The aortic root is normal in size and structure. Venous: The inferior vena cava is normal in size with greater than 50% respiratory  variability, suggesting right atrial pressure of 3 mmHg. IAS/Shunts: No atrial level shunt detected by color flow Doppler. LEFT VENTRICLE PLAX 2D LVIDd:         4.04 cm LVIDs:         2.55 cm LV PW:         1.31 cm LV IVS:        0.93 cm LVOT diam:     2.00 cm LVOT Area:     3.14 cm  LEFT ATRIUM           Index      RIGHT ATRIUM           Index LA diam:      3.10 cm 1.92 cm/m RA Area:     10.20 cm LA Vol (A4C): 11.2 ml 6.93 ml/m  RA Volume:   23.00 ml  14.24 ml/m   AORTA Ao Root diam: 2.80 cm TRICUSPID VALVE TR Peak grad:   4.8 mmHg TR Vmax:        110.00 cm/s  SHUNTS Systemic Diam: 2.00 cm Ida Rogue MD Electronically signed by Ida Rogue MD Signature Date/Time: 03/29/2021/3:32:05 PM    Final     ASSESSMENT & PLAN:   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 I reviewed the stage as stage IV/pathology as non-small cell/adenocarcinoma with patient. Also reviewed the images in detail.  Recommend re: NGS/adequate tissue. Patient will need brain imaging  # Recommend carboplatin and Alimta-keytruda  Chemo-immunotherapy every 3 weeks palliative basis. Recommend A00 injections; folic acid once a day; dexamethasone 4 mg twice a day starting day before chemotherapy for 3 days.  Understand treatments are palliative and not curative.  The median survival anywhere between 1 to 2 years.    # Malignant pericardial tamponade-s/p pericardiocentesis;-secondary to lung adenocarcinoma-repeat AUG 2022-2D echo negative for any pericardial effusion.  #Acute respiratory failure-multifactorial-malignancy/lymphangitic spread/COPD stable-significant provement s/p/bronchodilators/diuretics-STABLE.  We will decide on Lasix based upon her labs from today.   #A. Fib-amiodarone/Cardizem as per cardiology- STABLE/rate controlled-? Anti-coagulation-on hold because of concern for hemorrhagic pericardial effusion.  # PLAN: At the end of discussion-patient is interested in proceeding with  therapy; however she states that she is not finalized yet.  Discussed regarding NGS/lack of insurance.  Patient is back to [place] regarding her plan.  I have asked the patient to decide on treatment plan as soon as possible-given the concern for rapid progression of disease/symptomatic disease.  Check labs today CBC CMP CEA.  Chemotherapy education.   # I reviewed the blood work- with the patient in detail; also reviewed the imaging independently [as summarized above]; and with the patient in detail.   # 40 minutes face-to-face with the patient discussing the above plan of care; more than 50% of time spent on prognosis/ natural history; counseling and coordination.   All questions were answered. The patient knows to call the clinic with any problems, questions or concerns.    Cammie Sickle, MD 04/15/2021 12:01 AM

## 2021-04-12 NOTE — Progress Notes (Signed)
Patient would like to know about taking sodium tablets- she does not have any tablets for this. She was under the impression she should have had   Patient c/o white patches on tongue and sore throat. She is asking for prescription for "thrush."  Patient would like labs to be rechecked to determine if she needs oral potassium supplements.

## 2021-04-12 NOTE — Assessment & Plan Note (Addendum)
#  Stage IV-lung cancer-adenocarcinoma. I reviewed the stage as stage IV/pathology as non-small cell/adenocarcinoma with patient. Also reviewed the images in detail.  Recommend re: NGS/adequate tissue. Patient will need brain imaging  # Recommend carboplatin and Alimta-keytruda  Chemo-immunotherapy every 3 weeks palliative basis. Recommend T47 injections; folic acid once a day; dexamethasone 4 mg twice a day starting day before chemotherapy for 3 days.  Understand treatments are palliative and not curative.  The median survival anywhere between 1 to 2 years.    # Malignant pericardial tamponade-s/p pericardiocentesis;-secondary to lung adenocarcinoma-repeat AUG 2022-2D echo negative for any pericardial effusion.  #Acute respiratory failure-multifactorial-malignancy/lymphangitic spread/COPD stable-significant provement s/p/bronchodilators/diuretics-STABLE.  We will decide on Lasix based upon her labs from today.   #A. Fib-amiodarone/Cardizem as per cardiology- STABLE/rate controlled-? Anti-coagulation-on hold because of concern for hemorrhagic pericardial effusion.  # PLAN: At the end of discussion-patient is interested in proceeding with therapy; however she states that she is not finalized yet.  Discussed regarding NGS/lack of insurance.  Patient is back to [place] regarding her plan.  I have asked the patient to decide on treatment plan as soon as possible-given the concern for rapid progression of disease/symptomatic disease.  Check labs today CBC CMP CEA.  Chemotherapy education.   # I reviewed the blood work- with the patient in detail; also reviewed the imaging independently [as summarized above]; and with the patient in detail.   # 40 minutes face-to-face with the patient discussing the above plan of care; more than 50% of time spent on prognosis/ natural history; counseling and coordination.

## 2021-04-12 NOTE — Telephone Encounter (Signed)
Based on previous message, pt is applying for disability and needs staging info on note from visit today. Once Dr. B completes his note, pt's daughter can access it from [facility] and turn into disability.

## 2021-04-13 ENCOUNTER — Encounter: Payer: Self-pay | Admitting: Internal Medicine

## 2021-04-13 LAB — CEA: CEA: 110 ng/mL — ABNORMAL HIGH (ref 0.0–4.7)

## 2021-04-15 ENCOUNTER — Encounter: Payer: Self-pay | Admitting: Internal Medicine

## 2021-04-16 ENCOUNTER — Telehealth: Payer: Self-pay | Admitting: Internal Medicine

## 2021-04-16 ENCOUNTER — Telehealth: Payer: Self-pay | Admitting: *Deleted

## 2021-04-16 ENCOUNTER — Ambulatory Visit: Payer: Self-pay | Admitting: Pharmacy Technician

## 2021-04-16 ENCOUNTER — Other Ambulatory Visit: Payer: Self-pay

## 2021-04-16 ENCOUNTER — Inpatient Hospital Stay: Payer: Medicaid Other

## 2021-04-16 ENCOUNTER — Other Ambulatory Visit: Payer: Self-pay | Admitting: Internal Medicine

## 2021-04-16 DIAGNOSIS — B370 Candidal stomatitis: Secondary | ICD-10-CM

## 2021-04-16 DIAGNOSIS — Z79899 Other long term (current) drug therapy: Secondary | ICD-10-CM

## 2021-04-16 DIAGNOSIS — C3411 Malignant neoplasm of upper lobe, right bronchus or lung: Secondary | ICD-10-CM

## 2021-04-16 MED ORDER — PROCHLORPERAZINE MALEATE 10 MG PO TABS
10.0000 mg | ORAL_TABLET | Freq: Four times a day (QID) | ORAL | 1 refills | Status: DC | PRN
  Filled 2021-04-16: qty 40, 10d supply, fill #0
  Filled 2021-04-27: qty 40, 10d supply, fill #1

## 2021-04-16 MED ORDER — FOLIC ACID 1 MG PO TABS
1.0000 mg | ORAL_TABLET | Freq: Every day | ORAL | 1 refills | Status: DC
  Filled 2021-04-16: qty 30, 30d supply, fill #0
  Filled 2021-05-14: qty 30, 30d supply, fill #1
  Filled 2021-06-11: qty 30, 30d supply, fill #2
  Filled 2021-07-11: qty 30, 30d supply, fill #3
  Filled 2021-08-14: qty 60, 60d supply, fill #4

## 2021-04-16 MED ORDER — ONDANSETRON HCL 4 MG PO TABS
ORAL_TABLET | ORAL | 1 refills | Status: AC
  Filled 2021-04-16: qty 80, 14d supply, fill #0
  Filled 2021-04-27: qty 80, 14d supply, fill #1

## 2021-04-16 MED ORDER — DEXAMETHASONE 4 MG PO TABS
ORAL_TABLET | ORAL | 0 refills | Status: DC
  Filled 2021-04-16: qty 60, fill #0

## 2021-04-16 NOTE — Telephone Encounter (Signed)
Per Dr B- he will reach out to daughter.

## 2021-04-16 NOTE — Progress Notes (Signed)
Completed [facility] application and contract.  Patient agreed to all terms of the [facility] contract.    Patient approved to receive medication assistance at [facility] until time for re-certification in 2023, and as long as eligibility criteria continues to be met.    Provided patient with community resource material based on her particular needs.    [place] Care Manager [facility]  

## 2021-04-16 NOTE — Progress Notes (Signed)
The following Medication: Alimta is approved for drug replacement program by [employer]. The enrollment period is from 04/16/2021 to 04/16/2022.  Reason for Assistance: SELF ID: [id number] First DOS:TBD  Madalyn Rob, CPhT IV Drug Replacement Specialist  [place] Phone: [phone]

## 2021-04-16 NOTE — Telephone Encounter (Signed)
Meriam Sprague B  You 28 minutes ago (2:34 PM)   Ladera, Lenia D  Mychart, Generic 1 hour ago (1:49 PM)    Okay  there is slight rash stomach and back that itches     Mychart, Generic  Barbette Or D 1 hour ago (1:32 PM)   GM   Appointment Information:     Visit Type: Office Visit         Date: 04/18/2021                 Dept: [place]                 Provider: Harrell Gave End                 Time: 4:00 PM                 Length: 20 min   Appt Status: Scheduled     Appt Instructions:   Please arrive 15 minutes prior to your appointment. This will allow [place] to verify and update your medical record and ensure a full appointment for you within the time allotted.   Attempted to call pt regarding MyChart schedule message that was sent above to triage.  Pt did not answer at this time. Lmtcb.

## 2021-04-16 NOTE — Telephone Encounter (Signed)
On 8/08- I spoke to daughter/ pat over the phone-patient has a rash on the skin//thrush-not improved on nystatin.  Recommend evaluation in the symptom management clinic tomorrow 08/09.   Benjie Karvonen- please call pt tomorrow -08/09; no labs needed.  C-please schedule PET scan/MRI brain ASAP; Schedule-early next week-MD; labs-CBC CMP; carbo-Alimta; Keytruda.   Hayley-please inform patient patient that-I will be sending antiemetics/dexamethasone/folic acid.  Also get the consent for NGS when she comes to clinic tomorrow.  GB

## 2021-04-16 NOTE — Telephone Encounter (Signed)
N[place]ole [place]- patient was given a written prescription to take to [facility] last week.

## 2021-04-16 NOTE — Telephone Encounter (Signed)
Pt daughter is requesting a call back from Dr. B about her mother. She has a few questions she needs answers about.

## 2021-04-17 ENCOUNTER — Encounter: Payer: Self-pay | Admitting: *Deleted

## 2021-04-17 ENCOUNTER — Other Ambulatory Visit: Payer: Self-pay

## 2021-04-17 ENCOUNTER — Encounter: Payer: Self-pay | Admitting: Internal Medicine

## 2021-04-17 ENCOUNTER — Inpatient Hospital Stay (HOSPITAL_BASED_OUTPATIENT_CLINIC_OR_DEPARTMENT_OTHER): Payer: Medicaid Other | Admitting: Hospice and Palliative Medicine

## 2021-04-17 VITALS — BP 105/68 | HR 102 | Temp 97.5°F | Resp 18

## 2021-04-17 DIAGNOSIS — Z5112 Encounter for antineoplastic immunotherapy: Secondary | ICD-10-CM | POA: Diagnosis not present

## 2021-04-17 DIAGNOSIS — C3411 Malignant neoplasm of upper lobe, right bronchus or lung: Secondary | ICD-10-CM

## 2021-04-17 MED ORDER — DEXAMETHASONE 4 MG PO TABS: ORAL_TABLET | ORAL | 0 refills | Status: DC

## 2021-04-17 MED ORDER — FLUCONAZOLE 100 MG PO TABS
ORAL_TABLET | ORAL | 0 refills | Status: DC
  Filled 2021-04-17: qty 15, 14d supply, fill #0

## 2021-04-17 MED ORDER — LIDOCAINE VISCOUS HCL 2 % MT SOLN
15.0000 mL | Freq: Four times a day (QID) | OROMUCOSAL | 0 refills | Status: DC | PRN
  Filled 2021-04-17: qty 200, 4d supply, fill #0

## 2021-04-17 NOTE — Progress Notes (Signed)
Pt reports that she is out of Nystatin, and reports that her oral thrush has not gotten any better. Also reports rash to her abdomen and back. [name] significant other is present with her today. Pt and her SO argue back and forth about patient's symptoms. SO attempts to talk over patient, which makes pt frustrated. SO asked to allow pt to voice concerns and symptoms. Pt asked if she would like SO to leave the room, as he is not listed on her DPR. She states that she is fine with him being present.

## 2021-04-17 NOTE — Telephone Encounter (Signed)
Will order [facility] via online portal and sign patient up for financial assistance as well. Will call pt today to inform of prescriptions sent into pharmacy.

## 2021-04-17 NOTE — Addendum Note (Signed)
Addended by: Telford Nab on: 04/17/2021 09:11 AM   Modules accepted: Orders

## 2021-04-17 NOTE — Progress Notes (Signed)
Symptom Management [place]  [phone] [phone]  Patient Care Team: Pcp, No as PCP - General End, Harrell Gave, MD as PCP - Cardiology (Cardiology) Telford Nab, RN as Oncology Nurse Navigator   Name of the patient: Nichole Park  [id number]  19-May-1961   Date of visit: 04/17/21  Reason for Consult:  Nichole Park is a 60 year old woman with multiple medical problems including remote history of Graves' disease and tobacco abuse who was hospitalized 03/27/2021 to 04/09/2021 with respiratory failure and was found to have a large right upper lobar mass with lymphangitic spread and pericardial effusion with tamponade requiring pericardial drain.  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  Hospitalization was complicated by paroxysmal atrial fibrillation with intermittent RVR.  Patient was started on Cardizem and amiodarone.  Patient was started on propylthiouricil as it was felt that her Graves' disease could be a potential contributing factor to the A. fib RVR. Patient developed oral candidiasis and was started on nystatin.  Today, she presents to [facility] for evaluation and management of persistent oral thrush.  Patient is completed course of nystatin and had some improvement in symptoms but thrush persists.  She reports some oral discomfort with eating/drinking.  Additionally, she has some upper esophageal discomfort with swallowing.  Patient endorses a rash on her chest and back over the past week or so.  Over the past couple days, she feels like it is slightly improving.  Rash is pruritic.  She says she had a similar rash on Tapazole and feels like this is again related to PTU.   Denies any neurologic complaints. Denies recent fevers or illnesses. Denies any easy bleeding or bruising. Reports good appetite and denies weight loss. Denies chest pain. Denies any nausea, vomiting, constipation, or diarrhea. Denies urinary  complaints. Patient offers no further specific complaints today.  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  MEDICATIONS:  Current Outpatient Medications  Medication Sig Dispense Refill   albuterol (VENTOLIN HFA) 108 (90 Base) MCG/ACT inhaler Inhale 2 puffs into the lungs every 6 (six) hours as needed for wheezing or shortness of breath. 8.5 g 0   amiodarone (PACERONE) 200 MG tablet Take 1 tablet (200 mg total) by mouth 2 (two) times daily. 60 tablet 0   dexamethasone (DECADRON) 4 MG tablet Take 1 tablet by mouth in the morning and evening 2 days. Take the day prior and the day after chemo. DO  NOT take on the day of chemo. 60 tablet 0   diltiazem (DILACOR XR) 240 MG 24 hr capsule Take 1 capsule (240 mg total) by mouth once daily. 30 capsule 0   fluconazole (DIFLUCAN) 100 MG tablet Take two tablets (200mg ) by mouth x 1 day and then take one tablet (100mg ) daily 15 tablet 0   folic acid  (FOLVITE) 1 MG tablet Take 1 tablet (1 mg total) by mouth once daily. 90 tablet 1   furosemide (LASIX) 40 MG tablet Take 1 tablet (40 mg total) by mouth once daily. 30 tablet 0   lidocaine (XYLOCAINE) 2 % solution Use as directed 15 mLs in the mouth or throat every 6 (six) hours as needed for mouth pain. Swish and spit as needed for pain 240 mL 0   Multiple Vitamin (MULTI-VITAMINS) TABS Take 1 tablet by mouth daily.     nystatin (MYCOSTATIN) 100000 UNIT/ML suspension Take 5 mLs (500,000 Units total) by mouth 4 (four) times daily. 60 mL 0   nystatin (MYCOSTATIN) 100000 UNIT/ML suspension Take 5 mLs (500,000 Units total) by mouth 4 (four) times daily. 60 mL 0   ondansetron (ZOFRAN) 4 MG tablet Take 2 tablets (8mg  total) by mouth once every 8 hours as needed for nausea and vomiting. 80 tablet 1   prochlorperazine (COMPAZINE) 10 MG tablet Take 1 tablet (10 mg total) by mouth every 6 (six) hours as needed for nausea or vomiting. 40 tablet 1   propylthiouracil (PTU) 50 MG tablet Take 2 tablets (100 mg total) by mouth every 8 (eight) hours. 180 tablet 0   No current facility-administered medications for this visit.    VITAL SIGNS: BP 105/68   Pulse (!) 102   Temp (!) 97.5 F (36.4 C) (Tympanic)   Resp 18  There were no vitals filed for this visit.  Estimated body mass index is 24.34 kg/m as calculated from the following:   Height as of 04/12/21: 5\' 1"  (1.549 m).   Weight as of 04/12/21: 128 lb 12.8 oz (58.4 kg).  LABS: CBC:    Component Value Date/Time   WBC 18.2 (H) 04/12/2021 1019   HGB 14.4 04/12/2021 1019   HGB 15.9 12/01/2013 1440   HCT 43.4 04/12/2021 1019   HCT 46.8 12/01/2013 1440   PLT 361 04/12/2021 1019   PLT 236 12/01/2013 1440   MCV 87.0 04/12/2021 1019   MCV 87 12/01/2013 1440   NEUTROABS 14.6 (H) 04/12/2021 1019   LYMPHSABS 1.4 04/12/2021 1019   MONOABS 1.5 (H) 04/12/2021 1019   EOSABS 0.2 04/12/2021 1019   BASOSABS 0.1 04/12/2021 1019   Comprehensive Metabolic  Panel:    Component Value Date/Time   NA 127 (L) 04/12/2021 1019   NA 137 12/01/2013 1440   K 4.0 04/12/2021 1019   K 3.7 12/01/2013 1440   CL 93 (L) 04/12/2021 1019   CL 108 (H) 12/01/2013 1440   CO2 24 04/12/2021 1019   CO2 25 12/01/2013 1440   BUN 11 04/12/2021 1019   BUN 8 12/01/2013 1440   CREATININE 0.56 04/12/2021 1019   CREATININE 0.43 (L) 12/01/2013 1440   GLUCOSE 111 (H) 04/12/2021 1019   GLUCOSE 117 (H) 12/01/2013 1440   CALCIUM 8.8 (L) 04/12/2021 1019   CALCIUM 8.8 12/01/2013 1440   AST 30 04/12/2021 1019   AST 35 12/01/2013 1440   ALT 34 04/12/2021 1019   ALT 36 12/01/2013 1440   ALKPHOS 81 04/12/2021 1019   ALKPHOS 327 (H) 12/01/2013 1440  BILITOT 0.5 04/12/2021 1019   BILITOT 0.4 12/01/2013 1440   PROT 6.9 04/12/2021 1019   PROT 7.8 12/01/2013 1440   ALBUMIN 2.9 (L) 04/12/2021 1019   ALBUMIN 3.7 12/01/2013 1440    RADIOGRAPHIC STUDIES: DG Chest 1 View  Result Date: 03/30/2021 CLINICAL DATA:  Possible pneumothorax EXAM: CHEST  1 VIEW COMPARISON:  03/29/2021 FINDINGS: Spiculated right upper lobe lung mass with relative hyperlucency in the vicinity but without change, prior chest CT did not demonstrate corresponding abnormality other than emphysema. No discrete pleural line is seen. Small left pleural effusion. Mild cardiomegaly with increasing vascular congestion and pulmonary edema. Subsegmental atelectasis at the left lung base. Drainage catheter appears removed. IMPRESSION: 1. Mild cardiomegaly with increasing vascular congestion/mild pulmonary edema and small left effusion 2. Spiculated right upper lobe lung mass. Asymmetrical hyperlucency surrounding the mass without change and no definitive pleural line to suggest pneumothorax. Electronically Signed   By: Donavan Foil M.D.   On: 03/30/2021 01:11   DG Chest 1 View  Result Date: 03/27/2021 CLINICAL DATA:  Shortness of breath EXAM: CHEST  1 VIEW COMPARISON:  None. FINDINGS: Cardiomegaly. 4.8 cm mass projects  within the right upper lobe. Mildly hyperexpanded lungs with coarsened interstitial markings bilaterally. No pleural effusion or pneumothorax. No acute bony findings. IMPRESSION: 1. 4.8 cm mass within the right upper lobe. CT chest with contrast is recommended for further evaluation. 2. Cardiomegaly. Electronically Signed   By: Davina Poke D.O.   On: 03/27/2021 15:52   CT Angio Chest PE W and/or Wo Contrast  Result Date: 03/27/2021 CLINICAL DATA:  Tachycardia, short of breath, pleuritic chest pain, abnormal chest x-ray EXAM: CT ANGIOGRAPHY CHEST WITH CONTRAST TECHNIQUE: Multidetector CT imaging of the chest was performed using the standard protocol during bolus administration of intravenous contrast. Multiplanar CT image reconstructions and MIPs were obtained to evaluate the vascular anatomy. CONTRAST:  171mL OMNIPAQUE IOHEXOL 350 MG/ML SOLN COMPARISON:  03/27/2021 FINDINGS: Cardiovascular: This is a technically adequate evaluation of the pulmonary vasculature. No filling defects or pulmonary emboli. There is a large pericardial effusion measuring up to 2.3 cm in thickness. Uniform decreased attenuation suggest transudative effusion. Normal caliber of the thoracic aorta. Mild atherosclerosis of the aorta and coronary vasculature. Mediastinum/Nodes: Pathologically enlarged lymph nodes are seen within the mediastinum, measuring up to 13 mm in short axis reference image 24/4. Adenopathy is also seen within the right supraclavicular region and left axilla. Thyroid, trachea, and esophagus are grossly unremarkable. Lungs/Pleura: There is a large spiculated right upper lobe mass corresponding to the chest x-ray finding, measuring 4.7 x 3.8 by 4.0 cm. Diffuse interlobular septal thickening throughout the right lung concerning for lymphangitic spread of disease. There is background emphysema. No effusion or pneumothorax. Central airways are patent. Upper Abdomen: No acute abnormality. Musculoskeletal: No acute or  destructive bony lesions. Reconstructed images demonstrate no additional findings. Review of the MIP images confirms the above findings. IMPRESSION: 1. 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 Diffuse interlobular septal thickening throughout the right lung concerning for lymphangitic spread of disease. 2. Large pericardial effusion. 3. Lymphadenopathy within the mediastinum, right supraclavicular region, and left axilla, concerning for metastatic disease. 4. No evidence of pulmonary embolus. 5. Aortic Atherosclerosis (ICD10-I70.0) and Emphysema (ICD10-J43.9). Electronically Signed   By: Randa Ngo M.D.   On: 03/27/2021 18:24   CARDIAC CATHETERIZATION  Result Date: 03/27/2021 Conclusions: Successful pericardiocentesis and tube pericardiotomy via the subxiphoid approach, yielding 500 mL of serosanginous fluid. Recommendations: Admit to ICU.  Obtain STAT portable CXR. Follow-up fluid analyses, including cytology. Repeat echocardiogram tomorrow.  Anticipate removal of drain when output is less than 50-100 mL/24 hours and minimal residual fluid is present by echo. Nelva Bush, MD [facility]  US Renal  Result Date: 03/27/2021 CLINICAL DATA:  UTI EXAM: RENAL / URINARY TRACT ULTRASOUND COMPLETE COMPARISON:  CT 03/27/2021 FINDINGS: Right Kidney: Renal measurements: 10.9 x 4 x 4.4 cm = volume: 101 mL. Echogenicity within normal limits. No mass or hydronephrosis visualized. Left Kidney: Renal measurements: 11.1 x 4.9 x 5.8 cm = volume: 165 mL. Echogenicity within normal limits. No mass or hydronephrosis visualized. Bladder: Appears normal for degree of bladder distention. Other: None. IMPRESSION: Negative renal ultrasound Electronically Signed   By: Donavan Foil M.D.   On: 03/27/2021 19:37   DG Chest Port 1 View  Result Date: 04/04/2021 CLINICAL DATA:  Respiratory failure. EXAM: PORTABLE CHEST 1 VIEW COMPARISON:  04/01/2021.  CT 03/27/2021. FINDINGS: Mediastinum and hilar  structures are stable. Borderline cardiomegaly. No pulmonary venous congestion. Persistent large mass right upper lung again noted. Diffuse bilateral interstitial prominence again noted. Although interstitial edema and/or pneumonitis could present this fashion, lymphangitic tumor spread again cannot be excluded. Bibasilar atelectasis. Right base infiltrate. Right base pneumonia cannot be excluded. Small bilateral pleural effusions again noted. No acute bony abnormality identified. IMPRESSION: 1.  Large right upper lung mass again noted without interim change. 2. Diffuse bilateral interstitial prominence again noted. Although interstitial edema and/or pneumonitis could present in this fashion, lymphangitic tumor spread again cannot be excluded. 3. Bibasilar atelectasis. Right base infiltrate. Right base pneumonia cannot be excluded. Small bilateral pleural effusions again noted. 4.  Borderline cardiomegaly. Electronically Signed   By: Marcello Moores  Register   On: 04/04/2021 07:53   DG Chest Port 1 View  Result Date: 04/01/2021 CLINICAL DATA:  Shortness of breath EXAM: PORTABLE CHEST 1 VIEW COMPARISON:  CT 03/27/2021, radiograph 03/30/2021 FINDINGS: Diminishing lung volumes with increasing veiling opacity in the lung bases, right greater than left which could reflect developing pleural effusions. Pulmonary vascularity is indistinct. Cardiomediastinal silhouette is stable from prior with known large pericardial effusion. Redemonstration of a large rounded masslike opacity in the right upper lung corresponding to the known right upper lung malignancy. No acute or worrisome osseous or soft tissue abnormality of the chest wall. IMPRESSION: Diminishing lung volumes. Increasing veiling opacity may reflect developing pleural effusions. Additional opacity in the bases may reflect some passive atelectatic change or underlying airspace opacity. Diffusely worsening interstitial opacity, concerning for developing edema as well  given pulmonary vascular congestion. Underlying lymphangitic spread is not fully excluded. Redemonstrated masslike opacity in the right upper lobe. Electronically Signed   By: Lovena Le M.D.   On: 04/01/2021 21:16   DG Chest Port 1 View  Result Date: 03/29/2021 CLINICAL DATA:  Shortness of breath and chest pain EXAM: PORTABLE CHEST 1 VIEW COMPARISON:  03/27/2021 FINDINGS: Cardiac shadow is mildly enlarged but stable. Spiculated right upper lobe mass lesion is again noted. Mild increase in parenchymal edema is seen. Small bore catheter is again noted over the cardiac shadow likely related to a pericardial drain. No pneumothorax is noted. IMPRESSION: Increasing edema particularly on the right. Stable right upper lobe mass lesion. No new focal abnormality is noted. Electronically Signed   By: Inez Catalina M.D.   On: 03/29/2021 09:02   DG Chest Port 1 View  Result Date: 03/27/2021 CLINICAL DATA:  Cardiac tamponade EXAM: PORTABLE CHEST 1 VIEW COMPARISON:  Chest x-ray 03/27/2021,  CT chest 03/27/2021 FINDINGS: Right upper lobe lung mass redemonstrated. Emphysema with bronchitic changes. Cardiomegaly. Interim placement of drainage catheter over the heart presumably a pericardial drainage catheter. Cardiac silhouette decreased compared to prior. Slight increased interstitial opacity possibly superimposed edema. No pneumothorax IMPRESSION: 1. Placement of presumed pericardial drainage catheter with decreased cardiomegaly 2. Emphysema with chronic bronchitic change, slight increased interstitial opacity may reflect superimposed mild edema 3. Redemonstrated spiculated lung mass at the right apex Electronically Signed   By: Donavan Foil M.D.   On: 03/27/2021 22:28   ECHOCARDIOGRAM COMPLETE  Result Date: 03/28/2021    ECHOCARDIOGRAM REPORT   Patient Name:   TORIANNA JUNIO Date of Exam: 03/28/2021 Medical Rec #:  [id number]   Height:       61.5 in Accession #:    [id number]  Weight:       128.7 lb Date of Birth:   1961/02/18  BSA:          1.576 m Patient Age:    73 years    BP:           128/88 mmHg Patient Gender: F           HR:           105 bpm. Exam Location:  [facility] Procedure: 2D Echo, Color Doppler, Cardiac Doppler and Intracardiac            Opacification Agent Indications:     I31.4 Tamponade  History:         Patient has no prior history of Echocardiogram examinations.                  Signs/Symptoms:Shortness of Breath; Risk Factors:Current                  Smoker. Graves disease.  Sonographer:     Charmayne Sheer RDCS (AE) Referring Phys:  [id number] CHRISTOPHER END Diagnosing Phys: Kate Sable MD  Sonographer Comments: Technically difficult study due to poor echo windows. IMPRESSIONS  1. Left ventricular ejection fraction, by estimation, is 55%. The left ventricle has normal function. The left ventricle has no regional wall motion abnormalities. Left ventricular diastolic parameters are consistent with Grade II diastolic dysfunction (pseudonormalization).  2. Right ventricular systolic function is normal. The right ventricular size is normal.  3. The mitral valve is normal in structure. No evidence of mitral valve regurgitation.  4. The aortic valve was not well visualized. Aortic valve regurgitation is not visualized.  5. The inferior vena cava is dilated in size with >50% respiratory variability, suggesting right atrial pressure of 8 mmHg. FINDINGS  Left Ventricle: Left ventricular ejection fraction, by estimation, is 55%. The left ventricle has normal function. The left ventricle has no regional wall motion abnormalities. Definity contrast agent was given IV to delineate the left ventricular endocardial Loreto Loescher. The left ventricular internal cavity size was normal in size. There is no left ventricular hypertrophy. Left ventricular diastolic parameters are consistent with Grade II diastolic dysfunction (pseudonormalization). Right Ventricle: The right ventricular size is normal. No increase in right ventricular wall  thickness. Right ventricular systolic function is normal. Left Atrium: Left atrial size was normal in size. Right Atrium: Right atrial size was normal in size. Pericardium: Trivial pericardial effusion is present. Mitral Valve: The mitral valve is normal in structure. No evidence of mitral valve regurgitation. Tricuspid Valve: The tricuspid valve is not well visualized. Tricuspid valve regurgitation is not demonstrated. Aortic Valve: The aortic valve was not well visualized. Aortic  valve regurgitation is not visualized. Aortic valve mean gradient measures 2.0 mmHg. Aortic valve peak gradient measures 4.7 mmHg. Aortic valve area, by VTI measures 3.44 cm. Pulmonic Valve: The pulmonic valve was not well visualized. Pulmonic valve regurgitation is not visualized. Aorta: The aortic root is normal in size and structure. Venous: The inferior vena cava is dilated in size with greater than 50% respiratory variability, suggesting right atrial pressure of 8 mmHg. IAS/Shunts: No atrial level shunt detected by color flow Doppler.  LEFT VENTRICLE PLAX 2D LVIDd:         3.90 cm  Diastology LVIDs:         3.30 cm  LV e' medial:    9.46 cm/s LV PW:         1.20 cm  LV E/e' medial:  8.9 LV IVS:        0.90 cm  LV e' lateral:   4.57 cm/s LVOT diam:     2.20 cm  LV E/e' lateral: 18.4 LV SV:         54 LV SV Index:   34 LVOT Area:     3.80 cm  LEFT ATRIUM           Index LA diam:      3.70 cm 2.35 cm/m LA Vol (A4C): 38.0 ml 24.11 ml/m  AORTIC VALVE                   PULMONIC VALVE AV Area (Vmax):    3.40 cm    PV Vmax:       0.97 m/s AV Area (Vmean):   3.66 cm    PV Vmean:      65.700 cm/s AV Area (VTI):     3.44 cm    PV VTI:        0.122 m AV Vmax:           108.00 cm/s PV Peak grad:  3.7 mmHg AV Vmean:          71.900 cm/s PV Mean grad:  2.0 mmHg AV VTI:            0.156 m AV Peak Grad:      4.7 mmHg AV Mean Grad:      2.0 mmHg LVOT Vmax:         96.60 cm/s LVOT Vmean:        69.300 cm/s LVOT VTI:          0.141 m LVOT/AV VTI  ratio: 0.90  AORTA Ao Root diam: 2.80 cm MITRAL VALVE MV Area (PHT): 5.07 cm    SHUNTS MV Decel Time: 150 msec    Systemic VTI:  0.14 m MV E velocity: 84.23 cm/s  Systemic Diam: 2.20 cm MV A velocity: 88.23 cm/s MV E/A ratio:  0.95 Kate Sable MD Electronically signed by Kate Sable MD Signature Date/Time: 03/28/2021/1:51:00 PM    Final    ECHOCARDIOGRAM LIMITED  Result Date: 04/02/2021    ECHOCARDIOGRAM LIMITED REPORT   Patient Name:   LESSIE FUNDERBURKE Date of Exam: 04/02/2021 Medical Rec #:  [id number]   Height:       61.5 in Accession #:    [id number]  Weight:       137.6 lb Date of Birth:  07/07/1961  BSA:          1.621 m Patient Age:    60 years    BP:           106/77 mmHg Patient  Gender: F           HR:           87 bpm. Exam Location:  [facility] Procedure: Limited Echo, Color Doppler and Cardiac Doppler Indications:     Pericardial Effusion I31.3  History:         Patient has prior history of Echocardiogram examinations, most                  recent 03/29/2021. COPD; Risk Factors:Hypertension.  Sonographer:     Sherrie Sport RDCS (AE) Referring Phys:  [id number] [place] Diagnosing Phys: Harrell Gave End MD  Sonographer Comments: Image acquisition challenging due to COPD. IMPRESSIONS  1. Left ventricular ejection fraction, by estimation, is >55%. The left ventricle has normal function.  2. Right ventricular systolic function is normal. Moderately increased right ventricular wall thickness.  3. Echogenic material in the pericardial space is noted, which could represent epicardial fat. However, in the setting of malignant pericardial effusion, tumor cannot be excluded. FINDINGS  Left Ventricle: Left ventricular ejection fraction, by estimation, is >55%. The left ventricle has normal function. The left ventricular internal cavity size was normal in size. There is borderline left ventricular hypertrophy. Right Ventricle: Moderately increased right ventricular wall thickness. Right ventricular systolic  function is normal. Pericardium: Echogenic material in the pericardial space is noted, which could represent epicardial fat. However, in the setting of malignant pericardial effusion, tumor cannot be excluded. Trivial pericardial effusion is present. LEFT VENTRICLE PLAX 2D LVIDd:         4.23 cm LVIDs:         2.58 cm LV PW:         1.01 cm LV IVS:        1.01 cm LVOT diam:     2.00 cm LVOT Area:     3.14 cm  LEFT ATRIUM         Index LA diam:    3.60 cm 2.22 cm/m   AORTA Ao Root diam: 2.90 cm  SHUNTS Systemic Diam: 2.00 cm Nelva Bush MD Electronically signed by Nelva Bush MD Signature Date/Time: 04/02/2021/12:36:25 PM    Final    ECHOCARDIOGRAM LIMITED  Result Date: 03/29/2021    ECHOCARDIOGRAM LIMITED REPORT   Patient Name:   RAYLIN DIGUGLIELMO Date of Exam: 03/29/2021 Medical Rec #:  [id number]   Height:       61.5 in Accession #:    [id number]  Weight:       136.5 lb Date of Birth:  08/10/1961  BSA:          1.615 m Patient Age:    37 years    BP:           137/91 mmHg Patient Gender: F           HR:           116 bpm. Exam Location:  [facility] Procedure: Cardiac Doppler, Color Doppler and Limited Echo Indications:     Pericardial Effusion I31.3  History:         Patient has prior history of Echocardiogram examinations, most                  recent 03/28/2021. Risk Factors:Hypertension. Cardiac tamponade.  Sonographer:     Sherrie Sport RDCS (AE) Referring Phys:  [id number] [place] Diagnosing Phys: Ida Rogue MD  Sonographer Comments: Technically difficult study due to poor echo windows. IMPRESSIONS  1. Trvial pericardial effusion.  2.  Left ventricular ejection fraction, by estimation, is 60 to 65%. The left ventricle has normal function. The left ventricle has no regional wall motion abnormalities.  3. Right ventricular systolic function is normal. The right ventricular size is normal.  4. The mitral valve is normal in structure. No evidence of mitral valve regurgitation. No evidence of mitral stenosis.  FINDINGS  Left Ventricle: Left ventricular ejection fraction, by estimation, is 60 to 65%. The left ventricle has normal function. The left ventricle has no regional wall motion abnormalities. The left ventricular internal cavity size was normal in size. There is  no left ventricular hypertrophy. Right Ventricle: The right ventricular size is normal. No increase in right ventricular wall thickness. Right ventricular systolic function is normal. Left Atrium: Left atrial size was normal in size. Right Atrium: Right atrial size was normal in size. Pericardium: Trivial pericardial effusion is present. Mitral Valve: The mitral valve is normal in structure. No evidence of mitral valve stenosis. Tricuspid Valve: The tricuspid valve is normal in structure. Tricuspid valve regurgitation is not demonstrated. No evidence of tricuspid stenosis. Aortic Valve: The aortic valve was not well visualized. Aortic valve regurgitation is not visualized. No aortic stenosis is present. Pulmonic Valve: The pulmonic valve was normal in structure. Pulmonic valve regurgitation is not visualized. No evidence of pulmonic stenosis. Aorta: The aortic root is normal in size and structure. Venous: The inferior vena cava is normal in size with greater than 50% respiratory variability, suggesting right atrial pressure of 3 mmHg. IAS/Shunts: No atrial level shunt detected by color flow Doppler. LEFT VENTRICLE PLAX 2D LVIDd:         4.04 cm LVIDs:         2.55 cm LV PW:         1.31 cm LV IVS:        0.93 cm LVOT diam:     2.00 cm LVOT Area:     3.14 cm  LEFT ATRIUM           Index      RIGHT ATRIUM           Index LA diam:      3.10 cm 1.92 cm/m RA Area:     10.20 cm LA Vol (A4C): 11.2 ml 6.93 ml/m RA Volume:   23.00 ml  14.24 ml/m   AORTA Ao Root diam: 2.80 cm TRICUSPID VALVE TR Peak grad:   4.8 mmHg TR Vmax:        110.00 cm/s  SHUNTS Systemic Diam: 2.00 cm Ida Rogue MD Electronically signed by Ida Rogue MD Signature Date/Time:  03/29/2021/3:32:05 PM    Final     PERFORMANCE STATUS (ECOG) : 2 - Symptomatic, <50% confined to bed  Review of Systems Unless otherwise noted, a complete review of systems is negative.  Physical Exam General: NAD HEENT: Thrush on tongue, unable to visualize distal OP Cardiovascular: regular rate and rhythm Pulmonary: clear ant fields Abdomen: soft, nontender, + bowel sounds GU: no suprapubic tenderness Extremities: no edema, no joint deformities Skin: Macular rash to chest and back.  See images Neurological: Weakness but otherwise nonfocal       Assessment and Plan- Patient is a 60 y.o. female with multiple medical problems including stage IV adenocarcinoma of the lung, Graves' disease, and A. fib/RVR, who presents to [facility] for evaluation and management of persistent oral candidiasis   Thrush -we will discontinue nystatin and start oral fluconazole x2 weeks.  We will also start on viscous lidocaine for  oral discomfort.  Note that fluconazole has possible interaction with amiodarone potentially causing QTC prolongation.  Last EKG was in 8/3 with QTC measured at 446 ms.  Discussed with Dr. Rogue Bussing, pharmacy, and Dr. Saunders Revel with cardiology.  We will proceed with fluconazole but plan on rechecking EKG next week.  Patient will see Dr. Saunders Revel with cardiology tomorrow.  Rash -unclear if this is a drug rash but patient does have history of similar response to Tapazole and is now on PTU.  Discussed with Dr. Rogue Bussing and will hold PTU and plan to recheck thyroid panel next week.  Case and plan discussed with Dr. Rogue Bussing  Patient expressed understanding and was in agreement with this plan. She also understands that She can call clinic at any time with any questions, concerns, or complaints.   Thank you for allowing me to participate in the care of this very pleasant patient.   Time Total: 30 minutes  Visit consisted of counseling and education dealing with the complex and emotionally  intense issues of symptom management and palliative care in the setting of serious and potentially life-threatening illness.Greater than 50%  of this time was spent counseling and coordinating care related to the above assessment and plan.  Signed by: Altha Harm, PhD, NP-C

## 2021-04-17 NOTE — Progress Notes (Signed)
[facility] [place] Work  Initial Assessment   Nichole Park is a 60 y.o. year old female contacted by phone. Clinical Social Work was referred by medical oncology for assessment of psychosocial needs. CSW spoke with patient initially and then followed up by phone with daughter, Nichole Park. Patient's daughter shared she is helping her with paperwork and coordination of programs.  SDOH (Social Determinants of Health) assessments performed: Yes   Distress Screen completed: Yes ONCBCN DISTRESS SCREENING 04/12/2021  Screening Type Initial Screening  Distress experienced in past week (1-10) 7  Practical problem type Insurance;Housing  Emotional problem type Adjusting to illness  Information Concerns Type Lack of info about treatment  Physician notified of physical symptoms Yes  Referral to clinical social work Yes  Referral to financial advocate Yes      Family/Social Information:  Housing Arrangement: patient lives with long time significant other Family members/support persons in your life? Patient identified Significant other and daughter Nichole Park as primary support system Transportation concerns: no, may be a concern in the future Employment: Unemployed. 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 yes, although patient and CSW briefly discussed how much information is received and how diagnosis/treatment can be overwhelming to process Concerns about diagnosis and/or treatment:  Feelings of anger or sadness, Overwhelmed by information, and Relationship with husband or partner Patient reported stressors: Insurance underwriter, Publishing rights manager, Transport planner, Haematologist, and Adjusting to my illness Hopes and priorities: Did not discuss during phone visit with patient Patient enjoys  unknown at this time Current coping skills/ strengths: Other: CSW will explore positive coping skills with patient at future visits    SUMMARY: Current SDOH Barriers:  Financial constraints related to paying essential dailiy living expenses, Limited social support, Transportation, Limited access to food, Medication procurement, and Family and relationship dysfunction  Clinical Social Work Clinical Goal(s):  patient will work with SW to address concerns related to resource needs and emotional health  Interventions: Discussed common feeling and emotions when being diagnosed with cancer, and the importance of support during treatment Informed patient of the support team roles and support services at [facility] Provided CSW contact information and encouraged patient to call with any questions or concerns Referred patient to Ulice Dash regarding [employer] financial assistance grant Provided education on disability approval process through [employer] Provided patient's daughter with food program information to receive Naval architect at [employer] and [employer] card (available 1x month)   Follow Up Plan: CSW will follow-up with patient by phone to further process emotional reactions to cancer diagnosis. Patient verbalizes understanding of plan: Yes    Kennith Center , LCSW

## 2021-04-17 NOTE — Addendum Note (Signed)
Addended by: Gloris Ham on: 04/17/2021 08:30 AM   Modules accepted: Orders

## 2021-04-17 NOTE — Progress Notes (Signed)
[employer] order placed via online portal. Applied for financial assistance on pt's behalf due to self-pay. Spoke with pt's daughter, Summer, and made aware.

## 2021-04-17 NOTE — Telephone Encounter (Signed)
All her appts are in for her. Can you call im in [place] and rather busy today

## 2021-04-18 ENCOUNTER — Encounter: Payer: Self-pay | Admitting: Internal Medicine

## 2021-04-18 ENCOUNTER — Other Ambulatory Visit: Payer: Self-pay

## 2021-04-18 ENCOUNTER — Ambulatory Visit (INDEPENDENT_AMBULATORY_CARE_PROVIDER_SITE_OTHER): Payer: Self-pay | Admitting: Internal Medicine

## 2021-04-18 VITALS — BP 114/70 | HR 109 | Ht 61.0 in | Wt 125.0 lb

## 2021-04-18 DIAGNOSIS — I159 Secondary hypertension, unspecified: Secondary | ICD-10-CM

## 2021-04-18 DIAGNOSIS — I480 Paroxysmal atrial fibrillation: Secondary | ICD-10-CM

## 2021-04-18 MED ORDER — AMIODARONE HCL 200 MG PO TABS
200.0000 mg | ORAL_TABLET | Freq: Every day | ORAL | 3 refills | Status: DC
  Filled 2021-04-18 – 2021-04-27 (×2): qty 90, 90d supply, fill #0

## 2021-04-18 MED ORDER — FUROSEMIDE 40 MG PO TABS
40.0000 mg | ORAL_TABLET | Freq: Every day | ORAL | 5 refills | Status: DC | PRN
  Filled 2021-04-18: qty 30, 30d supply, fill #0

## 2021-04-18 MED ORDER — DILTIAZEM HCL ER 240 MG PO CP24
240.0000 mg | ORAL_CAPSULE | Freq: Every day | ORAL | 3 refills | Status: DC
  Filled 2021-04-18: qty 90, 90d supply, fill #0
  Filled 2021-04-27: qty 70, 70d supply, fill #0
  Filled 2021-07-05: qty 90, 90d supply, fill #1

## 2021-04-18 NOTE — Patient Instructions (Signed)
Medication Instructions:   Your physician has recommended you make the following change in your medication:   DECREASE Amiodarone 200 mg DAILY  CHANGE Furosemide (Lasix) 40 mg daily AS NEEDED for swelling / weight gain  *If you need a refill on your cardiac medications before your next appointment, please call your pharmacy*   Lab Work:  None ordered  Testing/Procedures:  None ordered   Follow-Up: At [facility], you and your health needs are our priority.  As part of our continuing mission to provide you with exceptional heart care, we have created designated Provider Care Teams.  These Care Teams include your primary Cardiologist (physician) and Advanced Practice Providers (APPs -  Physician Assistants and Nurse Practitioners) who all work together to provide you with the care you need, when you need it.  We recommend signing up for the patient portal called "MyChart".  Sign up information is provided on this After Visit Summary.  MyChart is used to connect with patients for Virtual Visits (Telemedicine).  Patients are able to view lab/test results, encounter notes, upcoming appointments, etc.  Non-urgent messages can be sent to your provider as well.   To learn more about what you can do with MyChart, go to [email or web address].    Your next appointment:   4 - 6 week(s)  The format for your next appointment:   In Person  Provider:   You may see Nelva Bush, MD or one of the following Advanced Practice Providers on your designated Care Team:   Murray Hodgkins, NP Christell Faith, PA-C Marrianne Mood, PA-C Cadence Tracy, [place]

## 2021-04-18 NOTE — Progress Notes (Signed)
The following Medication: Beryle Flock has been approved thru [employer] as Risk manager. Enrollment period is 04/18/2021 to 04/18/22.  Assistance ID: [id number]. Reason for Assistance: Self First DOS: 04/25/2021  Madalyn Rob, CPhT IV Drug Replacement Specialist  [place] Phone: [phone]

## 2021-04-18 NOTE — Progress Notes (Signed)
Follow-up Outpatient Visit Date: 04/18/2021  Primary Care Provider: Pcp, No No address on file  Chief Complaint: Follow-up recent hospitalization  HPI:  [name] is a 60 y.o. female with history of recently diagnosed lung adenocarcinoma complicated by malignant pericardial effusion requiring emergent pericardiocentesis in the setting of tamponade and paroxysmal atrial fibrillation, who presents for follow-up of pericardial effusion and paroxysmal atrial fibrillation.  Since being discharged from the hospital last month, [name] has continued to have shortness of breath.  It seemed a little worse today being outside in the heat.  She gets out of breath with mild activity.  She denies chest pain.  Her heart rate has been elevated at times.  She has not been lightheaded.  She denies edema.  If anything, she is concerned that she may be dehydrated, remaining on furosemide.  --------------------------------------------------------------------------------------------------  Past Medical History:  Diagnosis Date   Graves disease    Malignant pericardial effusion (HCC)    Paroxysmal atrial fibrillation ([place])    Primary lung adenocarcinoma [facility])    Past Surgical History:  Procedure Laterality Date   PERICARDIOCENTESIS N/A 03/27/2021   Procedure: PERICARDIOCENTESIS;  Surgeon: Nelva Bush, MD;  Location: [facility];  Service: Cardiovascular;  Laterality: N/A;   TUBAL LIGATION       Recent CV Pertinent Labs: Lab Results  Component Value Date   INR 1.1 03/27/2021   BNP 382.5 (H) 04/03/2021   K 4.0 04/12/2021   K 3.7 12/01/2013   MG 2.7 (H) 04/09/2021   BUN 11 04/12/2021   BUN 8 12/01/2013   CREATININE 0.56 04/12/2021   CREATININE 0.43 (L) 12/01/2013    Past medical and surgical history were reviewed and updated in EPIC.  Current Meds  Medication Sig   albuterol (VENTOLIN HFA) 108 (90 Base) MCG/ACT inhaler Inhale 2 puffs into the lungs every 6 (six) hours as needed  for wheezing or shortness of breath.   dexamethasone (DECADRON) 4 MG tablet Take 1 tablet by mouth in the morning and evening 2 days. Take the day prior and the day after chemo. DO NOT take on the day of chemo.   fluconazole (DIFLUCAN) 100 MG tablet Take 2 tablets (200mg  total) by mouth on Day 1. Then take 1 tablet (100mg ) by mouth once daily thereafter.   folic acid (FOLVITE) 1 MG tablet Take 1 tablet (1 mg total) by mouth once daily.   lidocaine (XYLOCAINE) 2 % solution Use as directed. Swish and spit 15 mLs in the mouth or throat once every 6 (six) hours as needed for mouth pain.   Multiple Vitamin (MULTI-VITAMINS) TABS Take 1 tablet by mouth daily.   nystatin (MYCOSTATIN) 100000 UNIT/ML suspension Take 5 mLs (500,000 Units total) by mouth 4 (four) times daily.   ondansetron (ZOFRAN) 4 MG tablet Take 2 tablets (8mg  total) by mouth once every 8 hours as needed for nausea and vomiting.   prochlorperazine (COMPAZINE) 10 MG tablet Take 1 tablet (10 mg total) by mouth once every 6 (six) hours as needed for nausea or vomiting.   [DISCONTINUED] amiodarone (PACERONE) 200 MG tablet Take 1 tablet (200 mg total) by mouth 2 (two) times daily.   [DISCONTINUED] diltiazem (DILACOR XR) 240 MG 24 hr capsule Take 1 capsule (240 mg total) by mouth once daily.   [DISCONTINUED] furosemide (LASIX) 40 MG tablet Take 1 tablet (40 mg total) by mouth once daily.    Allergies: Diazepam, Metoprolol, and Tapazole [methimazole]  Social History   Tobacco Use  Smoking status: Former    Packs/day: 1.00    Years: 40.00    Pack years: 40.00    Types: Cigarettes   Smokeless tobacco: Never  Vaping Use   Vaping Use: Never used  Substance Use Topics   Alcohol use: No   Drug use: Not Currently    Types: Marijuana    Family History  Problem Relation Age of Onset   Peripheral Artery Disease Mother     Review of Systems: A 12-system review of systems was performed and was negative except as noted in the  HPI.  --------------------------------------------------------------------------------------------------  Physical Exam: BP 114/70 (BP Location: Right Arm, Patient Position: Sitting, Cuff Size: Normal)   Pulse (!) 109   Ht 5\' 1"  (1.549 m)   Wt 125 lb (56.7 kg)   SpO2 92%   BMI 23.62 kg/m   General:  NAD. Neck: No JVD or HJR. Lungs: Clear to auscultation bilaterally without wheezes or crackles. Heart: Tachycardic but regular without murmurs, rubs, or gallops. Abdomen: Soft, nontender, nondistended. Extremities: No lower extremity edema.  Limited bedside echo (personally performed) shows no significant pericardial effusion.  Echogenic material in the pericardial space most likely represents epicardial fat though other soft tissue (including tumor) cannot be excluded.  LVEF is grossly normal.  EKG: Sinus tachycardia with biatrial enlargement and bifascicular block (LPF B and RBBB).  Nonspecific T wave abnormality.  Lab Results  Component Value Date   WBC 18.2 (H) 04/12/2021   HGB 14.4 04/12/2021   HCT 43.4 04/12/2021   MCV 87.0 04/12/2021   PLT 361 04/12/2021    Lab Results  Component Value Date   NA 127 (L) 04/12/2021   K 4.0 04/12/2021   CL 93 (L) 04/12/2021   CO2 24 04/12/2021   BUN 11 04/12/2021   CREATININE 0.56 04/12/2021   GLUCOSE 111 (H) 04/12/2021   ALT 34 04/12/2021    No results found for: CHOL, HDL, LDLCALC, LDLDIRECT, TRIG, CHOLHDL  --------------------------------------------------------------------------------------------------  ASSESSMENT AND PLAN: Lung adenocarcinoma and malignant pericardial effusion: [name] has significant dyspnea with minimal activity, witnessed today when moving from wheelchair to the exam table in the clinic.  Fortunately, bedside echo shows no significant reaccumulation of pericardial effusion.  I suspect that her lung cancer and generalized deconditioning are contributing to her dyspnea.  We will discontinue standing  furosemide, as [name] appears dry on exam today; she can use this on an as-needed basis for weight gain/edema.  Defer ongoing oncologic management to Dr. Rogue Bussing.  Paroxysmal atrial fibrillation: EKG today demonstrates sinus tachycardia.  We have agreed to decrease amiodarone to 200 mg daily.  Continue diltiazem 240 mg daily.  Follow-up: Return to clinic in 4-6 weeks.  Nelva Bush, MD 04/18/2021 9:48 PM

## 2021-04-19 ENCOUNTER — Other Ambulatory Visit: Payer: Self-pay

## 2021-04-23 ENCOUNTER — Other Ambulatory Visit: Payer: Self-pay

## 2021-04-24 ENCOUNTER — Ambulatory Visit: Payer: Self-pay

## 2021-04-24 ENCOUNTER — Ambulatory Visit
Admission: RE | Admit: 2021-04-24 | Discharge: 2021-04-24 | Disposition: A | Discharge status: Home / Self Care | Payer: Medicaid Other | Source: Ambulatory Visit | Attending: Internal Medicine | Admitting: Internal Medicine

## 2021-04-24 ENCOUNTER — Other Ambulatory Visit: Payer: Self-pay

## 2021-04-24 DIAGNOSIS — C3411 Malignant neoplasm of upper lobe, right bronchus or lung: Secondary | ICD-10-CM | POA: Diagnosis not present

## 2021-04-24 MED ORDER — GADOBUTROL 1 MMOL/ML IV SOLN
5.0000 mL | Freq: Once | INTRAVENOUS | Status: AC | PRN
  Administered 2021-04-24: 5 mL via INTRAVENOUS

## 2021-04-25 ENCOUNTER — Encounter: Payer: Self-pay | Admitting: Internal Medicine

## 2021-04-25 ENCOUNTER — Inpatient Hospital Stay (HOSPITAL_BASED_OUTPATIENT_CLINIC_OR_DEPARTMENT_OTHER): Payer: Medicaid Other | Admitting: Internal Medicine

## 2021-04-25 ENCOUNTER — Inpatient Hospital Stay: Payer: Medicaid Other

## 2021-04-25 ENCOUNTER — Other Ambulatory Visit: Payer: Self-pay

## 2021-04-25 ENCOUNTER — Encounter: Payer: Self-pay | Admitting: *Deleted

## 2021-04-25 ENCOUNTER — Other Ambulatory Visit: Payer: Self-pay | Admitting: Radiology

## 2021-04-25 ENCOUNTER — Other Ambulatory Visit: Payer: Self-pay | Admitting: Internal Medicine

## 2021-04-25 VITALS — HR 99

## 2021-04-25 DIAGNOSIS — C3411 Malignant neoplasm of upper lobe, right bronchus or lung: Secondary | ICD-10-CM

## 2021-04-25 DIAGNOSIS — Z5112 Encounter for antineoplastic immunotherapy: Secondary | ICD-10-CM | POA: Diagnosis not present

## 2021-04-25 DIAGNOSIS — I3131 Malignant pericardial effusion in diseases classified elsewhere: Secondary | ICD-10-CM

## 2021-04-25 DIAGNOSIS — I313 Pericardial effusion (noninflammatory): Secondary | ICD-10-CM

## 2021-04-25 LAB — CBC WITH DIFFERENTIAL/PLATELET
Abs Immature Granulocytes: 0.17 10*3/uL — ABNORMAL HIGH (ref 0.00–0.07)
Basophils Absolute: 0 10*3/uL (ref 0.0–0.1)
Basophils Relative: 0 %
Eosinophils Absolute: 0 10*3/uL (ref 0.0–0.5)
Eosinophils Relative: 0 %
HCT: 41.6 % (ref 36.0–46.0)
Hemoglobin: 13.4 g/dL (ref 12.0–15.0)
Immature Granulocytes: 1 %
Lymphocytes Relative: 8 %
Lymphs Abs: 1.5 10*3/uL (ref 0.7–4.0)
MCH: 28.2 pg (ref 26.0–34.0)
MCHC: 32.2 g/dL (ref 30.0–36.0)
MCV: 87.4 fL (ref 80.0–100.0)
Monocytes Absolute: 0.2 10*3/uL (ref 0.1–1.0)
Monocytes Relative: 1 %
Neutro Abs: 17.7 10*3/uL — ABNORMAL HIGH (ref 1.7–7.7)
Neutrophils Relative %: 90 %
Platelets: 581 10*3/uL — ABNORMAL HIGH (ref 150–400)
RBC: 4.76 MIL/uL (ref 3.87–5.11)
RDW: 13.3 % (ref 11.5–15.5)
WBC: 19.6 10*3/uL — ABNORMAL HIGH (ref 4.0–10.5)
nRBC: 0 % (ref 0.0–0.2)

## 2021-04-25 LAB — COMPREHENSIVE METABOLIC PANEL
ALT: 32 U/L (ref 0–44)
AST: 35 U/L (ref 15–41)
Albumin: 2.6 g/dL — ABNORMAL LOW (ref 3.5–5.0)
Alkaline Phosphatase: 93 U/L (ref 38–126)
Anion gap: 13 (ref 5–15)
BUN: 11 mg/dL (ref 6–20)
CO2: 23 mmol/L (ref 22–32)
Calcium: 9.4 mg/dL (ref 8.9–10.3)
Chloride: 100 mmol/L (ref 98–111)
Creatinine, Ser: 0.64 mg/dL (ref 0.44–1.00)
GFR, Estimated: >60 mL/min (ref >60)
Glucose, Bld: 299 mg/dL — ABNORMAL HIGH (ref 70–99)
Potassium: 4.8 mmol/L (ref 3.5–5.1)
Sodium: 136 mmol/L (ref 135–145)
Total Bilirubin: 0.5 mg/dL (ref 0.3–1.2)
Total Protein: 7.3 g/dL (ref 6.5–8.1)

## 2021-04-25 LAB — TSH: TSH: 3.212 u[IU]/mL (ref 0.350–4.500)

## 2021-04-25 MED ORDER — SODIUM CHLORIDE 0.9 % IV SOLN
150.0000 mg | Freq: Once | INTRAVENOUS | Status: AC
  Administered 2021-04-25: 150 mg via INTRAVENOUS
  Filled 2021-04-25: qty 5

## 2021-04-25 MED ORDER — CYANOCOBALAMIN 1000 MCG/ML IJ SOLN
1000.0000 ug | Freq: Once | INTRAMUSCULAR | Status: AC
  Administered 2021-04-25: 1000 ug via INTRAMUSCULAR
  Filled 2021-04-25: qty 1

## 2021-04-25 MED ORDER — SODIUM CHLORIDE 0.9 % IV SOLN
500.0000 mg/m2 | Freq: Once | INTRAVENOUS | Status: AC
  Administered 2021-04-25: 800 mg via INTRAVENOUS
  Filled 2021-04-25: qty 20

## 2021-04-25 MED ORDER — PALONOSETRON HCL INJECTION 0.25 MG/5ML
0.2500 mg | Freq: Once | INTRAVENOUS | Status: AC
  Administered 2021-04-25: 0.25 mg via INTRAVENOUS
  Filled 2021-04-25: qty 5

## 2021-04-25 MED ORDER — SODIUM CHLORIDE 0.9 % IV SOLN
10.0000 mg | Freq: Once | INTRAVENOUS | Status: AC
  Administered 2021-04-25: 10 mg via INTRAVENOUS
  Filled 2021-04-25: qty 10

## 2021-04-25 MED ORDER — SODIUM CHLORIDE 0.9 % IV SOLN
200.0000 mg | Freq: Once | INTRAVENOUS | Status: AC
  Administered 2021-04-25: 200 mg via INTRAVENOUS
  Filled 2021-04-25: qty 8

## 2021-04-25 MED ORDER — SODIUM CHLORIDE 0.9 % IV SOLN
465.0000 mg | Freq: Once | INTRAVENOUS | Status: AC
  Administered 2021-04-25: 470 mg via INTRAVENOUS
  Filled 2021-04-25: qty 47

## 2021-04-25 MED ORDER — SODIUM CHLORIDE 0.9 % IV SOLN
Freq: Once | INTRAVENOUS | Status: AC
  Filled 2021-04-25: qty 250

## 2021-04-25 NOTE — Progress Notes (Signed)
Daughter inquiring if ODT zofran could be called into pt's pharmacy.

## 2021-04-25 NOTE — Progress Notes (Signed)
Per MD, Dr. Rogue Bussing, order: release order for Vitamin B-12 injection from pre-treatment plan and administer in clinic today.

## 2021-04-25 NOTE — Assessment & Plan Note (Addendum)
#  Stage IV-lung cancer-adenocarcinoma. I reviewed the stage as stage IV/pathology as non-small cell/adenocarcinoma with patient.  MRI brain pending-  # Recommend carboplatin and Alimta-keytruda  Chemo-immunotherapy every 3 weeks palliative basis.  Again reviewed the potential side effects including but not limited to nausea vomiting diarrhea anemia related side effects.  # Malignant pericardial tamponade-s/p pericardiocentesis;-secondary to lung adenocarcinoma-repeat AUG 2022-2D echo negative for any pericardial effusion.  Monitor closely for symptoms.  #Acute respiratory failure-multifactorial-malignancy/lymphangitic spread/COPD stable-significant provement s/p/bronchodilators/diuretics- STABLE.'  #A. Fib-amiodarone/Cardizem as per cardiology-  STABLE. Not on anti-coagulation [?  Hemorrhagic pericardial effusion]  # PBG- 199-on steroids; [prediabetes]-monitor closely.  # IV access: Recommend port placement.  # DISPOSITION: # referral to [facility] re: malnutrition # treatment today # referral IR re: port palcement- re: IV access # labs- in 10 days-MD;  Labs- cbc/bmp;possible IVFs.  # follow up in 3 weeks- MD; labs- cbc/cmp; carbo-alimta-keytruda-Dr.B  Addendum: -I spoke with patient's daughter regarding the results of the MRI brain that shows multiple subcentimeter brain lesions.  Unclear if this lesions are symptomatic [as per the daughter patient has cognitive impairment/impulsivity mood swings etc. no obvious headaches or vision changes or seizures.].  Discussed whole brain radiation-again potential side effects including long-term cognitive impairment/dementia etc [if patient were to survive long].  Again reviewed with the daughter the median survival is anywhere between 1 to 2 years; although these numbers do not belong to the patient.  Daughter-states that patient would not want radiation [as family member did poorly with radiation in the past].  Discussed option of continued systemic  therapy-and short-term imaging if patient declines radiation as a treatment option.  However, the daughter will speak to patient-and inform us of the discussion.  We will reach out to the patient again tomorrow.

## 2021-04-25 NOTE — Progress Notes (Signed)
Met with patient and daughter during follow up visit prior to starting chemotherapy. All questions answered during visit. Pt and daughter voiced concerns regarding finances and asked if there is any assistance. Pt referred to financial resource team and they will further discuss options for funding assistance. Pt has previously seen Education officer, museum as well. Reviewed upcoming appts with pt and her daughter. Instructed to call with any questions or needs. Pt and her daughter verbalized understanding.

## 2021-04-25 NOTE — Progress Notes (Signed)
Telephone conversation with patient's daughter based upon referral from L-CSW.  Discussed one-time approval of $1000 [place] and qualifications to assist with personal expenses while undergoing treatment.

## 2021-04-25 NOTE — Patient Instructions (Signed)
Port a cath placement scheduled at [facility] 8/19 @11 :30a . Please Arrive @10 :30am. Do not eat or drink anything 6 to 8 hours prior to the procedure. You will need a driver to transport you to and from this apt. Nira Conn, RN

## 2021-04-25 NOTE — Patient Instructions (Signed)
[facility]   Discharge Instructions: Thank you for choosing [facility] to provide your oncology and hematology care.  If you have a lab appointment with the [facility], please go directly to the [facility] and check in at the registration area.  Wear comfortable clothing and clothing appropriate for easy access to any Portacath or PICC line.   We strive to give you quality time with your provider. You may need to reschedule your appointment if you arrive late (15 or more minutes).  Arriving late affects you and other patients whose appointments are after yours.  Also, if you miss three or more appointments without notifying the office, you may be dismissed from the clinic at the provider's discretion.      For prescription refill requests, have your pharmacy contact our office and allow 72 hours for refills to be completed.    Today you received the following chemotherapy and/or immunotherapy agents: Keytruda, Alimta, Carboplatin.      To help prevent nausea and vomiting after your treatment, we encourage you to take your nausea medication as directed.  BELOW ARE SYMPTOMS THAT SHOULD BE REPORTED IMMEDIATELY: *FEVER GREATER THAN 100.4 F (38 C) OR HIGHER *CHILLS OR SWEATING *NAUSEA AND VOMITING THAT IS NOT CONTROLLED WITH YOUR NAUSEA MEDICATION *UNUSUAL SHORTNESS OF BREATH *UNUSUAL BRUISING OR BLEEDING *URINARY PROBLEMS (pain or burning when urinating, or frequent urination) *BOWEL PROBLEMS (unusual diarrhea, constipation, pain near the anus) TENDERNESS IN MOUTH AND THROAT WITH OR WITHOUT PRESENCE OF ULCERS (sore throat, sores in mouth, or a toothache) UNUSUAL RASH, SWELLING OR PAIN  UNUSUAL VAGINAL DISCHARGE OR ITCHING   Items with * indicate a potential emergency and should be followed up as soon as possible or go to the Emergency Department if any problems should occur.  Please show the CHEMOTHERAPY ALERT CARD or IMMUNOTHERAPY  ALERT CARD at check-in to the Emergency Department and triage nurse.  Should you have questions after your visit or need to cancel or reschedule your appointment, please contact [facility]  [phone] and follow the prompts.  Office hours are 8:00 a.m. to 4:30 p.m. Monday - Friday. Please note that voicemails left after 4:00 p.m. may not be returned until the following business day.  We are closed weekends and major holidays. You have access to a nurse at all times for urgent questions. Please call the main number to the clinic [phone] and follow the prompts.  For any non-urgent questions, you may also contact your provider using MyChart. We now offer e-Visits for anyone 79 and older to request care online for non-urgent symptoms. For details visit [email or web address].   Also download the MyChart app! Go to the app store, search "MyChart", open the app, select [facility], and log in with your MyChart username and password.  Due to Covid, a mask is required upon entering the hospital/clinic. If you do not have a mask, one will be given to you upon arrival. For doctor visits, patients may have 1 support person aged 71 or older with them. For treatment visits, patients cannot have anyone with them due to current Covid guidelines and our immunocompromised population.

## 2021-04-26 ENCOUNTER — Telehealth: Payer: Self-pay

## 2021-04-26 ENCOUNTER — Encounter: Payer: Self-pay | Admitting: Internal Medicine

## 2021-04-26 ENCOUNTER — Telehealth: Payer: Self-pay | Admitting: Internal Medicine

## 2021-04-26 ENCOUNTER — Ambulatory Visit
Admission: RE | Admit: 2021-04-26 | Discharge: 2021-04-26 | Disposition: A | Discharge status: Home / Self Care | Payer: Medicaid Other | Source: Ambulatory Visit | Attending: Internal Medicine | Admitting: Internal Medicine

## 2021-04-26 DIAGNOSIS — C3411 Malignant neoplasm of upper lobe, right bronchus or lung: Secondary | ICD-10-CM | POA: Diagnosis not present

## 2021-04-26 DIAGNOSIS — J439 Emphysema, unspecified: Secondary | ICD-10-CM | POA: Diagnosis not present

## 2021-04-26 DIAGNOSIS — K5730 Diverticulosis of large intestine without perforation or abscess without bleeding: Secondary | ICD-10-CM | POA: Insufficient documentation

## 2021-04-26 DIAGNOSIS — I700 Atherosclerosis of aorta: Secondary | ICD-10-CM | POA: Diagnosis not present

## 2021-04-26 DIAGNOSIS — R590 Localized enlarged lymph nodes: Secondary | ICD-10-CM | POA: Diagnosis not present

## 2021-04-26 DIAGNOSIS — J320 Chronic maxillary sinusitis: Secondary | ICD-10-CM | POA: Diagnosis not present

## 2021-04-26 DIAGNOSIS — I517 Cardiomegaly: Secondary | ICD-10-CM | POA: Insufficient documentation

## 2021-04-26 LAB — THYROID PANEL WITH TSH
Free Thyroxine Index: 1.7 (ref 1.2–4.9)
T3 Uptake Ratio: 22 % — ABNORMAL LOW (ref 24–39)
T4, Total: 7.6 ug/dL (ref 4.5–12.0)
TSH: 3.16 u[IU]/mL (ref 0.450–4.500)

## 2021-04-26 LAB — GLUCOSE, CAPILLARY: Glucose-Capillary: 120 mg/dL — ABNORMAL HIGH (ref 70–99)

## 2021-04-26 MED ORDER — FLUDEOXYGLUCOSE F - 18 (FDG) INJECTION
6.5000 | Freq: Once | INTRAVENOUS | Status: AC | PRN
  Administered 2021-04-26: 7.1 via INTRAVENOUS

## 2021-04-26 NOTE — Telephone Encounter (Signed)
on 8/18-I spoke with patient's daughter regarding the results of the MRI brain that shows multiple subcentimeter brain lesions.  Unclear if this lesions are symptomatic [as per the daughter patient has cognitive impairment/impulsivity mood swings etc. no obvious headaches or vision changes or seizures.].  Discussed whole brain radiation-again potential side effects including long-term cognitive impairment/dementia etc [if patient were to survive long].  Again reviewed with the daughter the median survival is anywhere between 1 to 2 years; although these numbers do not belong to the patient.  Daughter-states that patient would not want radiation [as family member did poorly with radiation in the past].  Discussed option of continued systemic therapy-and short-term imaging if patient declines radiation as a treatment option.  However, the daughter will speak to patient-and inform us of the discussion.  We will reach out to the patient again tomorrow.   Hayley-please speak to patient's daughter tomorrow; and update me.  I will reach out to the patient later tomorrow.   Thanks GB

## 2021-04-26 NOTE — Telephone Encounter (Signed)
Telephone call to patient for follow up after receiving first infusion.   Patient states infusion went great.  States eating good and drinking plenty of fluids.   Denies any nausea or vomiting.  Encouraged patient to call for any concerns or questions. 

## 2021-04-26 NOTE — Progress Notes (Signed)
[place] CONSULT NOTE  Patient Care Team: Practice, [facility] (Inactive) as PCP - General End, Harrell Gave, MD as PCP - Cardiology (Cardiology) Telford Nab, RN as Oncology Nurse Navigator  CHIEF COMPLAINTS/PURPOSE OF CONSULTATION: Lung cancer  #  Oncology History Overview Note  # AUG 2022-stage IV adenocarcinoma of the lung [s/p pericardial effusion/tamponade].    # AUG 17th 2022- CARBO-ALIMTA-Keytruda #1  #Acute respiratory failure BiPAP/ICU.  S/p pericardiocentesis    Malignant pericardial effusion (HCC)  03/28/2021 Initial Diagnosis   Malignant pericardial effusion ([place])   04/25/2021 -  Chemotherapy    Patient is on Treatment Plan: LUNG CARBOPLATIN / PEMETREXED / PEMBROLIZUMAB Q21D INDUCTION X 4 CYCLES / MAINTENANCE PEMETREXED + PEMBROLIZUMAB       Cancer of upper lobe of right lung ([place])  04/10/2021 Initial Diagnosis   Cancer of upper lobe of right lung ([place])   04/10/2021 Cancer Staging   Staging form: Lung, AJCC 8th Edition - Clinical: Stage IVA (cT2, cN2, cM1a) - Signed by Cammie Sickle, MD on 04/10/2021      HISTORY OF PRESENTING ILLNESS:  Nichole Park 60 y.o.  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  Patient breathing is improved.  However continues to have shortness of breath on exertion.  Denies any headaches.  Any nausea vomiting.  Her appetite is improving.   Review of Systems  Constitutional:  Positive for malaise/fatigue and weight loss. Negative for chills, diaphoresis and fever.  HENT:  Negative for nosebleeds and sore throat.   Eyes:  Negative for double vision.  Respiratory:  Positive for shortness of breath. Negative for cough, hemoptysis, sputum production and wheezing.   Cardiovascular:  Negative for chest pain, palpitations, orthopnea and leg swelling.  Gastrointestinal:  Negative for abdominal pain, blood in stool, constipation, diarrhea,  heartburn, melena, nausea and vomiting.  Genitourinary:  Negative for dysuria, frequency and urgency.  Musculoskeletal:  Negative for back pain and joint pain.  Skin: Negative.  Negative for itching and rash.  Neurological:  Negative for dizziness, tingling, focal weakness, weakness and headaches.  Endo/Heme/Allergies:  Does not bruise/bleed easily.  Psychiatric/Behavioral:  Negative for depression. The patient is not nervous/anxious and does not have insomnia.     MEDICAL HISTORY:  Past Medical History:  Diagnosis Date  . Graves disease   . Malignant pericardial effusion ([place])   . Paroxysmal atrial fibrillation (HCC)   . Primary lung adenocarcinoma ([place])     SURGICAL HISTORY: Past Surgical History:  Procedure Laterality Date  . PERICARDIOCENTESIS N/A 03/27/2021   Procedure: PERICARDIOCENTESIS;  Surgeon: Nelva Bush, MD;  Location: [place] [facility];  Service: Cardiovascular;  Laterality: N/A;  . TUBAL LIGATION      SOCIAL HISTORY: Social History   Socioeconomic History  . Marital status: Widowed    Spouse name: Not on file  . Number of children: Not on file  . Years of education: Not on file  . Highest education level: Not on file  Occupational History  . Not on file  Tobacco Use  . Smoking status: Former    Packs/day: 1.00    Years: 40.00    Pack years: 40.00    Types: Cigarettes  . Smokeless tobacco: Never  Vaping Use  . Vaping Use: Never used  Substance and Sexual Activity  . Alcohol use: No  . Drug use: Not Currently    Types: Marijuana  . Sexual activity: Not on file  Other Topics  Concern  . Not on file  Social History Narrative  . Not on file   Social Determinants of Health   Financial Resource Strain: High Risk  . Difficulty of Paying Living Expenses: Very hard  Food Insecurity: Not on file  Transportation Needs: Not on file  Physical Activity: Not on file  Stress: Stress Concern Present  . Feeling of Stress : Very much  Social  Connections: Not on file  Intimate Partner Violence: Not on file    FAMILY HISTORY: Family History  Problem Relation Age of Onset  . Peripheral Artery Disease Mother     ALLERGIES:  is allergic to diazepam, metoprolol, and tapazole [methimazole].  MEDICATIONS:  Current Outpatient Medications  Medication Sig Dispense Refill  . amiodarone (PACERONE) 200 MG tablet Take 1 tablet (200 mg total) by mouth once daily. 90 tablet 3  . dexamethasone (DECADRON) 4 MG tablet Take 1 tablet by mouth in the morning and evening 2 days. Take the day prior and the day after chemo. DO NOT take on the day of chemo. 60 tablet 0  . diltiazem (DILACOR XR) 240 MG 24 hr capsule Take 1 capsule (240 mg total) by mouth once daily. 90 capsule 3  . fluconazole (DIFLUCAN) 100 MG tablet Take 2 tablets (200mg  total) by mouth on Day 1. Then take 1 tablet (100mg ) by mouth once daily thereafter. 15 tablet 0  . folic acid (FOLVITE) 1 MG tablet Take 1 tablet (1 mg total) by mouth once daily. 90 tablet 1  . Multiple Vitamin (MULTI-VITAMINS) TABS Take 1 tablet by mouth daily.    Marland Kitchen albuterol (VENTOLIN HFA) 108 (90 Base) MCG/ACT inhaler Inhale 2 puffs into the lungs every 6 (six) hours as needed for wheezing or shortness of breath. (Patient not taking: Reported on 04/25/2021) 8.5 g 0  . lidocaine (XYLOCAINE) 2 % solution Use as directed. Swish and spit 15 mLs in the mouth or throat once every 6 (six) hours as needed for mouth pain. (Patient not taking: Reported on 04/25/2021) 240 mL 0  . nystatin (MYCOSTATIN) 100000 UNIT/ML suspension Take 5 mLs (500,000 Units total) by mouth 4 (four) times daily. (Patient not taking: Reported on 04/25/2021) 60 mL 0  . ondansetron (ZOFRAN) 4 MG tablet Take 2 tablets (8mg  total) by mouth once every 8 hours as needed for nausea and vomiting. (Patient not taking: Reported on 04/25/2021) 80 tablet 1  . prochlorperazine (COMPAZINE) 10 MG tablet Take 1 tablet (10 mg total) by mouth once every 6 (six) hours as  needed for nausea or vomiting. (Patient not taking: Reported on 04/25/2021) 40 tablet 1   No current facility-administered medications for this visit.      Marland Kitchen  PHYSICAL EXAMINATION: ECOG PERFORMANCE STATUS: 1 - Symptomatic but completely ambulatory  Vitals:   04/25/21 0845  BP: 112/74  Pulse: (!) 108  Resp: 18  Temp: (!) 96.7 F (35.9 C)  SpO2: 95%   Filed Weights   04/25/21 0845  Weight: 124 lb (56.2 kg)    Physical Exam Vitals and nursing note reviewed.  Constitutional:      Comments: Accompanied by daughter.  Patient in wheelchair.  HENT:     Head: Normocephalic and atraumatic.     Mouth/Throat:     Pharynx: Oropharynx is clear.  Eyes:     Extraocular Movements: Extraocular movements intact.     Pupils: Pupils are equal, round, and reactive to light.  Cardiovascular:     Rate and Rhythm: Normal rate and regular rhythm.  Pulmonary:     Comments: Decreased breath sounds bilaterally.  Abdominal:     Palpations: Abdomen is soft.  Musculoskeletal:        General: Normal range of motion.     Cervical back: Normal range of motion.  Skin:    General: Skin is warm.  Neurological:     General: No focal deficit present.     Mental Status: She is alert and oriented to person, place, and time.  Psychiatric:        Behavior: Behavior normal.        Judgment: Judgment normal.     LABORATORY DATA:  I have reviewed the data as listed Lab Results  Component Value Date   WBC 19.6 (H) 04/25/2021   HGB 13.4 04/25/2021   HCT 41.6 04/25/2021   MCV 87.4 04/25/2021   PLT 581 (H) 04/25/2021   Recent Labs    03/27/21 1635 03/28/21 0131 04/10/21 1048 04/12/21 1019 04/25/21 0815  NA  --    < > 130* 127* 136  K  --    < > 3.9 4.0 4.8  CL  --    < > 95* 93* 100  CO2  --    < > 26 24 23   GLUCOSE  --    < > 130* 111* 299*  BUN  --    < > 15 11 11   CREATININE  --    < > 0.56 0.56 0.64  CALCIUM  --    < > 8.6* 8.8* 9.4  GFRNONAA  --    < > >60 >60 >60  PROT 7.7   < >  6.4* 6.9 7.3  ALBUMIN 3.2*   < > 2.8* 2.9* 2.6*  AST 31   < > 22 30 35  ALT 21   < > 34 34 32  ALKPHOS 74   < > 73 81 93  BILITOT 1.2   < > 0.7 0.5 0.5  BILIDIR 0.4*  --   --   --   --   IBILI 0.8  --   --   --   --    < > = values in this interval not displayed.    RADIOGRAPHIC STUDIES: I have personally reviewed the radiological images as listed and agreed with the findings in the report. DG Chest 1 View  Result Date: 03/30/2021 CLINICAL DATA:  Possible pneumothorax EXAM: CHEST  1 VIEW COMPARISON:  03/29/2021 FINDINGS: Spiculated right upper lobe lung mass with relative hyperlucency in the vicinity but without change, prior chest CT did not demonstrate corresponding abnormality other than emphysema. No discrete pleural line is seen. Small left pleural effusion. Mild cardiomegaly with increasing vascular congestion and pulmonary edema. Subsegmental atelectasis at the left lung base. Drainage catheter appears removed. IMPRESSION: 1. Mild cardiomegaly with increasing vascular congestion/mild pulmonary edema and small left effusion 2. Spiculated right upper lobe lung mass. Asymmetrical hyperlucency surrounding the mass without change and no definitive pleural line to suggest pneumothorax. Electronically Signed   By: Donavan Foil M.D.   On: 03/30/2021 01:11   MR Brain W Wo Contrast  Result Date: 04/25/2021 CLINICAL DATA:  Lung cancer staging EXAM: MRI HEAD WITHOUT AND WITH CONTRAST TECHNIQUE: Multiplanar, multiecho pulse sequences of the brain and surrounding structures were obtained without and with intravenous contrast. CONTRAST:  54mL GADAVIST GADOBUTROL 1 MMOL/ML IV SOLN COMPARISON:  None. FINDINGS: Brain: Multiple foci of increased T2 signal, the majority of which correlate with peripherally enhancing, centrally T1 hypointense lesions,  throughout the bilateral cerebral hemispheres, concerning for metastatic disease. 7 x 6 mm lesion in the posterior right temporal lobe (series 18, image 69). 7 x  6 mm lesion in the left parietal lobe (series 18, image 108). 6 x 4 mm lesion in the left frontal lobe (series 18, image 121). Additional focus of increased T2 signal in the right parietal lobe (series 15, image 43), is without correlate on the T1 image and may represent a developing lesion. No acute infarct, hemorrhage, mass, mass effect, or midline shift. Normal ventricles and sulci. Vascular: Normal flow voids. Skull and upper cervical spine: Normal marrow signal. Sinuses/Orbits: Mucosal thickening in the maxillary sinuses, ethmoid air cells, right sphenoid sinus, and left greater than right frontal sinus. The orbits are unremarkable. Other: None. IMPRESSION: Multiple peripherally enhancing, centrally T1 hypointense lesions, with surrounding T2 hyperintense signal in the bilateral cerebral hemispheres, concerning for metastatic disease. Electronically Signed   By: Merilyn Baba M.D.   On: 04/25/2021 15:16   CARDIAC CATHETERIZATION  Result Date: 03/27/2021 Conclusions: Successful pericardiocentesis and tube pericardiotomy via the subxiphoid approach, yielding 500 mL of serosanginous fluid. Recommendations: Admit to ICU. Obtain STAT portable CXR. Follow-up fluid analyses, including cytology. Repeat echocardiogram tomorrow.  Anticipate removal of drain when output is less than 50-100 mL/24 hours and minimal residual fluid is present by echo. Nelva Bush, MD [facility]  DG Chest Port 1 View  Result Date: 04/04/2021 CLINICAL DATA:  Respiratory failure. EXAM: PORTABLE CHEST 1 VIEW COMPARISON:  04/01/2021.  CT 03/27/2021. FINDINGS: Mediastinum and hilar structures are stable. Borderline cardiomegaly. No pulmonary venous congestion. Persistent large mass right upper lung again noted. Diffuse bilateral interstitial prominence again noted. Although interstitial edema and/or pneumonitis could present this fashion, lymphangitic tumor spread again cannot be excluded. Bibasilar atelectasis. Right base  infiltrate. Right base pneumonia cannot be excluded. Small bilateral pleural effusions again noted. No acute bony abnormality identified. IMPRESSION: 1.  Large right upper lung mass again noted without interim change. 2. Diffuse bilateral interstitial prominence again noted. Although interstitial edema and/or pneumonitis could present in this fashion, lymphangitic tumor spread again cannot be excluded. 3. Bibasilar atelectasis. Right base infiltrate. Right base pneumonia cannot be excluded. Small bilateral pleural effusions again noted. 4.  Borderline cardiomegaly. Electronically Signed   By: Marcello Moores  Register   On: 04/04/2021 07:53   DG Chest Port 1 View  Result Date: 04/01/2021 CLINICAL DATA:  Shortness of breath EXAM: PORTABLE CHEST 1 VIEW COMPARISON:  CT 03/27/2021, radiograph 03/30/2021 FINDINGS: Diminishing lung volumes with increasing veiling opacity in the lung bases, right greater than left which could reflect developing pleural effusions. Pulmonary vascularity is indistinct. Cardiomediastinal silhouette is stable from prior with known large pericardial effusion. Redemonstration of a large rounded masslike opacity in the right upper lung corresponding to the known right upper lung malignancy. No acute or worrisome osseous or soft tissue abnormality of the chest wall. IMPRESSION: Diminishing lung volumes. Increasing veiling opacity may reflect developing pleural effusions. Additional opacity in the bases may reflect some passive atelectatic change or underlying airspace opacity. Diffusely worsening interstitial opacity, concerning for developing edema as well given pulmonary vascular congestion. Underlying lymphangitic spread is not fully excluded. Redemonstrated masslike opacity in the right upper lobe. Electronically Signed   By: Lovena Le M.D.   On: 04/01/2021 21:16   DG Chest Port 1 View  Result Date: 03/29/2021 CLINICAL DATA:  Shortness of breath and chest pain EXAM: PORTABLE CHEST 1 VIEW  COMPARISON:  03/27/2021 FINDINGS: Cardiac shadow is  mildly enlarged but stable. Spiculated right upper lobe mass lesion is again noted. Mild increase in parenchymal edema is seen. Small bore catheter is again noted over the cardiac shadow likely related to a pericardial drain. No pneumothorax is noted. IMPRESSION: Increasing edema particularly on the right. Stable right upper lobe mass lesion. No new focal abnormality is noted. Electronically Signed   By: Inez Catalina M.D.   On: 03/29/2021 09:02   DG Chest Port 1 View  Result Date: 03/27/2021 CLINICAL DATA:  Cardiac tamponade EXAM: PORTABLE CHEST 1 VIEW COMPARISON:  Chest x-ray 03/27/2021, CT chest 03/27/2021 FINDINGS: Right upper lobe lung mass redemonstrated. Emphysema with bronchitic changes. Cardiomegaly. Interim placement of drainage catheter over the heart presumably a pericardial drainage catheter. Cardiac silhouette decreased compared to prior. Slight increased interstitial opacity possibly superimposed edema. No pneumothorax IMPRESSION: 1. Placement of presumed pericardial drainage catheter with decreased cardiomegaly 2. Emphysema with chronic bronchitic change, slight increased interstitial opacity may reflect superimposed mild edema 3. Redemonstrated spiculated lung mass at the right apex Electronically Signed   By: Donavan Foil M.D.   On: 03/27/2021 22:28   ECHOCARDIOGRAM COMPLETE  Result Date: 03/28/2021    ECHOCARDIOGRAM REPORT   Patient Name:   TWISHA VANPELT Date of Exam: 03/28/2021 Medical Rec #:  [id number]   Height:       61.5 in Accession #:    [id number]  Weight:       128.7 lb Date of Birth:  [birth year]  BSA:          1.576 m Patient Age:    38 years    BP:           128/88 mmHg Patient Gender: F           HR:           105 bpm. Exam Location:  [facility] Procedure: 2D Echo, Color Doppler, Cardiac Doppler and Intracardiac            Opacification Agent Indications:     I31.4 Tamponade  History:         Patient has no prior history of  Echocardiogram examinations.                  Signs/Symptoms:Shortness of Breath; Risk Factors:Current                  Smoker. Graves disease.  Sonographer:     Charmayne Sheer RDCS (AE) Referring Phys:  [id number] [place] END Diagnosing Phys: Kate Sable MD  Sonographer Comments: Technically difficult study due to poor echo windows. IMPRESSIONS  1. Left ventricular ejection fraction, by estimation, is 55%. The left ventricle has normal function. The left ventricle has no regional wall motion abnormalities. Left ventricular diastolic parameters are consistent with Grade II diastolic dysfunction (pseudonormalization).  2. Right ventricular systolic function is normal. The right ventricular size is normal.  3. The mitral valve is normal in structure. No evidence of mitral valve regurgitation.  4. The aortic valve was not well visualized. Aortic valve regurgitation is not visualized.  5. The inferior vena cava is dilated in size with >50% respiratory variability, suggesting right atrial pressure of 8 mmHg. FINDINGS  Left Ventricle: Left ventricular ejection fraction, by estimation, is 55%. The left ventricle has normal function. The left ventricle has no regional wall motion abnormalities. Definity contrast agent was given IV to delineate the left ventricular endocardial borders. The left ventricular internal cavity size was normal in size. There is no left ventricular  hypertrophy. Left ventricular diastolic parameters are consistent with Grade II diastolic dysfunction (pseudonormalization). Right Ventricle: The right ventricular size is normal. No increase in right ventricular wall thickness. Right ventricular systolic function is normal. Left Atrium: Left atrial size was normal in size. Right Atrium: Right atrial size was normal in size. Pericardium: Trivial pericardial effusion is present. Mitral Valve: The mitral valve is normal in structure. No evidence of mitral valve regurgitation. Tricuspid Valve: The tricuspid  valve is not well visualized. Tricuspid valve regurgitation is not demonstrated. Aortic Valve: The aortic valve was not well visualized. Aortic valve regurgitation is not visualized. Aortic valve mean gradient measures 2.0 mmHg. Aortic valve peak gradient measures 4.7 mmHg. Aortic valve area, by VTI measures 3.44 cm. Pulmonic Valve: The pulmonic valve was not well visualized. Pulmonic valve regurgitation is not visualized. Aorta: The aortic root is normal in size and structure. Venous: The inferior vena cava is dilated in size with greater than 50% respiratory variability, suggesting right atrial pressure of 8 mmHg. IAS/Shunts: No atrial level shunt detected by color flow Doppler.  LEFT VENTRICLE PLAX 2D LVIDd:         3.90 cm  Diastology LVIDs:         3.30 cm  LV e' medial:    9.46 cm/s LV PW:         1.20 cm  LV E/e' medial:  8.9 LV IVS:        0.90 cm  LV e' lateral:   4.57 cm/s LVOT diam:     2.20 cm  LV E/e' lateral: 18.4 LV SV:         54 LV SV Index:   34 LVOT Area:     3.80 cm  LEFT ATRIUM           Index LA diam:      3.70 cm 2.35 cm/m LA Vol (A4C): 38.0 ml 24.11 ml/m  AORTIC VALVE                   PULMONIC VALVE AV Area (Vmax):    3.40 cm    PV Vmax:       0.97 m/s AV Area (Vmean):   3.66 cm    PV Vmean:      65.700 cm/s AV Area (VTI):     3.44 cm    PV VTI:        0.122 m AV Vmax:           108.00 cm/s PV Peak grad:  3.7 mmHg AV Vmean:          71.900 cm/s PV Mean grad:  2.0 mmHg AV VTI:            0.156 m AV Peak Grad:      4.7 mmHg AV Mean Grad:      2.0 mmHg LVOT Vmax:         96.60 cm/s LVOT Vmean:        69.300 cm/s LVOT VTI:          0.141 m LVOT/AV VTI ratio: 0.90  AORTA Ao Root diam: 2.80 cm MITRAL VALVE MV Area (PHT): 5.07 cm    SHUNTS MV Decel Time: 150 msec    Systemic VTI:  0.14 m MV E velocity: 84.23 cm/s  Systemic Diam: 2.20 cm MV A velocity: 88.23 cm/s MV E/A ratio:  0.95 Kate Sable MD Electronically signed by Kate Sable MD Signature Date/Time: 03/28/2021/1:51:00 PM     Final  ECHOCARDIOGRAM LIMITED  Result Date: 04/02/2021    ECHOCARDIOGRAM LIMITED REPORT   Patient Name:   FARRON LAFOND Date of Exam: 04/02/2021 Medical Rec #:  [id number]   Height:       61.5 in Accession #:    [id number]  Weight:       137.6 lb Date of Birth:  [birth year]  BSA:          1.621 m Patient Age:    39 years    BP:           106/77 mmHg Patient Gender: F           HR:           87 bpm. Exam Location:  [facility] Procedure: Limited Echo, Color Doppler and Cardiac Doppler Indications:     Pericardial Effusion I31.3  History:         Patient has prior history of Echocardiogram examinations, most                  recent 03/29/2021. COPD; Risk Factors:Hypertension.  Sonographer:     Sherrie Sport RDCS (AE) Referring Phys:  [id number] [place] Diagnosing Phys: Harrell Gave End MD  Sonographer Comments: Image acquisition challenging due to COPD. IMPRESSIONS  1. Left ventricular ejection fraction, by estimation, is >55%. The left ventricle has normal function.  2. Right ventricular systolic function is normal. Moderately increased right ventricular wall thickness.  3. Echogenic material in the pericardial space is noted, which could represent epicardial fat. However, in the setting of malignant pericardial effusion, tumor cannot be excluded. FINDINGS  Left Ventricle: Left ventricular ejection fraction, by estimation, is >55%. The left ventricle has normal function. The left ventricular internal cavity size was normal in size. There is borderline left ventricular hypertrophy. Right Ventricle: Moderately increased right ventricular wall thickness. Right ventricular systolic function is normal. Pericardium: Echogenic material in the pericardial space is noted, which could represent epicardial fat. However, in the setting of malignant pericardial effusion, tumor cannot be excluded. Trivial pericardial effusion is present. LEFT VENTRICLE PLAX 2D LVIDd:         4.23 cm LVIDs:         2.58 cm LV PW:         1.01 cm LV  IVS:        1.01 cm LVOT diam:     2.00 cm LVOT Area:     3.14 cm  LEFT ATRIUM         Index LA diam:    3.60 cm 2.22 cm/m   AORTA Ao Root diam: 2.90 cm  SHUNTS Systemic Diam: 2.00 cm Nelva Bush MD Electronically signed by Nelva Bush MD Signature Date/Time: 04/02/2021/12:36:25 PM    Final    ECHOCARDIOGRAM LIMITED  Result Date: 03/29/2021    ECHOCARDIOGRAM LIMITED REPORT   Patient Name:   SHAWNESE MAGNER Date of Exam: 03/29/2021 Medical Rec #:  [id number]   Height:       61.5 in Accession #:    [id number]  Weight:       136.5 lb Date of Birth:  05-24-1961  BSA:          1.615 m Patient Age:    48 years    BP:           137/91 mmHg Patient Gender: F           HR:           116 bpm. Exam Location:  [facility] Procedure: Cardiac Doppler, Color Doppler and Limited Echo Indications:     Pericardial Effusion I31.3  History:         Patient has prior history of Echocardiogram examinations, most                  recent 03/28/2021. Risk Factors:Hypertension. Cardiac tamponade.  Sonographer:     Sherrie Sport RDCS (AE) Referring Phys:  [id number] [place] Diagnosing Phys: Ida Rogue MD  Sonographer Comments: Technically difficult study due to poor echo windows. IMPRESSIONS  1. Trvial pericardial effusion.  2. Left ventricular ejection fraction, by estimation, is 60 to 65%. The left ventricle has normal function. The left ventricle has no regional wall motion abnormalities.  3. Right ventricular systolic function is normal. The right ventricular size is normal.  4. The mitral valve is normal in structure. No evidence of mitral valve regurgitation. No evidence of mitral stenosis. FINDINGS  Left Ventricle: Left ventricular ejection fraction, by estimation, is 60 to 65%. The left ventricle has normal function. The left ventricle has no regional wall motion abnormalities. The left ventricular internal cavity size was normal in size. There is  no left ventricular hypertrophy. Right Ventricle: The right ventricular size is  normal. No increase in right ventricular wall thickness. Right ventricular systolic function is normal. Left Atrium: Left atrial size was normal in size. Right Atrium: Right atrial size was normal in size. Pericardium: Trivial pericardial effusion is present. Mitral Valve: The mitral valve is normal in structure. No evidence of mitral valve stenosis. Tricuspid Valve: The tricuspid valve is normal in structure. Tricuspid valve regurgitation is not demonstrated. No evidence of tricuspid stenosis. Aortic Valve: The aortic valve was not well visualized. Aortic valve regurgitation is not visualized. No aortic stenosis is present. Pulmonic Valve: The pulmonic valve was normal in structure. Pulmonic valve regurgitation is not visualized. No evidence of pulmonic stenosis. Aorta: The aortic root is normal in size and structure. Venous: The inferior vena cava is normal in size with greater than 50% respiratory variability, suggesting right atrial pressure of 3 mmHg. IAS/Shunts: No atrial level shunt detected by color flow Doppler. LEFT VENTRICLE PLAX 2D LVIDd:         4.04 cm LVIDs:         2.55 cm LV PW:         1.31 cm LV IVS:        0.93 cm LVOT diam:     2.00 cm LVOT Area:     3.14 cm  LEFT ATRIUM           Index      RIGHT ATRIUM           Index LA diam:      3.10 cm 1.92 cm/m RA Area:     10.20 cm LA Vol (A4C): 11.2 ml 6.93 ml/m RA Volume:   23.00 ml  14.24 ml/m   AORTA Ao Root diam: 2.80 cm TRICUSPID VALVE TR Peak grad:   4.8 mmHg TR Vmax:        110.00 cm/s  SHUNTS Systemic Diam: 2.00 cm Ida Rogue MD Electronically signed by Ida Rogue MD Signature Date/Time: 03/29/2021/3:32:05 PM    Final     ASSESSMENT & PLAN:   Cancer of upper lobe of right lung ([place]) #Stage IV-lung cancer-adenocarcinoma. I reviewed the stage as stage IV/pathology as non-small cell/adenocarcinoma with patient.  MRI brain pending-  # Recommend carboplatin and Alimta-keytruda  Chemo-immunotherapy every 3 weeks palliative basis.  Again reviewed the potential side effects including but not limited to nausea vomiting diarrhea anemia related side effects.  # Malignant pericardial tamponade-s/p pericardiocentesis;-secondary to lung adenocarcinoma-repeat AUG 2022-2D echo negative for any pericardial effusion.  Monitor closely for symptoms.  #Acute respiratory failure-multifactorial-malignancy/lymphangitic spread/COPD stable-significant provement s/p/bronchodilators/diuretics- STABLE.'  #A. Fib-amiodarone/Cardizem as per cardiology-  STABLE. Not on anti-coagulation [?  Hemorrhagic pericardial effusion]  # PBG- 199-on steroids; [prediabetes]-monitor closely.  # IV access: Recommend port placement.  # DISPOSITION: # referral to [facility] re: malnutrition # treatment today # referral IR re: port palcement- re: IV access # labs- in 10 days-MD;  Labs- cbc/bmp;possible IVFs.  # follow up in 3 weeks- MD; labs- cbc/cmp; carbo-alimta-keytruda-Dr.B  Addendum: on 8/18-I spoke with patient's daughter regarding the results of the MRI brain that shows multiple subcentimeter brain lesions.  Unclear if this lesions are symptomatic [as per the daughter patient has cognitive impairment/impulsivity mood swings etc. no obvious headaches or vision changes or seizures.].  Discussed whole brain radiation-again potential side effects including long-term cognitive impairment/dementia etc [if patient were to survive long].  Again reviewed with the daughter the median survival is anywhere between 1 to 2 years; although these numbers do not belong to the patient.  Daughter-states that patient would not want radiation [as family member did poorly with radiation in the past].  Discussed option of continued systemic therapy-and short-term imaging if patient declines radiation as a treatment option.  However, the daughter will speak to patient-and inform us of the discussion.  We will reach out to the patient again tomorrow.     All questions were answered. The  patient knows to call the clinic with any problems, questions or concerns.    Cammie Sickle, MD 04/26/2021 7:26 PM

## 2021-04-27 ENCOUNTER — Encounter: Payer: Self-pay | Admitting: Internal Medicine

## 2021-04-27 ENCOUNTER — Other Ambulatory Visit: Payer: Self-pay

## 2021-04-27 ENCOUNTER — Ambulatory Visit
Admission: RE | Admit: 2021-04-27 | Discharge: 2021-04-27 | Disposition: A | Discharge status: Home / Self Care | Payer: Medicaid Other | Source: Ambulatory Visit | Attending: Internal Medicine | Admitting: Internal Medicine

## 2021-04-27 DIAGNOSIS — Z79899 Other long term (current) drug therapy: Secondary | ICD-10-CM | POA: Diagnosis not present

## 2021-04-27 DIAGNOSIS — Z888 Allergy status to other drugs, medicaments and biological substances status: Secondary | ICD-10-CM | POA: Insufficient documentation

## 2021-04-27 DIAGNOSIS — I313 Pericardial effusion (noninflammatory): Secondary | ICD-10-CM | POA: Diagnosis not present

## 2021-04-27 DIAGNOSIS — C3411 Malignant neoplasm of upper lobe, right bronchus or lung: Secondary | ICD-10-CM | POA: Insufficient documentation

## 2021-04-27 DIAGNOSIS — Z87891 Personal history of nicotine dependence: Secondary | ICD-10-CM | POA: Insufficient documentation

## 2021-04-27 DIAGNOSIS — I480 Paroxysmal atrial fibrillation: Secondary | ICD-10-CM | POA: Diagnosis not present

## 2021-04-27 HISTORY — PX: IR IMAGING GUIDED PORT INSERTION: IMG5740

## 2021-04-27 MED ORDER — FENTANYL CITRATE (PF) 100 MCG/2ML IJ SOLN
INTRAMUSCULAR | Status: AC | PRN
  Administered 2021-04-27 (×2): 50 ug via INTRAVENOUS

## 2021-04-27 MED ORDER — HEPARIN SOD (PORK) LOCK FLUSH 100 UNIT/ML IV SOLN
INTRAVENOUS | Status: AC
  Filled 2021-04-27: qty 5

## 2021-04-27 MED ORDER — MIDAZOLAM HCL 2 MG/2ML IJ SOLN
INTRAMUSCULAR | Status: AC
  Filled 2021-04-27: qty 2

## 2021-04-27 MED ORDER — SODIUM CHLORIDE 0.9 % IV SOLN
INTRAVENOUS | Status: DC
  Filled 2021-04-27: qty 1000

## 2021-04-27 MED ORDER — FENTANYL CITRATE (PF) 100 MCG/2ML IJ SOLN
INTRAMUSCULAR | Status: AC
  Filled 2021-04-27: qty 2

## 2021-04-27 MED ORDER — LIDOCAINE-EPINEPHRINE 2 %-1:100000 IJ SOLN
INTRAMUSCULAR | Status: AC | PRN
  Administered 2021-04-27: 10 mL

## 2021-04-27 MED ORDER — MIDAZOLAM HCL 2 MG/2ML IJ SOLN
INTRAMUSCULAR | Status: AC | PRN
  Administered 2021-04-27 (×2): 1 mg via INTRAVENOUS

## 2021-04-27 NOTE — Procedures (Signed)
Interventional Radiology Procedure Note  Date of Procedure: 04/27/2021  Procedure: Port placement   Findings:  1. Successful placement of right chest port using the right IJ   Complications: No immediate complications noted.   Estimated Blood Loss: minimal  Follow-up and Recommendations: 1. Ready for use    Albin Felling, MD  Vascular & Interventional Radiology  04/27/2021 1:04 PM

## 2021-04-27 NOTE — Progress Notes (Signed)
Patient clinically stable post Port Placement per Dr Denna Haggard, tolerated well. Denies complaints post procedure. Vitals stable pre and post procedure. Received Versed 2 mg along with Fentanyl 100 mcg IV for procedure. Report given to [facility] in specials post procedure/recovery.

## 2021-04-27 NOTE — Consult Note (Signed)
Chief Complaint: Patient was seen in consultation today for Port-A-Cath placement  Referring Physician(s): Brahmanday,Govinda R  Supervising Physician: Juliet Rude  Patient Status: [facility] - Out-pt  History of Present Illness: Nichole Park is a 60 y.o. 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  She has poor venous access and presents today for Port-A-Cath placement to assist with treatment.  Past Medical History:  Diagnosis Date   Graves disease    Malignant pericardial effusion (HCC)    Paroxysmal atrial fibrillation ([place])    Primary lung adenocarcinoma [facility])     Past Surgical History:  Procedure Laterality Date   PERICARDIOCENTESIS N/A 03/27/2021   Procedure: PERICARDIOCENTESIS;  Surgeon: Nelva Bush, MD;  Location: [place] [facility];  Service: Cardiovascular;  Laterality: N/A;   TUBAL LIGATION      Allergies: Diazepam, Metoprolol, and Tapazole [methimazole]  Medications: Prior to Admission medications   Medication Sig Start Date End Date Taking? Authorizing Provider  albuterol (VENTOLIN HFA) 108 (90 Base) MCG/ACT inhaler Inhale 2 puffs into the lungs every 6 (six) hours as needed for wheezing or shortness of breath. Patient not taking: Reported on 04/25/2021 04/10/21 05/10/21  Wyvonnia Dusky, MD  amiodarone (PACERONE) 200 MG tablet Take 1 tablet (200 mg total) by mouth once daily. 04/18/21 04/13/22  End, Harrell Gave, MD  dexamethasone (DECADRON) 4 MG tablet Take 1 tablet by mouth in the morning and evening 2 days. Take the day prior and the day after chemo. DO NOT take on the day of chemo. 04/17/21   Cammie Sickle, MD  diltiazem (DILACOR XR) 240 MG 24 hr capsule Take 1 capsule (240 mg total) by mouth once daily. 04/18/21 04/13/22  End, Harrell Gave, MD  fluconazole  (DIFLUCAN) 100 MG tablet Take 2 tablets (200mg  total) by mouth on Day 1. Then take 1 tablet (100mg ) by mouth once daily thereafter. 04/17/21   Borders, Kirt Boys, NP  folic acid (FOLVITE) 1 MG tablet Take 1 tablet (1 mg total) by mouth once daily. 04/16/21   Cammie Sickle, MD  lidocaine (XYLOCAINE) 2 % solution Use as directed. Swish and spit 15 mLs in the mouth or throat once every 6 (six) hours as needed for mouth pain. Patient not taking: Reported on 04/25/2021 04/17/21   Borders, Kirt Boys, NP  Multiple Vitamin (MULTI-VITAMINS) TABS Take 1 tablet by mouth daily.    [provider]  nystatin (MYCOSTATIN) 100000 UNIT/ML suspension Take 5 mLs (500,000 Units total) by mouth 4 (four) times daily. Patient not taking: Reported on 04/25/2021 04/12/21   Cammie Sickle, MD  ondansetron (ZOFRAN) 4 MG tablet Take 2 tablets (8mg  total) by mouth once every 8 hours as needed for nausea and vomiting. Patient not taking: Reported on 04/25/2021 04/16/21   Cammie Sickle, MD  prochlorperazine (COMPAZINE) 10 MG tablet Take 1 tablet (10 mg total) by mouth once every 6 (six) hours as needed for nausea or vomiting. Patient not taking: Reported on 04/25/2021 04/16/21   Cammie Sickle, MD  diltiazem (CARDIZEM CD) 240 MG 24 hr capsule Take 1 capsule (240 mg total) by mouth daily. 04/10/21 04/10/21  Wyvonnia Dusky, MD     Family History  Problem Relation Age of Onset   Peripheral Artery Disease Mother     Social History   Socioeconomic History   Marital status: Widowed  Spouse name: Not on file   Number of children: Not on file   Years of education: Not on file   Highest education level: Not on file  Occupational History   Not on file  Tobacco Use   Smoking status: Former    Packs/day: 1.00    Years: 40.00    Pack years: 40.00    Types: Cigarettes   Smokeless tobacco: Never  Vaping Use   Vaping Use: Never used  Substance and Sexual Activity   Alcohol use: No   Drug use: Not  Currently    Types: Marijuana   Sexual activity: Not on file  Other Topics Concern   Not on file  Social History Narrative   Not on file   Social Determinants of Health   Financial Resource Strain: High Risk   Difficulty of Paying Living Expenses: Very hard  Food Insecurity: Not on file  Transportation Needs: Not on file  Physical Activity: Not on file  Stress: Stress Concern Present   Feeling of Stress : Very much  Social Connections: Not on file      Review of Systems: denies fever, headache, chest pain, abdominal/back pain, nausea, vomiting or bleeding; she does have dyspnea with exertion and occasional cough  Vital Signs: Vitals:   04/27/21 1107  BP: 104/76  Pulse: 73  Resp: 18  Temp: 97.7 F (36.5 C)  SpO2: 90%      Physical Exam patient awake, alert.  Chest with slightly diminished breath sounds bases.  Heart with regular rate and rhythm.  Abdomen soft, positive bowel sounds, nontender.  Extremities with full range of motion.  Imaging: DG Chest 1 View  Result Date: 03/30/2021 CLINICAL DATA:  Possible pneumothorax EXAM: CHEST  1 VIEW COMPARISON:  03/29/2021 FINDINGS: Spiculated right upper lobe lung mass with relative hyperlucency in the vicinity but without change, prior chest CT did not demonstrate corresponding abnormality other than emphysema. No discrete pleural line is seen. Small left pleural effusion. Mild cardiomegaly with increasing vascular congestion and pulmonary edema. Subsegmental atelectasis at the left lung base. Drainage catheter appears removed. IMPRESSION: 1. Mild cardiomegaly with increasing vascular congestion/mild pulmonary edema and small left effusion 2. Spiculated right upper lobe lung mass. Asymmetrical hyperlucency surrounding the mass without change and no definitive pleural line to suggest pneumothorax. Electronically Signed   By: Donavan Foil M.D.   On: 03/30/2021 01:11   MR Brain W Wo Contrast  Result Date: 04/25/2021 CLINICAL DATA:   Lung cancer staging EXAM: MRI HEAD WITHOUT AND WITH CONTRAST TECHNIQUE: Multiplanar, multiecho pulse sequences of the brain and surrounding structures were obtained without and with intravenous contrast. CONTRAST:  83mL GADAVIST GADOBUTROL 1 MMOL/ML IV SOLN COMPARISON:  None. FINDINGS: Brain: Multiple foci of increased T2 signal, the majority of which correlate with peripherally enhancing, centrally T1 hypointense lesions, throughout the bilateral cerebral hemispheres, concerning for metastatic disease. 7 x 6 mm lesion in the posterior right temporal lobe (series 18, image 69). 7 x 6 mm lesion in the left parietal lobe (series 18, image 108). 6 x 4 mm lesion in the left frontal lobe (series 18, image 121). Additional focus of increased T2 signal in the right parietal lobe (series 15, image 43), is without correlate on the T1 image and may represent a developing lesion. No acute infarct, hemorrhage, mass, mass effect, or midline shift. Normal ventricles and sulci. Vascular: Normal flow voids. Skull and upper cervical spine: Normal marrow signal. Sinuses/Orbits: Mucosal thickening in the maxillary sinuses, ethmoid air cells,  right sphenoid sinus, and left greater than right frontal sinus. The orbits are unremarkable. Other: None. IMPRESSION: Multiple peripherally enhancing, centrally T1 hypointense lesions, with surrounding T2 hyperintense signal in the bilateral cerebral hemispheres, concerning for metastatic disease. Electronically Signed   By: Merilyn Baba M.D.   On: 04/25/2021 15:16   DG Chest Port 1 View  Result Date: 04/04/2021 CLINICAL DATA:  Respiratory failure. EXAM: PORTABLE CHEST 1 VIEW COMPARISON:  04/01/2021.  CT 03/27/2021. FINDINGS: Mediastinum and hilar structures are stable. Borderline cardiomegaly. No pulmonary venous congestion. Persistent large mass right upper lung again noted. Diffuse bilateral interstitial prominence again noted. Although interstitial edema and/or pneumonitis could present  this fashion, lymphangitic tumor spread again cannot be excluded. Bibasilar atelectasis. Right base infiltrate. Right base pneumonia cannot be excluded. Small bilateral pleural effusions again noted. No acute bony abnormality identified. IMPRESSION: 1.  Large right upper lung mass again noted without interim change. 2. Diffuse bilateral interstitial prominence again noted. Although interstitial edema and/or pneumonitis could present in this fashion, lymphangitic tumor spread again cannot be excluded. 3. Bibasilar atelectasis. Right base infiltrate. Right base pneumonia cannot be excluded. Small bilateral pleural effusions again noted. 4.  Borderline cardiomegaly. Electronically Signed   By: Marcello Moores  Register   On: 04/04/2021 07:53   DG Chest Port 1 View  Result Date: 04/01/2021 CLINICAL DATA:  Shortness of breath EXAM: PORTABLE CHEST 1 VIEW COMPARISON:  CT 03/27/2021, radiograph 03/30/2021 FINDINGS: Diminishing lung volumes with increasing veiling opacity in the lung bases, right greater than left which could reflect developing pleural effusions. Pulmonary vascularity is indistinct. Cardiomediastinal silhouette is stable from prior with known large pericardial effusion. Redemonstration of a large rounded masslike opacity in the right upper lung corresponding to the known right upper lung malignancy. No acute or worrisome osseous or soft tissue abnormality of the chest wall. IMPRESSION: Diminishing lung volumes. Increasing veiling opacity may reflect developing pleural effusions. Additional opacity in the bases may reflect some passive atelectatic change or underlying airspace opacity. Diffusely worsening interstitial opacity, concerning for developing edema as well given pulmonary vascular congestion. Underlying lymphangitic spread is not fully excluded. Redemonstrated masslike opacity in the right upper lobe. Electronically Signed   By: Lovena Le M.D.   On: 04/01/2021 21:16   DG Chest Port 1 View  Result  Date: 03/29/2021 CLINICAL DATA:  Shortness of breath and chest pain EXAM: PORTABLE CHEST 1 VIEW COMPARISON:  03/27/2021 FINDINGS: Cardiac shadow is mildly enlarged but stable. Spiculated right upper lobe mass lesion is again noted. Mild increase in parenchymal edema is seen. Small bore catheter is again noted over the cardiac shadow likely related to a pericardial drain. No pneumothorax is noted. IMPRESSION: Increasing edema particularly on the right. Stable right upper lobe mass lesion. No new focal abnormality is noted. Electronically Signed   By: Inez Catalina M.D.   On: 03/29/2021 09:02   ECHOCARDIOGRAM LIMITED  Result Date: 04/02/2021    ECHOCARDIOGRAM LIMITED REPORT   Patient Name:   Nichole Park Date of Exam: 04/02/2021 Medical Rec #:  [id number]   Height:       61.5 in Accession #:    [id number]  Weight:       137.6 lb Date of Birth:  05/19/1961  BSA:          1.621 m Patient Age:    89 years    BP:           106/77 mmHg Patient Gender: F  HR:           87 bpm. Exam Location:  [facility] Procedure: Limited Echo, Color Doppler and Cardiac Doppler Indications:     Pericardial Effusion I31.3  History:         Patient has prior history of Echocardiogram examinations, most                  recent 03/29/2021. COPD; Risk Factors:Hypertension.  Sonographer:     Sherrie Sport RDCS (AE) Referring Phys:  [id number] [place] Diagnosing Phys: Harrell Gave End MD  Sonographer Comments: Image acquisition challenging due to COPD. IMPRESSIONS  1. Left ventricular ejection fraction, by estimation, is >55%. The left ventricle has normal function.  2. Right ventricular systolic function is normal. Moderately increased right ventricular wall thickness.  3. Echogenic material in the pericardial space is noted, which could represent epicardial fat. However, in the setting of malignant pericardial effusion, tumor cannot be excluded. FINDINGS  Left Ventricle: Left ventricular ejection fraction, by estimation, is >55%. The left  ventricle has normal function. The left ventricular internal cavity size was normal in size. There is borderline left ventricular hypertrophy. Right Ventricle: Moderately increased right ventricular wall thickness. Right ventricular systolic function is normal. Pericardium: Echogenic material in the pericardial space is noted, which could represent epicardial fat. However, in the setting of malignant pericardial effusion, tumor cannot be excluded. Trivial pericardial effusion is present. LEFT VENTRICLE PLAX 2D LVIDd:         4.23 cm LVIDs:         2.58 cm LV PW:         1.01 cm LV IVS:        1.01 cm LVOT diam:     2.00 cm LVOT Area:     3.14 cm  LEFT ATRIUM         Index LA diam:    3.60 cm 2.22 cm/m   AORTA Ao Root diam: 2.90 cm  SHUNTS Systemic Diam: 2.00 cm Nelva Bush MD Electronically signed by Nelva Bush MD Signature Date/Time: 04/02/2021/12:36:25 PM    Final    ECHOCARDIOGRAM LIMITED  Result Date: 03/29/2021    ECHOCARDIOGRAM LIMITED REPORT   Patient Name:   Nichole Park Date of Exam: 03/29/2021 Medical Rec #:  [id number]   Height:       61.5 in Accession #:    [id number]  Weight:       136.5 lb Date of Birth:  [birth year]  BSA:          1.615 m Patient Age:    64 years    BP:           137/91 mmHg Patient Gender: F           HR:           116 bpm. Exam Location:  [facility] Procedure: Cardiac Doppler, Color Doppler and Limited Echo Indications:     Pericardial Effusion I31.3  History:         Patient has prior history of Echocardiogram examinations, most                  recent 03/28/2021. Risk Factors:Hypertension. Cardiac tamponade.  Sonographer:     Sherrie Sport RDCS (AE) Referring Phys:  [id number] [place] Diagnosing Phys: Ida Rogue MD  Sonographer Comments: Technically difficult study due to poor echo windows. IMPRESSIONS  1. Trvial pericardial effusion.  2. Left ventricular ejection fraction, by estimation, is 60 to 65%. The left  ventricle has normal function. The left ventricle has no  regional wall motion abnormalities.  3. Right ventricular systolic function is normal. The right ventricular size is normal.  4. The mitral valve is normal in structure. No evidence of mitral valve regurgitation. No evidence of mitral stenosis. FINDINGS  Left Ventricle: Left ventricular ejection fraction, by estimation, is 60 to 65%. The left ventricle has normal function. The left ventricle has no regional wall motion abnormalities. The left ventricular internal cavity size was normal in size. There is  no left ventricular hypertrophy. Right Ventricle: The right ventricular size is normal. No increase in right ventricular wall thickness. Right ventricular systolic function is normal. Left Atrium: Left atrial size was normal in size. Right Atrium: Right atrial size was normal in size. Pericardium: Trivial pericardial effusion is present. Mitral Valve: The mitral valve is normal in structure. No evidence of mitral valve stenosis. Tricuspid Valve: The tricuspid valve is normal in structure. Tricuspid valve regurgitation is not demonstrated. No evidence of tricuspid stenosis. Aortic Valve: The aortic valve was not well visualized. Aortic valve regurgitation is not visualized. No aortic stenosis is present. Pulmonic Valve: The pulmonic valve was normal in structure. Pulmonic valve regurgitation is not visualized. No evidence of pulmonic stenosis. Aorta: The aortic root is normal in size and structure. Venous: The inferior vena cava is normal in size with greater than 50% respiratory variability, suggesting right atrial pressure of 3 mmHg. IAS/Shunts: No atrial level shunt detected by color flow Doppler. LEFT VENTRICLE PLAX 2D LVIDd:         4.04 cm LVIDs:         2.55 cm LV PW:         1.31 cm LV IVS:        0.93 cm LVOT diam:     2.00 cm LVOT Area:     3.14 cm  LEFT ATRIUM           Index      RIGHT ATRIUM           Index LA diam:      3.10 cm 1.92 cm/m RA Area:     10.20 cm LA Vol (A4C): 11.2 ml 6.93 ml/m RA  Volume:   23.00 ml  14.24 ml/m   AORTA Ao Root diam: 2.80 cm TRICUSPID VALVE TR Peak grad:   4.8 mmHg TR Vmax:        110.00 cm/s  SHUNTS Systemic Diam: 2.00 cm Ida Rogue MD Electronically signed by Ida Rogue MD Signature Date/Time: 03/29/2021/3:32:05 PM    Final     Labs:  CBC: Recent Labs    04/09/21 0417 04/10/21 0855 04/12/21 1019 04/25/21 0815  WBC 20.1* 21.0* 18.2* 19.6*  HGB 14.3 14.8 14.4 13.4  HCT 43.0 44.8 43.4 41.6  PLT 504* 486* 361 581*    COAGS: Recent Labs    03/27/21 1701  INR 1.1  APTT 28    BMP: Recent Labs    04/09/21 0417 04/10/21 1048 04/12/21 1019 04/25/21 0815  NA 137 130* 127* 136  K 4.7 3.9 4.0 4.8  CL 99 95* 93* 100  CO2 28 26 24 23   GLUCOSE 122* 130* 111* 299*  BUN 20 15 11 11   CALCIUM 9.2 8.6* 8.8* 9.4  CREATININE 0.66 0.56 0.56 0.64  GFRNONAA >60 >60 >60 >60    LIVER FUNCTION TESTS: Recent Labs    04/09/21 0417 04/10/21 1048 04/12/21 1019 04/25/21 0815  BILITOT 0.6 0.7 0.5 0.5  AST 24  22 30 35  ALT 40 34 34 32  ALKPHOS 79 73 81 93  PROT 6.2* 6.4* 6.9 7.3  ALBUMIN 2.7* 2.8* 2.9* 2.6*    TUMOR MARKERS: No results for input(s): AFPTM, CEA, CA199, CHROMGRNA in the last 8760 hours.  Assessment and Plan: 60 y.o. 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  She presents today for Port-A-Cath placement to assist with treatment.Risks and benefits of image guided port-a-catheter placement was discussed with the patient including, but not limited to bleeding, infection, pneumothorax, or fibrin sheath development and need for additional procedures.  All of the patient's questions were answered, patient is agreeable to proceed. Consent signed and in chart.    Thank you for this interesting consult.  I greatly enjoyed meeting Nichole Park  and look forward to participating in their care.  A copy of this report was sent to the requesting provider on this date.  Electronically Signed: D. Rowe Robert, PA-C 04/27/2021, 10:43 AM   I spent a total of 25 minutes    in face to face in clinical consultation, greater than 50% of which was counseling/coordinating care for port a cath placement

## 2021-04-27 NOTE — Telephone Encounter (Signed)
Spoke with pt's daughter at length regarding WBRT. [name] daughter stated that she spoke with the pt last night about it and remains undecided. Reassurance provided that radiation has advanced since pt's sister was treated 20+ years ago. All questions answered during call. Informed pt's daughter that will touch base with her again next week to follow up. [name] daughter verbalized understanding.

## 2021-04-30 ENCOUNTER — Other Ambulatory Visit: Payer: Self-pay

## 2021-05-01 ENCOUNTER — Encounter: Payer: Self-pay | Admitting: Internal Medicine

## 2021-05-01 ENCOUNTER — Other Ambulatory Visit: Payer: Self-pay

## 2021-05-01 ENCOUNTER — Inpatient Hospital Stay: Payer: Medicaid Other

## 2021-05-01 ENCOUNTER — Telehealth: Payer: Self-pay | Admitting: Internal Medicine

## 2021-05-01 DIAGNOSIS — R0602 Shortness of breath: Secondary | ICD-10-CM

## 2021-05-01 DIAGNOSIS — C3490 Malignant neoplasm of unspecified part of unspecified bronchus or lung: Secondary | ICD-10-CM

## 2021-05-01 MED ORDER — LIDOCAINE-PRILOCAINE 2.5-2.5 % EX CREA
1.0000 "application " | TOPICAL_CREAM | CUTANEOUS | 0 refills | Status: DC | PRN
  Filled 2021-05-01: qty 30, 30d supply, fill #0

## 2021-05-01 NOTE — Progress Notes (Signed)
Nutrition Assessment   Reason for Assessment:  Referral from Dr B, malnutrition   ASSESSMENT:  61 year old female with stage IV lung adenocarcinoma with brain lesions.  Past medical history of Graves disease, afib.  Patient receiving carboplatin, alimta, keytruda.  Patient to decide regarding whole brain radiation.   Spoke with patient via phone.  Patient reports that her appetite is good.  Reports that she is eating organic foods.  Does not eat red meat (beef and pork).  Denies nausea, vomiting, diarrhea. Patient is having trouble with constipation and is taking  miralax which helps.  Reports that breakfast is usually eggs, grits, potatoes, fruit.  Lunch is banana and peanut butter sandwiches. Supper is salad with salmon or fish and vegetable and beans.  Likes nutty buddy ice cream.  Drinks orgain shakes but not daily.   Medications: compazine, zofran, dexamethasone   Labs: reviewed   Anthropometrics:   Height: 61 inches Weight: 124 lb on 8/17 UBW: 124 lb (128 lb on 8/8, hospital weight, ? Fluids) BMI: 23   Estimated Energy Needs  Kcals: 1680-1900 Protein:  84-95 g Fluid: 1.6 L   NUTRITION DIAGNOSIS: Increased nutrient needs related to cancer as evidenced by estimated needs   INTERVENTION:  Discussed importance of good nutrition and weight maintenance Discussed strategies to help with constipation. Provided examples of foods other than beef and pork that are high in protein. Encouraged protein food at every meal Encouraged orgain shake at least daily for added nutrition.   Contact information provided   MONITORING, EVALUATION, GOAL: weight trends, intake   Next Visit: Thursday, Sept 8 during infusion  Lindora Alviar B. Zenia Resides, [place], [place] Registered Dietitian [phone] (mobile)

## 2021-05-01 NOTE — Telephone Encounter (Signed)
EMLA cream sent into pharmacy.   Dr. Jacinto Reap- please advise on pt's shortness of breath.

## 2021-05-01 NOTE — Telephone Encounter (Signed)
[name] daughter, Summer, made aware. Reviewed appt in [facility] on Wed 8/24 at 2:15pm. Instructed to go to MM prior to appt for chest xray. Summer verbalized understanding.

## 2021-05-01 NOTE — Addendum Note (Signed)
Addended by: Telford Nab on: 05/01/2021 03:53 PM   Modules accepted: Orders

## 2021-05-01 NOTE — Telephone Encounter (Signed)
Nichole Park - will you reach out to daughter to discuss

## 2021-05-01 NOTE — Addendum Note (Signed)
Addended by: Telford Nab on: 05/01/2021 02:08 PM   Modules accepted: Orders

## 2021-05-01 NOTE — Telephone Encounter (Signed)
Patient's daughter called and requested a cream for the pain/discomfort around the patient's port.   Daughter would also like the clinical team to be aware of patient's increasing shortness of breath (she stated she has at baseline but seems to be worse).

## 2021-05-01 NOTE — Telephone Encounter (Signed)
Nichole Park- pt will need apt in [facility] tom. With chest xray prior

## 2021-05-02 ENCOUNTER — Other Ambulatory Visit: Payer: Self-pay

## 2021-05-02 ENCOUNTER — Inpatient Hospital Stay (HOSPITAL_BASED_OUTPATIENT_CLINIC_OR_DEPARTMENT_OTHER): Payer: Medicaid Other | Admitting: Hospice and Palliative Medicine

## 2021-05-02 ENCOUNTER — Ambulatory Visit
Admission: RE | Admit: 2021-05-02 | Discharge: 2021-05-02 | Disposition: A | Discharge status: Home / Self Care | Payer: Medicaid Other | Attending: Internal Medicine | Admitting: Internal Medicine

## 2021-05-02 ENCOUNTER — Ambulatory Visit
Admission: RE | Admit: 2021-05-02 | Discharge: 2021-05-02 | Disposition: A | Discharge status: Home / Self Care | Payer: Medicaid Other | Source: Ambulatory Visit | Attending: Internal Medicine | Admitting: Internal Medicine

## 2021-05-02 VITALS — BP 111/73 | HR 96 | Resp 18 | Wt 123.0 lb

## 2021-05-02 DIAGNOSIS — C3490 Malignant neoplasm of unspecified part of unspecified bronchus or lung: Secondary | ICD-10-CM | POA: Diagnosis present

## 2021-05-02 DIAGNOSIS — Z5112 Encounter for antineoplastic immunotherapy: Secondary | ICD-10-CM | POA: Diagnosis not present

## 2021-05-02 DIAGNOSIS — R0602 Shortness of breath: Secondary | ICD-10-CM | POA: Diagnosis not present

## 2021-05-02 NOTE — Progress Notes (Signed)
Patient reports worsening SOB since yesterday and sleeping a lot.

## 2021-05-02 NOTE — Progress Notes (Addendum)
Symptom Management [place]  [phone] [phone]  Patient Care Team: Practice, [facility] (Inactive) as PCP - General End, Harrell Gave, MD as PCP - Cardiology (Cardiology) Telford Nab, RN as Oncology Nurse Navigator   Name of the patient: Nichole Park  [id number]  [birth year]   Date of visit: 05/02/21  Reason for Consult:  Nichole Park is a 60 year old woman with multiple medical problems including remote history of Graves' disease and tobacco abuse who was hospitalized 03/27/2021 to 04/09/2021 with respiratory failure and was found to have a large right upper lobar mass with lymphangitic spread and pericardial effusion with tamponade requiring pericardial drain.  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  Hospitalization was complicated by paroxysmal atrial fibrillation with intermittent RVR.  Patient was started on Cardizem and amiodarone.  Patient was started on propylthiouricil as it was felt that her Graves' disease could be a potential contributing factor to the A. fib RVR. Patient developed oral candidiasis and was started on nystatin and eventually required oral fluconazole.  Patient presents to [facility] today for evaluation of shortness of breath.  Patient says that breathing has been slightly worse over the last 24 to 48 hours.  However, she says mostly she is at baseline.  Shortness of breath is primarily with exertion.  She denies fever or chills.  No wheezing.  She had slight runny nose but no postnasal drip, sinus pressure, or sore throat.  Patient says that her bedroom has a significant mold burden and patient attributes this to her shortness of breath.  Patient says that when she walks outside of the home her breathing quickly improves with fresh air.  Denies any neurologic complaints. Denies recent fevers or illnesses. Denies any easy bleeding or bruising. Reports good appetite and denies weight loss.  Denies chest pain.  Patient had constipation but this is resolved.  Denies urinary complaints. Patient offers no further specific complaints today.  PAST MEDICAL HISTORY: Past Medical History:  Diagnosis Date   Graves disease    Malignant pericardial effusion (HCC)    Paroxysmal atrial fibrillation ([place])    Primary lung adenocarcinoma ([place])     PAST SURGICAL HISTORY:  Past Surgical History:  Procedure Laterality Date   IR IMAGING GUIDED PORT INSERTION  04/27/2021   PERICARDIOCENTESIS N/A 03/27/2021   Procedure: PERICARDIOCENTESIS;  Surgeon: Nelva Bush, MD;  Location: [facility];  Service: Cardiovascular;  Laterality: N/A;   TUBAL LIGATION      HEMATOLOGY/ONCOLOGY HISTORY:  Oncology History Overview Note  # AUG 2022-stage IV adenocarcinoma of the lung [s/p pericardial effusion/tamponade].    # AUG 17th 2022- CARBO-ALIMTA-Keytruda #1  #Acute respiratory failure BiPAP/ICU.  S/p pericardiocentesis    Malignant pericardial effusion (HCC)  03/28/2021 Initial Diagnosis   Malignant pericardial effusion ([place])   04/25/2021 -  Chemotherapy    Patient is on Treatment Plan: LUNG CARBOPLATIN / PEMETREXED / PEMBROLIZUMAB Q21D INDUCTION X 4 CYCLES / MAINTENANCE PEMETREXED + PEMBROLIZUMAB      Cancer of upper lobe of right lung ([place])  04/10/2021 Initial Diagnosis   Cancer of upper lobe of right lung ([place])   04/10/2021 Cancer Staging   Staging form: Lung, AJCC 8th Edition - Clinical: Stage IVA (cT2, cN2, cM1a) - Signed by Cammie Sickle, MD on 04/10/2021     ALLERGIES:  is allergic to diazepam, metoprolol, and tapazole [methimazole].  MEDICATIONS:  Current Outpatient Medications  Medication Sig Dispense Refill   amiodarone (PACERONE) 200 MG tablet Take  1 tablet (200 mg total) by mouth once daily. 90 tablet 3   dexamethasone (DECADRON) 4 MG tablet Take 1 tablet by mouth in the morning and evening 2 days. Take the day prior and the day after chemo. DO NOT take on the  day of chemo. 60 tablet 0   diltiazem (DILACOR XR) 240 MG 24 hr capsule Take 1 capsule (240 mg total) by mouth once daily. 90 capsule 3   folic acid (FOLVITE) 1 MG tablet Take 1 tablet (1 mg total) by mouth once daily. 90 tablet 1   Multiple Vitamin (MULTI-VITAMINS) TABS Take 1 tablet by mouth daily.     albuterol (VENTOLIN HFA) 108 (90 Base) MCG/ACT inhaler Inhale 2 puffs into the lungs every 6 (six) hours as needed for wheezing or shortness of breath. (Patient not taking: No sig reported) 8.5 g 0   fluconazole (DIFLUCAN) 100 MG tablet Take 2 tablets (200mg  total) by mouth on Day 1. Then take 1 tablet (100mg ) by mouth once daily thereafter. (Patient not taking: Reported on 05/02/2021) 15 tablet 0   lidocaine (XYLOCAINE) 2 % solution Use as directed. Swish and spit 15 mLs in the mouth or throat once every 6 (six) hours as needed for mouth pain. (Patient not taking: No sig reported) 240 mL 0   lidocaine-prilocaine (EMLA) cream Apply 1 application topically daily as needed. (Patient not taking: Reported on 05/02/2021) 30 g 0   nystatin (MYCOSTATIN) 100000 UNIT/ML suspension Take 5 mLs (500,000 Units total) by mouth 4 (four) times daily. (Patient not taking: No sig reported) 60 mL 0   ondansetron (ZOFRAN) 4 MG tablet Take 2 tablets (8mg  total) by mouth once every 8 hours as needed for nausea and vomiting. (Patient not taking: No sig reported) 80 tablet 1   prochlorperazine (COMPAZINE) 10 MG tablet Take 1 tablet (10 mg total) by mouth once every 6 (six) hours as needed for nausea or vomiting. (Patient not taking: No sig reported) 40 tablet 1   No current facility-administered medications for this visit.    VITAL SIGNS: BP 111/73 (BP Location: Left Arm, Patient Position: Sitting)   Pulse 96   Resp 18   Wt 123 lb (55.8 kg)   SpO2 94%   BMI 23.24 kg/m  Filed Weights   05/02/21 1521  Weight: 123 lb (55.8 kg)    Estimated body mass index is 23.24 kg/m as calculated from the following:   Height as  of 04/25/21: 5\' 1"  (1.549 m).   Weight as of this encounter: 123 lb (55.8 kg).  LABS: CBC:    Component Value Date/Time   WBC 19.6 (H) 04/25/2021 0815   HGB 13.4 04/25/2021 0815   HGB 15.9 12/01/2013 1440   HCT 41.6 04/25/2021 0815   HCT 46.8 12/01/2013 1440   PLT 581 (H) 04/25/2021 0815   PLT 236 12/01/2013 1440   MCV 87.4 04/25/2021 0815   MCV 87 12/01/2013 1440   NEUTROABS 17.7 (H) 04/25/2021 0815   LYMPHSABS 1.5 04/25/2021 0815   MONOABS 0.2 04/25/2021 0815   EOSABS 0.0 04/25/2021 0815   BASOSABS 0.0 04/25/2021 0815   Comprehensive Metabolic Panel:    Component Value Date/Time   NA 136 04/25/2021 0815   NA 137 12/01/2013 1440   K 4.8 04/25/2021 0815   K 3.7 12/01/2013 1440   CL 100 04/25/2021 0815   CL 108 (H) 12/01/2013 1440   CO2 23 04/25/2021 0815   CO2 25 12/01/2013 1440   BUN 11 04/25/2021 0815  BUN 8 12/01/2013 1440   CREATININE 0.64 04/25/2021 0815   CREATININE 0.43 (L) 12/01/2013 1440   GLUCOSE 299 (H) 04/25/2021 0815   GLUCOSE 117 (H) 12/01/2013 1440   CALCIUM 9.4 04/25/2021 0815   CALCIUM 8.8 12/01/2013 1440   AST 35 04/25/2021 0815   AST 35 12/01/2013 1440   ALT 32 04/25/2021 0815   ALT 36 12/01/2013 1440   ALKPHOS 93 04/25/2021 0815   ALKPHOS 327 (H) 12/01/2013 1440   BILITOT 0.5 04/25/2021 0815   BILITOT 0.4 12/01/2013 1440   PROT 7.3 04/25/2021 0815   PROT 7.8 12/01/2013 1440   ALBUMIN 2.6 (L) 04/25/2021 0815   ALBUMIN 3.7 12/01/2013 1440    RADIOGRAPHIC STUDIES: DG Chest 2 View  Result Date: 05/02/2021 CLINICAL DATA:  Shortness of breath, history of lung cancer EXAM: CHEST - 2 VIEW COMPARISON:  Chest x-ray dated April 04, 2021 FINDINGS: Interval placement of right chest wall port with tip position near the superior cavoatrial junction. Mediastinal contours are within normal limits. Unchanged right upper lobe mass. Findings compatible with underlying emphysema. Mild bilateral interstitial opacities. No focal consolidations. No pleural  effusion or pneumothorax. IMPRESSION: Unchanged right upper lobe mass. Mild bilateral interstitial opacities, possibly due to pulmonary edema. Electronically Signed   By: Yetta Glassman M.D.   On: 05/02/2021 15:23   MR Brain W Wo Contrast  Result Date: 04/25/2021 CLINICAL DATA:  Lung cancer staging EXAM: MRI HEAD WITHOUT AND WITH CONTRAST TECHNIQUE: Multiplanar, multiecho pulse sequences of the brain and surrounding structures were obtained without and with intravenous contrast. CONTRAST:  97mL GADAVIST GADOBUTROL 1 MMOL/ML IV SOLN COMPARISON:  None. FINDINGS: Brain: Multiple foci of increased T2 signal, the majority of which correlate with peripherally enhancing, centrally T1 hypointense lesions, throughout the bilateral cerebral hemispheres, concerning for metastatic disease. 7 x 6 mm lesion in the posterior right temporal lobe (series 18, image 69). 7 x 6 mm lesion in the left parietal lobe (series 18, image 108). 6 x 4 mm lesion in the left frontal lobe (series 18, image 121). Additional focus of increased T2 signal in the right parietal lobe (series 15, image 43), is without correlate on the T1 image and may represent a developing lesion. No acute infarct, hemorrhage, mass, mass effect, or midline shift. Normal ventricles and sulci. Vascular: Normal flow voids. Skull and upper cervical spine: Normal marrow signal. Sinuses/Orbits: Mucosal thickening in the maxillary sinuses, ethmoid air cells, right sphenoid sinus, and left greater than right frontal sinus. The orbits are unremarkable. Other: None. IMPRESSION: Multiple peripherally enhancing, centrally T1 hypointense lesions, with surrounding T2 hyperintense signal in the bilateral cerebral hemispheres, concerning for metastatic disease. Electronically Signed   By: Merilyn Baba M.D.   On: 04/25/2021 15:16   NM PET Image Initial (PI) Skull Base To Thigh  Result Date: 04/27/2021 CLINICAL DATA:  Initial treatment strategy for non-small cell lung cancer.  Intracranial metastatic lesions. EXAM: NUCLEAR MEDICINE PET SKULL BASE TO THIGH TECHNIQUE: 7.1 mCi F-18 FDG was injected intravenously. Full-ring PET imaging was performed from the skull base to thigh after the radiotracer. CT data was obtained and used for attenuation correction and anatomic localization. Fasting blood glucose: 120 mg/dl COMPARISON:  Multiple exams, including 03/27/2021 FINDINGS: Mediastinal blood pool activity: SUV max 1.9 Liver activity: SUV max 2.4 NECK: The patient's known intracranial lesions are subcentimeter and not readily appreciated on today's PET-CT possibly due to the small size. Bilateral V lymph nodes are present in the neck, with the left-sided level V  lymph node measuring 1.1 cm in short axis on image 61 series 3 with maximum SUV 6.7. Bilateral hypermetabolic left level IV lymph nodes include a left level IV lymph node on image 72 of series 3 measuring 0.9 cm with maximum SUV 7.0. A hypodense nodule within or along the right anterior thyroid lobe measures 0.9 by 0.8 cm on image 77 series 3 and has a maximum SUV of 5.3, substantially above the remainder of the thyroid gland. It is conceivable that this lesion is a lymph node immediately adjacent to the anterior thyroid although there is not a well-defined tissue plane. Incidental CT findings: Chronic ethmoid and bilateral maxillary sinusitis. Mild bilateral common carotid atherosclerotic calcification. CHEST: The dominant centrally necrotic right upper lobe mass measures 4.4 by 4.0 cm on image 94 series 3 with maximum SUV 10.2. 0.5 cm satellite nodule anterior to this mass on image 96 series 3 has maximum SUV of 2.7. Primarily in the right lower lobe but also inferiorly in the right upper lobe and in the right middle lobe, we demonstrate airway thickening with peribronchovascular nodularity and interstitial accentuation, worsened compared to 03/27/2021. This has some low-grade hypermetabolic activity for example measuring up to 3.5  in the right lower lobe, with possibilities including lymphangitic carcinomatosis versus infection. Peripheral subpleural bandlike opacity in the left upper lobe anteriorly is mildly hypermetabolic with a maximum SUV up to 2.7. Reticulonodular opacities anteriorly in the left lower lobe have maximum SUV up to 1.7. There is hypermetabolic left supraclavicular, left subpectoral, left axillary, right paratracheal, left paratracheal, subcarinal, and paraesophageal adenopathy. Index left subpectoral lymph node 1.0 cm in short axis on image 88 series 3, maximum SUV 3.7. Index right lower paratracheal lymph node 0.9 cm in short axis on image 101 series 3, maximum SUV 3.5. Subcarinal lymph nodes maximum SUV 5.0. Incidental CT findings: Severe emphysema. Atherosclerotic calcification of the thoracic aorta and branch vessels. Mild cardiomegaly. Marked improvement in the prior pericardial effusion. ABDOMEN/PELVIS: No significant abnormal hypermetabolic activity in this region. Incidental CT findings: Aortoiliac atherosclerotic vascular disease. Sigmoid colon diverticulosis. SKELETON: No significant abnormal hypermetabolic activity in this region. Incidental CT findings: none IMPRESSION: 1. Centrally necrotic hypermetabolic right upper lobe mass with maximum SUV 10.2. Adjacent 0.5 cm satellite nodule anterior to the mass has a maximum SUV of 2.7. 2. Mildly hypermetabolic mediastinal, left subpectoral, left axillary, paratracheal, and lower neck level IV and V adenopathy, suspicious for metastatic spread. 3. A 9 by 8 mm hypermetabolic lesion within or along the right anterior thyroid lobe has maximum SUV of 5.3. This could be an immediately adjacent lymph node or possibly a hypermetabolic thyroid nodule. Recommend thyroid US and biopsy (ref: J Am Coll Radiol. 2015 Feb;12(2): 143-50). 4. Substantially progressive airway thickening and peribronchovascular nodularity with interstitial accentuation especially in the right lower lobe  and to a lesser degree in portions of the right upper lobe and right middle lobe. Possibilities include infection and lymphangitic carcinomatosis. Associated low-grade hypermetabolic activity in the right lower lobe measures up to 3.5. 5. Subpleural bandlike opacity in the left upper lobe is not characteristic for malignancy and has a maximum SUV of up to 2.7, likely inflammatory. 6. The patient's known intracranial lesions are sub-centimeter in size and not readily appreciated on today's PET-CT. 7. Other imaging findings of potential clinical significance: Aortic Atherosclerosis (ICD10-I70.0) and Emphysema (ICD10-J43.9). Mild cardiomegaly. Marked improvement in prior pericardial effusion. Sigmoid colon diverticulosis. Chronic ethmoid and bilateral maxillary sinusitis. Electronically Signed   By: Cindra Eves.D.  On: 04/27/2021 13:51   DG Chest Port 1 View  Result Date: 04/04/2021 CLINICAL DATA:  Respiratory failure. EXAM: PORTABLE CHEST 1 VIEW COMPARISON:  04/01/2021.  CT 03/27/2021. FINDINGS: Mediastinum and hilar structures are stable. Borderline cardiomegaly. No pulmonary venous congestion. Persistent large mass right upper lung again noted. Diffuse bilateral interstitial prominence again noted. Although interstitial edema and/or pneumonitis could present this fashion, lymphangitic tumor spread again cannot be excluded. Bibasilar atelectasis. Right base infiltrate. Right base pneumonia cannot be excluded. Small bilateral pleural effusions again noted. No acute bony abnormality identified. IMPRESSION: 1.  Large right upper lung mass again noted without interim change. 2. Diffuse bilateral interstitial prominence again noted. Although interstitial edema and/or pneumonitis could present in this fashion, lymphangitic tumor spread again cannot be excluded. 3. Bibasilar atelectasis. Right base infiltrate. Right base pneumonia cannot be excluded. Small bilateral pleural effusions again noted. 4.   Borderline cardiomegaly. Electronically Signed   By: Marcello Moores  Register   On: 04/04/2021 07:53   IR IMAGING GUIDED PORT INSERTION  Result Date: 04/27/2021 INDICATION: Chemotherapy access, lung cancer EXAM: IMPLANTED PORT A CATH PLACEMENT WITH ULTRASOUND AND FLUOROSCOPIC GUIDANCE MEDICATIONS: None ANESTHESIA/SEDATION: Moderate (conscious) sedation was employed during this procedure. A total of Versed 2 mg and Fentanyl 100 mcg was administered intravenously. Moderate Sedation Time: 30 minutes. The patient's level of consciousness and vital signs were monitored continuously by radiology nursing throughout the procedure under my direct supervision. FLUOROSCOPY TIME:  0 minutes, 24 seconds (1.5 mGy) COMPLICATIONS: None immediate. PROCEDURE: The procedure, risks, benefits, and alternatives were explained to the patient. Questions regarding the procedure were encouraged and answered. The patient understands and consents to the procedure. The patient was placed supine on the exam table. The right neck and chest was prepped and draped in the standard sterile fashion. A preliminary ultrasound of the right neck was performed and demonstrates a patent right internal jugular vein. A permanent ultrasound image was stored. The overlying skin was anesthetized with 1% Lidocaine. Using ultrasound guidance, access was obtained into the right internal jugular vein using a 21 gauge micropuncture set. A wire was advanced into the SVC, a short incision was made at the puncture site, and serial dilatation performed. Next, in an ipsilateral infraclavicular location, an incision was made at the site of the subcutaneous reservoir. Blunt dissection was used to open a pocket to contain the reservoir. A subcutaneous tunnel was then created from the port site to the puncture site. A(n) 8 Fr single lumen catheter was advanced through the tunnel. The catheter was attached to the port and this was placed in the subcutaneous pocket. Under  fluoroscopic guidance, a peel away sheath was placed, and the catheter was trimmed to the appropriate length and was advanced into the central veins. The catheter length is 21 cm. The tip of the catheter lies near the superior cavoatrial junction. The port flushes and aspirates appropriately. The port was flushed and locked with heparinized saline. The port pocket was closed in 2 layers using 3-0 and 4-0 Vicryl/absorbable suture. Dermabond was also applied to both incisions. The patient tolerated the procedure well and was transferred to recovery in stable condition. IMPRESSION: Successful placement of a right chest port using the right internal jugular vein. The port is ready for immediate use. Electronically Signed   By: Albin Felling M.D.   On: 04/27/2021 14:00    PERFORMANCE STATUS (ECOG) : 2 - Symptomatic, <50% confined to bed  Review of Systems Unless otherwise noted, a complete review of systems  is negative.  Physical Exam General: NAD Cardiovascular: regular rate and rhythm Pulmonary: clear anterior/posterior fields Abdomen: soft, nontender, + bowel sounds GU: no suprapubic tenderness Extremities: no edema, no joint deformities Skin: No rashes Neurological: Weakness but otherwise nonfocal  Assessment and Plan- Patient is a 61 y.o. female with multiple medical problems including stage IV adenocarcinoma of the lung, Graves' disease, and A. fib/RVR, who presents to [facility] for evaluation of shortness of breath   Shortness of breath-chest x-ray essentially unchanged.  Patient does have bilateral opacities, which likely reflect her lymphangitic spread vs edema.  Patient appears clinically unchanged today.  She is nontoxic-appearing.  She is clear to auscultation.  Suspect that her shortness of breath is secondary to lung cancer with lymphangitic spread.  However, environmental exposure to mold certainly could exacerbate symptoms.  Patient is trying to remodel the home.  Discussed techniques to  manage shortness of breath.  Would recommend energy conservation.  Suggested that we could try steroids and/or low-dose opioids the patient declined both.  Will start on low dose Lasix daily as needed. She has albuterol at home but says that she is reluctant to take it.  Suggested that she buy a pulse oximeter and monitor SaO2.  Patient verbalized comfort with just monitoring symptoms for now.  She will return to [facility] on Friday for fluids.  Can see in [facility] anytime if needed.  Also discussed triggers for ER utilization if needed.  Case and plan discussed with Dr. Rogue Bussing  Patient expressed understanding and was in agreement with this plan. She also understands that She can call clinic at any time with any questions, concerns, or complaints.   Thank you for allowing me to participate in the care of this very pleasant patient.   Time Total: 25 minutes  Visit consisted of counseling and education dealing with the complex and emotionally intense issues of symptom management and palliative care in the setting of serious and potentially life-threatening illness.Greater than 50%  of this time was spent counseling and coordinating care related to the above assessment and plan.  Signed by: Altha Harm, PhD, NP-C

## 2021-05-03 ENCOUNTER — Encounter: Payer: Self-pay | Admitting: Internal Medicine

## 2021-05-03 ENCOUNTER — Other Ambulatory Visit: Payer: Self-pay

## 2021-05-03 ENCOUNTER — Telehealth: Payer: Self-pay | Admitting: Hospice and Palliative Medicine

## 2021-05-03 MED ORDER — FUROSEMIDE 20 MG PO TABS
20.0000 mg | ORAL_TABLET | Freq: Every day | ORAL | 0 refills | Status: DC
  Filled 2021-05-03: qty 30, 30d supply, fill #0

## 2021-05-03 MED ORDER — FUROSEMIDE 20 MG PO TABS
20.0000 mg | ORAL_TABLET | Freq: Every day | ORAL | 0 refills | Status: DC | PRN
  Filled 2021-05-03: qty 15, 15d supply, fill #0

## 2021-05-03 NOTE — Telephone Encounter (Signed)
I called and spoke with patient by phone.  She reports no significant changes today.  Breathing is no better or worse.  We will try low-dose Lasix as needed to see if that improves symptoms.  However, patient said that she would try it and reluctantly as she did not like the frequent urination that it caused when she was hospitalized.  Patient says that she came to the clinic really at the insistence of her daughter that she feels "fine."

## 2021-05-03 NOTE — Addendum Note (Signed)
Addended by: Irean Hong on: 05/03/2021 08:45 AM   Modules accepted: Orders

## 2021-05-04 ENCOUNTER — Other Ambulatory Visit: Payer: Self-pay

## 2021-05-04 ENCOUNTER — Inpatient Hospital Stay: Payer: Medicaid Other

## 2021-05-04 DIAGNOSIS — Z5112 Encounter for antineoplastic immunotherapy: Secondary | ICD-10-CM | POA: Diagnosis not present

## 2021-05-04 DIAGNOSIS — C3411 Malignant neoplasm of upper lobe, right bronchus or lung: Secondary | ICD-10-CM

## 2021-05-04 DIAGNOSIS — Z95828 Presence of other vascular implants and grafts: Secondary | ICD-10-CM

## 2021-05-04 LAB — COMPREHENSIVE METABOLIC PANEL
ALT: 41 U/L (ref 0–44)
AST: 36 U/L (ref 15–41)
Albumin: 2.4 g/dL — ABNORMAL LOW (ref 3.5–5.0)
Alkaline Phosphatase: 97 U/L (ref 38–126)
Anion gap: 9 (ref 5–15)
BUN: 11 mg/dL (ref 6–20)
CO2: 27 mmol/L (ref 22–32)
Calcium: 9.1 mg/dL (ref 8.9–10.3)
Chloride: 97 mmol/L — ABNORMAL LOW (ref 98–111)
Creatinine, Ser: 0.45 mg/dL (ref 0.44–1.00)
GFR, Estimated: >60 mL/min (ref >60)
Glucose, Bld: 141 mg/dL — ABNORMAL HIGH (ref 70–99)
Potassium: 3.8 mmol/L (ref 3.5–5.1)
Sodium: 133 mmol/L — ABNORMAL LOW (ref 135–145)
Total Bilirubin: 0.3 mg/dL (ref 0.3–1.2)
Total Protein: 6.6 g/dL (ref 6.5–8.1)

## 2021-05-04 LAB — CBC WITH DIFFERENTIAL/PLATELET
Abs Immature Granulocytes: 0.03 10*3/uL (ref 0.00–0.07)
Basophils Absolute: 0 10*3/uL (ref 0.0–0.1)
Basophils Relative: 1 %
Eosinophils Absolute: 0 10*3/uL (ref 0.0–0.5)
Eosinophils Relative: 0 %
HCT: 37.7 % (ref 36.0–46.0)
Hemoglobin: 12.5 g/dL (ref 12.0–15.0)
Immature Granulocytes: 1 %
Lymphocytes Relative: 41 %
Lymphs Abs: 1.6 10*3/uL (ref 0.7–4.0)
MCH: 28 pg (ref 26.0–34.0)
MCHC: 33.2 g/dL (ref 30.0–36.0)
MCV: 84.5 fL (ref 80.0–100.0)
Monocytes Absolute: 0.9 10*3/uL (ref 0.1–1.0)
Monocytes Relative: 24 %
Neutro Abs: 1.3 10*3/uL — ABNORMAL LOW (ref 1.7–7.7)
Neutrophils Relative %: 33 %
Platelets: 179 10*3/uL (ref 150–400)
RBC: 4.46 MIL/uL (ref 3.87–5.11)
RDW: 13 % (ref 11.5–15.5)
WBC: 3.9 10*3/uL — ABNORMAL LOW (ref 4.0–10.5)
nRBC: 0 % (ref 0.0–0.2)

## 2021-05-04 MED ORDER — HEPARIN SOD (PORK) LOCK FLUSH 100 UNIT/ML IV SOLN
500.0000 [IU] | Freq: Once | INTRAVENOUS | Status: DC
  Filled 2021-05-04: qty 5

## 2021-05-04 NOTE — Progress Notes (Signed)
Labs reviewed by Dr. Rogue Bussing. No fluids needed today, per MD.

## 2021-05-10 ENCOUNTER — Other Ambulatory Visit: Payer: Self-pay

## 2021-05-10 LAB — ACID FAST CULTURE WITH REFLEXED SENSITIVITIES (MYCOBACTERIA): Acid Fast Culture: NEGATIVE

## 2021-05-14 ENCOUNTER — Encounter: Payer: Self-pay | Admitting: Internal Medicine

## 2021-05-15 ENCOUNTER — Other Ambulatory Visit: Payer: Self-pay

## 2021-05-15 ENCOUNTER — Encounter: Payer: Self-pay | Admitting: Internal Medicine

## 2021-05-17 ENCOUNTER — Encounter: Payer: Self-pay | Admitting: Internal Medicine

## 2021-05-17 ENCOUNTER — Inpatient Hospital Stay (HOSPITAL_BASED_OUTPATIENT_CLINIC_OR_DEPARTMENT_OTHER): Payer: Medicaid Other | Admitting: Internal Medicine

## 2021-05-17 ENCOUNTER — Inpatient Hospital Stay: Payer: Medicaid Other

## 2021-05-17 ENCOUNTER — Other Ambulatory Visit: Payer: Self-pay

## 2021-05-17 ENCOUNTER — Inpatient Hospital Stay: Payer: Medicaid Other | Attending: Internal Medicine

## 2021-05-17 DIAGNOSIS — C3411 Malignant neoplasm of upper lobe, right bronchus or lung: Secondary | ICD-10-CM | POA: Insufficient documentation

## 2021-05-17 DIAGNOSIS — B3781 Candidal esophagitis: Secondary | ICD-10-CM

## 2021-05-17 DIAGNOSIS — J449 Chronic obstructive pulmonary disease, unspecified: Secondary | ICD-10-CM | POA: Insufficient documentation

## 2021-05-17 DIAGNOSIS — R7303 Prediabetes: Secondary | ICD-10-CM | POA: Insufficient documentation

## 2021-05-17 DIAGNOSIS — I313 Pericardial effusion (noninflammatory): Secondary | ICD-10-CM | POA: Diagnosis not present

## 2021-05-17 DIAGNOSIS — I480 Paroxysmal atrial fibrillation: Secondary | ICD-10-CM | POA: Insufficient documentation

## 2021-05-17 DIAGNOSIS — B370 Candidal stomatitis: Secondary | ICD-10-CM

## 2021-05-17 DIAGNOSIS — Z5111 Encounter for antineoplastic chemotherapy: Secondary | ICD-10-CM | POA: Insufficient documentation

## 2021-05-17 DIAGNOSIS — I3131 Malignant pericardial effusion in diseases classified elsewhere: Secondary | ICD-10-CM

## 2021-05-17 DIAGNOSIS — Z5112 Encounter for antineoplastic immunotherapy: Secondary | ICD-10-CM | POA: Insufficient documentation

## 2021-05-17 DIAGNOSIS — C801 Malignant (primary) neoplasm, unspecified: Secondary | ICD-10-CM

## 2021-05-17 LAB — CBC WITH DIFFERENTIAL/PLATELET
Abs Immature Granulocytes: 1.53 10*3/uL — ABNORMAL HIGH (ref 0.00–0.07)
Basophils Absolute: 0.2 10*3/uL — ABNORMAL HIGH (ref 0.0–0.1)
Basophils Relative: 1 %
Eosinophils Absolute: 0 10*3/uL (ref 0.0–0.5)
Eosinophils Relative: 0 %
HCT: 38.6 % (ref 36.0–46.0)
Hemoglobin: 12.9 g/dL (ref 12.0–15.0)
Immature Granulocytes: 5 %
Lymphocytes Relative: 9 %
Lymphs Abs: 2.8 10*3/uL (ref 0.7–4.0)
MCH: 29 pg (ref 26.0–34.0)
MCHC: 33.4 g/dL (ref 30.0–36.0)
MCV: 86.7 fL (ref 80.0–100.0)
Monocytes Absolute: 0.7 10*3/uL (ref 0.1–1.0)
Monocytes Relative: 2 %
Neutro Abs: 25.3 10*3/uL — ABNORMAL HIGH (ref 1.7–7.7)
Neutrophils Relative %: 83 %
Platelets: 620 10*3/uL — ABNORMAL HIGH (ref 150–400)
RBC: 4.45 MIL/uL (ref 3.87–5.11)
RDW: 15.3 % (ref 11.5–15.5)
WBC: 30.5 10*3/uL — ABNORMAL HIGH (ref 4.0–10.5)
nRBC: 0.1 % (ref 0.0–0.2)

## 2021-05-17 LAB — BASIC METABOLIC PANEL
Anion gap: 11 (ref 5–15)
BUN: 13 mg/dL (ref 6–20)
CO2: 24 mmol/L (ref 22–32)
Calcium: 9.4 mg/dL (ref 8.9–10.3)
Chloride: 99 mmol/L (ref 98–111)
Creatinine, Ser: 0.56 mg/dL (ref 0.44–1.00)
GFR, Estimated: >60 mL/min (ref >60)
Glucose, Bld: 326 mg/dL — ABNORMAL HIGH (ref 70–99)
Potassium: 4.5 mmol/L (ref 3.5–5.1)
Sodium: 134 mmol/L — ABNORMAL LOW (ref 135–145)

## 2021-05-17 MED ORDER — SODIUM CHLORIDE 0.9 % IV SOLN
Freq: Once | INTRAVENOUS | Status: AC
  Filled 2021-05-17: qty 250

## 2021-05-17 MED ORDER — NYSTATIN 100000 UNIT/ML MT SUSP
5.0000 mL | Freq: Three times a day (TID) | OROMUCOSAL | 0 refills | Status: DC | PRN
  Filled 2021-05-17: qty 150, 10d supply, fill #0

## 2021-05-17 MED ORDER — SODIUM CHLORIDE 0.9 % IV SOLN
200.0000 mg | Freq: Once | INTRAVENOUS | Status: AC
  Administered 2021-05-17: 200 mg via INTRAVENOUS
  Filled 2021-05-17: qty 8

## 2021-05-17 MED ORDER — SODIUM CHLORIDE 0.9 % IV SOLN
150.0000 mg | Freq: Once | INTRAVENOUS | Status: AC
  Administered 2021-05-17: 150 mg via INTRAVENOUS
  Filled 2021-05-17: qty 150

## 2021-05-17 MED ORDER — SODIUM CHLORIDE 0.9 % IV SOLN: 4.0000 mg | Freq: Once | INTRAVENOUS | Status: DC

## 2021-05-17 MED ORDER — HEPARIN SOD (PORK) LOCK FLUSH 100 UNIT/ML IV SOLN
INTRAVENOUS | Status: AC
  Administered 2021-05-17: 500 [IU]
  Filled 2021-05-17: qty 5

## 2021-05-17 MED ORDER — PALONOSETRON HCL INJECTION 0.25 MG/5ML
0.2500 mg | Freq: Once | INTRAVENOUS | Status: AC
  Administered 2021-05-17: 0.25 mg via INTRAVENOUS
  Filled 2021-05-17: qty 5

## 2021-05-17 MED ORDER — SODIUM CHLORIDE 0.9 % IV SOLN
500.0000 mg/m2 | Freq: Once | INTRAVENOUS | Status: AC
  Administered 2021-05-17: 800 mg via INTRAVENOUS
  Filled 2021-05-17: qty 20

## 2021-05-17 MED ORDER — DEXAMETHASONE SODIUM PHOSPHATE 10 MG/ML IJ SOLN
4.0000 mg | Freq: Once | INTRAMUSCULAR | Status: AC
  Administered 2021-05-17: 4 mg via INTRAVENOUS
  Filled 2021-05-17: qty 1

## 2021-05-17 MED ORDER — SODIUM CHLORIDE 0.9 % IV SOLN
465.0000 mg | Freq: Once | INTRAVENOUS | Status: AC
  Administered 2021-05-17: 470 mg via INTRAVENOUS
  Filled 2021-05-17: qty 47

## 2021-05-17 NOTE — Progress Notes (Signed)
Nutrition Follow-up:  Patient with stage IV lung adenocarcinoma with brain lesions.  Patient receiving carboplatin, alimta, keytruda.    Met with patient during infusion. Reports that appetite is great from recent steroids.  Says that she ate pizza last night, ate yogurt, breakfast bowl, fruit cup for breakfast. Eating peanut butter crackers at this time during visit.  Had chicken and rice recently for dinner.  Has been eating organic foods.  Says that she is trying to eat alkaline foods.  Patient drinking orgain shake daily    Medications: reviewed  Labs: glucose 326 steroid induced and MD making adjustments  Anthropometrics:   Weight 129 lb today increased  124 lb on 8/17   NUTRITION DIAGNOSIS: Increased nutrient needs continue   INTERVENTION:  Coupons given for orgain shakes Discussed alkaline diet with patient.  Foods unable to change ph of blood, can lead to malnutrition with certain restrictions, can be expensive.  Encouraged well balanced diet including colorful plant foods and lean animal products.      MONITORING, EVALUATION, GOAL: weight trends, intake   NEXT VISIT: Thursday, Sept 29 during infusion  Felesia Stahlecker B. Zenia Resides, [place], [place] Registered Dietitian [phone] (mobile)

## 2021-05-17 NOTE — Progress Notes (Signed)
Pt has questions regarding how treatment is evaluated for efficacy. Pt reports being able to breath better after having carpet removed d/t finding black mold in her house. Pt requesting refill lidocaine mouthwash and asking for antifungal medication d/t concerns of black mold in home.

## 2021-05-17 NOTE — Progress Notes (Signed)
Per Dr Rogue Bussing ok to proceed with BM results and HR 104.

## 2021-05-17 NOTE — Patient Instructions (Signed)
[facility]  Discharge Instructions: Thank you for choosing [facility] to provide your oncology and hematology care.  If you have a lab appointment with the [facility], please go directly to the [facility] and check in at the registration area.  Wear comfortable clothing and clothing appropriate for easy access to any Portacath or PICC line.   We strive to give you quality time with your provider. You may need to reschedule your appointment if you arrive late (15 or more minutes).  Arriving late affects you and other patients whose appointments are after yours.  Also, if you miss three or more appointments without notifying the office, you may be dismissed from the clinic at the provider's discretion.      For prescription refill requests, have your pharmacy contact our office and allow 72 hours for refills to be completed.    Today you received the following chemotherapy and/or immunotherapy agents KEYTRUDA, CARBOPLATIN, ALIMTA      To help prevent nausea and vomiting after your treatment, we encourage you to take your nausea medication as directed.  BELOW ARE SYMPTOMS THAT SHOULD BE REPORTED IMMEDIATELY: *FEVER GREATER THAN 100.4 F (38 C) OR HIGHER *CHILLS OR SWEATING *NAUSEA AND VOMITING THAT IS NOT CONTROLLED WITH YOUR NAUSEA MEDICATION *UNUSUAL SHORTNESS OF BREATH *UNUSUAL BRUISING OR BLEEDING *URINARY PROBLEMS (pain or burning when urinating, or frequent urination) *BOWEL PROBLEMS (unusual diarrhea, constipation, pain near the anus) TENDERNESS IN MOUTH AND THROAT WITH OR WITHOUT PRESENCE OF ULCERS (sore throat, sores in mouth, or a toothache) UNUSUAL RASH, SWELLING OR PAIN  UNUSUAL VAGINAL DISCHARGE OR ITCHING   Items with * indicate a potential emergency and should be followed up as soon as possible or go to the Emergency Department if any problems should occur.  Please show the CHEMOTHERAPY ALERT CARD or IMMUNOTHERAPY  ALERT CARD at check-in to the Emergency Department and triage nurse. Pembrolizumab injection What is this medication? PEMBROLIZUMAB (pem broe liz ue mab) is a monoclonal antibody. It is used to treat certain types of cancer. This medicine may be used for other purposes; ask your health care provider or pharmacist if you have questions. COMMON BRAND NAME(S): Keytruda What should I tell my care team before I take this medication? They need to know if you have any of these conditions: autoimmune diseases like Crohn's disease, ulcerative colitis, or lupus have had or planning to have an allogeneic stem cell transplant (uses someone else's stem cells) history of organ transplant history of chest radiation nervous system problems like myasthenia gravis or Guillain-Barre syndrome an unusual or allergic reaction to pembrolizumab, other medicines, foods, dyes, or preservatives pregnant or trying to get pregnant breast-feeding How should I use this medication? This medicine is for infusion into a vein. It is given by a health care professional in a hospital or clinic setting. A special MedGuide will be given to you before each treatment. Be sure to read this information carefully each time. Talk to your pediatrician regarding the use of this medicine in children. While this drug may be prescribed for children as young as 6 months for selected conditions, precautions do apply. Overdosage: If you think you have taken too much of this medicine contact a poison control center or emergency room at once. NOTE: This medicine is only for you. Do not share this medicine with others. What if I miss a dose? It is important not to miss your dose. Call your doctor or health care professional  if you are unable to keep an appointment. What may interact with this medication? Interactions have not been studied. This list may not describe all possible interactions. Give your health care provider a list of all the  medicines, herbs, non-prescription drugs, or dietary supplements you use. Also tell them if you smoke, drink alcohol, or use illegal drugs. Some items may interact with your medicine. What should I watch for while using this medication? Your condition will be monitored carefully while you are receiving this medicine. You may need blood work done while you are taking this medicine. Do not become pregnant while taking this medicine or for 4 months after stopping it. Women should inform their doctor if they wish to become pregnant or think they might be pregnant. There is a potential for serious side effects to an unborn child. Talk to your health care professional or pharmacist for more information. Do not breast-feed an infant while taking this medicine or for 4 months after the last dose. What side effects may I notice from receiving this medication? Side effects that you should report to your doctor or health care professional as soon as possible: allergic reactions like skin rash, itching or hives, swelling of the face, lips, or tongue bloody or black, tarry breathing problems changes in vision chest pain chills confusion constipation cough diarrhea dizziness or feeling faint or lightheaded fast or irregular heartbeat fever flushing joint pain low blood counts - this medicine may decrease the number of white blood cells, red blood cells and platelets. You may be at increased risk for infections and bleeding. muscle pain muscle weakness pain, tingling, numbness in the hands or feet persistent headache redness, blistering, peeling or loosening of the skin, including inside the mouth signs and symptoms of high blood sugar such as dizziness; dry mouth; dry skin; fruity breath; nausea; stomach pain; increased hunger or thirst; increased urination signs and symptoms of kidney injury like trouble passing urine or change in the amount of urine signs and symptoms of liver injury like dark  urine, light-colored stools, loss of appetite, nausea, right upper belly pain, yellowing of the eyes or skin sweating swollen lymph nodes weight loss Side effects that usually do not require medical attention (report to your doctor or health care professional if they continue or are bothersome): decreased appetite hair loss tiredness This list may not describe all possible side effects. Call your doctor for medical advice about side effects. You may report side effects to FDA at 1-800-FDA-1088. Where should I keep my medication? This drug is given in a hospital or clinic and will not be stored at home. NOTE: This sheet is a summary. It may not cover all possible information. If you have questions about this medicine, talk to your doctor, pharmacist, or health care provider.  2022 Elsevier/Gold Standard (2019-07-28 21:44:53)  Should you have questions after your visit or need to cancel or reschedule your appointment, please contact [facility]  [phone] and follow the prompts.  Office hours are 8:00 a.m. to 4:30 p.m. Monday - Friday. Please note that voicemails left after 4:00 p.m. may not be returned until the following business day.  We are closed weekends and major holidays. You have access to a nurse at all times for urgent questions. Please call the main number to the clinic [phone] and follow the prompts.  For any non-urgent questions, you may also contact your provider using MyChart. We now offer e-Visits for anyone 37 and older to request  care online for non-urgent symptoms. For details visit [email or web address].   Also download the MyChart app! Go to the app store, search "MyChart", open the app, select [facility], and log in with your MyChart username and password.  Due to Covid, a mask is required upon entering the hospital/clinic. If you do not have a mask, one will be given to you upon arrival. For doctor visits, patients may have 1  support person aged 88 or older with them. For treatment visits, patients cannot have anyone with them due to current Covid guidelines and our immunocompromised population.    Pemetrexed injection What is this medication? PEMETREXED (PEM e TREX ed) is a chemotherapy drug used to treat lung cancers like non-small cell lung cancer and mesothelioma. It may also be used to treat other cancers. This medicine may be used for other purposes; ask your health care provider or pharmacist if you have questions. COMMON BRAND NAME(S): Alimta, PEMFEXY What should I tell my care team before I take this medication? They need to know if you have any of these conditions: infection (especially a virus infection such as chickenpox, cold sores, or herpes) kidney disease low blood counts, like low white cell, platelet, or red cell counts lung or breathing disease, like asthma radiation therapy an unusual or allergic reaction to pemetrexed, other medicines, foods, dyes, or preservative pregnant or trying to get pregnant breast-feeding How should I use this medication? This drug is given as an infusion into a vein. It is administered in a hospital or clinic by a specially trained health care professional. Talk to your pediatrician regarding the use of this medicine in children. Special care may be needed. Overdosage: If you think you have taken too much of this medicine contact a poison control center or emergency room at once. NOTE: This medicine is only for you. Do not share this medicine with others. What if I miss a dose? It is important not to miss your dose. Call your doctor or health care professional if you are unable to keep an appointment. What may interact with this medication? This medicine may interact with the following medications: Ibuprofen This list may not describe all possible interactions. Give your health care provider a list of all the medicines, herbs, non-prescription drugs, or dietary  supplements you use. Also tell them if you smoke, drink alcohol, or use illegal drugs. Some items may interact with your medicine. What should I watch for while using this medication? Visit your doctor for checks on your progress. This drug may make you feel generally unwell. This is not uncommon, as chemotherapy can affect healthy cells as well as cancer cells. Report any side effects. Continue your course of treatment even though you feel ill unless your doctor tells you to stop. In some cases, you may be given additional medicines to help with side effects. Follow all directions for their use. Call your doctor or health care professional for advice if you get a fever, chills or sore throat, or other symptoms of a cold or flu. Do not treat yourself. This drug decreases your body's ability to fight infections. Try to avoid being around people who are sick. This medicine may increase your risk to bruise or bleed. Call your doctor or health care professional if you notice any unusual bleeding. Be careful brushing and flossing your teeth or using a toothpick because you may get an infection or bleed more easily. If you have any dental work done, tell your dentist you are receiving  this medicine. Avoid taking products that contain aspirin, acetaminophen, ibuprofen, naproxen, or ketoprofen unless instructed by your doctor. These medicines may hide a fever. Call your doctor or health care professional if you get diarrhea or mouth sores. Do not treat yourself. To protect your kidneys, drink water or other fluids as directed while you are taking this medicine. Do not become pregnant while taking this medicine or for 6 months after stopping it. Women should inform their doctor if they wish to become pregnant or think they might be pregnant. Men should not father a child while taking this medicine and for 3 months after stopping it. This may interfere with the ability to father a child. You should talk to your  doctor or health care professional if you are concerned about your fertility. There is a potential for serious side effects to an unborn child. Talk to your health care professional or pharmacist for more information. Do not breast-feed an infant while taking this medicine or for 1 week after stopping it. What side effects may I notice from receiving this medication? Side effects that you should report to your doctor or health care professional as soon as possible: allergic reactions like skin rash, itching or hives, swelling of the face, lips, or tongue breathing problems redness, blistering, peeling or loosening of the skin, including inside the mouth signs and symptoms of bleeding such as bloody or black, tarry stools; red or dark-brown urine; spitting up blood or brown material that looks like coffee grounds; red spots on the skin; unusual bruising or bleeding from the eye, gums, or nose signs and symptoms of infection like fever or chills; cough; sore throat; pain or trouble passing urine signs and symptoms of kidney injury like trouble passing urine or change in the amount of urine signs and symptoms of liver injury like dark yellow or brown urine; general ill feeling or flu-like symptoms; light-colored stools; loss of appetite; nausea; right upper belly pain; unusually weak or tired; yellowing of the eyes or skin Side effects that usually do not require medical attention (report to your doctor or health care professional if they continue or are bothersome): constipation mouth sores nausea, vomiting unusually weak or tired This list may not describe all possible side effects. Call your doctor for medical advice about side effects. You may report side effects to FDA at 1-800-FDA-1088. Where should I keep my medication? This drug is given in a hospital or clinic and will not be stored at home. NOTE: This sheet is a summary. It may not cover all possible information. If you have questions about  this medicine, talk to your doctor, pharmacist, or health care provider.  2022 Elsevier/Gold Standard (2017-10-15 16:11:33)   Carboplatin injection What is this medication? CARBOPLATIN (KAR boe pla tin) is a chemotherapy drug. It targets fast dividing cells, like cancer cells, and causes these cells to die. This medicine is used to treat ovarian cancer and many other cancers. This medicine may be used for other purposes; ask your health care provider or pharmacist if you have questions. COMMON BRAND NAME(S): Paraplatin What should I tell my care team before I take this medication? They need to know if you have any of these conditions: blood disorders hearing problems kidney disease recent or ongoing radiation therapy an unusual or allergic reaction to carboplatin, cisplatin, other chemotherapy, other medicines, foods, dyes, or preservatives pregnant or trying to get pregnant breast-feeding How should I use this medication? This drug is usually given as an infusion into  a vein. It is administered in a hospital or clinic by a specially trained health care professional. Talk to your pediatrician regarding the use of this medicine in children. Special care may be needed. Overdosage: If you think you have taken too much of this medicine contact a poison control center or emergency room at once. NOTE: This medicine is only for you. Do not share this medicine with others. What if I miss a dose? It is important not to miss a dose. Call your doctor or health care professional if you are unable to keep an appointment. What may interact with this medication? medicines for seizures medicines to increase blood counts like filgrastim, pegfilgrastim, sargramostim some antibiotics like amikacin, gentamicin, neomycin, streptomycin, tobramycin vaccines Talk to your doctor or health care professional before taking any of these medicines: acetaminophen aspirin ibuprofen ketoprofen naproxen This list  may not describe all possible interactions. Give your health care provider a list of all the medicines, herbs, non-prescription drugs, or dietary supplements you use. Also tell them if you smoke, drink alcohol, or use illegal drugs. Some items may interact with your medicine. What should I watch for while using this medication? Your condition will be monitored carefully while you are receiving this medicine. You will need important blood work done while you are taking this medicine. This drug may make you feel generally unwell. This is not uncommon, as chemotherapy can affect healthy cells as well as cancer cells. Report any side effects. Continue your course of treatment even though you feel ill unless your doctor tells you to stop. In some cases, you may be given additional medicines to help with side effects. Follow all directions for their use. Call your doctor or health care professional for advice if you get a fever, chills or sore throat, or other symptoms of a cold or flu. Do not treat yourself. This drug decreases your body's ability to fight infections. Try to avoid being around people who are sick. This medicine may increase your risk to bruise or bleed. Call your doctor or health care professional if you notice any unusual bleeding. Be careful brushing and flossing your teeth or using a toothpick because you may get an infection or bleed more easily. If you have any dental work done, tell your dentist you are receiving this medicine. Avoid taking products that contain aspirin, acetaminophen, ibuprofen, naproxen, or ketoprofen unless instructed by your doctor. These medicines may hide a fever. Do not become pregnant while taking this medicine. Women should inform their doctor if they wish to become pregnant or think they might be pregnant. There is a potential for serious side effects to an unborn child. Talk to your health care professional or pharmacist for more information. Do not breast-feed  an infant while taking this medicine. What side effects may I notice from receiving this medication? Side effects that you should report to your doctor or health care professional as soon as possible: allergic reactions like skin rash, itching or hives, swelling of the face, lips, or tongue signs of infection - fever or chills, cough, sore throat, pain or difficulty passing urine signs of decreased platelets or bleeding - bruising, pinpoint red spots on the skin, black, tarry stools, nosebleeds signs of decreased red blood cells - unusually weak or tired, fainting spells, lightheadedness breathing problems changes in hearing changes in vision chest pain high blood pressure low blood counts - This drug may decrease the number of white blood cells, red blood cells and platelets. You may  be at increased risk for infections and bleeding. nausea and vomiting pain, swelling, redness or irritation at the injection site pain, tingling, numbness in the hands or feet problems with balance, talking, walking trouble passing urine or change in the amount of urine Side effects that usually do not require medical attention (report to your doctor or health care professional if they continue or are bothersome): hair loss loss of appetite metallic taste in the mouth or changes in taste This list may not describe all possible side effects. Call your doctor for medical advice about side effects. You may report side effects to FDA at 1-800-FDA-1088. Where should I keep my medication? This drug is given in a hospital or clinic and will not be stored at home. NOTE: This sheet is a summary. It may not cover all possible information. If you have questions about this medicine, talk to your doctor, pharmacist, or health care provider.  2022 Elsevier/Gold Standard (2007-12-01 14:38:05)

## 2021-05-17 NOTE — Assessment & Plan Note (Addendum)
#  Stage IV-lung cancer-adenocarcinoma. AUG 2022- MRI brain-subcentimeter brain lesions [?  Symptomatic-see below]  # Proceed with carbo Alimta Keytruda cycle #2. Labs today reviewed;  acceptable for treatment today.  We will plan imaging after 3 cycles.  # Leucocytosis: WBC- 30; sec to steroids- monitor for now; stop pre-meds.   #Subcentimeter brain lesions-clinically asymptomatic question cognitive defects as per daughter.  Discussed the potential side effects of  WBRT radiation. Pt concerned he is concerned that side effects of radiation too toxic.  Wants to hold off radiation.  Would await repeating a scan after 3 cycles.  # Malignant pericardial tamponade-s/p pericardiocentesis;-secondary to lung adenocarcinoma-repeat AUG 2022-2D echo negative for any pericardial effusion.   STABLE.  #Acute respiratory failure-multifactorial-malignancy/lymphangitic spread/COPD stable-significant provement s/p/bronchodilators/diuretics- STABLE.   #A. Fib-amiodarone/Cardizem as per cardiology-  STABLE; . Not on anti-coagulation [?  Hemorrhagic pericardial effusion]  # PBG- 326 -on steroids; [prediabetes]-- cut the dose of steroids; stop the dex pre & post meds.   # "Black mold"- pt fixated regarding black mold at home; patient concerned about black mold infection causing serious illness with her ongoing chemotherapy.  Discussed that it is quite possible that she could get sick from monoinfection at home-however if we wait too long to wait on chemotherapy-she might die from the cancer itself.  Patient unfortunately disappointed-with lack of too many options.  Offered evaluation with pulmonary/ID declines.  Proceed with nystatin mouthwash.  No fluconazole.  # COVID VACCINATION: Un-vaccinated; DECLINES. Discus [facility].   # DISPOSITION: # treatment today # labs- in 10 days-MD;  Labs- cbc/bmp;possible IVFs.  # follow up in 3 weeks- MD; labs- cbc/cmp; carbo-alimta-keytruda-Dr.B  # 40 minutes face-to-face with  the patient discussing the above plan of care; more than 50% of time spent on prognosis/ natural history; counseling and coordination.

## 2021-05-17 NOTE — Progress Notes (Signed)
[place] CONSULT NOTE  Patient Care Team: Practice, [facility] (Inactive) as PCP - General End, Harrell Gave, MD as PCP - Cardiology (Cardiology) Telford Nab, RN as Oncology Nurse Navigator  CHIEF COMPLAINTS/PURPOSE OF CONSULTATION: Lung cancer  #  Oncology History Overview Note  # AUG 2022-stage IV adenocarcinoma of the lung [s/p pericardial effusion/tamponade]. MRI Brain AUG 16th, 2022- IMPRESSION: Multiple peripherally enhancing, centrally T1 hypointense lesions, with surrounding T2 hyperintense signal in the bilateral cerebral hemispheres, concerning for metastatic disease.    # AUG 17th 2022- CARBO-ALIMTA-Keytruda #1  #Acute respiratory failure BiPAP/ICU.  S/p pericardiocentesis    Malignant pericardial effusion (HCC)  03/28/2021 Initial Diagnosis   Malignant pericardial effusion ([place])   04/25/2021 -  Chemotherapy    Patient is on Treatment Plan: LUNG CARBOPLATIN / PEMETREXED / PEMBROLIZUMAB Q21D INDUCTION X 4 CYCLES / MAINTENANCE PEMETREXED + PEMBROLIZUMAB       Cancer of upper lobe of right lung ([place])  04/10/2021 Initial Diagnosis   Cancer of upper lobe of right lung ([place])   04/10/2021 Cancer Staging   Staging form: Lung, AJCC 8th Edition - Clinical: Stage IVA (cT2, cN2, cM1a) - Signed by Cammie Sickle, MD on 04/10/2021      HISTORY OF PRESENTING ILLNESS: Alone.  We will check. Donovan Kail 60 y.o.  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  Breathing is improved.  Denies any significant shortness of breath or cough.  No headaches.  No nausea no vomiting.  Review of Systems  Constitutional:  Positive for malaise/fatigue and weight loss. Negative for chills, diaphoresis and fever.  HENT:  Negative for nosebleeds and sore throat.   Eyes:  Negative for double vision.  Respiratory:  Positive for shortness of breath. Negative for cough, hemoptysis, sputum  production and wheezing.   Cardiovascular:  Negative for chest pain, palpitations, orthopnea and leg swelling.  Gastrointestinal:  Negative for abdominal pain, blood in stool, constipation, diarrhea, heartburn, melena, nausea and vomiting.  Genitourinary:  Negative for dysuria, frequency and urgency.  Musculoskeletal:  Negative for back pain and joint pain.  Skin: Negative.  Negative for itching and rash.  Neurological:  Negative for dizziness, tingling, focal weakness, weakness and headaches.  Endo/Heme/Allergies:  Does not bruise/bleed easily.  Psychiatric/Behavioral:  Negative for depression. The patient is not nervous/anxious and does not have insomnia.     MEDICAL HISTORY:  Past Medical History:  Diagnosis Date   Graves disease    Malignant pericardial effusion (HCC)    Paroxysmal atrial fibrillation ([place])    Primary lung adenocarcinoma ([place])     SURGICAL HISTORY: Past Surgical History:  Procedure Laterality Date   IR IMAGING GUIDED PORT INSERTION  04/27/2021   PERICARDIOCENTESIS N/A 03/27/2021   Procedure: PERICARDIOCENTESIS;  Surgeon: Nelva Bush, MD;  Location: [facility];  Service: Cardiovascular;  Laterality: N/A;   TUBAL LIGATION      SOCIAL HISTORY: Social History   Socioeconomic History   Marital status: Widowed    Spouse name: Not on file   Number of children: Not on file   Years of education: Not on file   Highest education level: Not on file  Occupational History   Not on file  Tobacco Use   Smoking status: Former    Packs/day: 1.00    Years: 40.00    Pack years: 40.00    Types: Cigarettes   Smokeless tobacco: Never  Vaping Use   Vaping Use: Never  used  Substance and Sexual Activity   Alcohol use: No   Drug use: Not Currently    Types: Marijuana   Sexual activity: Not on file  Other Topics Concern   Not on file  Social History Narrative   Not on file   Social Determinants of Health   Financial Resource Strain: High Risk    Difficulty of Paying Living Expenses: Very hard  Food Insecurity: Not on file  Transportation Needs: Not on file  Physical Activity: Not on file  Stress: Stress Concern Present   Feeling of Stress : Very much  Social Connections: Not on file  Intimate Partner Violence: Not on file    FAMILY HISTORY: Family History  Problem Relation Age of Onset   Peripheral Artery Disease Mother     ALLERGIES:  is allergic to diazepam, metoprolol, and tapazole [methimazole].  MEDICATIONS:  Current Outpatient Medications  Medication Sig Dispense Refill   amiodarone (PACERONE) 200 MG tablet Take 1 tablet (200 mg total) by mouth once daily. 90 tablet 3   dexamethasone (DECADRON) 4 MG tablet Take 1 tablet by mouth in the morning and evening 2 days. Take the day prior and the day after chemo. DO NOT take on the day of chemo. 60 tablet 0   diltiazem (DILACOR XR) 240 MG 24 hr capsule Take 1 capsule (240 mg total) by mouth once daily. 90 capsule 3   folic acid (FOLVITE) 1 MG tablet Take 1 tablet (1 mg total) by mouth once daily. 90 tablet 1   lidocaine-prilocaine (EMLA) cream Apply 1 application topically daily as needed. 30 g 0   Multiple Vitamin (MULTI-VITAMINS) TABS Take 1 tablet by mouth daily.     albuterol (VENTOLIN HFA) 108 (90 Base) MCG/ACT inhaler Inhale 2 puffs into the lungs every 6 (six) hours as needed for wheezing or shortness of breath. (Patient not taking: No sig reported) 8.5 g 0   fluconazole (DIFLUCAN) 100 MG tablet Take 2 tablets (200mg  total) by mouth on Day 1. Then take 1 tablet (100mg ) by mouth once daily thereafter. (Patient not taking: No sig reported) 15 tablet 0   furosemide (LASIX) 20 MG tablet Take 1 tablet (20 mg total) by mouth once daily as needed for edema (or shortness of breath). (Patient not taking: Reported on 05/17/2021) 15 tablet 0   lidocaine (XYLOCAINE) 2 % solution Use as directed. Swish and spit 15 mLs in the mouth or throat once every 6 (six) hours as needed for mouth  pain. (Patient not taking: No sig reported) 240 mL 0   nystatin (MYCOSTATIN) 100000 UNIT/ML suspension Swish and spit 5 mLs (500,000 Units total) by mouth once every 8 (eight) hours as needed. 150 mL 0   ondansetron (ZOFRAN) 4 MG tablet Take 2 tablets (8mg  total) by mouth once every 8 hours as needed for nausea and vomiting. (Patient not taking: No sig reported) 80 tablet 1   prochlorperazine (COMPAZINE) 10 MG tablet Take 1 tablet (10 mg total) by mouth once every 6 (six) hours as needed for nausea or vomiting. (Patient not taking: No sig reported) 40 tablet 1   No current facility-administered medications for this visit.      Marland Kitchen  PHYSICAL EXAMINATION: ECOG PERFORMANCE STATUS: 1 - Symptomatic but completely ambulatory  Vitals:   05/17/21 0901  BP: 124/75  Pulse: (!) 104  Resp: 18  Temp: (!) 96.8 F (36 C)  SpO2: 97%   Filed Weights   05/17/21 0901  Weight: 129 lb (58.5 kg)  Physical Exam Vitals and nursing note reviewed.  Constitutional:      Comments: Accompanied by daughter.  Patient in wheelchair.  HENT:     Head: Normocephalic and atraumatic.     Mouth/Throat:     Pharynx: Oropharynx is clear.  Eyes:     Extraocular Movements: Extraocular movements intact.     Pupils: Pupils are equal, round, and reactive to light.  Cardiovascular:     Rate and Rhythm: Normal rate and regular rhythm.  Pulmonary:     Comments: Decreased breath sounds bilaterally.  Abdominal:     Palpations: Abdomen is soft.  Musculoskeletal:        General: Normal range of motion.     Cervical back: Normal range of motion.  Skin:    General: Skin is warm.  Neurological:     General: No focal deficit present.     Mental Status: She is alert and oriented to person, place, and time.  Psychiatric:        Behavior: Behavior normal.        Judgment: Judgment normal.     LABORATORY DATA:  I have reviewed the data as listed Lab Results  Component Value Date   WBC 30.5 (H) 05/17/2021   HGB  12.9 05/17/2021   HCT 38.6 05/17/2021   MCV 86.7 05/17/2021   PLT 620 (H) 05/17/2021   Recent Labs    03/27/21 1635 03/28/21 0131 04/12/21 1019 04/25/21 0815 05/04/21 1307 05/17/21 0834  NA  --    < > 127* 136 133* 134*  K  --    < > 4.0 4.8 3.8 4.5  CL  --    < > 93* 100 97* 99  CO2  --    < > 24 23 27 24   GLUCOSE  --    < > 111* 299* 141* 326*  BUN  --    < > 11 11 11 13   CREATININE  --    < > 0.56 0.64 0.45 0.56  CALCIUM  --    < > 8.8* 9.4 9.1 9.4  GFRNONAA  --    < > >60 >60 >60 >60  PROT 7.7   < > 6.9 7.3 6.6  --   ALBUMIN 3.2*   < > 2.9* 2.6* 2.4*  --   AST 31   < > 30 35 36  --   ALT 21   < > 34 32 41  --   ALKPHOS 74   < > 81 93 97  --   BILITOT 1.2   < > 0.5 0.5 0.3  --   BILIDIR 0.4*  --   --   --   --   --   IBILI 0.8  --   --   --   --   --    < > = values in this interval not displayed.    RADIOGRAPHIC STUDIES: I have personally reviewed the radiological images as listed and agreed with the findings in the report. DG Chest 2 View  Result Date: 05/02/2021 CLINICAL DATA:  Shortness of breath, history of lung cancer EXAM: CHEST - 2 VIEW COMPARISON:  Chest x-ray dated April 04, 2021 FINDINGS: Interval placement of right chest wall port with tip position near the superior cavoatrial junction. Mediastinal contours are within normal limits. Unchanged right upper lobe mass. Findings compatible with underlying emphysema. Mild bilateral interstitial opacities. No focal consolidations. No pleural effusion or pneumothorax. IMPRESSION: Unchanged right upper lobe mass. Mild bilateral  interstitial opacities, possibly due to pulmonary edema. Electronically Signed   By: Yetta Glassman M.D.   On: 05/02/2021 15:23   MR Brain W Wo Contrast  Result Date: 04/25/2021 CLINICAL DATA:  Lung cancer staging EXAM: MRI HEAD WITHOUT AND WITH CONTRAST TECHNIQUE: Multiplanar, multiecho pulse sequences of the brain and surrounding structures were obtained without and with intravenous contrast.  CONTRAST:  37mL GADAVIST GADOBUTROL 1 MMOL/ML IV SOLN COMPARISON:  None. FINDINGS: Brain: Multiple foci of increased T2 signal, the majority of which correlate with peripherally enhancing, centrally T1 hypointense lesions, throughout the bilateral cerebral hemispheres, concerning for metastatic disease. 7 x 6 mm lesion in the posterior right temporal lobe (series 18, image 69). 7 x 6 mm lesion in the left parietal lobe (series 18, image 108). 6 x 4 mm lesion in the left frontal lobe (series 18, image 121). Additional focus of increased T2 signal in the right parietal lobe (series 15, image 43), is without correlate on the T1 image and may represent a developing lesion. No acute infarct, hemorrhage, mass, mass effect, or midline shift. Normal ventricles and sulci. Vascular: Normal flow voids. Skull and upper cervical spine: Normal marrow signal. Sinuses/Orbits: Mucosal thickening in the maxillary sinuses, ethmoid air cells, right sphenoid sinus, and left greater than right frontal sinus. The orbits are unremarkable. Other: None. IMPRESSION: Multiple peripherally enhancing, centrally T1 hypointense lesions, with surrounding T2 hyperintense signal in the bilateral cerebral hemispheres, concerning for metastatic disease. Electronically Signed   By: Merilyn Baba M.D.   On: 04/25/2021 15:16   NM PET Image Initial (PI) Skull Base To Thigh  Result Date: 04/27/2021 CLINICAL DATA:  Initial treatment strategy for non-small cell lung cancer. Intracranial metastatic lesions. EXAM: NUCLEAR MEDICINE PET SKULL BASE TO THIGH TECHNIQUE: 7.1 mCi F-18 FDG was injected intravenously. Full-ring PET imaging was performed from the skull base to thigh after the radiotracer. CT data was obtained and used for attenuation correction and anatomic localization. Fasting blood glucose: 120 mg/dl COMPARISON:  Multiple exams, including 03/27/2021 FINDINGS: Mediastinal blood pool activity: SUV max 1.9 Liver activity: SUV max 2.4 NECK: The  patient's known intracranial lesions are subcentimeter and not readily appreciated on today's PET-CT possibly due to the small size. Bilateral V lymph nodes are present in the neck, with the left-sided level V lymph node measuring 1.1 cm in short axis on image 61 series 3 with maximum SUV 6.7. Bilateral hypermetabolic left level IV lymph nodes include a left level IV lymph node on image 72 of series 3 measuring 0.9 cm with maximum SUV 7.0. A hypodense nodule within or along the right anterior thyroid lobe measures 0.9 by 0.8 cm on image 77 series 3 and has a maximum SUV of 5.3, substantially above the remainder of the thyroid gland. It is conceivable that this lesion is a lymph node immediately adjacent to the anterior thyroid although there is not a well-defined tissue plane. Incidental CT findings: Chronic ethmoid and bilateral maxillary sinusitis. Mild bilateral common carotid atherosclerotic calcification. CHEST: The dominant centrally necrotic right upper lobe mass measures 4.4 by 4.0 cm on image 94 series 3 with maximum SUV 10.2. 0.5 cm satellite nodule anterior to this mass on image 96 series 3 has maximum SUV of 2.7. Primarily in the right lower lobe but also inferiorly in the right upper lobe and in the right middle lobe, we demonstrate airway thickening with peribronchovascular nodularity and interstitial accentuation, worsened compared to 03/27/2021. This has some low-grade hypermetabolic activity for example measuring up to  3.5 in the right lower lobe, with possibilities including lymphangitic carcinomatosis versus infection. Peripheral subpleural bandlike opacity in the left upper lobe anteriorly is mildly hypermetabolic with a maximum SUV up to 2.7. Reticulonodular opacities anteriorly in the left lower lobe have maximum SUV up to 1.7. There is hypermetabolic left supraclavicular, left subpectoral, left axillary, right paratracheal, left paratracheal, subcarinal, and paraesophageal adenopathy. Index  left subpectoral lymph node 1.0 cm in short axis on image 88 series 3, maximum SUV 3.7. Index right lower paratracheal lymph node 0.9 cm in short axis on image 101 series 3, maximum SUV 3.5. Subcarinal lymph nodes maximum SUV 5.0. Incidental CT findings: Severe emphysema. Atherosclerotic calcification of the thoracic aorta and branch vessels. Mild cardiomegaly. Marked improvement in the prior pericardial effusion. ABDOMEN/PELVIS: No significant abnormal hypermetabolic activity in this region. Incidental CT findings: Aortoiliac atherosclerotic vascular disease. Sigmoid colon diverticulosis. SKELETON: No significant abnormal hypermetabolic activity in this region. Incidental CT findings: none IMPRESSION: 1. Centrally necrotic hypermetabolic right upper lobe mass with maximum SUV 10.2. Adjacent 0.5 cm satellite nodule anterior to the mass has a maximum SUV of 2.7. 2. Mildly hypermetabolic mediastinal, left subpectoral, left axillary, paratracheal, and lower neck level IV and V adenopathy, suspicious for metastatic spread. 3. A 9 by 8 mm hypermetabolic lesion within or along the right anterior thyroid lobe has maximum SUV of 5.3. This could be an immediately adjacent lymph node or possibly a hypermetabolic thyroid nodule. Recommend thyroid US and biopsy (ref: J Am Coll Radiol. 2015 Feb;12(2): 143-50). 4. Substantially progressive airway thickening and peribronchovascular nodularity with interstitial accentuation especially in the right lower lobe and to a lesser degree in portions of the right upper lobe and right middle lobe. Possibilities include infection and lymphangitic carcinomatosis. Associated low-grade hypermetabolic activity in the right lower lobe measures up to 3.5. 5. Subpleural bandlike opacity in the left upper lobe is not characteristic for malignancy and has a maximum SUV of up to 2.7, likely inflammatory. 6. The patient's known intracranial lesions are sub-centimeter in size and not readily appreciated  on today's PET-CT. 7. Other imaging findings of potential clinical significance: Aortic Atherosclerosis (ICD10-I70.0) and Emphysema (ICD10-J43.9). Mild cardiomegaly. Marked improvement in prior pericardial effusion. Sigmoid colon diverticulosis. Chronic ethmoid and bilateral maxillary sinusitis. Electronically Signed   By: Van Clines M.D.   On: 04/27/2021 13:51   IR IMAGING GUIDED PORT INSERTION  Result Date: 04/27/2021 INDICATION: Chemotherapy access, lung cancer EXAM: IMPLANTED PORT A CATH PLACEMENT WITH ULTRASOUND AND FLUOROSCOPIC GUIDANCE MEDICATIONS: None ANESTHESIA/SEDATION: Moderate (conscious) sedation was employed during this procedure. A total of Versed 2 mg and Fentanyl 100 mcg was administered intravenously. Moderate Sedation Time: 30 minutes. The patient's level of consciousness and vital signs were monitored continuously by radiology nursing throughout the procedure under my direct supervision. FLUOROSCOPY TIME:  0 minutes, 24 seconds (1.5 mGy) COMPLICATIONS: None immediate. PROCEDURE: The procedure, risks, benefits, and alternatives were explained to the patient. Questions regarding the procedure were encouraged and answered. The patient understands and consents to the procedure. The patient was placed supine on the exam table. The right neck and chest was prepped and draped in the standard sterile fashion. A preliminary ultrasound of the right neck was performed and demonstrates a patent right internal jugular vein. A permanent ultrasound image was stored. The overlying skin was anesthetized with 1% Lidocaine. Using ultrasound guidance, access was obtained into the right internal jugular vein using a 21 gauge micropuncture set. A wire was advanced into the SVC, a short incision was  made at the puncture site, and serial dilatation performed. Next, in an ipsilateral infraclavicular location, an incision was made at the site of the subcutaneous reservoir. Blunt dissection was used to open a  pocket to contain the reservoir. A subcutaneous tunnel was then created from the port site to the puncture site. A(n) 8 Fr single lumen catheter was advanced through the tunnel. The catheter was attached to the port and this was placed in the subcutaneous pocket. Under fluoroscopic guidance, a peel away sheath was placed, and the catheter was trimmed to the appropriate length and was advanced into the central veins. The catheter length is 21 cm. The tip of the catheter lies near the superior cavoatrial junction. The port flushes and aspirates appropriately. The port was flushed and locked with heparinized saline. The port pocket was closed in 2 layers using 3-0 and 4-0 Vicryl/absorbable suture. Dermabond was also applied to both incisions. The patient tolerated the procedure well and was transferred to recovery in stable condition. IMPRESSION: Successful placement of a right chest port using the right internal jugular vein. The port is ready for immediate use. Electronically Signed   By: Albin Felling M.D.   On: 04/27/2021 14:00    ASSESSMENT & PLAN:   Cancer of upper lobe of right lung ([place]) #Stage IV-lung cancer-adenocarcinoma. AUG 2022- MRI brain-subcentimeter brain lesions [?  Symptomatic-see below]  # Proceed with carbo Alimta Keytruda cycle #2. Labs today reviewed;  acceptable for treatment today.  We will plan imaging after 3 cycles.  # Leucocytosis: WBC- 30; sec to steroids- monitor for now; stop pre-meds.   #Subcentimeter brain lesions-clinically asymptomatic question cognitive defects as per daughter.  Discussed the potential side effects of  WBRT radiation. Pt concerned he is concerned that side effects of radiation too toxic.  Wants to hold off radiation.  Would await repeating a scan after 3 cycles.  # Malignant pericardial tamponade-s/p pericardiocentesis;-secondary to lung adenocarcinoma-repeat AUG 2022-2D echo negative for any pericardial effusion.   STABLE.  #Acute respiratory  failure-multifactorial-malignancy/lymphangitic spread/COPD stable-significant provement s/p/bronchodilators/diuretics- STABLE.   #A. Fib-amiodarone/Cardizem as per cardiology-  STABLE; . Not on anti-coagulation [?  Hemorrhagic pericardial effusion]  # PBG- 326 -on steroids; [prediabetes]-- cut the dose of steroids; stop the dex pre & post meds.   # "Black mold"- pt fixated regarding black mold at home; patient concerned about black mold infection causing serious illness with her ongoing chemotherapy.  Discussed that it is quite possible that she could get sick from monoinfection at home-however if we wait too long to wait on chemotherapy-she might die from the cancer itself.  Patient unfortunately disappointed-with lack of too many options.  Offered evaluation with pulmonary/ID declines.  Proceed with nystatin mouthwash.  No fluconazole.  # COVID VACCINATION: Un-vaccinated; DECLINES. Discus [facility]HE[place].   # DISPOSITION: # treatment today # labs- in 10 days-MD;  Labs- cbc/bmp;possible IVFs.  # follow up in 3 weeks- MD; labs- cbc/cmp; carbo-alimta-keytruda-Dr.B  # 40 minutes face-to-face with the patient discussing the above plan of care; more than 50% of time spent on prognosis/ natural history; counseling and coordination.    All questions were answered. The patient knows to call the clinic with any problems, questions or concerns.    Cammie Sickle, MD 05/17/2021 5:20 PM

## 2021-05-17 NOTE — Patient Instructions (Signed)
#  Do not take dexamethasone the day before; or the day after chemotherapy-this is making your blood sugars go up.

## 2021-05-18 ENCOUNTER — Telehealth: Payer: Self-pay | Admitting: *Deleted

## 2021-05-18 NOTE — Telephone Encounter (Signed)
Per pt's daughter, pt believes that she has a fungal infection due to mold problem in her house. [name] daughter wants to know if there is any way we could prove to patient that she does not have a fungal infection. [name] daughter states that pt is in denial and believes all of her symptoms are related to suspected fungal infection.

## 2021-05-21 ENCOUNTER — Ambulatory Visit: Payer: Self-pay | Admitting: Internal Medicine

## 2021-05-21 NOTE — Telephone Encounter (Signed)
Mychart msg sent to patient to see if she is interested in referral to ID. [place]- will you follow-up

## 2021-05-23 ENCOUNTER — Other Ambulatory Visit: Payer: Self-pay

## 2021-05-23 ENCOUNTER — Ambulatory Visit (INDEPENDENT_AMBULATORY_CARE_PROVIDER_SITE_OTHER): Payer: Self-pay | Admitting: Internal Medicine

## 2021-05-23 ENCOUNTER — Encounter: Payer: Self-pay | Admitting: Internal Medicine

## 2021-05-23 VITALS — BP 110/80 | HR 101 | Ht 61.0 in | Wt 127.0 lb

## 2021-05-23 DIAGNOSIS — I313 Pericardial effusion (noninflammatory): Secondary | ICD-10-CM

## 2021-05-23 DIAGNOSIS — I480 Paroxysmal atrial fibrillation: Secondary | ICD-10-CM

## 2021-05-23 DIAGNOSIS — C801 Malignant (primary) neoplasm, unspecified: Secondary | ICD-10-CM

## 2021-05-23 DIAGNOSIS — C3411 Malignant neoplasm of upper lobe, right bronchus or lung: Secondary | ICD-10-CM

## 2021-05-23 DIAGNOSIS — I3131 Malignant pericardial effusion in diseases classified elsewhere: Secondary | ICD-10-CM

## 2021-05-23 NOTE — Progress Notes (Signed)
Follow-up Outpatient Visit Date: 05/23/2021  Primary Care Provider: [facility], [facility] (Inactive) [address] [address]  Chief Complaint: Follow-up malignant pericardial effusion and paroxysmal atrial fibrillation  HPI:  [name] is a 60 y.o. female with history of recently diagnosed lung adenocarcinoma complicated by malignant pericardial effusion requiring emergent pericardiocentesis in the setting of tamponade and paroxysmal atrial fibrillation, who presents for follow-up of pericardial effusion and paroxysmal atrial fibrillation.  I last saw her a month ago, shortly after her hospitalization for cardiac tamponade.  At our follow-up visit, she continued to complain about shortness of breath with mild activity as well as intermittent tachycardia.  Bedside echo at that time showed no significant pericardial effusion.  We agreed to decrease amiodarone to 200 mg daily as well as switch furosemide to as needed dosing.  She is currently undergoing chemotherapy with Dr. 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  Today, [name] reports that she is feeling fairly well.  She has had quite a bit of fatigue and somnolence with chemotherapy.  Her breathing has improved.  She denies significant shortness of breath as well as chest pain, palpitations, lightheadedness, and edema.  She has not needed to use any as needed furosemide.  She has completed 2 cycles of chemotherapy and is scheduled for her next cycle at the [facility] of this month.  --------------------------------------------------------------------------------------------------  Past Medical History:  Diagnosis Date   Graves disease    Malignant pericardial effusion (HCC)    Paroxysmal atrial fibrillation ([place])    Primary lung adenocarcinoma ([place])    Past Surgical History:  Procedure Laterality Date   IR IMAGING GUIDED PORT INSERTION  04/27/2021   PERICARDIOCENTESIS  N/A 03/27/2021   Procedure: PERICARDIOCENTESIS;  Surgeon: Nelva Bush, MD;  Location: [facility];  Service: Cardiovascular;  Laterality: N/A;   TUBAL LIGATION       Current Meds  Medication Sig   albuterol (VENTOLIN HFA) 108 (90 Base) MCG/ACT inhaler Inhale 2 puffs into the lungs every 6 (six) hours as needed for wheezing or shortness of breath.   amiodarone (PACERONE) 200 MG tablet Take 1 tablet (200 mg total) by mouth once daily.   diltiazem (DILACOR XR) 240 MG 24 hr capsule Take 1 capsule (240 mg total) by mouth once daily.   folic acid (FOLVITE) 1 MG tablet Take 1 tablet (1 mg total) by mouth once daily.   furosemide (LASIX) 20 MG tablet Take 1 tablet (20 mg total) by mouth once daily as needed for edema (or shortness of breath).   lidocaine (XYLOCAINE) 2 % solution Use as directed. Swish and spit 15 mLs in the mouth or throat once every 6 (six) hours as needed for mouth pain.   lidocaine-prilocaine (EMLA) cream Apply 1 application topically daily as needed.   Multiple Vitamin (MULTI-VITAMINS) TABS Take 1 tablet by mouth daily.   nystatin (MYCOSTATIN) 100000 UNIT/ML suspension Swish and spit 5 mLs (500,000 Units total) by mouth once every 8 (eight) hours as needed.   ondansetron (ZOFRAN) 4 MG tablet Take 2 tablets (8mg  total) by mouth once every 8 hours as needed for nausea and vomiting.   prochlorperazine (COMPAZINE) 10 MG tablet Take 1 tablet (10 mg total) by mouth once every 6 (six) hours as needed for nausea or vomiting.    Allergies: Diazepam, Metoprolol, and Tapazole [methimazole]  Social History   Tobacco Use   Smoking status: Former    Packs/day: 1.00  Years: 40.00    Pack years: 40.00    Types: Cigarettes   Smokeless tobacco: Never  Vaping Use   Vaping Use: Never used  Substance Use Topics   Alcohol use: No   Drug use: Not Currently    Types: Marijuana    Family History  Problem Relation Age of Onset   Peripheral Artery Disease Mother      Review of Systems: A 12-system review of systems was performed and was negative except as noted in the HPI.  --------------------------------------------------------------------------------------------------  Physical Exam: BP 110/80 (BP Location: Left Arm, Patient Position: Sitting, Cuff Size: Normal)   Pulse (!) 101   Ht 5\' 1"  (1.549 m)   Wt 127 lb (57.6 kg)   SpO2 96%   BMI 24.00 kg/m   General:  NAD. Neck: No JVD or HJR. Lungs: Clear to auscultation bilaterally without wheezes or crackles. Heart: Tachycardic but regular without murmurs, rubs, or gallops. Abdomen: Soft, nontender, nondistended. Extremities: No lower extremity edema.  EKG: Sinus tachycardia with right axis deviation and right bundle branch block.  Heart rate has decreased from prior tracing on 04/18/2021.  Otherwise, there has been no significant interval change.  Lab Results  Component Value Date   WBC 30.5 (H) 05/17/2021   HGB 12.9 05/17/2021   HCT 38.6 05/17/2021   MCV 86.7 05/17/2021   PLT 620 (H) 05/17/2021    Lab Results  Component Value Date   NA 134 (L) 05/17/2021   K 4.5 05/17/2021   CL 99 05/17/2021   CO2 24 05/17/2021   BUN 13 05/17/2021   CREATININE 0.56 05/17/2021   GLUCOSE 326 (H) 05/17/2021   ALT 41 05/04/2021     --------------------------------------------------------------------------------------------------  ASSESSMENT AND PLAN: Lung adenocarcinoma and malignant pericardial effusion: No symptoms to suggest recurrent pericardial effusion.  Limited bedside echo at our last visit showed no significant effusion.  We will plan for limited echo in the next few weeks.  Continue chemotherapy per Dr. Rogue Bussing.  Paroxysmal atrial fibrillation: Nichole Park is maintaining sinus rhythm though heart rates remain mildly elevated.  In the setting of borderline low blood pressure and active malignancy, we will tolerate mild sinus tachycardia and continue with current doses of diltiazem as  well as amiodarone.  We will defer initiation of anticoagulation in the setting of a CHA2DS2-VASc score of 1 and high risk for hemorrhagic complications in the setting of malignant pericardial effusion and suspected brain metastases.  Follow-up: Return to clinic in 6 weeks.  Nelva Bush, MD 05/23/2021 1:19 PM

## 2021-05-23 NOTE — Patient Instructions (Signed)
Medication Instructions:   Your physician recommends that you continue on your current medications as directed. Please refer to the Current Medication list given to you today.  *If you need a refill on your cardiac medications before your next appointment, please call your pharmacy*   Lab Work:  None ordered  Testing/Procedures:  Your physician has requested that you have an echocardiogram. Echocardiography is a painless test that uses sound waves to create images of your heart. It provides your doctor with information about the size and shape of your heart and how well your heart's chambers and valves are working. This procedure takes approximately one hour. There are no restrictions for this procedure.    Follow-Up: At [facility], you and your health needs are our priority.  As part of our continuing mission to provide you with exceptional heart care, we have created designated Provider Care Teams.  These Care Teams include your primary Cardiologist (physician) and Advanced Practice Providers (APPs -  Physician Assistants and Nurse Practitioners) who all work together to provide you with the care you need, when you need it.  We recommend signing up for the patient portal called "MyChart".  Sign up information is provided on this After Visit Summary.  MyChart is used to connect with patients for Virtual Visits (Telemedicine).  Patients are able to view lab/test results, encounter notes, upcoming appointments, etc.  Non-urgent messages can be sent to your provider as well.   To learn more about what you can do with MyChart, go to [email or web address].    Your next appointment:   6 week(s)  The format for your next appointment:   In Person  Provider:   You may see Nelva Bush, MD or one of the following Advanced Practice Providers on your designated Care Team:   Murray Hodgkins, NP Christell Faith, PA-C Marrianne Mood, PA-C Cadence [place], [place]

## 2021-05-23 NOTE — Telephone Encounter (Signed)
Spoke with pt's daughter and she stated that she will discuss with her mom if she would like to be referred to ID. [name] daughter stated that she will reply back to the MyChart message.

## 2021-05-28 ENCOUNTER — Inpatient Hospital Stay: Payer: Medicaid Other

## 2021-05-28 DIAGNOSIS — Z5112 Encounter for antineoplastic immunotherapy: Secondary | ICD-10-CM | POA: Diagnosis not present

## 2021-05-28 DIAGNOSIS — E860 Dehydration: Secondary | ICD-10-CM

## 2021-05-28 DIAGNOSIS — C3411 Malignant neoplasm of upper lobe, right bronchus or lung: Secondary | ICD-10-CM

## 2021-05-28 LAB — CBC WITH DIFFERENTIAL/PLATELET
Abs Immature Granulocytes: 0.02 10*3/uL (ref 0.00–0.07)
Basophils Absolute: 0 10*3/uL (ref 0.0–0.1)
Basophils Relative: 1 %
Eosinophils Absolute: 0.1 10*3/uL (ref 0.0–0.5)
Eosinophils Relative: 1 %
HCT: 36.9 % (ref 36.0–46.0)
Hemoglobin: 12.2 g/dL (ref 12.0–15.0)
Immature Granulocytes: 1 %
Lymphocytes Relative: 42 %
Lymphs Abs: 1.8 10*3/uL (ref 0.7–4.0)
MCH: 28.6 pg (ref 26.0–34.0)
MCHC: 33.1 g/dL (ref 30.0–36.0)
MCV: 86.4 fL (ref 80.0–100.0)
Monocytes Absolute: 0.6 10*3/uL (ref 0.1–1.0)
Monocytes Relative: 15 %
Neutro Abs: 1.7 10*3/uL (ref 1.7–7.7)
Neutrophils Relative %: 40 %
Platelets: 142 10*3/uL — ABNORMAL LOW (ref 150–400)
RBC: 4.27 MIL/uL (ref 3.87–5.11)
RDW: 15.3 % (ref 11.5–15.5)
Smear Review: NORMAL
WBC: 4.2 10*3/uL (ref 4.0–10.5)
nRBC: 0 % (ref 0.0–0.2)

## 2021-05-28 LAB — COMPREHENSIVE METABOLIC PANEL
ALT: 39 U/L (ref 0–44)
AST: 42 U/L — ABNORMAL HIGH (ref 15–41)
Albumin: 3.2 g/dL — ABNORMAL LOW (ref 3.5–5.0)
Alkaline Phosphatase: 96 U/L (ref 38–126)
Anion gap: 9 (ref 5–15)
BUN: 8 mg/dL (ref 6–20)
CO2: 27 mmol/L (ref 22–32)
Calcium: 9.4 mg/dL (ref 8.9–10.3)
Chloride: 101 mmol/L (ref 98–111)
Creatinine, Ser: 0.53 mg/dL (ref 0.44–1.00)
GFR, Estimated: >60 mL/min (ref >60)
Glucose, Bld: 146 mg/dL — ABNORMAL HIGH (ref 70–99)
Potassium: 3.8 mmol/L (ref 3.5–5.1)
Sodium: 137 mmol/L (ref 135–145)
Total Bilirubin: 0.7 mg/dL (ref 0.3–1.2)
Total Protein: 7.3 g/dL (ref 6.5–8.1)

## 2021-05-28 MED ORDER — SODIUM CHLORIDE 0.9 % IV SOLN
Freq: Once | INTRAVENOUS | Status: AC
  Filled 2021-05-28: qty 250

## 2021-05-28 MED ORDER — HEPARIN SOD (PORK) LOCK FLUSH 100 UNIT/ML IV SOLN
500.0000 [IU] | Freq: Once | INTRAVENOUS | Status: AC
  Administered 2021-05-28: 500 [IU] via INTRAVENOUS
  Filled 2021-05-28: qty 5

## 2021-05-28 NOTE — Progress Notes (Signed)
Lab results are okay this am. Pt agreed to receive 500 ml over 30 mins. She states she drinks and eats plenty. Discharged to home.

## 2021-06-04 ENCOUNTER — Other Ambulatory Visit: Payer: Self-pay

## 2021-06-07 ENCOUNTER — Inpatient Hospital Stay: Payer: Medicaid Other

## 2021-06-07 ENCOUNTER — Inpatient Hospital Stay (HOSPITAL_BASED_OUTPATIENT_CLINIC_OR_DEPARTMENT_OTHER): Payer: Medicaid Other | Admitting: Internal Medicine

## 2021-06-07 ENCOUNTER — Encounter: Payer: Self-pay | Admitting: Internal Medicine

## 2021-06-07 VITALS — BP 125/90 | HR 110 | Temp 97.4°F | Resp 16 | Wt 132.7 lb

## 2021-06-07 VITALS — HR 89

## 2021-06-07 DIAGNOSIS — Z5112 Encounter for antineoplastic immunotherapy: Secondary | ICD-10-CM | POA: Diagnosis not present

## 2021-06-07 DIAGNOSIS — C3411 Malignant neoplasm of upper lobe, right bronchus or lung: Secondary | ICD-10-CM

## 2021-06-07 DIAGNOSIS — I3131 Malignant pericardial effusion in diseases classified elsewhere: Secondary | ICD-10-CM

## 2021-06-07 DIAGNOSIS — Z95828 Presence of other vascular implants and grafts: Secondary | ICD-10-CM

## 2021-06-07 DIAGNOSIS — C7931 Secondary malignant neoplasm of brain: Secondary | ICD-10-CM

## 2021-06-07 LAB — CBC WITH DIFFERENTIAL/PLATELET
Abs Immature Granulocytes: 0.3 10*3/uL — ABNORMAL HIGH (ref 0.00–0.07)
Basophils Absolute: 0.1 10*3/uL (ref 0.0–0.1)
Basophils Relative: 2 %
Eosinophils Absolute: 0.2 10*3/uL (ref 0.0–0.5)
Eosinophils Relative: 2 %
HCT: 39.4 % (ref 36.0–46.0)
Hemoglobin: 12.8 g/dL (ref 12.0–15.0)
Immature Granulocytes: 4 %
Lymphocytes Relative: 29 %
Lymphs Abs: 2.5 10*3/uL (ref 0.7–4.0)
MCH: 28.8 pg (ref 26.0–34.0)
MCHC: 32.5 g/dL (ref 30.0–36.0)
MCV: 88.7 fL (ref 80.0–100.0)
Monocytes Absolute: 0.9 10*3/uL (ref 0.1–1.0)
Monocytes Relative: 11 %
Neutro Abs: 4.6 10*3/uL (ref 1.7–7.7)
Neutrophils Relative %: 52 %
Platelets: 402 10*3/uL — ABNORMAL HIGH (ref 150–400)
RBC: 4.44 MIL/uL (ref 3.87–5.11)
RDW: 18 % — ABNORMAL HIGH (ref 11.5–15.5)
Smear Review: NORMAL
WBC: 8.6 10*3/uL (ref 4.0–10.5)
nRBC: 0 % (ref 0.0–0.2)

## 2021-06-07 LAB — COMPREHENSIVE METABOLIC PANEL
ALT: 41 U/L (ref 0–44)
AST: 55 U/L — ABNORMAL HIGH (ref 15–41)
Albumin: 3.5 g/dL (ref 3.5–5.0)
Alkaline Phosphatase: 89 U/L (ref 38–126)
Anion gap: 8 (ref 5–15)
BUN: 13 mg/dL (ref 6–20)
CO2: 25 mmol/L (ref 22–32)
Calcium: 9.5 mg/dL (ref 8.9–10.3)
Chloride: 106 mmol/L (ref 98–111)
Creatinine, Ser: 0.63 mg/dL (ref 0.44–1.00)
GFR, Estimated: >60 mL/min (ref >60)
Glucose, Bld: 194 mg/dL — ABNORMAL HIGH (ref 70–99)
Potassium: 3.8 mmol/L (ref 3.5–5.1)
Sodium: 139 mmol/L (ref 135–145)
Total Bilirubin: 0.4 mg/dL (ref 0.3–1.2)
Total Protein: 7.5 g/dL (ref 6.5–8.1)

## 2021-06-07 MED ORDER — CYANOCOBALAMIN 1000 MCG/ML IJ SOLN
1000.0000 ug | Freq: Once | INTRAMUSCULAR | Status: AC
  Administered 2021-06-07: 1000 ug via INTRAMUSCULAR
  Filled 2021-06-07: qty 1

## 2021-06-07 MED ORDER — SODIUM CHLORIDE 0.9 % IV SOLN
200.0000 mg | Freq: Once | INTRAVENOUS | Status: AC
  Administered 2021-06-07: 200 mg via INTRAVENOUS
  Filled 2021-06-07: qty 8

## 2021-06-07 MED ORDER — SODIUM CHLORIDE 0.9 % IV SOLN
150.0000 mg | Freq: Once | INTRAVENOUS | Status: AC
  Administered 2021-06-07: 150 mg via INTRAVENOUS
  Filled 2021-06-07: qty 150

## 2021-06-07 MED ORDER — SODIUM CHLORIDE 0.9% FLUSH
10.0000 mL | Freq: Once | INTRAVENOUS | Status: DC
  Filled 2021-06-07: qty 10

## 2021-06-07 MED ORDER — SODIUM CHLORIDE 0.9 % IV SOLN
Freq: Once | INTRAVENOUS | Status: AC
  Filled 2021-06-07: qty 250

## 2021-06-07 MED ORDER — SODIUM CHLORIDE 0.9 % IV SOLN: 4.0000 mg | Freq: Once | INTRAVENOUS | Status: DC

## 2021-06-07 MED ORDER — SODIUM CHLORIDE 0.9 % IV SOLN
500.0000 mg/m2 | Freq: Once | INTRAVENOUS | Status: AC
  Administered 2021-06-07: 800 mg via INTRAVENOUS
  Filled 2021-06-07: qty 20

## 2021-06-07 MED ORDER — DEXAMETHASONE SODIUM PHOSPHATE 10 MG/ML IJ SOLN
4.0000 mg | Freq: Once | INTRAMUSCULAR | Status: AC
  Administered 2021-06-07: 4 mg via INTRAVENOUS
  Filled 2021-06-07: qty 1

## 2021-06-07 MED ORDER — HEPARIN SOD (PORK) LOCK FLUSH 100 UNIT/ML IV SOLN
500.0000 [IU] | Freq: Once | INTRAVENOUS | Status: AC | PRN
  Filled 2021-06-07: qty 5

## 2021-06-07 MED ORDER — PALONOSETRON HCL INJECTION 0.25 MG/5ML
0.2500 mg | Freq: Once | INTRAVENOUS | Status: AC
  Administered 2021-06-07: 0.25 mg via INTRAVENOUS
  Filled 2021-06-07: qty 5

## 2021-06-07 MED ORDER — HEPARIN SOD (PORK) LOCK FLUSH 100 UNIT/ML IV SOLN
INTRAVENOUS | Status: AC
  Administered 2021-06-07: 500 [IU]
  Filled 2021-06-07: qty 5

## 2021-06-07 MED ORDER — SODIUM CHLORIDE 0.9 % IV SOLN
465.0000 mg | Freq: Once | INTRAVENOUS | Status: AC
  Administered 2021-06-07: 470 mg via INTRAVENOUS
  Filled 2021-06-07: qty 47

## 2021-06-07 NOTE — Progress Notes (Signed)
Nutrition Follow-up:  Patient with stage IV lung adenocarcinoma with brain lesions.  Patient receiving carboplatin, alimta, keytruda.  Met with patient during infusion. Reports that she has a good appetite may drop off some in the evening.  Eating eggs with cheese, macaroni and cheese, vegetables, chicken, salmon, peanut butter, nuts.  Drinks an orgain shake daily.    Medications: reviewed  Labs: glucose 194  Anthropometrics:   Weight 132 lb 11.2   129 lb on 9/8 124 lb on 8/17   NUTRITION DIAGNOSIS: Increased nutrient needs continue   INTERVENTION:  Coupons given for orgain shakes Encouraged patient to continue well balanced diet including good sources of lean protein and plant foods.    MONITORING, EVALUATION, GOAL: weight trends, intake   NEXT VISIT: Thursday, October 20th during infusion  Onica Davidovich B. Zenia Resides, [place], [place] Registered Dietitian [phone] (mobile)

## 2021-06-07 NOTE — Patient Instructions (Signed)
[facility]  Discharge Instructions: Thank you for choosing [facility] to provide your oncology and hematology care.  If you have a lab appointment with the [facility], please go directly to the [facility] and check in at the registration area.  Wear comfortable clothing and clothing appropriate for easy access to any Portacath or PICC line.   We strive to give you quality time with your provider. You may need to reschedule your appointment if you arrive late (15 or more minutes).  Arriving late affects you and other patients whose appointments are after yours.  Also, if you miss three or more appointments without notifying the office, you may be dismissed from the clinic at the provider's discretion.      For prescription refill requests, have your pharmacy contact our office and allow 72 hours for refills to be completed.    Today you received the following chemotherapy and/or immunotherapy agents Keytruda, Alimta and Carboplatin       To help prevent nausea and vomiting after your treatment, we encourage you to take your nausea medication as directed.  BELOW ARE SYMPTOMS THAT SHOULD BE REPORTED IMMEDIATELY: *FEVER GREATER THAN 100.4 F (38 C) OR HIGHER *CHILLS OR SWEATING *NAUSEA AND VOMITING THAT IS NOT CONTROLLED WITH YOUR NAUSEA MEDICATION *UNUSUAL SHORTNESS OF BREATH *UNUSUAL BRUISING OR BLEEDING *URINARY PROBLEMS (pain or burning when urinating, or frequent urination) *BOWEL PROBLEMS (unusual diarrhea, constipation, pain near the anus) TENDERNESS IN MOUTH AND THROAT WITH OR WITHOUT PRESENCE OF ULCERS (sore throat, sores in mouth, or a toothache) UNUSUAL RASH, SWELLING OR PAIN  UNUSUAL VAGINAL DISCHARGE OR ITCHING   Items with * indicate a potential emergency and should be followed up as soon as possible or go to the Emergency Department if any problems should occur.  Please show the CHEMOTHERAPY ALERT CARD or IMMUNOTHERAPY  ALERT CARD at check-in to the Emergency Department and triage nurse.  Should you have questions after your visit or need to cancel or reschedule your appointment, please contact [facility]  [phone] and follow the prompts.  Office hours are 8:00 a.m. to 4:30 p.m. Monday - Friday. Please note that voicemails left after 4:00 p.m. may not be returned until the following business day.  We are closed weekends and major holidays. You have access to a nurse at all times for urgent questions. Please call the main number to the clinic [phone] and follow the prompts.  For any non-urgent questions, you may also contact your provider using MyChart. We now offer e-Visits for anyone 42 and older to request care online for non-urgent symptoms. For details visit [email or web address].   Also download the MyChart app! Go to the app store, search "MyChart", open the app, select [facility], and log in with your MyChart username and password.  Due to Covid, a mask is required upon entering the hospital/clinic. If you do not have a mask, one will be given to you upon arrival. For doctor visits, patients may have 1 support person aged 37 or older with them. For treatment visits, patients cannot have anyone with them due to current Covid guidelines and our immunocompromised population.

## 2021-06-07 NOTE — Progress Notes (Signed)
[facility] CONSULT NOTE  Patient Care Team: [facility] (Inactive) as PCP - General End, Harrell Gave, MD as PCP - Cardiology (Cardiology) Telford Nab, RN as Oncology Nurse Navigator  CHIEF COMPLAINTS/PURPOSE OF CONSULTATION: Lung cancer  #  Oncology History Overview Note  # AUG 2022-stage IV adenocarcinoma of the lung [s/p pericardial effusion/tamponade]. MRI Brain AUG 16th, 2022- IMPRESSION: Multiple peripherally enhancing, centrally T1 hypointense lesions, with surrounding T2 hyperintense signal in the bilateral cerebral hemispheres, concerning for metastatic disease.    # AUG 17th 2022- CARBO-ALIMTA-Keytruda #1  #Acute respiratory failure BiPAP/ICU.  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 Nichole Park 60 y.o.  female  stage IV adenocarcinoma the lung cancer is here currently on chemotherapy-immunotherapy is here for a follow up.    No new shortness of breath.  No worsening cough.  No worsening swelling the legs.  Appetite is improving.  Review of Systems  Constitutional:  Positive for malaise/fatigue. Negative for chills, diaphoresis and fever.  HENT:  Negative for nosebleeds and sore throat.   Eyes:  Negative for double vision.  Respiratory:  Positive for shortness of breath. Negative for cough, hemoptysis, sputum production and  wheezing.   Cardiovascular:  Negative for chest pain, palpitations, orthopnea and leg swelling.  Gastrointestinal:  Negative for abdominal pain, blood in stool, constipation, diarrhea, heartburn, melena, nausea and vomiting.  Genitourinary:  Negative for dysuria, frequency and urgency.  Musculoskeletal:  Negative for back pain and joint pain.  Skin: Negative.  Negative for itching and rash.  Neurological:  Negative for dizziness, tingling, focal weakness, weakness and headaches.  Endo/Heme/Allergies:  Does not bruise/bleed easily.  Psychiatric/Behavioral:  Negative for depression. The patient is not nervous/anxious and does not have insomnia.     MEDICAL HISTORY:  Past Medical History:  Diagnosis Date   Graves disease    Malignant pericardial effusion (HCC)    Paroxysmal atrial fibrillation ([place])    Primary lung adenocarcinoma ([place])     SURGICAL HISTORY: Past Surgical History:  Procedure Laterality Date   IR IMAGING GUIDED PORT INSERTION  04/27/2021   PERICARDIOCENTESIS N/A 03/27/2021   Procedure: PERICARDIOCENTESIS;  Surgeon: Nelva Bush, MD;  Location: [facility];  Service: Cardiovascular;  Laterality: N/A;   TUBAL LIGATION      SOCIAL HISTORY: Social History   Socioeconomic History   Marital status: Widowed    Spouse name: Not on file   Number of children: Not on file   Years of education: Not on file   Highest education level: Not on file  Occupational History   Not on file  Tobacco Use   Smoking status: Former    Packs/day: 1.00    Years: 40.00    Pack years: 40.00    Types: Cigarettes   Smokeless tobacco: Never  Vaping Use   Vaping Use: Never used  Substance and Sexual Activity   Alcohol use: No   Drug use: Not Currently    Types: Marijuana   Sexual activity: Not on file  Other Topics Concern   Not on file  Social History Narrative   Not on file   Social Determinants of Health   Financial Resource Strain: High Risk   Difficulty of Paying  Living Expenses: Very hard  Food Insecurity: Not on file  Transportation Needs: Not on file  Physical Activity: Not on file  Stress: Stress Concern Present   Feeling of Stress : Very much  Social Connections: Not on file  Intimate Partner Violence: Not on file    FAMILY HISTORY: Family History  Problem Relation Age of Onset   Peripheral Artery Disease Mother     ALLERGIES:  is allergic to diazepam, metoprolol, and tapazole [methimazole].  MEDICATIONS:  Current Outpatient Medications  Medication Sig Dispense Refill   albuterol (VENTOLIN HFA) 108 (90 Base) MCG/ACT inhaler Inhale 2 puffs into the lungs every 6 (six) hours as needed for wheezing or shortness of breath. 8.5 g 0   amiodarone (PACERONE) 200 MG tablet Take 1 tablet (200 mg total) by mouth once daily. 90 tablet 3   diltiazem (DILACOR XR) 240 MG 24 hr capsule Take 1 capsule (240 mg total) by mouth once daily. 90 capsule 3   folic acid (FOLVITE) 1 MG tablet Take 1 tablet (1 mg total) by mouth once daily. 90 tablet 1   furosemide (LASIX) 20 MG tablet Take 1 tablet (20 mg total) by mouth once daily as needed for edema (or shortness of breath). 15 tablet 0   lidocaine (XYLOCAINE) 2 % solution Use as directed. Swish and spit 15 mLs in the mouth or throat once every 6 (six) hours as needed for mouth pain. 240 mL 0   lidocaine-prilocaine (EMLA) cream Apply 1 application topically daily as needed. 30 g 0   Multiple Vitamin (MULTI-VITAMINS) TABS Take 1 tablet by mouth daily.     nystatin (MYCOSTATIN) 100000 UNIT/ML suspension Swish and spit 5 mLs (500,000 Units total) by mouth once every 8 (eight) hours as needed. 150 mL 0   ondansetron (ZOFRAN) 4 MG tablet Take 2 tablets (8mg  total) by mouth once every 8 hours as needed for nausea and vomiting. 80 tablet 1   prochlorperazine (COMPAZINE) 10 MG tablet Take 1 tablet (10 mg total) by mouth once every 6 (six) hours as needed for nausea or vomiting. 40 tablet 1   No current  facility-administered medications for this visit.      Marland Kitchen  PHYSICAL EXAMINATION: ECOG PERFORMANCE STATUS: 1 - Symptomatic but completely ambulatory  Vitals:   06/07/21 0843  BP: 125/90  Pulse: (!) 110  Resp: 16  Temp: (!) 97.4 F (36.3 C)   Filed Weights   06/07/21 0843  Weight: 132 lb 11.2 oz (60.2 kg)    Physical Exam Vitals and nursing note reviewed.  HENT:     Head: Normocephalic and atraumatic.     Mouth/Throat:     Pharynx: Oropharynx is clear.  Eyes:     Extraocular Movements: Extraocular movements intact.     Pupils: Pupils are equal, round, and reactive to light.  Cardiovascular:     Rate and Rhythm: Normal rate and regular rhythm.  Pulmonary:     Comments: Decreased breath sounds bilaterally.  Abdominal:     Palpations: Abdomen is soft.  Musculoskeletal:        General: Normal range of motion.  Cervical back: Normal range of motion.  Skin:    General: Skin is warm.  Neurological:     General: No focal deficit present.     Mental Status: She is alert and oriented to person, place, and time.  Psychiatric:        Behavior: Behavior normal.        Judgment: Judgment normal.     LABORATORY DATA:  I have reviewed the data as listed Lab Results  Component Value Date   WBC 8.6 06/07/2021   HGB 12.8 06/07/2021   HCT 39.4 06/07/2021   MCV 88.7 06/07/2021   PLT 402 (H) 06/07/2021   Recent Labs    03/27/21 1635 03/28/21 0131 05/04/21 1307 05/17/21 0834 05/28/21 0827 06/07/21 0820  NA  --    < > 133* 134* 137 139  K  --    < > 3.8 4.5 3.8 3.8  CL  --    < > 97* 99 101 106  CO2  --    < > 27 24 27 25   GLUCOSE  --    < > 141* 326* 146* 194*  BUN  --    < > 11 13 8 13   CREATININE  --    < > 0.45 0.56 0.53 0.63  CALCIUM  --    < > 9.1 9.4 9.4 9.5  GFRNONAA  --    < > >60 >60 >60 >60  PROT 7.7   < > 6.6  --  7.3 7.5  ALBUMIN 3.2*   < > 2.4*  --  3.2* 3.5  AST 31   < > 36  --  42* 55*  ALT 21   < > 41  --  39 41  ALKPHOS 74   < > 97  --  96  89  BILITOT 1.2   < > 0.3  --  0.7 0.4  BILIDIR 0.4*  --   --   --   --   --   IBILI 0.8  --   --   --   --   --    < > = values in this interval not displayed.    RADIOGRAPHIC STUDIES: I have personally reviewed the radiological images as listed and agreed with the findings in the report. No results found.  ASSESSMENT & PLAN:   Cancer of upper lobe of right lung ([place]) #Stage IV-lung cancer-adenocarcinoma. AUG 2022- MRI brain-subcentimeter brain lesions [asymptomatic-see below]  # Proceed with carbo Alimta Keytruda cycle #3. Labs today reviewed;  acceptable for treatment today.  We will plan imaging after 3 cycles/ scans ordered.   #Subcentimeter brain lesions-clinically asymptomatic question cognitive defects as per daughter. Declined WBRT for now; will repeat MRI brain.   # Malignant pericardial tamponade-s/p pericardiocentesis;-secondary to lung adenocarcinoma-repeat AUG 2022-2D echo negative for any pericardial effusion- STABLE.    # chronic respiratory failure-multifactorial-malignancy/lymphangitic spread/COPD stable-significant provement s/p/bronchodilators/diuretics- STABLE.   #A. Fib-amiodarone/Cardizem as per cardiology-  Not on anti-coagulation [?  Hemorrhagic pericardial effusion]-STABLE; consider anticoagulation at next visit.   # PBG- 194- off pre-meds- steroids; [prediabetes]-- cut carbs; monitor closely.   # COVID VACCINATION: Un-vaccinated; DECLINES. Discussed [facility]- pt will let us know if interested.   # DISPOSITION: # treatment today # labs- in 10 days-MD;  Labs- cbc/bmp;possible IVFs.  # follow up in 3 weeks- MD; labs- cbc/cmp; carbo-alimta-keytruda; CT CAP; MRI Brain-Dr.B      All questions were answered. The patient knows to call the clinic with any problems,  questions or concerns.    Cammie Sickle, MD 06/07/2021 9:27 AM

## 2021-06-07 NOTE — Assessment & Plan Note (Addendum)
#  Stage IV-lung cancer-adenocarcinoma. AUG 2022- MRI brain-subcentimeter brain lesions [asymptomatic-see below]  # Proceed with carbo Alimta Keytruda cycle #3. Labs today reviewed;  acceptable for treatment today.  We will plan imaging after 3 cycles/ scans ordered.   #Subcentimeter brain lesions-clinically asymptomatic question cognitive defects as per daughter. Declined WBRT for now; will repeat MRI brain.   # Malignant pericardial tamponade-s/p pericardiocentesis;-secondary to lung adenocarcinoma-repeat AUG 2022-2D echo negative for any pericardial effusion- STABLE.    # chronic respiratory failure-multifactorial-malignancy/lymphangitic spread/COPD stable-significant provement s/p/bronchodilators/diuretics- STABLE.   #A. Fib-amiodarone/Cardizem as per cardiology-  Not on anti-coagulation [?  Hemorrhagic pericardial effusion]-STABLE; consider anticoagulation at next visit.   # PBG- 194- off pre-meds- steroids; [prediabetes]-- cut carbs; monitor closely.   # COVID VACCINATION: Un-vaccinated; DECLINES. Discussed [facility]- pt will let us know if interested.   # DISPOSITION: # treatment today # labs- in 10 days-MD;  Labs- cbc/bmp;possible IVFs.  # follow up in 3 weeks- MD; labs- cbc/cmp; carbo-alimta-keytruda; CT CAP; MRI Brain-Dr.B

## 2021-06-11 ENCOUNTER — Encounter: Payer: Self-pay | Admitting: Internal Medicine

## 2021-06-11 ENCOUNTER — Other Ambulatory Visit: Payer: Self-pay

## 2021-06-13 ENCOUNTER — Encounter: Payer: Self-pay | Admitting: Internal Medicine

## 2021-06-18 ENCOUNTER — Ambulatory Visit: Payer: Self-pay

## 2021-06-18 ENCOUNTER — Inpatient Hospital Stay: Payer: Medicaid Other

## 2021-06-18 ENCOUNTER — Other Ambulatory Visit: Payer: Self-pay

## 2021-06-18 ENCOUNTER — Other Ambulatory Visit: Payer: Self-pay | Admitting: *Deleted

## 2021-06-18 ENCOUNTER — Inpatient Hospital Stay: Payer: Medicaid Other | Attending: Internal Medicine

## 2021-06-18 VITALS — BP 140/85 | HR 77 | Temp 98.1°F | Resp 18

## 2021-06-18 DIAGNOSIS — Z5111 Encounter for antineoplastic chemotherapy: Secondary | ICD-10-CM | POA: Diagnosis present

## 2021-06-18 DIAGNOSIS — Z95828 Presence of other vascular implants and grafts: Secondary | ICD-10-CM

## 2021-06-18 DIAGNOSIS — Z452 Encounter for adjustment and management of vascular access device: Secondary | ICD-10-CM | POA: Insufficient documentation

## 2021-06-18 DIAGNOSIS — Z5112 Encounter for antineoplastic immunotherapy: Secondary | ICD-10-CM | POA: Insufficient documentation

## 2021-06-18 DIAGNOSIS — J9610 Chronic respiratory failure, unspecified whether with hypoxia or hypercapnia: Secondary | ICD-10-CM | POA: Insufficient documentation

## 2021-06-18 DIAGNOSIS — C3411 Malignant neoplasm of upper lobe, right bronchus or lung: Secondary | ICD-10-CM | POA: Insufficient documentation

## 2021-06-18 DIAGNOSIS — I3131 Malignant pericardial effusion in diseases classified elsewhere: Secondary | ICD-10-CM | POA: Insufficient documentation

## 2021-06-18 DIAGNOSIS — C7931 Secondary malignant neoplasm of brain: Secondary | ICD-10-CM

## 2021-06-18 LAB — BASIC METABOLIC PANEL
Anion gap: 7 (ref 5–15)
BUN: 9 mg/dL (ref 6–20)
CO2: 27 mmol/L (ref 22–32)
Calcium: 9.2 mg/dL (ref 8.9–10.3)
Chloride: 105 mmol/L (ref 98–111)
Creatinine, Ser: 0.52 mg/dL (ref 0.44–1.00)
GFR, Estimated: >60 mL/min (ref >60)
Glucose, Bld: 123 mg/dL — ABNORMAL HIGH (ref 70–99)
Potassium: 3.7 mmol/L (ref 3.5–5.1)
Sodium: 139 mmol/L (ref 135–145)

## 2021-06-18 LAB — CBC WITH DIFFERENTIAL/PLATELET
Abs Immature Granulocytes: 0.01 10*3/uL (ref 0.00–0.07)
Basophils Absolute: 0 10*3/uL (ref 0.0–0.1)
Basophils Relative: 0 %
Eosinophils Absolute: 0.1 10*3/uL (ref 0.0–0.5)
Eosinophils Relative: 1 %
HCT: 32.8 % — ABNORMAL LOW (ref 36.0–46.0)
Hemoglobin: 11 g/dL — ABNORMAL LOW (ref 12.0–15.0)
Immature Granulocytes: 0 %
Lymphocytes Relative: 43 %
Lymphs Abs: 1.7 10*3/uL (ref 0.7–4.0)
MCH: 29.6 pg (ref 26.0–34.0)
MCHC: 33.5 g/dL (ref 30.0–36.0)
MCV: 88.2 fL (ref 80.0–100.0)
Monocytes Absolute: 0.8 10*3/uL (ref 0.1–1.0)
Monocytes Relative: 20 %
Neutro Abs: 1.4 10*3/uL — ABNORMAL LOW (ref 1.7–7.7)
Neutrophils Relative %: 36 %
Platelets: 105 10*3/uL — ABNORMAL LOW (ref 150–400)
RBC: 3.72 MIL/uL — ABNORMAL LOW (ref 3.87–5.11)
RDW: 16.7 % — ABNORMAL HIGH (ref 11.5–15.5)
Smear Review: NORMAL
WBC: 4 10*3/uL (ref 4.0–10.5)
nRBC: 0 % (ref 0.0–0.2)

## 2021-06-18 MED ORDER — HEPARIN SOD (PORK) LOCK FLUSH 100 UNIT/ML IV SOLN
500.0000 [IU] | Freq: Once | INTRAVENOUS | Status: AC
  Filled 2021-06-18: qty 5

## 2021-06-18 MED ORDER — SODIUM CHLORIDE 0.9 % IV SOLN
Freq: Once | INTRAVENOUS | Status: AC
  Filled 2021-06-18: qty 250

## 2021-06-18 MED ORDER — HEPARIN SOD (PORK) LOCK FLUSH 100 UNIT/ML IV SOLN
INTRAVENOUS | Status: AC
  Administered 2021-06-18: 500 [IU] via INTRAVENOUS
  Filled 2021-06-18: qty 5

## 2021-06-18 NOTE — Progress Notes (Signed)
No blood return noted with port access. Port flushes well with no pain or edema noted to site. Dr. Rogue Bussing made aware. Per Dr. Rogue Bussing, okay to proceed with IVFs via port. Patient is to have [employer] scheduled.   Patient reports abdominal discomfort that comes and goes. Reports she notices it after eating and feels that it is related to that. Patient denies any discomfort at this time. Advised patient to keep a diary of what she eats and document if it cause GI upset. Patient verbalizes understanding and denies any further questions or concerns.

## 2021-06-19 ENCOUNTER — Ambulatory Visit
Admission: RE | Admit: 2021-06-19 | Discharge: 2021-06-19 | Disposition: A | Discharge status: Home / Self Care | Payer: Medicaid Other | Source: Ambulatory Visit | Attending: Internal Medicine | Admitting: Internal Medicine

## 2021-06-19 DIAGNOSIS — Z452 Encounter for adjustment and management of vascular access device: Secondary | ICD-10-CM | POA: Diagnosis present

## 2021-06-19 HISTORY — PX: IR CV LINE INJECTION: IMG2294

## 2021-06-19 MED ORDER — IOHEXOL 350 MG/ML SOLN
10.0000 mL | Freq: Once | INTRAVENOUS | Status: AC | PRN
  Administered 2021-06-19: 10 mL via INTRAVENOUS
  Filled 2021-06-19: qty 10

## 2021-06-19 MED ORDER — HEPARIN SOD (PORK) LOCK FLUSH 100 UNIT/ML IV SOLN
INTRAVENOUS | Status: AC
  Administered 2021-06-19: 500 [IU]
  Filled 2021-06-19: qty 5

## 2021-06-21 ENCOUNTER — Other Ambulatory Visit: Payer: Self-pay

## 2021-06-21 ENCOUNTER — Ambulatory Visit (INDEPENDENT_AMBULATORY_CARE_PROVIDER_SITE_OTHER): Payer: Self-pay

## 2021-06-21 DIAGNOSIS — I3131 Malignant pericardial effusion in diseases classified elsewhere: Secondary | ICD-10-CM

## 2021-06-22 LAB — ECHOCARDIOGRAM LIMITED
Calc EF: 68.2 %
S' Lateral: 2.8 cm
Single Plane A2C EF: 70.3 %
Single Plane A4C EF: 70.6 %

## 2021-06-26 ENCOUNTER — Telehealth: Payer: Self-pay | Admitting: *Deleted

## 2021-06-26 NOTE — Telephone Encounter (Signed)
Attempted to call pt with results. No answer. Lmtcb.

## 2021-06-26 NOTE — Telephone Encounter (Signed)
-----   Message from Nelva Bush, MD sent at 06/22/2021  7:15 PM EDT ----- Please let Nichole Park know that her echocardiogram shows that her heart is contracting well with minimal fluid around her heart (pericardial effusion).  Overall, the results are reassuring.  She should continue her current medications and follow-up as previously arranged.

## 2021-06-27 NOTE — Telephone Encounter (Signed)
Attempted to call pt x 2. No answer. Lmtcb.

## 2021-06-28 ENCOUNTER — Encounter: Payer: Self-pay | Admitting: Internal Medicine

## 2021-06-28 ENCOUNTER — Inpatient Hospital Stay (HOSPITAL_BASED_OUTPATIENT_CLINIC_OR_DEPARTMENT_OTHER): Payer: Medicaid Other | Admitting: Internal Medicine

## 2021-06-28 ENCOUNTER — Other Ambulatory Visit: Payer: Self-pay | Admitting: *Deleted

## 2021-06-28 ENCOUNTER — Inpatient Hospital Stay: Payer: Medicaid Other

## 2021-06-28 ENCOUNTER — Other Ambulatory Visit: Payer: Self-pay

## 2021-06-28 ENCOUNTER — Telehealth: Payer: Self-pay | Admitting: Internal Medicine

## 2021-06-28 DIAGNOSIS — Z95828 Presence of other vascular implants and grafts: Secondary | ICD-10-CM

## 2021-06-28 DIAGNOSIS — Z5112 Encounter for antineoplastic immunotherapy: Secondary | ICD-10-CM | POA: Diagnosis not present

## 2021-06-28 DIAGNOSIS — C3411 Malignant neoplasm of upper lobe, right bronchus or lung: Secondary | ICD-10-CM

## 2021-06-28 DIAGNOSIS — I3131 Malignant pericardial effusion in diseases classified elsewhere: Secondary | ICD-10-CM

## 2021-06-28 LAB — CBC WITH DIFFERENTIAL/PLATELET
Abs Immature Granulocytes: 0.15 10*3/uL — ABNORMAL HIGH (ref 0.00–0.07)
Basophils Absolute: 0.1 10*3/uL (ref 0.0–0.1)
Basophils Relative: 1 %
Eosinophils Absolute: 0.2 10*3/uL (ref 0.0–0.5)
Eosinophils Relative: 3 %
HCT: 35.3 % — ABNORMAL LOW (ref 36.0–46.0)
Hemoglobin: 11.6 g/dL — ABNORMAL LOW (ref 12.0–15.0)
Immature Granulocytes: 2 %
Lymphocytes Relative: 41 %
Lymphs Abs: 2.6 10*3/uL (ref 0.7–4.0)
MCH: 30.5 pg (ref 26.0–34.0)
MCHC: 32.9 g/dL (ref 30.0–36.0)
MCV: 92.9 fL (ref 80.0–100.0)
Monocytes Absolute: 0.9 10*3/uL (ref 0.1–1.0)
Monocytes Relative: 14 %
Neutro Abs: 2.5 10*3/uL (ref 1.7–7.7)
Neutrophils Relative %: 39 %
Platelets: 390 10*3/uL (ref 150–400)
RBC: 3.8 MIL/uL — ABNORMAL LOW (ref 3.87–5.11)
RDW: 19.7 % — ABNORMAL HIGH (ref 11.5–15.5)
Smear Review: NORMAL
WBC: 6.5 10*3/uL (ref 4.0–10.5)
nRBC: 0 % (ref 0.0–0.2)

## 2021-06-28 LAB — COMPREHENSIVE METABOLIC PANEL
ALT: 31 U/L (ref 0–44)
AST: 39 U/L (ref 15–41)
Albumin: 3.7 g/dL (ref 3.5–5.0)
Alkaline Phosphatase: 81 U/L (ref 38–126)
Anion gap: 7 (ref 5–15)
BUN: 11 mg/dL (ref 6–20)
CO2: 28 mmol/L (ref 22–32)
Calcium: 9.4 mg/dL (ref 8.9–10.3)
Chloride: 104 mmol/L (ref 98–111)
Creatinine, Ser: 0.55 mg/dL (ref 0.44–1.00)
GFR, Estimated: >60 mL/min (ref >60)
Glucose, Bld: 144 mg/dL — ABNORMAL HIGH (ref 70–99)
Potassium: 3.7 mmol/L (ref 3.5–5.1)
Sodium: 139 mmol/L (ref 135–145)
Total Bilirubin: 0.3 mg/dL (ref 0.3–1.2)
Total Protein: 7.5 g/dL (ref 6.5–8.1)

## 2021-06-28 MED ORDER — PALONOSETRON HCL INJECTION 0.25 MG/5ML
0.2500 mg | Freq: Once | INTRAVENOUS | Status: AC
Start: 2021-06-28 — End: 2021-06-28
  Administered 2021-06-28: 0.25 mg via INTRAVENOUS
  Filled 2021-06-28: qty 5

## 2021-06-28 MED ORDER — DEXAMETHASONE SODIUM PHOSPHATE 10 MG/ML IJ SOLN
4.0000 mg | Freq: Once | INTRAMUSCULAR | Status: AC
  Administered 2021-06-28: 4 mg via INTRAVENOUS
  Filled 2021-06-28: qty 1

## 2021-06-28 MED ORDER — SODIUM CHLORIDE 0.9 % IV SOLN
465.0000 mg | Freq: Once | INTRAVENOUS | Status: AC
  Administered 2021-06-28: 470 mg via INTRAVENOUS
  Filled 2021-06-28: qty 47

## 2021-06-28 MED ORDER — SODIUM CHLORIDE 0.9 % IV SOLN
Freq: Once | INTRAVENOUS | Status: AC
  Filled 2021-06-28: qty 250

## 2021-06-28 MED ORDER — SODIUM CHLORIDE 0.9 % IV SOLN
500.0000 mg/m2 | Freq: Once | INTRAVENOUS | Status: AC
  Administered 2021-06-28: 800 mg via INTRAVENOUS
  Filled 2021-06-28: qty 20

## 2021-06-28 MED ORDER — SODIUM CHLORIDE 0.9 % IV SOLN
200.0000 mg | Freq: Once | INTRAVENOUS | Status: AC
  Administered 2021-06-28: 200 mg via INTRAVENOUS
  Filled 2021-06-28: qty 8

## 2021-06-28 MED ORDER — SODIUM CHLORIDE 0.9% FLUSH
10.0000 mL | Freq: Once | INTRAVENOUS | Status: AC
  Administered 2021-06-28: 10 mL via INTRAVENOUS
  Filled 2021-06-28: qty 10

## 2021-06-28 MED ORDER — SODIUM CHLORIDE 0.9 % IV SOLN: 4.0000 mg | Freq: Once | INTRAVENOUS | Status: DC

## 2021-06-28 MED ORDER — HEPARIN SOD (PORK) LOCK FLUSH 100 UNIT/ML IV SOLN
500.0000 [IU] | Freq: Once | INTRAVENOUS | Status: DC
  Filled 2021-06-28: qty 5

## 2021-06-28 MED ORDER — HEPARIN SOD (PORK) LOCK FLUSH 100 UNIT/ML IV SOLN
INTRAVENOUS | Status: AC
  Filled 2021-06-28: qty 5

## 2021-06-28 MED ORDER — SODIUM CHLORIDE 0.9 % IV SOLN
150.0000 mg | Freq: Once | INTRAVENOUS | Status: AC
  Administered 2021-06-28: 150 mg via INTRAVENOUS
  Filled 2021-06-28: qty 150

## 2021-06-28 NOTE — Telephone Encounter (Signed)
error 

## 2021-06-28 NOTE — Assessment & Plan Note (Addendum)
#  Stage IV-lung cancer-adenocarcinoma. AUG 2022- MRI brain-subcentimeter brain lesions [asymptomatic-see below]  # Proceed with carbo Alimta Keytruda cycle #4. Labs today reviewed;  acceptable for treatment today.  CT pending on 10/24 [pt pref].  PD-L1 -0; consider combination Alimta Keytruda maintenance.  We will discuss further.  #Subcentimeter brain lesions-clinically asymptomatic question cognitive defects as per daughter. Declined WBRT for now; will repeat MRI brain- 11/21 [pt pref].   # Malignant pericardial tamponade-s/p pericardiocentesis;-secondary to lung adenocarcinoma-repeat AUG 2022-2D echo negative for any pericardial effusion- STABLE.    # chronic respiratory failure-multifactorial-malignancy/lymphangitic spread/COPD stable-significant provement s/p/bronchodilators/diuretics- STABLE.  #A. Fib-amiodarone/Cardizem as per cardiology-  Not on anti-coagulation [?  Hemorrhagic pericardial effusion]-STABLE; ? anticoagulation at next visit.   # PBG- 144- off pre-meds- steroids; [prediabetes]-- cut carbs; monitor closely.   # COVID VACCINATION: Un-vaccinated; DECLINES/declined- EVUSHELD.   # Port malfunction: STABLE.   # DISPOSITION: # treatment today # labs- in 10 days-MD;  Labs- cbc/bmp;possible IVFs.  # follow up in 3 weeks- MD; labs- cbc/cmp; Alimta-keytruda--Dr.B

## 2021-06-28 NOTE — Progress Notes (Signed)
[name] CONSULT NOTE  Patient Care Team: Practice, [facility] (Inactive) as PCP - General End, Harrell Gave, MD as PCP - Cardiology (Cardiology) Telford Nab, RN as Oncology Nurse Navigator  CHIEF COMPLAINTS/PURPOSE OF CONSULTATION: Lung cancer  #  Oncology History Overview Note  # AUG 2022-stage IV adenocarcinoma of the lung [s/p pericardial effusion/tamponade]. MRI Brain AUG 16th, 2022- IMPRESSION: Multiple peripherally enhancing, centrally T1 hypointense lesions, with surrounding T2 hyperintense signal in the bilateral cerebral hemispheres, concerning for metastatic disease.    # AUG 17th 2022- CARBO-ALIMTA-Keytruda #1  #Acute respiratory failure BiPAP/ICU.  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 Nichole Park 60 y.o.  female  stage IV adenocarcinoma the lung cancer is here currently on chemotherapy-immunotherapy is here for a follow up.    Denies any worsening shortness of breath or cough.  No fever no chills.  Appetite improving.  Patient moved her scans out/on preference.  Review of Systems  Constitutional:  Positive for malaise/fatigue. Negative for chills, diaphoresis and fever.  HENT:  Negative for nosebleeds and sore throat.   Eyes:  Negative for double vision.  Respiratory:  Positive for shortness of breath. Negative for cough, hemoptysis,  sputum production and wheezing.   Cardiovascular:  Negative for chest pain, palpitations, orthopnea and leg swelling.  Gastrointestinal:  Negative for abdominal pain, blood in stool, constipation, diarrhea, heartburn, melena, nausea and vomiting.  Genitourinary:  Negative for dysuria, frequency and urgency.  Musculoskeletal:  Negative for back pain and joint pain.  Skin: Negative.  Negative for itching and rash.  Neurological:  Negative for dizziness, tingling, focal weakness, weakness and headaches.  Endo/Heme/Allergies:  Does not bruise/bleed easily.  Psychiatric/Behavioral:  Negative for depression. The patient is not nervous/anxious and does not have insomnia.     MEDICAL HISTORY:  Past Medical History:  Diagnosis Date   Graves disease    Malignant pericardial effusion    Paroxysmal atrial fibrillation ([place])    Primary lung adenocarcinoma ([place])     SURGICAL HISTORY: Past Surgical History:  Procedure Laterality Date   IR CV LINE INJECTION  06/19/2021   IR IMAGING GUIDED PORT INSERTION  04/27/2021   PERICARDIOCENTESIS N/A 03/27/2021   Procedure: PERICARDIOCENTESIS;  Surgeon: Nelva Bush, MD;  Location: [facility];  Service: Cardiovascular;  Laterality: N/A;   TUBAL LIGATION      SOCIAL HISTORY: Social History   Socioeconomic History   Marital status: Widowed    Spouse name: Not on file   Number of children: Not on file   Years of education: Not on file   Highest education level: Not on file  Occupational History   Not on file  Tobacco Use   Smoking status: Former    Packs/day: 1.00    Years: 40.00    Pack years: 40.00    Types: Cigarettes   Smokeless tobacco:  Never  Vaping Use   Vaping Use: Never used  Substance and Sexual Activity   Alcohol use: No   Drug use: Not Currently    Types: Marijuana   Sexual activity: Not on file  Other Topics Concern   Not on file  Social History Narrative   Not on file   Social Determinants of Health    Financial Resource Strain: High Risk   Difficulty of Paying Living Expenses: Very hard  Food Insecurity: Not on file  Transportation Needs: Not on file  Physical Activity: Not on file  Stress: Stress Concern Present   Feeling of Stress : Very much  Social Connections: Not on file  Intimate Partner Violence: Not on file    FAMILY HISTORY: Family History  Problem Relation Age of Onset   Peripheral Artery Disease Mother     ALLERGIES:  is allergic to diazepam, metoprolol, and tapazole [methimazole].  MEDICATIONS:  Current Outpatient Medications  Medication Sig Dispense Refill   amiodarone (PACERONE) 200 MG tablet Take 1 tablet (200 mg total) by mouth once daily. 90 tablet 3   diltiazem (DILACOR XR) 240 MG 24 hr capsule Take 1 capsule (240 mg total) by mouth once daily. 90 capsule 3   folic acid (FOLVITE) 1 MG tablet Take 1 tablet (1 mg total) by mouth once daily. 90 tablet 1   lidocaine-prilocaine (EMLA) cream Apply 1 application topically daily as needed. 30 g 0   Multiple Vitamin (MULTI-VITAMINS) TABS Take 1 tablet by mouth daily.     nystatin (MYCOSTATIN) 100000 UNIT/ML suspension Swish and spit 5 mLs (500,000 Units total) by mouth once every 8 (eight) hours as needed. 150 mL 0   albuterol (VENTOLIN HFA) 108 (90 Base) MCG/ACT inhaler Inhale 2 puffs into the lungs every 6 (six) hours as needed for wheezing or shortness of breath. (Patient not taking: Reported on 06/28/2021) 8.5 g 0   ondansetron (ZOFRAN) 4 MG tablet Take 2 tablets (8mg  total) by mouth once every 8 hours as needed for nausea and vomiting. (Patient not taking: Reported on 06/28/2021) 80 tablet 1   prochlorperazine (COMPAZINE) 10 MG tablet Take 1 tablet (10 mg total) by mouth once every 6 (six) hours as needed for nausea or vomiting. (Patient not taking: Reported on 06/28/2021) 40 tablet 1   No current facility-administered medications for this visit.      Marland Kitchen  PHYSICAL EXAMINATION: ECOG PERFORMANCE STATUS: 1 -  Symptomatic but completely ambulatory  Vitals:   06/28/21 0836  BP: 130/89  Pulse: 91  Resp: 18  Temp: 97.6 F (36.4 C)  SpO2: 98%   Filed Weights   06/28/21 0836  Weight: 136 lb (61.7 kg)    Physical Exam Vitals and nursing note reviewed.  HENT:     Head: Normocephalic and atraumatic.     Mouth/Throat:     Pharynx: Oropharynx is clear.  Eyes:     Extraocular Movements: Extraocular movements intact.     Pupils: Pupils are equal, round, and reactive to light.  Cardiovascular:     Rate and Rhythm: Normal rate and regular rhythm.  Pulmonary:     Comments: Decreased breath sounds bilaterally.  Abdominal:     Palpations: Abdomen is soft.  Musculoskeletal:        General: Normal range of motion.     Cervical back: Normal range of motion.  Skin:    General: Skin is warm.  Neurological:     General: No focal deficit present.     Mental Status:  She is alert and oriented to person, place, and time.  Psychiatric:        Behavior: Behavior normal.        Judgment: Judgment normal.     LABORATORY DATA:  I have reviewed the data as listed Lab Results  Component Value Date   WBC 6.5 06/28/2021   HGB 11.6 (L) 06/28/2021   HCT 35.3 (L) 06/28/2021   MCV 92.9 06/28/2021   PLT 390 06/28/2021   Recent Labs    03/27/21 1635 03/28/21 0131 05/28/21 0827 06/07/21 0820 06/18/21 0945 06/28/21 0804  NA  --    < > 137 139 139 139  K  --    < > 3.8 3.8 3.7 3.7  CL  --    < > 101 106 105 104  CO2  --    < > 27 25 27 28   GLUCOSE  --    < > 146* 194* 123* 144*  BUN  --    < > 8 13 9 11   CREATININE  --    < > 0.53 0.63 0.52 0.55  CALCIUM  --    < > 9.4 9.5 9.2 9.4  GFRNONAA  --    < > >60 >60 >60 >60  PROT 7.7   < > 7.3 7.5  --  7.5  ALBUMIN 3.2*   < > 3.2* 3.5  --  3.7  AST 31   < > 42* 55*  --  39  ALT 21   < > 39 41  --  31  ALKPHOS 74   < > 96 89  --  81  BILITOT 1.2   < > 0.7 0.4  --  0.3  BILIDIR 0.4*  --   --   --   --   --   IBILI 0.8  --   --   --   --   --    <  > = values in this interval not displayed.    RADIOGRAPHIC STUDIES: I have personally reviewed the radiological images as listed and agreed with the findings in the report. IR CV Line Injection  Result Date: 06/19/2021 CLINICAL DATA:  History of lung carcinoma and status post Port-A-Cath placement on 04/27/2021. There has been difficulty aspirating blood from the port after access. EXAM: CONTRAST INJECTION OF PORT A CATH UNDER FLUOROSCOPY CONTRAST:  10 mL Omnipaque 350 FLUOROSCOPY TIME:  12 seconds.  0.8 mGy PROCEDURE: Contrast was administered via the indwelling port after it was accessed. Fluoroscopic spot images were obtained of the catheter during injection. FINDINGS: The port placed via the right internal jugular vein is well positioned under fluoroscopy with the catheter tip located at the SVC/RA junction. With contrast injection, the port reservoir and attached catheter are patent and intact without evidence of contrast extravasation. There is prominent fibrin sheath formation along the distal several cm of the catheter within the SVC severely limiting outflow of contrast from the distal tip of the catheter. Contrast preferentially exits a perforation of the fibrin sheath along its medial aspect in the SVC several cm proximal to the tip of the port catheter. Injected contrast material freely streams and flows in the SVC and right atrium. IMPRESSION: Well positioned and intact Port-A-Cath with prominent fibrin sheath surrounding the distal several cm of catheter within the SVC. Contrast preferentially exits a perforation of the fibrin sheath along its medial aspect several cm proximal to the tip of the catheter. TPA dwells are recommended as a first  line in determining whether ability to aspirate blood can be reestablished. Injected contrast does flow freely into the SVC and right atrium. Electronically Signed   By: Aletta Edouard M.D.   On: 06/19/2021 14:59   ECHOCARDIOGRAM LIMITED  Result Date:  06/22/2021    ECHOCARDIOGRAM LIMITED REPORT   Patient Name:   Nichole Park Date of Exam: 06/21/2021 Medical Rec #:  [id number]   Height:       61.0 in Accession #:    [id number]  Weight:       132.7 lb Date of Birth:  Aug 17, 1961  BSA:          1.587 m Patient Age:    33 years    BP:           125/78 mmHg Patient Gender: F           HR:           81 bpm. Exam Location:   Procedure: 2D Echo, Limited Echo and Limited Color Doppler Indications:    I31.3 Pericardial effusion (noninflammatory)  History:        Patient has prior history of Echocardiogram examinations, most                 recent 04/02/2021. COPD, Arrythmias:Atrial Fibrillation and                 Tachycardia, Signs/Symptoms:Shortness of Breath; Risk                 Factors:Hypertension and Former Smoker.  Sonographer:    Caesar Chestnut RDCS, RVT Referring Phys: [place]  1. Left ventricular ejection fraction, by estimation, is 60 to 65%. The left ventricle has normal function. The left ventricle has no regional wall motion abnormalities.  2. Right ventricular systolic function is normal. The right ventricular size is normal. There is normal pulmonary artery systolic pressure. The estimated right ventricular systolic pressure is 41.9 mmHg.  3. Trivial pericardial effusion is present. Previously noted echogenic material in pericardial space has essentially resolved. Comparison(s): LVEF >55%. FINDINGS  Left Ventricle: Left ventricular ejection fraction, by estimation, is 60 to 65%. The left ventricle has normal function. The left ventricle has no regional wall motion abnormalities. The left ventricular internal cavity size was normal in size. There is  no left ventricular hypertrophy. Right Ventricle: The right ventricular size is normal. No increase in right ventricular wall thickness. Right ventricular systolic function is normal. There is normal pulmonary artery systolic pressure. The tricuspid regurgitant velocity is 2.55  m/s, and  with an assumed right atrial pressure of 5 mmHg, the estimated right ventricular systolic pressure is 37.9 mmHg. Left Atrium: Left atrial size was normal in size. Right Atrium: Right atrial size was normal in size. Pericardium: Trivial pericardial effusion is present. Previously noted echogenic material in pericardial space has essentially resolved. Mitral Valve: The mitral valve is normal in structure. No evidence of mitral valve stenosis. Tricuspid Valve: The tricuspid valve is normal in structure. Tricuspid valve regurgitation is trivial. No evidence of tricuspid stenosis. Aortic Valve: The aortic valve was not well visualized. Aortic valve regurgitation is not visualized. No aortic stenosis is present. Pulmonic Valve: The pulmonic valve was normal in structure. Pulmonic valve regurgitation is not visualized. No evidence of pulmonic stenosis. Aorta: The aortic root is normal in size and structure. Venous: The inferior vena cava is normal in size with greater than 50% respiratory variability, suggesting right atrial pressure of 3 mmHg. IAS/Shunts: No  atrial level shunt detected by color flow Doppler. LEFT VENTRICLE PLAX 2D LVIDd:         4.50 cm LVIDs:         2.80 cm LV PW:         0.80 cm LV IVS:        0.70 cm  LV Volumes (MOD) LV vol d, MOD A2C: 39.4 ml LV vol d, MOD A4C: 70.1 ml LV vol s, MOD A2C: 11.7 ml LV vol s, MOD A4C: 20.6 ml LV SV MOD A2C:     27.7 ml LV SV MOD A4C:     70.1 ml LV SV MOD BP:      38.0 ml TRICUSPID VALVE TR Peak grad:   26.0 mmHg TR Vmax:        255.00 cm/s Ida Rogue MD Electronically signed by Ida Rogue MD Signature Date/Time: 06/22/2021/6:36:10 PM    Final     ASSESSMENT & PLAN:   Cancer of upper lobe of right lung ([place]) #Stage IV-lung cancer-adenocarcinoma. AUG 2022- MRI brain-subcentimeter brain lesions [asymptomatic-see below]  # Proceed with carbo Alimta Keytruda cycle #4. Labs today reviewed;  acceptable for treatment today.  CT pending on 10/24 [pt  pref].   #Subcentimeter brain lesions-clinically asymptomatic question cognitive defects as per daughter. Declined WBRT for now; will repeat MRI brain- 11/21 [pt pref].   # Malignant pericardial tamponade-s/p pericardiocentesis;-secondary to lung adenocarcinoma-repeat AUG 2022-2D echo negative for any pericardial effusion- STABLE.    # chronic respiratory failure-multifactorial-malignancy/lymphangitic spread/COPD stable-significant provement s/p/bronchodilators/diuretics- STABLE.  #A. Fib-amiodarone/Cardizem as per cardiology-  Not on anti-coagulation [?  Hemorrhagic pericardial effusion]-STABLE; ? anticoagulation at next visit.   # PBG- 144- off pre-meds- steroids; [prediabetes]-- cut carbs; monitor closely.   # COVID VACCINATION: Un-vaccinated; DECLINES/declined- EVUSHELD.   # Port malfunction: STABLE.   # DISPOSITION: # treatment today # labs- in 10 days-MD;  Labs- cbc/bmp;possible IVFs.  # follow up in 3 weeks- MD; labs- cbc/cmp; Alimta-keytruda--Dr.B      All questions were answered. The patient knows to call the clinic with any problems, questions or concerns.    Cammie Sickle, MD 06/28/2021 6:35 PM

## 2021-06-28 NOTE — Telephone Encounter (Signed)
I spoke with the patient regarding her echo results and Dr. Darnelle Bos recommendations to follow up as scheduled on 07/04/21.  The patient voices understanding and is agreeable.

## 2021-06-28 NOTE — Progress Notes (Signed)
Nutrition Follow-up:  Patient with stage IV lung adenocarcinoma with brain lesions.  Patient receiving carboplatin, alimta, keytruda.   Met with patient during infusion.  Patient reports that her appetite is good.  Eating 3 meals per day and eating snacks.  Eating good sources of protein chicken, fish, [place], peanut butter.  Patient eating organic fruits and vegetables.  Drinking orgain shakes.  Reports no nausea and regular bowel movements.      Medications: reviewed  Labs: reviewed  Anthropometrics:   Weight 136 lb today  132 lb 11.2 oz on 9/29 129 lb on 9/8 124 lb on 8/17   NUTRITION DIAGNOSIS:  Increased nutrient needs continue with treatment   INTERVENTION:  Patient to continue eating well balanced diet including lean protein and plant foods.   Coupons given for orgain shakes    MONITORING, EVALUATION, GOAL: weight trends, intake   NEXT VISIT: as needed  Nichole Park, [place], [place] Registered Dietitian [phone] (mobile)

## 2021-06-28 NOTE — Patient Instructions (Signed)
[facility]  Discharge Instructions: Thank you for choosing [facility] to provide your oncology and hematology care.  If you have a lab appointment with the [facility], please go directly to the [facility] and check in at the registration area.  Wear comfortable clothing and clothing appropriate for easy access to any Portacath or PICC line.   We strive to give you quality time with your provider. You may need to reschedule your appointment if you arrive late (15 or more minutes).  Arriving late affects you and other patients whose appointments are after yours.  Also, if you miss three or more appointments without notifying the office, you may be dismissed from the clinic at the provider's discretion.      For prescription refill requests, have your pharmacy contact our office and allow 72 hours for refills to be completed.    Today you received the following chemotherapy and/or immunotherapy agents : Keytruda / Alimta / Carboplatin   To help prevent nausea and vomiting after your treatment, we encourage you to take your nausea medication as directed.  BELOW ARE SYMPTOMS THAT SHOULD BE REPORTED IMMEDIATELY: *FEVER GREATER THAN 100.4 F (38 C) OR HIGHER *CHILLS OR SWEATING *NAUSEA AND VOMITING THAT IS NOT CONTROLLED WITH YOUR NAUSEA MEDICATION *UNUSUAL SHORTNESS OF BREATH *UNUSUAL BRUISING OR BLEEDING *URINARY PROBLEMS (pain or burning when urinating, or frequent urination) *BOWEL PROBLEMS (unusual diarrhea, constipation, pain near the anus) TENDERNESS IN MOUTH AND THROAT WITH OR WITHOUT PRESENCE OF ULCERS (sore throat, sores in mouth, or a toothache) UNUSUAL RASH, SWELLING OR PAIN  UNUSUAL VAGINAL DISCHARGE OR ITCHING   Items with * indicate a potential emergency and should be followed up as soon as possible or go to the Emergency Department if any problems should occur.  Please show the CHEMOTHERAPY ALERT CARD or IMMUNOTHERAPY  ALERT CARD at check-in to the Emergency Department and triage nurse.  Should you have questions after your visit or need to cancel or reschedule your appointment, please contact [facility]  [phone] and follow the prompts.  Office hours are 8:00 a.m. to 4:30 p.m. Monday - Friday. Please note that voicemails left after 4:00 p.m. may not be returned until the following business day.  We are closed weekends and major holidays. You have access to a nurse at all times for urgent questions. Please call the main number to the clinic [phone] and follow the prompts.  For any non-urgent questions, you may also contact your provider using MyChart. We now offer e-Visits for anyone 56 and older to request care online for non-urgent symptoms. For details visit [email or web address].   Also download the MyChart app! Go to the app store, search "MyChart", open the app, select [facility], and log in with your MyChart username and password.  Due to Covid, a mask is required upon entering the hospital/clinic. If you do not have a mask, one will be given to you upon arrival. For doctor visits, patients may have 1 support person aged 17 or older with them. For treatment visits, patients cannot have anyone with them due to current Covid guidelines and our immunocompromised population.

## 2021-06-28 NOTE — Telephone Encounter (Signed)
Encounter opened in error

## 2021-07-01 ENCOUNTER — Encounter: Payer: Self-pay | Admitting: Internal Medicine

## 2021-07-02 ENCOUNTER — Ambulatory Visit
Admission: RE | Admit: 2021-07-02 | Discharge: 2021-07-02 | Disposition: A | Discharge status: Home / Self Care | Payer: Medicaid Other | Source: Ambulatory Visit | Attending: Internal Medicine | Admitting: Internal Medicine

## 2021-07-02 ENCOUNTER — Other Ambulatory Visit: Payer: Self-pay

## 2021-07-02 ENCOUNTER — Ambulatory Visit: Payer: MEDICAID

## 2021-07-02 DIAGNOSIS — C3411 Malignant neoplasm of upper lobe, right bronchus or lung: Secondary | ICD-10-CM | POA: Diagnosis present

## 2021-07-02 MED ORDER — IOHEXOL 300 MG/ML  SOLN
100.0000 mL | Freq: Once | INTRAMUSCULAR | Status: AC | PRN
  Administered 2021-07-02: 100 mL via INTRAVENOUS

## 2021-07-03 ENCOUNTER — Encounter: Payer: Self-pay | Admitting: Internal Medicine

## 2021-07-03 NOTE — Progress Notes (Signed)
Called pt on October 25,  I left a voicemail for the patient giving the results of the CT scan That showed improved response from Current therapy.  GB

## 2021-07-04 ENCOUNTER — Encounter: Payer: Self-pay | Admitting: Internal Medicine

## 2021-07-04 ENCOUNTER — Ambulatory Visit (INDEPENDENT_AMBULATORY_CARE_PROVIDER_SITE_OTHER): Payer: Medicaid Other | Admitting: Internal Medicine

## 2021-07-04 ENCOUNTER — Other Ambulatory Visit: Payer: Self-pay

## 2021-07-04 VITALS — BP 110/80 | HR 93 | Ht 61.0 in | Wt 131.0 lb

## 2021-07-04 DIAGNOSIS — I480 Paroxysmal atrial fibrillation: Secondary | ICD-10-CM

## 2021-07-04 DIAGNOSIS — I3131 Malignant pericardial effusion in diseases classified elsewhere: Secondary | ICD-10-CM | POA: Diagnosis not present

## 2021-07-04 NOTE — Patient Instructions (Signed)
Medication Instructions:   Your physician has recommended you make the following change in your medication:   STOP Amiodarone  *If you need a refill on your cardiac medications before your next appointment, please call your pharmacy*   Lab Work:  None ordered  Testing/Procedures:  None ordered   Follow-Up: At [facility], you and your health needs are our priority.  As part of our continuing mission to provide you with exceptional heart care, we have created designated Provider Care Teams.  These Care Teams include your primary Cardiologist (physician) and Advanced Practice Providers (APPs -  Physician Assistants and Nurse Practitioners) who all work together to provide you with the care you need, when you need it.  We recommend signing up for the patient portal called "MyChart".  Sign up information is provided on this After Visit Summary.  MyChart is used to connect with patients for Virtual Visits (Telemedicine).  Patients are able to view lab/test results, encounter notes, upcoming appointments, etc.  Non-urgent messages can be sent to your provider as well.   To learn more about what you can do with MyChart, go to [email or web address].    Your next appointment:   2 month(s)  The format for your next appointment:   In Person  Provider:   You may see Nelva Bush, MD or one of the following Advanced Practice Providers on your designated Care Team:   Murray Hodgkins, NP Christell Faith, PA-C Marrianne Mood, PA-C Cadence Solis, [place]

## 2021-07-04 NOTE — Progress Notes (Signed)
Follow-up Outpatient Visit Date: 07/04/2021  Primary Care Provider: [facility], [facility] (Inactive) [id number] [id number]  Chief Complaint: Follow-up pericardial effusion and paroxysmal atrial fibrillation  HPI:  Nichole Park is a 60 y.o. 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  I last saw her in mid September, at which time she was feeling fairly well, though she noted quite a bit of fatigue and somnolence while undergoing chemotherapy.  Subsequent echo showed trivial pericardial effusion.  Today, Nichole Park reports that she is feeling fairly well.  She still has intermittent fatigue, most pronounced when receiving chemotherapy.  She denies chest pain, palpitations, lightheadedness, and edema.  Breathing has been stable.  --------------------------------------------------------------------------------------------------  Past Medical History:  Diagnosis Date   Graves disease    Malignant pericardial effusion    Paroxysmal atrial fibrillation ([place])    Primary lung adenocarcinoma [facility])    Past Surgical History:  Procedure Laterality Date   IR CV LINE INJECTION  06/19/2021   IR IMAGING GUIDED PORT INSERTION  04/27/2021   PERICARDIOCENTESIS N/A 03/27/2021   Procedure: PERICARDIOCENTESIS;  Surgeon: Nelva Bush, MD;  Location: [facility];  Service: Cardiovascular;  Laterality: N/A;   TUBAL LIGATION      Current Meds  Medication Sig   albuterol (VENTOLIN HFA) 108 (90 Base) MCG/ACT inhaler Inhale 2 puffs into the lungs every 6 (six) hours as needed for wheezing or shortness of breath.   diltiazem (DILACOR XR) 240 MG 24 hr capsule Take 1 capsule (240 mg total) by mouth once daily.   folic acid (FOLVITE) 1 MG tablet  Take 1 tablet (1 mg total) by mouth once daily.   lidocaine-prilocaine (EMLA) cream Apply 1 application topically daily as needed.   Multiple Vitamin (MULTI-VITAMINS) TABS Take 1 tablet by mouth daily.   nystatin (MYCOSTATIN) 100000 UNIT/ML suspension Swish and spit 5 mLs (500,000 Units total) by mouth once every 8 (eight) hours as needed.   ondansetron (ZOFRAN) 4 MG tablet Take 2 tablets (8mg  total) by mouth once every 8 hours as needed for nausea and vomiting.   prochlorperazine (COMPAZINE) 10 MG tablet Take 1 tablet (10 mg total) by mouth once every 6 (six) hours as needed for nausea or vomiting.   [DISCONTINUED] amiodarone (PACERONE) 200 MG tablet Take 1 tablet (200 mg total) by mouth once daily.    Allergies: Diazepam, Metoprolol, and Tapazole [methimazole]  Social History   Tobacco Use   Smoking status: Former    Packs/day: 1.00    Years: 40.00    Pack years: 40.00    Types: Cigarettes   Smokeless tobacco: Never  Vaping Use   Vaping Use: Never used  Substance Use Topics   Alcohol use: No   Drug use: Yes    Types: Marijuana    Family History  Problem Relation Age of Onset   Peripheral Artery Disease Mother     Review of Systems: A 12-system review of systems was performed and was negative except as noted in the HPI.  --------------------------------------------------------------------------------------------------  Physical Exam: BP 110/80 (BP Location: Left Arm, Patient Position: Sitting, Cuff Size: Normal)   Pulse 93   Ht 5\' 1"  (1.549 m)   Wt 131 lb (59.4 kg)   SpO2 97%   BMI 24.75 kg/m   General:  NAD. Neck: No JVD or HJR.  Lungs: Clear to auscultation bilaterally without wheezes or crackles. Heart: Regular rate and rhythm without murmurs, rubs, or gallops. Abdomen: Soft, nontender, nondistended. Extremities: No lower extremity edema.  EKG: Normal sinus rhythm with bifascicular block (RBBB and LPFB) and QT prolongation (QTc 564 ms).  QT has lengthened  slightly since 05/23/2021.  Otherwise, no significant interval change.  Lab Results  Component Value Date   WBC 6.5 06/28/2021   HGB 11.6 (L) 06/28/2021   HCT 35.3 (L) 06/28/2021   MCV 92.9 06/28/2021   PLT 390 06/28/2021    Lab Results  Component Value Date   NA 139 06/28/2021   K 3.7 06/28/2021   CL 104 06/28/2021   CO2 28 06/28/2021   BUN 11 06/28/2021   CREATININE 0.55 06/28/2021   GLUCOSE 144 (H) 06/28/2021   ALT 31 06/28/2021    --------------------------------------------------------------------------------------------------  ASSESSMENT AND PLAN: Malignant pericardial effusion: No symptoms of tamponade reported.  Recent limited echo showed near complete resolution of pericardial effusion with only trivial amount of fluid remaining.  Continue ongoing treatment of lung cancer per oncology.  Paroxysmal atrial fibrillation: Nichole Park is maintaining sinus rhythm without symptoms to suggest recurrence.  Given that her pericardial effusion has resolved and her overall clinical status is much better compared to her hospitalization in July when she had frequent bouts of atrial fibrillation with rapid ventricular response, I think it would be reasonable to discontinue amiodarone.  This will also hopefully help shorten her QT interval.  Continue diltiazem 240 mg daily.  We will continue to defer anticoagulation in the setting of a CHA2DS2-VASc score of 1 (gender) and elevated risk of bleeding in the setting of malignant pericardial effusion/metastatic lung cancer.  Follow-up: Return to clinic in 2 months.  Nelva Bush, MD 07/05/2021 7:28 AM

## 2021-07-05 ENCOUNTER — Other Ambulatory Visit: Payer: Self-pay

## 2021-07-05 ENCOUNTER — Encounter: Payer: Self-pay | Admitting: Internal Medicine

## 2021-07-09 ENCOUNTER — Other Ambulatory Visit: Payer: Self-pay

## 2021-07-09 ENCOUNTER — Inpatient Hospital Stay: Payer: Medicaid Other

## 2021-07-09 ENCOUNTER — Inpatient Hospital Stay (HOSPITAL_BASED_OUTPATIENT_CLINIC_OR_DEPARTMENT_OTHER): Payer: Medicaid Other | Admitting: Internal Medicine

## 2021-07-09 ENCOUNTER — Encounter: Payer: Self-pay | Admitting: Internal Medicine

## 2021-07-09 VITALS — BP 121/79 | HR 82 | Temp 97.4°F | Resp 20 | Ht 61.0 in | Wt 136.7 lb

## 2021-07-09 DIAGNOSIS — C3411 Malignant neoplasm of upper lobe, right bronchus or lung: Secondary | ICD-10-CM

## 2021-07-09 DIAGNOSIS — I3131 Malignant pericardial effusion in diseases classified elsewhere: Secondary | ICD-10-CM

## 2021-07-09 DIAGNOSIS — Z5112 Encounter for antineoplastic immunotherapy: Secondary | ICD-10-CM | POA: Diagnosis not present

## 2021-07-09 DIAGNOSIS — Z95828 Presence of other vascular implants and grafts: Secondary | ICD-10-CM

## 2021-07-09 LAB — CBC WITH DIFFERENTIAL/PLATELET
Abs Immature Granulocytes: 0.01 10*3/uL (ref 0.00–0.07)
Basophils Absolute: 0 10*3/uL (ref 0.0–0.1)
Basophils Relative: 0 %
Eosinophils Absolute: 0 10*3/uL (ref 0.0–0.5)
Eosinophils Relative: 1 %
HCT: 32.3 % — ABNORMAL LOW (ref 36.0–46.0)
Hemoglobin: 11.2 g/dL — ABNORMAL LOW (ref 12.0–15.0)
Immature Granulocytes: 0 %
Lymphocytes Relative: 51 %
Lymphs Abs: 2 10*3/uL (ref 0.7–4.0)
MCH: 31.5 pg (ref 26.0–34.0)
MCHC: 34.7 g/dL (ref 30.0–36.0)
MCV: 91 fL (ref 80.0–100.0)
Monocytes Absolute: 0.9 10*3/uL (ref 0.1–1.0)
Monocytes Relative: 23 %
Neutro Abs: 1 10*3/uL — ABNORMAL LOW (ref 1.7–7.7)
Neutrophils Relative %: 25 %
Platelets: 130 10*3/uL — ABNORMAL LOW (ref 150–400)
RBC: 3.55 MIL/uL — ABNORMAL LOW (ref 3.87–5.11)
RDW: 18.2 % — ABNORMAL HIGH (ref 11.5–15.5)
Smear Review: NORMAL
WBC: 3.9 10*3/uL — ABNORMAL LOW (ref 4.0–10.5)
nRBC: 0 % (ref 0.0–0.2)

## 2021-07-09 LAB — COMPREHENSIVE METABOLIC PANEL
ALT: 32 U/L (ref 0–44)
AST: 34 U/L (ref 15–41)
Albumin: 3.7 g/dL (ref 3.5–5.0)
Alkaline Phosphatase: 72 U/L (ref 38–126)
Anion gap: 8 (ref 5–15)
BUN: 9 mg/dL (ref 6–20)
CO2: 27 mmol/L (ref 22–32)
Calcium: 9.2 mg/dL (ref 8.9–10.3)
Chloride: 102 mmol/L (ref 98–111)
Creatinine, Ser: 0.57 mg/dL (ref 0.44–1.00)
GFR, Estimated: >60 mL/min (ref >60)
Glucose, Bld: 126 mg/dL — ABNORMAL HIGH (ref 70–99)
Potassium: 3.7 mmol/L (ref 3.5–5.1)
Sodium: 137 mmol/L (ref 135–145)
Total Bilirubin: 0.3 mg/dL (ref 0.3–1.2)
Total Protein: 7.2 g/dL (ref 6.5–8.1)

## 2021-07-09 MED ORDER — SODIUM CHLORIDE 0.9% FLUSH
10.0000 mL | Freq: Once | INTRAVENOUS | Status: AC
  Administered 2021-07-09: 10 mL via INTRAVENOUS
  Filled 2021-07-09: qty 10

## 2021-07-09 MED ORDER — HEPARIN SOD (PORK) LOCK FLUSH 100 UNIT/ML IV SOLN
500.0000 [IU] | Freq: Once | INTRAVENOUS | Status: AC
  Administered 2021-07-09: 500 [IU] via INTRAVENOUS
  Filled 2021-07-09: qty 5

## 2021-07-09 NOTE — Progress Notes (Signed)
[facility] CONSULT NOTE  Patient Care Team: Practice, [facility] (Inactive) as PCP - General End, Harrell Gave, MD as PCP - Cardiology (Cardiology) Telford Nab, RN as Oncology Nurse Navigator  CHIEF COMPLAINTS/PURPOSE OF CONSULTATION: Lung cancer  #  Oncology History Overview Note  # AUG 2022-stage IV adenocarcinoma of the lung [s/p pericardial effusion/tamponade]. MRI Brain AUG 16th, 2022- IMPRESSION: Multiple peripherally enhancing, centrally T1 hypointense lesions, with surrounding T2 hyperintense signal in the bilateral cerebral hemispheres, concerning for metastatic disease.    # AUG 17th 2022- CARBO-ALIMTA-Keytruda #1  #Acute respiratory failure BiPAP/ICU.  S/p pericardiocentesis    Malignant pericardial effusion  03/28/2021 Initial Diagnosis   Malignant pericardial effusion ([place])   04/25/2021 -  Chemotherapy   Patient is on Treatment Plan : LUNG CARBOplatin / Pemetrexed / Pembrolizumab q21d Induction x 4 cycles / Maintenance Pemetrexed + Pembrolizumab     Cancer of upper lobe of right lung ([place])  04/10/2021 Initial Diagnosis   Cancer of upper lobe of right lung ([place])   04/10/2021 Cancer Staging   Staging form: Lung, AJCC 8th Edition - Clinical: Stage IVA (cT2, cN2, cM1a) - Signed by Cammie Sickle, MD on 04/10/2021       HISTORY OF PRESENTING ILLNESS: Accompanied by family./In a wheelchair. Nichole Park 61 y.o.  female  stage IV adenocarcinoma the lung cancer is here currently on chemotherapy-immunotherapy is here for a follow up/review results of CT scan.  Denies any worsening shortness of breath or cough.  No fever no chills.  Appetite improving.  Denies any headaches.  Review of Systems  Constitutional:  Positive for malaise/fatigue. Negative for chills, diaphoresis and fever.  HENT:  Negative for nosebleeds and sore throat.   Eyes:  Negative for double vision.  Respiratory:  Positive for shortness of breath. Negative for cough,  hemoptysis, sputum production and wheezing.   Cardiovascular:  Negative for chest pain, palpitations, orthopnea and leg swelling.  Gastrointestinal:  Negative for abdominal pain, blood in stool, constipation, diarrhea, heartburn, melena, nausea and vomiting.  Genitourinary:  Negative for dysuria, frequency and urgency.  Musculoskeletal:  Negative for back pain and joint pain.  Skin: Negative.  Negative for itching and rash.  Neurological:  Negative for dizziness, tingling, focal weakness, weakness and headaches.  Endo/Heme/Allergies:  Does not bruise/bleed easily.  Psychiatric/Behavioral:  Negative for depression. The patient is not nervous/anxious and does not have insomnia.     MEDICAL HISTORY:  Past Medical History:  Diagnosis Date   Graves disease    Malignant pericardial effusion    Paroxysmal atrial fibrillation ([place])    Primary lung adenocarcinoma ([place])     SURGICAL HISTORY: Past Surgical History:  Procedure Laterality Date   IR CV LINE INJECTION  06/19/2021   IR IMAGING GUIDED PORT INSERTION  04/27/2021   PERICARDIOCENTESIS N/A 03/27/2021   Procedure: PERICARDIOCENTESIS;  Surgeon: Nelva Bush, MD;  Location: [facility];  Service: Cardiovascular;  Laterality: N/A;   TUBAL LIGATION      SOCIAL HISTORY: Social History   Socioeconomic History   Marital status: Widowed    Spouse name: Not on file   Number of children: Not on file   Years of education: Not on file   Highest education level: Not on file  Occupational History   Not on file  Tobacco Use   Smoking status: Former    Packs/day: 1.00    Years: 40.00    Pack years: 40.00    Types: Cigarettes   Smokeless tobacco:  Never  Vaping Use   Vaping Use: Never used  Substance and Sexual Activity   Alcohol use: No   Drug use: Yes    Types: Marijuana   Sexual activity: Not on file  Other Topics Concern   Not on file  Social History Narrative   Not on file   Social Determinants of Health    Financial Resource Strain: High Risk   Difficulty of Paying Living Expenses: Very hard  Food Insecurity: Not on file  Transportation Needs: Not on file  Physical Activity: Not on file  Stress: Stress Concern Present   Feeling of Stress : Very much  Social Connections: Not on file  Intimate Partner Violence: Not on file    FAMILY HISTORY: Family History  Problem Relation Age of Onset   Peripheral Artery Disease Mother     ALLERGIES:  is allergic to diazepam, metoprolol, and tapazole [methimazole].  MEDICATIONS:  Current Outpatient Medications  Medication Sig Dispense Refill   bismuth subsalicylate (PEPTO BISMOL) 262 MG/15ML suspension Take 30 mLs by mouth every 6 (six) hours as needed.     diltiazem (DILACOR XR) 240 MG 24 hr capsule Take 1 capsule (240 mg total) by mouth once daily. 90 capsule 3   folic acid (FOLVITE) 1 MG tablet Take 1 tablet (1 mg total) by mouth once daily. 90 tablet 1   lidocaine-prilocaine (EMLA) cream Apply 1 application topically daily as needed. 30 g 0   Multiple Vitamin (MULTI-VITAMINS) TABS Take 1 tablet by mouth daily.     albuterol (VENTOLIN HFA) 108 (90 Base) MCG/ACT inhaler Inhale 2 puffs into the lungs every 6 (six) hours as needed for wheezing or shortness of breath. (Patient not taking: Reported on 07/09/2021) 8.5 g 0   nystatin (MYCOSTATIN) 100000 UNIT/ML suspension Swish and spit 5 mLs (500,000 Units total) by mouth once every 8 (eight) hours as needed. (Patient not taking: Reported on 07/09/2021) 150 mL 0   ondansetron (ZOFRAN) 4 MG tablet Take 2 tablets (11m total) by mouth once every 8 hours as needed for nausea and vomiting. (Patient not taking: Reported on 07/09/2021) 80 tablet 1   prochlorperazine (COMPAZINE) 10 MG tablet Take 1 tablet (10 mg total) by mouth once every 6 (six) hours as needed for nausea or vomiting. (Patient not taking: Reported on 07/09/2021) 40 tablet 1   No current facility-administered medications for this visit.       .Marland Kitchen PHYSICAL EXAMINATION: ECOG PERFORMANCE STATUS: 1 - Symptomatic but completely ambulatory  Vitals:   07/09/21 1028  BP: 121/79  Pulse: 82  Resp: 20  Temp: (!) 97.4 F (36.3 C)   Filed Weights   07/09/21 1028  Weight: 136 lb 11.2 oz (62 kg)    Physical Exam Vitals and nursing note reviewed.  HENT:     Head: Normocephalic and atraumatic.     Mouth/Throat:     Pharynx: Oropharynx is clear.  Eyes:     Extraocular Movements: Extraocular movements intact.     Pupils: Pupils are equal, round, and reactive to light.  Cardiovascular:     Rate and Rhythm: Normal rate and regular rhythm.  Pulmonary:     Comments: Decreased breath sounds bilaterally.  Abdominal:     Palpations: Abdomen is soft.  Musculoskeletal:        General: Normal range of motion.     Cervical back: Normal range of motion.  Skin:    General: Skin is warm.  Neurological:     General: No focal deficit  present.     Mental Status: She is alert and oriented to person, place, and time.  Psychiatric:        Behavior: Behavior normal.        Judgment: Judgment normal.     LABORATORY DATA:  I have reviewed the data as listed Lab Results  Component Value Date   WBC 3.9 (L) 07/09/2021   HGB 11.2 (L) 07/09/2021   HCT 32.3 (L) 07/09/2021   MCV 91.0 07/09/2021   PLT 130 (L) 07/09/2021   Recent Labs    03/27/21 1635 03/28/21 0131 06/07/21 0820 06/18/21 0945 06/28/21 0804 07/09/21 1013  NA  --    < > 139 139 139 137  K  --    < > 3.8 3.7 3.7 3.7  CL  --    < > 106 105 104 102  CO2  --    < > 25 27 28 27   GLUCOSE  --    < > 194* 123* 144* 126*  BUN  --    < > 13 9 11 9   CREATININE  --    < > 0.63 0.52 0.55 0.57  CALCIUM  --    < > 9.5 9.2 9.4 9.2  GFRNONAA  --    < > >60 >60 >60 >60  PROT 7.7   < > 7.5  --  7.5 7.2  ALBUMIN 3.2*   < > 3.5  --  3.7 3.7  AST 31   < > 55*  --  39 34  ALT 21   < > 41  --  31 32  ALKPHOS 74   < > 89  --  81 72  BILITOT 1.2   < > 0.4  --  0.3 0.3  BILIDIR 0.4*  --    --   --   --   --   IBILI 0.8  --   --   --   --   --    < > = values in this interval not displayed.    RADIOGRAPHIC STUDIES: I have personally reviewed the radiological images as listed and agreed with the findings in the report. CT CHEST ABDOMEN PELVIS W CONTRAST  Result Date: 07/03/2021 CLINICAL DATA:  Restaging non-small cell lung cancer. EXAM: CT CHEST, ABDOMEN, AND PELVIS WITH CONTRAST TECHNIQUE: Multidetector CT imaging of the chest, abdomen and pelvis was performed following the standard protocol during bolus administration of intravenous contrast. CONTRAST:  173m OMNIPAQUE IOHEXOL 300 MG/ML  SOLN COMPARISON:  PET-CT 12/25/2020. FINDINGS: CT CHEST FINDINGS Cardiovascular: Heart size appears within normal limits. Aortic atherosclerosis. No pericardial effusion. Mediastinum/Nodes: Thyroid gland appears normal. Insert trachea insert esophagus -index left supraclavicular lymph node measures 0.6 cm, image 4/2. Previously this measured 0.9 cm. -Index right paratracheal lymph node measures 0.6 cm, image 15/2. Formally 1.2 cm. -Index low right paratracheal lymph node measures 0.6 cm, image 18/2. Formally 0.9 cm. Lungs/Pleura: Moderate changes of centrilobular and paraseptal emphysema.Right upper lobe lung mass measures 3.3 x 2.4 by 2.4 cm (volume = 10 cm^3), image 15/2. Formally 4.4 x 4.0 by 3.5 cm (volume = 32 cm^3). The previously noted small satellite nodule within the right upper lobe has resolved in the interval. Interval improvement in previously noted diffuse irregular interlobular septal thickening throughout the right lung. Residual interlobular septal thickening is noted within the right lower lobe but is improved compared with previous exam. No new sites of disease identified within the chest. Musculoskeletal: No chest wall mass or suspicious  bone lesions identified. CT ABDOMEN PELVIS FINDINGS Hepatobiliary: No focal liver abnormality is seen. No gallstones, gallbladder wall thickening, or  biliary dilatation. Pancreas: Unremarkable. No pancreatic ductal dilatation or surrounding inflammatory changes. Spleen: Normal in size without focal abnormality. Adrenals/Urinary Tract: Normal adrenal glands. Left kidney cyst measures 9 mm, image 67/2. No hydronephrosis identified bilaterally. Bladder appears normal. Stomach/Bowel: Stomach is within normal limits. Appendix appears normal. No evidence of bowel wall thickening, distention, or inflammatory changes. Extensive sigmoid diverticulosis is identified with diffuse wall thickening without surrounding inflammatory fat stranding. Findings are compatible with chronic diverticular disease. Vascular/Lymphatic: Aortic atherosclerosis without aneurysm. No abdominopelvic adenopathy. Reproductive: Uterus and bilateral adnexa are unremarkable. Other: No abdominal wall hernia or abnormality. No abdominopelvic ascites. Musculoskeletal: No acute or significant osseous findings. IMPRESSION: 1. Interval response to therapy. The previously noted right upper lobe lung mass has decreased in size in the interval. This currently has an approximate volume of 10 cc versus 32 cc previously. The previously noted satellite nodule within the right upper lobe has resolved in the interval. 2. Interval improvement in previously noted diffuse irregular interlobular septal thickening throughout the right lung. Residual interlobular septal thickening is notable within the right lower lobe but is improved compared with previous exam. Findings are likely related to lymphangitic spread of disease. 3. Interval decrease in size of index left supraclavicular and mediastinal lymph nodes. 4. No evidence for metastatic disease to the abdomen or pelvis. 5. Chronic sigmoid diverticular disease. 6. Aortic Atherosclerosis (ICD10-I70.0) and Emphysema (ICD10-J43.9). Electronically Signed   By: Kerby Moors M.D.   On: 07/03/2021 13:13   IR CV Line Injection  Result Date: 06/19/2021 CLINICAL DATA:   History of lung carcinoma and status post Port-A-Cath placement on 04/27/2021. There has been difficulty aspirating blood from the port after access. EXAM: CONTRAST INJECTION OF PORT A CATH UNDER FLUOROSCOPY CONTRAST:  10 mL Omnipaque 350 FLUOROSCOPY TIME:  12 seconds.  0.8 mGy PROCEDURE: Contrast was administered via the indwelling port after it was accessed. Fluoroscopic spot images were obtained of the catheter during injection. FINDINGS: The port placed via the right internal jugular vein is well positioned under fluoroscopy with the catheter tip located at the SVC/RA junction. With contrast injection, the port reservoir and attached catheter are patent and intact without evidence of contrast extravasation. There is prominent fibrin sheath formation along the distal several cm of the catheter within the SVC severely limiting outflow of contrast from the distal tip of the catheter. Contrast preferentially exits a perforation of the fibrin sheath along its medial aspect in the SVC several cm proximal to the tip of the port catheter. Injected contrast material freely streams and flows in the SVC and right atrium. IMPRESSION: Well positioned and intact Port-A-Cath with prominent fibrin sheath surrounding the distal several cm of catheter within the SVC. Contrast preferentially exits a perforation of the fibrin sheath along its medial aspect several cm proximal to the tip of the catheter. TPA dwells are recommended as a first line in determining whether ability to aspirate blood can be reestablished. Injected contrast does flow freely into the SVC and right atrium. Electronically Signed   By: Aletta Edouard M.D.   On: 06/19/2021 14:59   ECHOCARDIOGRAM LIMITED  Result Date: 06/22/2021    ECHOCARDIOGRAM LIMITED REPORT   Patient Name:   Nichole Park Date of Exam: 06/21/2021 Medical Rec #:  [id number]   Height:       61.0 in Accession #:    [id number]  Weight:  132.7 lb Date of Birth:  [birth year]  BSA:           1.587 m Patient Age:    14 years    BP:           125/78 mmHg Patient Gender: F           HR:           81 bpm. Exam Location:  [facility] Procedure: 2D Echo, Limited Echo and Limited Color Doppler Indications:    I31.3 Pericardial effusion (noninflammatory)  History:        Patient has prior history of Echocardiogram examinations, most                 recent 04/02/2021. COPD, Arrythmias:Atrial Fibrillation and                 Tachycardia, Signs/Symptoms:Shortness of Breath; Risk                 Factors:Hypertension and Former Smoker.  Sonographer:    Caesar Chestnut RDCS, RVT Referring Phys: [place]  1. Left ventricular ejection fraction, by estimation, is 60 to 65%. The left ventricle has normal function. The left ventricle has no regional wall motion abnormalities.  2. Right ventricular systolic function is normal. The right ventricular size is normal. There is normal pulmonary artery systolic pressure. The estimated right ventricular systolic pressure is 67.2 mmHg.  3. Trivial pericardial effusion is present. Previously noted echogenic material in pericardial space has essentially resolved. Comparison(s): LVEF >55%. FINDINGS  Left Ventricle: Left ventricular ejection fraction, by estimation, is 60 to 65%. The left ventricle has normal function. The left ventricle has no regional wall motion abnormalities. The left ventricular internal cavity size was normal in size. There is  no left ventricular hypertrophy. Right Ventricle: The right ventricular size is normal. No increase in right ventricular wall thickness. Right ventricular systolic function is normal. There is normal pulmonary artery systolic pressure. The tricuspid regurgitant velocity is 2.55 m/s, and  with an assumed right atrial pressure of 5 mmHg, the estimated right ventricular systolic pressure is 09.4 mmHg. Left Atrium: Left atrial size was normal in size. Right Atrium: Right atrial size was normal in size. Pericardium:  Trivial pericardial effusion is present. Previously noted echogenic material in pericardial space has essentially resolved. Mitral Valve: The mitral valve is normal in structure. No evidence of mitral valve stenosis. Tricuspid Valve: The tricuspid valve is normal in structure. Tricuspid valve regurgitation is trivial. No evidence of tricuspid stenosis. Aortic Valve: The aortic valve was not well visualized. Aortic valve regurgitation is not visualized. No aortic stenosis is present. Pulmonic Valve: The pulmonic valve was normal in structure. Pulmonic valve regurgitation is not visualized. No evidence of pulmonic stenosis. Aorta: The aortic root is normal in size and structure. Venous: The inferior vena cava is normal in size with greater than 50% respiratory variability, suggesting right atrial pressure of 3 mmHg. IAS/Shunts: No atrial level shunt detected by color flow Doppler. LEFT VENTRICLE PLAX 2D LVIDd:         4.50 cm LVIDs:         2.80 cm LV PW:         0.80 cm LV IVS:        0.70 cm  LV Volumes (MOD) LV vol d, MOD A2C: 39.4 ml LV vol d, MOD A4C: 70.1 ml LV vol s, MOD A2C: 11.7 ml LV vol s, MOD A4C: 20.6 ml LV SV MOD A2C:  27.7 ml LV SV MOD A4C:     70.1 ml LV SV MOD BP:      38.0 ml TRICUSPID VALVE TR Peak grad:   26.0 mmHg TR Vmax:        255.00 cm/s Ida Rogue MD Electronically signed by Ida Rogue MD Signature Date/Time: 06/22/2021/6:36:10 PM    Final     ASSESSMENT & PLAN:   Cancer of upper lobe of right lung ([place]) #Stage IV-lung cancer-adenocarcinoma. AUG 2022- MRI brain-subcentimeter brain lesions [asymptomatic-see below];  S/p carbo Alimta Keytruda cycle #4. OCT, 24th 2022-partial response noted with improvement of the right upper lobe lesion; and also improvement of the lymphangitic carcinomatosis; along with decrease in size of index left supraclavicular and mediastinal lymph nodes. 4. No evidence for metastatic disease to the abdomen or pelvis.  # PD-L1 -0; consider combination  Alimta Keytruda maintenance; as planned in 10 days.  #Subcentimeter brain lesions-clinically asymptomatic question cognitive defects as per daughter. Declined WBRT for now; will repeat MRI brain- 11/21 [pt pref].   # Malignant pericardial tamponade-s/p pericardiocentesis;-secondary to lung adenocarcinoma-repeat AUG 2022-2D echo negative for any pericardial effusion- STABLE   # chronic respiratory failure-multifactorial-malignancy/lymphangitic spread/COPD stable-significant provement s/p/bronchodilators/diuretics- STABLE.  #A. Fib-amiodarone/Cardizem as per cardiology-  Not on anti-coagulation [?  Hemorrhagic pericardial effusion]-STABLE;  ? anticoagulation at next visit.   # PBG- 144- off pre-meds- steroids; [prediabetes]-- cut carbs; monitor closely.   # COVID VACCINATION: Un-vaccinated; DECLINES/declined- [place]LD.   # Port malfunction: STABLE.   # DISPOSITION: # NO IVFs; de-acccess # follow up as planned- on nov 10th- MD; labs- cbc/cmp;TSH; Alimta-keytruda--Dr.B  # I reviewed the blood work- with the patient in detail; also reviewed the imaging independently [as summarized above]; and with the patient in detail.       All questions were answered. The patient knows to call the clinic with any problems, questions or concerns.    Cammie Sickle, MD 07/09/2021 3:17 PM

## 2021-07-09 NOTE — Assessment & Plan Note (Addendum)
#  Stage IV-lung cancer-adenocarcinoma. AUG 2022- MRI brain-subcentimeter brain lesions [asymptomatic-see below];  S/p carbo Alimta Keytruda cycle #4. OCT, 24th 2022-partial response noted with improvement of the right upper lobe lesion; and also improvement of the lymphangitic carcinomatosis; along with decrease in size of index left supraclavicular and mediastinal lymph nodes. 4. No evidence for metastatic disease to the abdomen or pelvis.  # PD-L1 -0; consider combination Alimta Keytruda maintenance; as planned in 10 days.  #Subcentimeter brain lesions-clinically asymptomatic question cognitive defects as per daughter. Declined WBRT for now; will repeat MRI brain- 11/21 [pt pref].   # Malignant pericardial tamponade-s/p pericardiocentesis;-secondary to lung adenocarcinoma-repeat AUG 2022-2D echo negative for any pericardial effusion- STABLE   # chronic respiratory failure-multifactorial-malignancy/lymphangitic spread/COPD stable-significant provement s/p/bronchodilators/diuretics- STABLE.  #A. Fib-amiodarone/Cardizem as per cardiology-  Not on anti-coagulation [?  Hemorrhagic pericardial effusion]-STABLE;  ? anticoagulation at next visit.   # PBG- 144- off pre-meds- steroids; [prediabetes]-- cut carbs; monitor closely.   # COVID VACCINATION: Un-vaccinated; DECLINES/declined- [place]LD.   # Port malfunction: STABLE.   # DISPOSITION: # NO IVFs; de-acccess # follow up as planned- on nov 10th- MD; labs- cbc/cmp;TSH; Alimta-keytruda--Dr.B  # I reviewed the blood work- with the patient in detail; also reviewed the imaging independently [as summarized above]; and with the patient in detail.

## 2021-07-11 ENCOUNTER — Other Ambulatory Visit: Payer: Self-pay

## 2021-07-11 ENCOUNTER — Encounter: Payer: Self-pay | Admitting: Internal Medicine

## 2021-07-17 ENCOUNTER — Telehealth: Payer: Self-pay | Admitting: Internal Medicine

## 2021-07-17 NOTE — Telephone Encounter (Signed)
Nichole Park- pt needs to change her lab/md/keytruda apts. Pls return call and r/s apts - pt preference.

## 2021-07-17 NOTE — Telephone Encounter (Signed)
Pt called to cancel her appt for 11-10. Lease call back at [phone]

## 2021-07-19 ENCOUNTER — Inpatient Hospital Stay: Payer: Medicaid Other

## 2021-07-19 ENCOUNTER — Inpatient Hospital Stay: Payer: Medicaid Other | Admitting: Internal Medicine

## 2021-07-25 ENCOUNTER — Inpatient Hospital Stay: Payer: Medicaid Other | Attending: Internal Medicine

## 2021-07-25 ENCOUNTER — Inpatient Hospital Stay (HOSPITAL_BASED_OUTPATIENT_CLINIC_OR_DEPARTMENT_OTHER): Payer: Medicaid Other | Admitting: Internal Medicine

## 2021-07-25 ENCOUNTER — Inpatient Hospital Stay: Payer: Medicaid Other

## 2021-07-25 ENCOUNTER — Encounter: Payer: Self-pay | Admitting: Internal Medicine

## 2021-07-25 ENCOUNTER — Other Ambulatory Visit: Payer: Self-pay

## 2021-07-25 VITALS — BP 130/80 | HR 80

## 2021-07-25 DIAGNOSIS — Z79899 Other long term (current) drug therapy: Secondary | ICD-10-CM | POA: Diagnosis not present

## 2021-07-25 DIAGNOSIS — C3411 Malignant neoplasm of upper lobe, right bronchus or lung: Secondary | ICD-10-CM

## 2021-07-25 DIAGNOSIS — I3131 Malignant pericardial effusion in diseases classified elsewhere: Secondary | ICD-10-CM

## 2021-07-25 DIAGNOSIS — J9610 Chronic respiratory failure, unspecified whether with hypoxia or hypercapnia: Secondary | ICD-10-CM | POA: Diagnosis not present

## 2021-07-25 DIAGNOSIS — I480 Paroxysmal atrial fibrillation: Secondary | ICD-10-CM | POA: Insufficient documentation

## 2021-07-25 DIAGNOSIS — Z5111 Encounter for antineoplastic chemotherapy: Secondary | ICD-10-CM | POA: Insufficient documentation

## 2021-07-25 DIAGNOSIS — Z5112 Encounter for antineoplastic immunotherapy: Secondary | ICD-10-CM | POA: Insufficient documentation

## 2021-07-25 DIAGNOSIS — G939 Disorder of brain, unspecified: Secondary | ICD-10-CM | POA: Insufficient documentation

## 2021-07-25 LAB — CBC WITH DIFFERENTIAL/PLATELET
Abs Immature Granulocytes: 0.09 10*3/uL — ABNORMAL HIGH (ref 0.00–0.07)
Basophils Absolute: 0.1 10*3/uL (ref 0.0–0.1)
Basophils Relative: 1 %
Eosinophils Absolute: 0.1 10*3/uL (ref 0.0–0.5)
Eosinophils Relative: 1 %
HCT: 38.7 % (ref 36.0–46.0)
Hemoglobin: 13.1 g/dL (ref 12.0–15.0)
Immature Granulocytes: 1 %
Lymphocytes Relative: 25 %
Lymphs Abs: 2.5 10*3/uL (ref 0.7–4.0)
MCH: 32 pg (ref 26.0–34.0)
MCHC: 33.9 g/dL (ref 30.0–36.0)
MCV: 94.6 fL (ref 80.0–100.0)
Monocytes Absolute: 1.1 10*3/uL — ABNORMAL HIGH (ref 0.1–1.0)
Monocytes Relative: 11 %
Neutro Abs: 6.3 10*3/uL (ref 1.7–7.7)
Neutrophils Relative %: 61 %
Platelets: 342 10*3/uL (ref 150–400)
RBC: 4.09 MIL/uL (ref 3.87–5.11)
RDW: 17.5 % — ABNORMAL HIGH (ref 11.5–15.5)
WBC: 10.2 10*3/uL (ref 4.0–10.5)
nRBC: 0 % (ref 0.0–0.2)

## 2021-07-25 LAB — COMPREHENSIVE METABOLIC PANEL
ALT: 42 U/L (ref 0–44)
AST: 54 U/L — ABNORMAL HIGH (ref 15–41)
Albumin: 4 g/dL (ref 3.5–5.0)
Alkaline Phosphatase: 83 U/L (ref 38–126)
Anion gap: 11 (ref 5–15)
BUN: 12 mg/dL (ref 6–20)
CO2: 24 mmol/L (ref 22–32)
Calcium: 9.5 mg/dL (ref 8.9–10.3)
Chloride: 102 mmol/L (ref 98–111)
Creatinine, Ser: 0.62 mg/dL (ref 0.44–1.00)
GFR, Estimated: >60 mL/min (ref >60)
Glucose, Bld: 164 mg/dL — ABNORMAL HIGH (ref 70–99)
Potassium: 3.7 mmol/L (ref 3.5–5.1)
Sodium: 137 mmol/L (ref 135–145)
Total Bilirubin: 0.3 mg/dL (ref 0.3–1.2)
Total Protein: 8.4 g/dL — ABNORMAL HIGH (ref 6.5–8.1)

## 2021-07-25 LAB — TSH: TSH: 2.244 u[IU]/mL (ref 0.350–4.500)

## 2021-07-25 MED ORDER — PROCHLORPERAZINE MALEATE 10 MG PO TABS
10.0000 mg | ORAL_TABLET | Freq: Once | ORAL | Status: AC
  Administered 2021-07-25: 10 mg via ORAL
  Filled 2021-07-25: qty 1

## 2021-07-25 MED ORDER — SODIUM CHLORIDE 0.9 % IV SOLN
200.0000 mg | Freq: Once | INTRAVENOUS | Status: AC
  Administered 2021-07-25: 200 mg via INTRAVENOUS
  Filled 2021-07-25: qty 8

## 2021-07-25 MED ORDER — SODIUM CHLORIDE 0.9 % IV SOLN
Freq: Once | INTRAVENOUS | Status: AC
  Filled 2021-07-25: qty 250

## 2021-07-25 MED ORDER — HEPARIN SOD (PORK) LOCK FLUSH 100 UNIT/ML IV SOLN
500.0000 [IU] | Freq: Once | INTRAVENOUS | Status: AC | PRN
  Administered 2021-07-25: 500 [IU]
  Filled 2021-07-25: qty 5

## 2021-07-25 MED ORDER — SODIUM CHLORIDE 0.9 % IV SOLN
500.0000 mg/m2 | Freq: Once | INTRAVENOUS | Status: AC
  Administered 2021-07-25: 800 mg via INTRAVENOUS
  Filled 2021-07-25: qty 20

## 2021-07-25 NOTE — Progress Notes (Signed)
[facility] CONSULT NOTE  Patient Care Team: Practice, [facility] (Inactive) as PCP - General End, Harrell Gave, MD as PCP - Cardiology (Cardiology) Telford Nab, RN as Oncology Nurse Navigator  CHIEF COMPLAINTS/PURPOSE OF CONSULTATION: Lung cancer  #  Oncology History Overview Note  # AUG 2022-stage IV adenocarcinoma of the lung [s/p pericardial effusion/tamponade]. MRI Brain AUG 16th, 2022- IMPRESSION: Multiple peripherally enhancing, centrally T1 hypointense lesions, with surrounding T2 hyperintense signal in the bilateral cerebral hemispheres, concerning for metastatic disease.    # AUG 17th 2022- CARBO-ALIMTA-Keytruda #1  #Acute respiratory failure BiPAP/ICU.  S/p pericardiocentesis.  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  [place] 60 y.o.  female  stage IV adenocarcinoma the lung cancer is here currently on chemotherapy-immunotherapy is here for a follow up.  Denies any worsening shortness of breath or cough.  No fever no chills.  Appetite improving.  She is more ambulatory at this time.  No headaches.  Review of Systems  Constitutional:  Positive for malaise/fatigue. Negative for chills, diaphoresis and fever.  HENT:  Negative for nosebleeds and sore throat.   Eyes:  Negative for double vision.  Respiratory:  Positive for shortness of  breath. Negative for cough, hemoptysis, sputum production and wheezing.   Cardiovascular:  Negative for chest pain, palpitations, orthopnea and leg swelling.  Gastrointestinal:  Negative for abdominal pain, blood in stool, constipation, diarrhea, heartburn, melena, nausea and vomiting.  Genitourinary:  Negative for dysuria, frequency and urgency.  Musculoskeletal:  Negative for back pain and joint pain.  Skin: Negative.  Negative for itching and rash.  Neurological:  Negative for dizziness, tingling, focal weakness, weakness and headaches.  Endo/Heme/Allergies:  Does not bruise/bleed easily.  Psychiatric/Behavioral:  Negative for depression. The patient is not nervous/anxious and does not have insomnia.     MEDICAL HISTORY:  Past Medical History:  Diagnosis Date   Graves disease    Malignant pericardial effusion    Paroxysmal atrial fibrillation ([place])    Primary lung adenocarcinoma ([place])     SURGICAL HISTORY: Past Surgical History:  Procedure Laterality Date   IR CV LINE INJECTION  06/19/2021   IR IMAGING GUIDED PORT INSERTION  04/27/2021   PERICARDIOCENTESIS N/A 03/27/2021   Procedure: PERICARDIOCENTESIS;  Surgeon: Nelva Bush, MD;  Location: [facility];  Service: Cardiovascular;  Laterality: N/A;   TUBAL LIGATION      SOCIAL HISTORY: Social History   Socioeconomic History   Marital status: Widowed    Spouse name: Not on file   Number of children: Not on file   Years of education: Not on file   Highest education level: Not on file  Occupational History   Not on file  Tobacco Use   Smoking status: Former    Packs/day: 1.00    Years: 40.00    Pack years: 40.00  Types: Cigarettes   Smokeless tobacco: Never  Vaping Use   Vaping Use: Never used  Substance and Sexual Activity   Alcohol use: No   Drug use: Yes    Types: Marijuana   Sexual activity: Not on file  Other Topics Concern   Not on file  Social History Narrative   Not on file   Social  Determinants of Health   Financial Resource Strain: High Risk   Difficulty of Paying Living Expenses: Very hard  Food Insecurity: Not on file  Transportation Needs: Not on file  Physical Activity: Not on file  Stress: Stress Concern Present   Feeling of Stress : Very much  Social Connections: Not on file  Intimate Partner Violence: Not on file    FAMILY HISTORY: Family History  Problem Relation Age of Onset   Peripheral Artery Disease Mother     ALLERGIES:  is allergic to diazepam, metoprolol, and tapazole [methimazole].  MEDICATIONS:  Current Outpatient Medications  Medication Sig Dispense Refill   bismuth subsalicylate (PEPTO BISMOL) 262 MG/15ML suspension Take 30 mLs by mouth every 6 (six) hours as needed.     diltiazem (DILACOR XR) 240 MG 24 hr capsule Take 1 capsule (240 mg total) by mouth once daily. 90 capsule 3   folic acid (FOLVITE) 1 MG tablet Take 1 tablet (1 mg total) by mouth once daily. 90 tablet 1   lidocaine-prilocaine (EMLA) cream Apply 1 application topically daily as needed. 30 g 0   Multiple Vitamin (MULTI-VITAMINS) TABS Take 1 tablet by mouth daily.     albuterol (VENTOLIN HFA) 108 (90 Base) MCG/ACT inhaler Inhale 2 puffs into the lungs every 6 (six) hours as needed for wheezing or shortness of breath. (Patient not taking: No sig reported) 8.5 g 0   nystatin (MYCOSTATIN) 100000 UNIT/ML suspension Swish and spit 5 mLs (500,000 Units total) by mouth once every 8 (eight) hours as needed. (Patient not taking: No sig reported) 150 mL 0   ondansetron (ZOFRAN) 4 MG tablet Take 2 tablets (71m total) by mouth once every 8 hours as needed for nausea and vomiting. (Patient not taking: No sig reported) 80 tablet 1   prochlorperazine (COMPAZINE) 10 MG tablet Take 1 tablet (10 mg total) by mouth once every 6 (six) hours as needed for nausea or vomiting. (Patient not taking: No sig reported) 40 tablet 1   No current facility-administered medications for this visit.    Facility-Administered Medications Ordered in Other Visits  Medication Dose Route Frequency Provider Last Rate Last Admin   pembrolizumab (KEYTRUDA) 200 mg in sodium chloride 0.9 % 50 mL chemo infusion  200 mg Intravenous Once BCharlaine DaltonR, MD       PEMEtrexed (ALIMTA) 800 mg in sodium chloride 0.9 % 100 mL chemo infusion  500 mg/m2 (Treatment Plan Recorded) Intravenous Once BCammie Sickle MD          .  PHYSICAL EXAMINATION: ECOG PERFORMANCE STATUS: 1 - Symptomatic but completely ambulatory  Vitals:   07/25/21 0836  BP: 138/90  Pulse: 98  Resp: 18  Temp: (!) 97.4 F (36.3 C)   Filed Weights   07/25/21 0836  Weight: 135 lb 8 oz (61.5 kg)    Physical Exam Vitals and nursing note reviewed.  HENT:     Head: Normocephalic and atraumatic.     Mouth/Throat:     Pharynx: Oropharynx is clear.  Eyes:     Extraocular Movements: Extraocular movements intact.     Pupils: Pupils are equal,  round, and reactive to light.  Cardiovascular:     Rate and Rhythm: Normal rate and regular rhythm.  Pulmonary:     Comments: Decreased breath sounds bilaterally.  Abdominal:     Palpations: Abdomen is soft.  Musculoskeletal:        General: Normal range of motion.     Cervical back: Normal range of motion.  Skin:    General: Skin is warm.  Neurological:     General: No focal deficit present.     Mental Status: She is alert and oriented to person, place, and time.  Psychiatric:        Behavior: Behavior normal.        Judgment: Judgment normal.     LABORATORY DATA:  I have reviewed the data as listed Lab Results  Component Value Date   WBC 10.2 07/25/2021   HGB 13.1 07/25/2021   HCT 38.7 07/25/2021   MCV 94.6 07/25/2021   PLT 342 07/25/2021   Recent Labs    03/27/21 1635 03/28/21 0131 06/28/21 0804 07/09/21 1013 07/25/21 0808  NA  --    < > 139 137 137  K  --    < > 3.7 3.7 3.7  CL  --    < > 104 102 102  CO2  --    < > 28 27 24   GLUCOSE  --    < >  144* 126* 164*  BUN  --    < > 11 9 12   CREATININE  --    < > 0.55 0.57 0.62  CALCIUM  --    < > 9.4 9.2 9.5  GFRNONAA  --    < > >60 >60 >60  PROT 7.7   < > 7.5 7.2 8.4*  ALBUMIN 3.2*   < > 3.7 3.7 4.0  AST 31   < > 39 34 54*  ALT 21   < > 31 32 42  ALKPHOS 74   < > 81 72 83  BILITOT 1.2   < > 0.3 0.3 0.3  BILIDIR 0.4*  --   --   --   --   IBILI 0.8  --   --   --   --    < > = values in this interval not displayed.    RADIOGRAPHIC STUDIES: I have personally reviewed the radiological images as listed and agreed with the findings in the report. CT CHEST ABDOMEN PELVIS W CONTRAST  Result Date: 07/03/2021 CLINICAL DATA:  Restaging non-small cell lung cancer. EXAM: CT CHEST, ABDOMEN, AND PELVIS WITH CONTRAST TECHNIQUE: Multidetector CT imaging of the chest, abdomen and pelvis was performed following the standard protocol during bolus administration of intravenous contrast. CONTRAST:  156m OMNIPAQUE IOHEXOL 300 MG/ML  SOLN COMPARISON:  PET-CT 12/25/2020. FINDINGS: CT CHEST FINDINGS Cardiovascular: Heart size appears within normal limits. Aortic atherosclerosis. No pericardial effusion. Mediastinum/Nodes: Thyroid gland appears normal. Insert trachea insert esophagus -index left supraclavicular lymph node measures 0.6 cm, image 4/2. Previously this measured 0.9 cm. -Index right paratracheal lymph node measures 0.6 cm, image 15/2. Formally 1.2 cm. -Index low right paratracheal lymph node measures 0.6 cm, image 18/2. Formally 0.9 cm. Lungs/Pleura: Moderate changes of centrilobular and paraseptal emphysema.Right upper lobe lung mass measures 3.3 x 2.4 by 2.4 cm (volume = 10 cm^3), image 15/2. Formally 4.4 x 4.0 by 3.5 cm (volume = 32 cm^3). The previously noted small satellite nodule within the right upper lobe has resolved in the interval. Interval improvement in previously  noted diffuse irregular interlobular septal thickening throughout the right lung. Residual interlobular septal thickening is noted  within the right lower lobe but is improved compared with previous exam. No new sites of disease identified within the chest. Musculoskeletal: No chest wall mass or suspicious bone lesions identified. CT ABDOMEN PELVIS FINDINGS Hepatobiliary: No focal liver abnormality is seen. No gallstones, gallbladder wall thickening, or biliary dilatation. Pancreas: Unremarkable. No pancreatic ductal dilatation or surrounding inflammatory changes. Spleen: Normal in size without focal abnormality. Adrenals/Urinary Tract: Normal adrenal glands. Left kidney cyst measures 9 mm, image 67/2. No hydronephrosis identified bilaterally. Bladder appears normal. Stomach/Bowel: Stomach is within normal limits. Appendix appears normal. No evidence of bowel wall thickening, distention, or inflammatory changes. Extensive sigmoid diverticulosis is identified with diffuse wall thickening without surrounding inflammatory fat stranding. Findings are compatible with chronic diverticular disease. Vascular/Lymphatic: Aortic atherosclerosis without aneurysm. No abdominopelvic adenopathy. Reproductive: Uterus and bilateral adnexa are unremarkable. Other: No abdominal wall hernia or abnormality. No abdominopelvic ascites. Musculoskeletal: No acute or significant osseous findings. IMPRESSION: 1. Interval response to therapy. The previously noted right upper lobe lung mass has decreased in size in the interval. This currently has an approximate volume of 10 cc versus 32 cc previously. The previously noted satellite nodule within the right upper lobe has resolved in the interval. 2. Interval improvement in previously noted diffuse irregular interlobular septal thickening throughout the right lung. Residual interlobular septal thickening is notable within the right lower lobe but is improved compared with previous exam. Findings are likely related to lymphangitic spread of disease. 3. Interval decrease in size of index left supraclavicular and mediastinal  lymph nodes. 4. No evidence for metastatic disease to the abdomen or pelvis. 5. Chronic sigmoid diverticular disease. 6. Aortic Atherosclerosis (ICD10-I70.0) and Emphysema (ICD10-J43.9). Electronically Signed   By: Kerby Moors M.D.   On: 07/03/2021 13:13    ASSESSMENT & PLAN:   Cancer of upper lobe of right lung ([place]) #Stage IV-lung cancer-adenocarcinoma. AUG 2022- MRI brain-subcentimeter brain lesions [asymptomatic-see below];  S/p carbo Alimta Keytruda cycle #4. OCT, 24th 2022-partial response noted with improvement of the right upper lobe lesion; and also improvement of the lymphangitic carcinomatosis; along with decrease in size of index left supraclavicular and mediastinal lymph nodes. 4. No evidence for metastatic disease to the abdomen or pelvis. [PDl-1:0]  # proceed with Alimta Keytruda maintenance Labs today reviewed;  acceptable for treatment today.   # Subcentimeter brain lesions-clinically asymptomatic question cognitive defects as per daughter. Declined WBRT for now; will repeat MRI brain- 11/21 [pt pref- STABLE].   # Malignant pericardial tamponade-s/p pericardiocentesis;-secondary to lung adenocarcinoma-repeat AUG 2022-2D echo negative for any pericardial effusion-STABLE.   # chronic respiratory failure-multifactorial-malignancy/lymphangitic spread/COPD stable-significant provement s/p/bronchodilators/diuretics- STABLE  #A. Fib-amiodarone/Cardizem as per cardiology-  Not on anti-coagulation [?  Hemorrhagic pericardial effusion]-STABLE;  ? anticoagulation at next visit.   # COVID VACCINATION: Un-vaccinated; DECLINES/declined- EVUSHELD.   # Port malfunction: STABLE.   # DISPOSITION: # chemo today.  # follow up in 3 weeks;  MD; labs- cbc/cmp;; Alimta-keytruda--Dr.B     All questions were answered. The patient knows to call the clinic with any problems, questions or concerns.    Cammie Sickle, MD 07/25/2021 9:09 AM

## 2021-07-25 NOTE — Patient Instructions (Signed)
[facility]  Discharge Instructions: Thank you for choosing [facility] to provide your oncology and hematology care.  If you have a lab appointment with the Cancer Center, please go directly to the Cancer Center and check in at the registration area.  Wear comfortable clothing and clothing appropriate for easy access to any Portacath or PICC line.   We strive to give you quality time with your provider. You may need to reschedule your appointment if you arrive late (15 or more minutes).  Arriving late affects you and other patients whose appointments are after yours.  Also, if you miss three or more appointments without notifying the office, you may be dismissed from the clinic at the provider's discretion.      For prescription refill requests, have your pharmacy contact our office and allow 72 hours for refills to be completed.      To help prevent nausea and vomiting after your treatment, we encourage you to take your nausea medication as directed.  BELOW ARE SYMPTOMS THAT SHOULD BE REPORTED IMMEDIATELY: *FEVER GREATER THAN 100.4 F (38 C) OR HIGHER *CHILLS OR SWEATING *NAUSEA AND VOMITING THAT IS NOT CONTROLLED WITH YOUR NAUSEA MEDICATION *UNUSUAL SHORTNESS OF BREATH *UNUSUAL BRUISING OR BLEEDING *URINARY PROBLEMS (pain or burning when urinating, or frequent urination) *BOWEL PROBLEMS (unusual diarrhea, constipation, pain near the anus) TENDERNESS IN MOUTH AND THROAT WITH OR WITHOUT PRESENCE OF ULCERS (sore throat, sores in mouth, or a toothache) UNUSUAL RASH, SWELLING OR PAIN  UNUSUAL VAGINAL DISCHARGE OR ITCHING   Items with * indicate a potential emergency and should be followed up as soon as possible or go to the Emergency Department if any problems should occur.  Please show the CHEMOTHERAPY ALERT CARD or IMMUNOTHERAPY ALERT CARD at check-in to the Emergency Department and triage nurse.  Should you have questions after your  visit or need to cancel or reschedule your appointment, please contact [facility]  [phone] and follow the prompts.  Office hours are 8:00 a.m. to 4:30 p.m. Monday - Friday. Please note that voicemails left after 4:00 p.m. may not be returned until the following business day.  We are closed weekends and major holidays. You have access to a nurse at all times for urgent questions. Please call the main number to the clinic [phone] and follow the prompts.  For any non-urgent questions, you may also contact your provider using MyChart. We now offer e-Visits for anyone 18 and older to request care online for non-urgent symptoms. For details visit [email or web address].   Also download the MyChart app! Go to the app store, search "MyChart", open the app, select , and log in with your MyChart username and password.  Due to Covid, a mask is required upon entering the hospital/clinic. If you do not have a mask, one will be given to you upon arrival. For doctor visits, patients may have 1 support person aged 18 or older with them. For treatment visits, patients cannot have anyone with them due to current Covid guidelines and our immunocompromised population.  

## 2021-07-25 NOTE — Assessment & Plan Note (Addendum)
#  Stage IV-lung cancer-adenocarcinoma. AUG 2022- MRI brain-subcentimeter brain lesions [asymptomatic-see below];  S/p carbo Alimta Keytruda cycle #4. OCT, 24th 2022-partial response noted with improvement of the right upper lobe lesion; and also improvement of the lymphangitic carcinomatosis; along with decrease in size of index left supraclavicular and mediastinal lymph nodes.  No evidence for metastatic disease to the abdomen or pelvis.   # proceed with Alimta Keytruda maintenance Labs today reviewed;  acceptable for treatment today.   # Subcentimeter brain lesions-clinically asymptomatic question cognitive defects as per daughter. Declined WBRT for now; will repeat MRI brain- 11/21 [pt pref- STABLE].   # Malignant pericardial tamponade-s/p pericardiocentesis;-secondary to lung adenocarcinoma-repeat AUG 2022-2D echo negative for any pericardial effusion-STABLE.   # chronic respiratory failure-multifactorial-malignancy/lymphangitic spread/COPD stable-significant provement s/p/bronchodilators/diuretics- STABLE  #A. Fib-amiodarone/Cardizem as per cardiology-  Not on anti-coagulation [?  Hemorrhagic pericardial effusion]-STABLE;  ? anticoagulation at next visit.   # COVID VACCINATION: Un-vaccinated; DECLINES/declined- [place]HE[place].   # Port malfunction: STABLE.   # DISPOSITION: # chemo today.  # follow up in 3 weeks;  MD; labs- cbc/cmp;; Alimta-keytruda; B12 injection-Dr.B

## 2021-07-25 NOTE — Progress Notes (Signed)
Patient denies new problems/concerns today.   °

## 2021-07-26 ENCOUNTER — Encounter: Payer: Self-pay | Admitting: Internal Medicine

## 2021-07-30 ENCOUNTER — Ambulatory Visit
Admission: RE | Admit: 2021-07-30 | Discharge: 2021-07-30 | Disposition: A | Discharge status: Home / Self Care | Payer: Medicaid Other | Source: Ambulatory Visit | Attending: Internal Medicine | Admitting: Internal Medicine

## 2021-07-30 ENCOUNTER — Other Ambulatory Visit: Payer: Self-pay

## 2021-07-30 DIAGNOSIS — C7931 Secondary malignant neoplasm of brain: Secondary | ICD-10-CM | POA: Diagnosis present

## 2021-07-30 DIAGNOSIS — C3411 Malignant neoplasm of upper lobe, right bronchus or lung: Secondary | ICD-10-CM | POA: Insufficient documentation

## 2021-07-30 MED ORDER — GADOBUTROL 1 MMOL/ML IV SOLN
6.0000 mL | Freq: Once | INTRAVENOUS | Status: AC | PRN
  Administered 2021-07-30: 6 mL via INTRAVENOUS

## 2021-08-01 ENCOUNTER — Telehealth: Payer: Self-pay | Admitting: Internal Medicine

## 2021-08-01 DIAGNOSIS — C3411 Malignant neoplasm of upper lobe, right bronchus or lung: Secondary | ICD-10-CM

## 2021-08-01 NOTE — Addendum Note (Signed)
Addended by: Vanice Sarah on: 08/01/2021 11:08 AM   Modules accepted: Orders

## 2021-08-01 NOTE — Telephone Encounter (Signed)
On 11/22-I spoke to patient regarding results of the MRI-mixed response.  Recommend evaluation with Dr. Donella Stade for consideration of radiation [SBRT to the increasing brain lesion].  C- Please make a referral to Dr. Donella Stade ASAP.  Follow-up with me as planned.

## 2021-08-01 NOTE — Telephone Encounter (Signed)
Referral to Dr. Baruch Gouty entered.

## 2021-08-07 ENCOUNTER — Other Ambulatory Visit: Payer: Self-pay

## 2021-08-07 ENCOUNTER — Ambulatory Visit
Admission: RE | Admit: 2021-08-07 | Discharge: 2021-08-07 | Disposition: A | Discharge status: Home / Self Care | Payer: Medicaid Other | Source: Ambulatory Visit | Attending: Radiation Oncology | Admitting: Radiation Oncology

## 2021-08-07 ENCOUNTER — Other Ambulatory Visit: Payer: Self-pay | Admitting: *Deleted

## 2021-08-07 ENCOUNTER — Encounter: Payer: Self-pay | Admitting: Radiation Oncology

## 2021-08-07 VITALS — BP 120/82 | HR 89 | Temp 96.1°F | Wt 138.1 lb

## 2021-08-07 DIAGNOSIS — Z87891 Personal history of nicotine dependence: Secondary | ICD-10-CM | POA: Diagnosis not present

## 2021-08-07 DIAGNOSIS — I3139 Other pericardial effusion (noninflammatory): Secondary | ICD-10-CM | POA: Insufficient documentation

## 2021-08-07 DIAGNOSIS — E0500 Thyrotoxicosis with diffuse goiter without thyrotoxic crisis or storm: Secondary | ICD-10-CM | POA: Diagnosis not present

## 2021-08-07 DIAGNOSIS — C7931 Secondary malignant neoplasm of brain: Secondary | ICD-10-CM

## 2021-08-07 DIAGNOSIS — F1290 Cannabis use, unspecified, uncomplicated: Secondary | ICD-10-CM | POA: Insufficient documentation

## 2021-08-07 DIAGNOSIS — Z79899 Other long term (current) drug therapy: Secondary | ICD-10-CM | POA: Insufficient documentation

## 2021-08-07 DIAGNOSIS — C3411 Malignant neoplasm of upper lobe, right bronchus or lung: Secondary | ICD-10-CM | POA: Diagnosis not present

## 2021-08-07 DIAGNOSIS — I480 Paroxysmal atrial fibrillation: Secondary | ICD-10-CM | POA: Diagnosis not present

## 2021-08-07 NOTE — Consult Note (Signed)
NEW PATIENT EVALUATION  Name: Nichole Park  MRN: [id number]  Date:   08/07/2021     DOB: Apr 17, 1961   This 60 y.o. female patient presents to the clinic for initial evaluation of progressive solitary brain metastasis and patient with known stage IV adenocarcinoma of the lung.  REFERRING PHYSICIAN: Cammie Sickle, *  CHIEF COMPLAINT:  Chief Complaint  Patient presents with   Cancer    DIAGNOSIS: There were no encounter diagnoses.   PREVIOUS INVESTIGATIONS:  CT scans PET CT scans and MRI scans of the brain reviewed Pathology report reviewed Clinical notes reviewed  HPI: Patient is a 60 year old female diagnosed back in August 2022 with stage IV adenocarcinoma of the lung.  She had pericardial effusion and tamponade eventually requiring a pericardiocentesis secondary to acute respiratory failure.  She initially presented with multiple peripheral enhancing lesions of the brain concerning for metastatic disease.  She been treated with [place] Alimta and [place].  She has had significant decrease in the right upper lobe lesion.  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  Most recently she she has had repeat MRI scan of her brain showing mild increase size of a left parietal mass with increased and mild edema.  2 other brain metastasis showed decreased size suggestive of treatment response.  I have asked to evaluate her for possible SRS treatment.  She is seen today doing fairly well specifically [place] cough hemoptysis or chest tightness.  She is really having no headaches or any focal neurologic deficits at this time.  PLANNED TREATMENT REGIMEN: SRS  PAST MEDICAL HISTORY:  has a past medical history of Graves disease, Malignant pericardial effusion, Paroxysmal atrial fibrillation ([place]), and Primary lung adenocarcinoma ([place]).    PAST SURGICAL HISTORY:  Past Surgical History:  Procedure Laterality Date   IR CV LINE INJECTION  06/19/2021   IR IMAGING  GUIDED PORT INSERTION  04/27/2021   PERICARDIOCENTESIS N/A 03/27/2021   Procedure: PERICARDIOCENTESIS;  Surgeon: Nelva Bush, MD;  Location: [facility];  Service: Cardiovascular;  Laterality: N/A;   TUBAL LIGATION      FAMILY HISTORY: family history includes Peripheral Artery Disease in her mother.  SOCIAL HISTORY:  reports that she has quit smoking. Her smoking use included cigarettes. She has a 40.00 pack-year smoking history. She has never used smokeless tobacco. She reports current drug use. Drug: Marijuana. She reports that she does not drink alcohol.  ALLERGIES: Diazepam, Metoprolol, and Tapazole [methimazole]  MEDICATIONS:  Current Outpatient Medications  Medication Sig Dispense Refill   bismuth subsalicylate (PEPTO BISMOL) 262 MG/15ML suspension Take 30 mLs by mouth every 6 (six) hours as needed.     diltiazem (DILACOR XR) 240 MG 24 hr capsule Take 1 capsule (240 mg total) by mouth once daily. 90 capsule 3   folic acid (FOLVITE) 1 MG tablet Take 1 tablet (1 mg total) by mouth once daily. 90 tablet 1   lidocaine-prilocaine (EMLA) cream Apply 1 application topically daily as needed. 30 g 0   Multiple Vitamin (MULTI-VITAMINS) TABS Take 1 tablet by mouth daily.     albuterol (VENTOLIN HFA) 108 (90 Base) MCG/ACT inhaler Inhale 2 puffs into the lungs every 6 (six) hours as needed for wheezing or shortness of breath. (Patient not taking: Reported on 07/09/2021) 8.5 g 0   nystatin (MYCOSTATIN) 100000 UNIT/ML suspension Swish and spit 5 mLs (500,000 Units total) by mouth once every 8 (eight) hours as needed. (Patient not taking: Reported on 07/09/2021) 150 mL 0  ondansetron (ZOFRAN) 4 MG tablet Take 2 tablets (8mg  total) by mouth once every 8 hours as needed for nausea and vomiting. (Patient not taking: Reported on 07/09/2021) 80 tablet 1   prochlorperazine (COMPAZINE) 10 MG tablet Take 1 tablet (10 mg total) by mouth once every 6 (six) hours as needed for nausea or vomiting.  (Patient not taking: Reported on 07/09/2021) 40 tablet 1   No current facility-administered medications for this encounter.    ECOG PERFORMANCE STATUS:  0 - Asymptomatic  REVIEW OF SYSTEMS: Patient denies any weight loss, fatigue, weakness, fever, chills or night sweats. Patient denies any loss of vision, blurred vision. Patient denies any ringing  of the ears or hearing loss. No irregular heartbeat. Patient denies heart murmur or history of fainting. Patient denies any chest pain or pain radiating to her upper extremities. Patient denies any shortness of breath, difficulty breathing at night, cough or hemoptysis. Patient denies any swelling in the lower legs. Patient denies any nausea vomiting, vomiting of blood, or coffee ground material in the vomitus. Patient denies any stomach pain. Patient states has had normal bowel movements no significant constipation or diarrhea. Patient denies any dysuria, hematuria or significant nocturia. Patient denies any problems walking, swelling in the joints or loss of balance. Patient denies any skin changes, loss of hair or loss of weight. Patient denies any excessive worrying or anxiety or significant depression. Patient denies any problems with insomnia. Patient denies excessive thirst, polyuria, polydipsia. Patient denies any swollen glands, patient denies easy bruising or easy bleeding. Patient denies any recent infections, allergies or URI. Patient "s visual fields have not changed significantly in recent time.   PHYSICAL EXAM: BP 120/82   Pulse 89   Temp (!) 96.1 F (35.6 C) (Tympanic)   Wt 138 lb 1.6 oz (62.6 kg)   BMI 26.09 kg/m  Crude visual fields within normal range motor or sensory and DTR levels are equal and symmetric in upper and lower extremities.  Proprioception is intact.  Well-developed well-nourished patient in NAD. HEENT reveals PERLA, EOMI, discs not visualized.  Oral cavity is clear. No oral mucosal lesions are identified. Neck is clear  without evidence of cervical or supraclavicular adenopathy. Lungs are clear to A&P. Cardiac examination is essentially unremarkable with regular rate and rhythm without murmur rub or thrill. Abdomen is benign with no organomegaly or masses noted. Motor sensory and DTR levels are equal and symmetric in the upper and lower extremities. Cranial nerves II through XII are grossly intact. Proprioception is intact. No peripheral adenopathy or edema is identified. No motor or sensory levels are noted. Crude visual fields are within normal range.  LABORATORY DATA: Pathology report reviewed    RADIOLOGY RESULTS: CT scans PET CT scans and MRI scans of the brain are reviewed compatible with above-stated findings   IMPRESSION: Solitary brain metastasis progressing after treatment with other lesions in the brain decreasing in size and patient with known stage IV adenocarcinoma of the lung in 60 year old female  PLAN: This time of offered SRS.  We will plan on delivering [place] in single treatment.  Have ordered a thin cut MRI scan for treatment planning purposes we use that on her fusion study for treatment planning.  Risks and benefits of treatment including slight chance of brain necrosis in the area of the treatment field possible hair loss possible fatigue possible alteration of blood counts all were described in detail to the patient.  She seems to comprehend my treatment plan well.  We  will order her MRI scan as well as her simulation and proceed with single treatment SRS.  At some point may offer palliative radiation therapy to her right lung cancer after discussions with medical oncology.  I would like to take this opportunity to thank you for allowing me to participate in the care of your patient.Noreene Filbert, MD

## 2021-08-09 ENCOUNTER — Telehealth: Payer: Self-pay | Admitting: Pharmacy Technician

## 2021-08-09 ENCOUNTER — Telehealth: Payer: Self-pay | Admitting: *Deleted

## 2021-08-09 NOTE — Telephone Encounter (Signed)
Appointments given for MRI, CT simulation and SRS tx.   Patient verbalized understanding and read back all appointment dates and times.

## 2021-08-09 NOTE — Telephone Encounter (Signed)
Patient has Medicaid with prescription drug coverage.  No longer meets [facility]'s eligibility criteria.  Pt. notified. [facility]   [address] [place], [place]  [address]   08/08/21    [place] [address] [place], [place]  [address]  Dear Lattie Haw:  This is to inform you that you are no longer eligible to receive medication assistance at [facility].  The reason(s) are:    _____Your total gross monthly household income exceeds 250% of the Federal Poverty Level.   _____Tangible assets (savings, checking, stocks/bonds, pension, retirement, etc.) exceeds our limit  _____You are eligible to receive benefits from a Medicare Part "D" plan __X__You have prescription insurance with Medicaid _____You are not an [facility] resident _____Failure to provide all requested documentation (proof of income information for 2022., and/or Patient Intake Application, [place], Contract, etc).    We regret that we are unable to help you at this time.  If your prescription coverage is terminated, please contact [facility], so that we may reassess your eligibility for our program.  If you have questions, we may be contacted at [phone].  Thank you,  [facility]

## 2021-08-14 ENCOUNTER — Other Ambulatory Visit: Payer: Self-pay

## 2021-08-14 ENCOUNTER — Encounter: Payer: Self-pay | Admitting: Internal Medicine

## 2021-08-15 ENCOUNTER — Other Ambulatory Visit: Payer: Self-pay

## 2021-08-15 ENCOUNTER — Inpatient Hospital Stay: Payer: Medicaid Other | Attending: Internal Medicine

## 2021-08-15 ENCOUNTER — Inpatient Hospital Stay: Payer: Medicaid Other

## 2021-08-15 ENCOUNTER — Encounter: Payer: Self-pay | Admitting: Internal Medicine

## 2021-08-15 ENCOUNTER — Inpatient Hospital Stay (HOSPITAL_BASED_OUTPATIENT_CLINIC_OR_DEPARTMENT_OTHER): Payer: Medicaid Other | Admitting: Internal Medicine

## 2021-08-15 VITALS — BP 116/84 | HR 77 | Resp 16

## 2021-08-15 DIAGNOSIS — I480 Paroxysmal atrial fibrillation: Secondary | ICD-10-CM | POA: Diagnosis not present

## 2021-08-15 DIAGNOSIS — C3411 Malignant neoplasm of upper lobe, right bronchus or lung: Secondary | ICD-10-CM

## 2021-08-15 DIAGNOSIS — I3131 Malignant pericardial effusion in diseases classified elsewhere: Secondary | ICD-10-CM | POA: Diagnosis not present

## 2021-08-15 DIAGNOSIS — Z5111 Encounter for antineoplastic chemotherapy: Secondary | ICD-10-CM | POA: Diagnosis present

## 2021-08-15 DIAGNOSIS — C7931 Secondary malignant neoplasm of brain: Secondary | ICD-10-CM | POA: Insufficient documentation

## 2021-08-15 DIAGNOSIS — Z5112 Encounter for antineoplastic immunotherapy: Secondary | ICD-10-CM | POA: Diagnosis present

## 2021-08-15 LAB — COMPREHENSIVE METABOLIC PANEL
ALT: 41 U/L (ref 0–44)
AST: 59 U/L — ABNORMAL HIGH (ref 15–41)
Albumin: 3.7 g/dL (ref 3.5–5.0)
Alkaline Phosphatase: 82 U/L (ref 38–126)
Anion gap: 12 (ref 5–15)
BUN: 15 mg/dL (ref 6–20)
CO2: 25 mmol/L (ref 22–32)
Calcium: 9.5 mg/dL (ref 8.9–10.3)
Chloride: 102 mmol/L (ref 98–111)
Creatinine, Ser: 0.65 mg/dL (ref 0.44–1.00)
GFR, Estimated: >60 mL/min (ref >60)
Glucose, Bld: 147 mg/dL — ABNORMAL HIGH (ref 70–99)
Potassium: 3.8 mmol/L (ref 3.5–5.1)
Sodium: 139 mmol/L (ref 135–145)
Total Bilirubin: 0.3 mg/dL (ref 0.3–1.2)
Total Protein: 8 g/dL (ref 6.5–8.1)

## 2021-08-15 LAB — CBC WITH DIFFERENTIAL/PLATELET
Abs Immature Granulocytes: 0.09 10*3/uL — ABNORMAL HIGH (ref 0.00–0.07)
Basophils Absolute: 0.1 10*3/uL (ref 0.0–0.1)
Basophils Relative: 1 %
Eosinophils Absolute: 0.4 10*3/uL (ref 0.0–0.5)
Eosinophils Relative: 3 %
HCT: 39.1 % (ref 36.0–46.0)
Hemoglobin: 12.9 g/dL (ref 12.0–15.0)
Immature Granulocytes: 1 %
Lymphocytes Relative: 21 %
Lymphs Abs: 2.4 10*3/uL (ref 0.7–4.0)
MCH: 32.2 pg (ref 26.0–34.0)
MCHC: 33 g/dL (ref 30.0–36.0)
MCV: 97.5 fL (ref 80.0–100.0)
Monocytes Absolute: 0.9 10*3/uL (ref 0.1–1.0)
Monocytes Relative: 8 %
Neutro Abs: 7.7 10*3/uL (ref 1.7–7.7)
Neutrophils Relative %: 66 %
Platelets: 331 10*3/uL (ref 150–400)
RBC: 4.01 MIL/uL (ref 3.87–5.11)
RDW: 15.6 % — ABNORMAL HIGH (ref 11.5–15.5)
WBC: 11.5 10*3/uL — ABNORMAL HIGH (ref 4.0–10.5)
nRBC: 0 % (ref 0.0–0.2)

## 2021-08-15 MED ORDER — SODIUM CHLORIDE 0.9 % IV SOLN
500.0000 mg/m2 | Freq: Once | INTRAVENOUS | Status: AC
  Administered 2021-08-15: 800 mg via INTRAVENOUS
  Filled 2021-08-15: qty 20

## 2021-08-15 MED ORDER — SODIUM CHLORIDE 0.9% FLUSH
10.0000 mL | INTRAVENOUS | Status: DC | PRN
  Filled 2021-08-15: qty 10

## 2021-08-15 MED ORDER — HEPARIN SOD (PORK) LOCK FLUSH 100 UNIT/ML IV SOLN
500.0000 [IU] | Freq: Once | INTRAVENOUS | Status: AC | PRN
  Filled 2021-08-15: qty 5

## 2021-08-15 MED ORDER — CYANOCOBALAMIN 1000 MCG/ML IJ SOLN
1000.0000 ug | Freq: Once | INTRAMUSCULAR | Status: AC
  Administered 2021-08-15: 1000 ug via INTRAMUSCULAR
  Filled 2021-08-15: qty 1

## 2021-08-15 MED ORDER — HEPARIN SOD (PORK) LOCK FLUSH 100 UNIT/ML IV SOLN
INTRAVENOUS | Status: AC
  Administered 2021-08-15: 500 [IU]
  Filled 2021-08-15: qty 5

## 2021-08-15 MED ORDER — PROCHLORPERAZINE MALEATE 10 MG PO TABS
10.0000 mg | ORAL_TABLET | Freq: Once | ORAL | Status: AC
  Administered 2021-08-15: 10 mg via ORAL
  Filled 2021-08-15: qty 1

## 2021-08-15 MED ORDER — SODIUM CHLORIDE 0.9 % IV SOLN
200.0000 mg | Freq: Once | INTRAVENOUS | Status: AC
  Administered 2021-08-15: 200 mg via INTRAVENOUS
  Filled 2021-08-15: qty 8

## 2021-08-15 MED ORDER — SODIUM CHLORIDE 0.9 % IV SOLN
Freq: Once | INTRAVENOUS | Status: AC
  Filled 2021-08-15: qty 250

## 2021-08-15 NOTE — Patient Instructions (Signed)
[facility] [place]  Discharge Instructions: Thank you for choosing [facility] to provide your oncology and hematology care.  If you have a lab appointment with the [facility], please go directly to the [facility] and check in at the registration area.  Wear comfortable clothing and clothing appropriate for easy access to any Portacath or PICC line.   We strive to give you quality time with your provider. You may need to reschedule your appointment if you arrive late (15 or more minutes).  Arriving late affects you and other patients whose appointments are after yours.  Also, if you miss three or more appointments without notifying the office, you may be dismissed from the clinic at the provider's discretion.      For prescription refill requests, have your pharmacy contact our office and allow 72 hours for refills to be completed.    Today you received the following chemotherapy and/or immunotherapy agents Alimta & Keytruda      To help prevent nausea and vomiting after your treatment, we encourage you to take your nausea medication as directed.  BELOW ARE SYMPTOMS THAT SHOULD BE REPORTED IMMEDIATELY: *FEVER GREATER THAN 100.4 F (38 C) OR HIGHER *CHILLS OR SWEATING *NAUSEA AND VOMITING THAT IS NOT CONTROLLED WITH YOUR NAUSEA MEDICATION *UNUSUAL SHORTNESS OF BREATH *UNUSUAL BRUISING OR BLEEDING *URINARY PROBLEMS (pain or burning when urinating, or frequent urination) *BOWEL PROBLEMS (unusual diarrhea, constipation, pain near the anus) TENDERNESS IN MOUTH AND THROAT WITH OR WITHOUT PRESENCE OF ULCERS (sore throat, sores in mouth, or a toothache) UNUSUAL RASH, SWELLING OR PAIN  UNUSUAL VAGINAL DISCHARGE OR ITCHING   Items with * indicate a potential emergency and should be followed up as soon as possible or go to the Emergency Department if any problems should occur.  Please show the CHEMOTHERAPY ALERT CARD or IMMUNOTHERAPY ALERT CARD at  check-in to the Emergency Department and triage nurse.  Should you have questions after your visit or need to cancel or reschedule your appointment, please contact [facility] [place] [facility] [place]  [phone] and follow the prompts.  Office hours are 8:00 a.m. to 4:30 p.m. Monday - Friday. Please note that voicemails left after 4:00 p.m. may not be returned until the following business day.  We are closed weekends and major holidays. You have access to a nurse at all times for urgent questions. Please call the main number to the clinic [phone] and follow the prompts.  For any non-urgent questions, you may also contact your provider using MyChart. We now offer e-Visits for anyone 74 and older to request care online for non-urgent symptoms. For details visit [email or web address].   Also download the MyChart app! Go to the app store, search "MyChart", open the app, select [facility], and log in with your MyChart username and password.  Due to Covid, a mask is required upon entering the hospital/clinic. If you do not have a mask, one will be given to you upon arrival. For doctor visits, patients may have 1 support person aged 48 or older with them. For treatment visits, patients cannot have anyone with them due to current Covid guidelines and our immunocompromised population.

## 2021-08-15 NOTE — Progress Notes (Signed)
[facility] CONSULT NOTE  Patient Care Team: Practice, [facility] (Inactive) as PCP - General End, Harrell Gave, MD as PCP - Cardiology (Cardiology) Telford Nab, RN as Oncology Nurse Navigator  CHIEF COMPLAINTS/PURPOSE OF CONSULTATION: Lung cancer  #  Oncology History Overview Note  # AUG 2022-stage IV adenocarcinoma of the lung [s/p pericardial effusion/tamponade]. MRI Brain AUG 16th, 2022- IMPRESSION: Multiple peripherally enhancing, centrally T1 hypointense lesions, with surrounding T2 hyperintense signal in the bilateral cerebral hemispheres, concerning for metastatic disease.    # AUG 17th 2022- CARBO-ALIMTA-Keytruda #1  #Acute respiratory failure BiPAP/ICU.  S/p pericardiocentesis.  NGS; PDL-1-0    Malignant pericardial effusion  03/28/2021 Initial Diagnosis   Malignant pericardial effusion ([place])   04/25/2021 -  Chemotherapy   Patient is on Treatment Plan : LUNG CARBOplatin / Pemetrexed / Pembrolizumab q21d Induction x 4 cycles / Maintenance Pemetrexed + Pembrolizumab     Cancer of upper lobe of right lung ([place])  04/10/2021 Initial Diagnosis   Cancer of upper lobe of right lung ([place])   04/10/2021 Cancer Staging   Staging form: Lung, AJCC 8th Edition - Clinical: Stage IVA (cT2, cN2, cM1a) - Signed by Cammie Sickle, MD on 04/10/2021       HISTORY OF PRESENTING ILLNESS: Accompanied by family; ambulating independently.  [place] 60 y.o.  female  stage IV adenocarcinoma the lung cancer is here currently on chemotherapy-immunotherapy is here for a follow up/review results of the MRI.  In the interim patient has been evaluated by radiation oncology.   Denies any worsening shortness of breath or cough.  No fever no chills.  Appetite improving.  She is more ambulatory at this time.  No headaches.  Review of Systems  Constitutional:  Positive for malaise/fatigue. Negative for chills, diaphoresis and fever.  HENT:  Negative for nosebleeds and  sore throat.   Eyes:  Negative for double vision.  Respiratory:  Positive for shortness of breath. Negative for cough, hemoptysis, sputum production and wheezing.   Cardiovascular:  Negative for chest pain, palpitations, orthopnea and leg swelling.  Gastrointestinal:  Negative for abdominal pain, blood in stool, constipation, diarrhea, heartburn, melena, nausea and vomiting.  Genitourinary:  Negative for dysuria, frequency and urgency.  Musculoskeletal:  Negative for back pain and joint pain.  Skin: Negative.  Negative for itching and rash.  Neurological:  Negative for dizziness, tingling, focal weakness, weakness and headaches.  Endo/Heme/Allergies:  Does not bruise/bleed easily.  Psychiatric/Behavioral:  Negative for depression. The patient is not nervous/anxious and does not have insomnia.     MEDICAL HISTORY:  Past Medical History:  Diagnosis Date  . Graves disease   . Malignant pericardial effusion   . Paroxysmal atrial fibrillation ([facility])   . Primary lung adenocarcinoma ([place])     SURGICAL HISTORY: Past Surgical History:  Procedure Laterality Date  . IR CV LINE INJECTION  06/19/2021  . IR IMAGING GUIDED PORT INSERTION  04/27/2021  . PERICARDIOCENTESIS N/A 03/27/2021   Procedure: PERICARDIOCENTESIS;  Surgeon: Nelva Bush, MD;  Location: [facility];  Service: Cardiovascular;  Laterality: N/A;  . TUBAL LIGATION      SOCIAL HISTORY: Social History   Socioeconomic History  . Marital status: Widowed    Spouse name: Not on file  . Number of children: Not on file  . Years of education: Not on file  . Highest education level: Not on file  Occupational History  . Not on file  Tobacco Use  . Smoking status: Former  Packs/day: 1.00    Years: 40.00    Pack years: 40.00    Types: Cigarettes  . Smokeless tobacco: Never  Vaping Use  . Vaping Use: Never used  Substance and Sexual Activity  . Alcohol use: No  . Drug use: Yes    Types: Marijuana  . Sexual  activity: Not on file  Other Topics Concern  . Not on file  Social History Narrative  . Not on file   Social Determinants of Health   Financial Resource Strain: High Risk  . Difficulty of Paying Living Expenses: Very hard  Food Insecurity: Not on file  Transportation Needs: Not on file  Physical Activity: Not on file  Stress: Stress Concern Present  . Feeling of Stress : Very much  Social Connections: Not on file  Intimate Partner Violence: Not on file    FAMILY HISTORY: Family History  Problem Relation Age of Onset  . Peripheral Artery Disease Mother     ALLERGIES:  is allergic to diazepam, metoprolol, and tapazole [methimazole].  MEDICATIONS:  Current Outpatient Medications  Medication Sig Dispense Refill  . bismuth subsalicylate (PEPTO BISMOL) 262 MG/15ML suspension Take 30 mLs by mouth every 6 (six) hours as needed.    . diltiazem (DILACOR XR) 240 MG 24 hr capsule Take 1 capsule (240 mg total) by mouth once daily. 90 capsule 3  . folic acid (FOLVITE) 1 MG tablet Take 1 tablet (1 mg total) by mouth once daily. 90 tablet 1  . lidocaine-prilocaine (EMLA) cream Apply 1 application topically daily as needed. 30 g 0  . Multiple Vitamin (MULTI-VITAMINS) TABS Take 1 tablet by mouth daily.    Marland Kitchen albuterol (VENTOLIN HFA) 108 (90 Base) MCG/ACT inhaler Inhale 2 puffs into the lungs every 6 (six) hours as needed for wheezing or shortness of breath. (Patient not taking: Reported on 07/09/2021) 8.5 g 0  . nystatin (MYCOSTATIN) 100000 UNIT/ML suspension Swish and spit 5 mLs (500,000 Units total) by mouth once every 8 (eight) hours as needed. (Patient not taking: Reported on 07/09/2021) 150 mL 0  . ondansetron (ZOFRAN) 4 MG tablet Take 2 tablets (74m total) by mouth once every 8 hours as needed for nausea and vomiting. (Patient not taking: Reported on 07/09/2021) 80 tablet 1  . prochlorperazine (COMPAZINE) 10 MG tablet Take 1 tablet (10 mg total) by mouth once every 6 (six) hours as needed  for nausea or vomiting. (Patient not taking: Reported on 07/09/2021) 40 tablet 1   No current facility-administered medications for this visit.   Facility-Administered Medications Ordered in Other Visits  Medication Dose Route Frequency Provider Last Rate Last Admin  . heparin lock flush 100 unit/mL  500 Units Intracatheter Once PRN BCharlaine DaltonR, MD      . PEMEtrexed (ALIMTA) 800 mg in sodium chloride 0.9 % 100 mL chemo infusion  500 mg/m2 (Treatment Plan Recorded) Intravenous Once BCharlaine DaltonR, MD      . sodium chloride flush (NS) 0.9 % injection 10 mL  10 mL Intracatheter PRN BCammie Sickle MD          .  PHYSICAL EXAMINATION: ECOG PERFORMANCE STATUS: 1 - Symptomatic but completely ambulatory  Vitals:   08/15/21 0835  BP: 115/79  Pulse: 91  Resp: 18  Temp: (!) 96.7 F (35.9 C)  SpO2: 96%   Filed Weights   08/15/21 0835  Weight: 138 lb (62.6 kg)    Physical Exam Vitals and nursing note reviewed.  HENT:     Head: Normocephalic  and atraumatic.     Mouth/Throat:     Pharynx: Oropharynx is clear.  Eyes:     Extraocular Movements: Extraocular movements intact.     Pupils: Pupils are equal, round, and reactive to light.  Cardiovascular:     Rate and Rhythm: Normal rate and regular rhythm.  Pulmonary:     Comments: Decreased breath sounds bilaterally.  Abdominal:     Palpations: Abdomen is soft.  Musculoskeletal:        General: Normal range of motion.     Cervical back: Normal range of motion.  Skin:    General: Skin is warm.  Neurological:     General: No focal deficit present.     Mental Status: She is alert and oriented to person, place, and time.  Psychiatric:        Behavior: Behavior normal.        Judgment: Judgment normal.     LABORATORY DATA:  I have reviewed the data as listed Lab Results  Component Value Date   WBC 11.5 (H) 08/15/2021   HGB 12.9 08/15/2021   HCT 39.1 08/15/2021   MCV 97.5 08/15/2021   PLT 331  08/15/2021   Recent Labs    03/27/21 1635 03/28/21 0131 07/09/21 1013 07/25/21 0808 08/15/21 0817  NA  --    < > 137 137 139  K  --    < > 3.7 3.7 3.8  CL  --    < > 102 102 102  CO2  --    < > 27 24 25   GLUCOSE  --    < > 126* 164* 147*  BUN  --    < > 9 12 15   CREATININE  --    < > 0.57 0.62 0.65  CALCIUM  --    < > 9.2 9.5 9.5  GFRNONAA  --    < > >60 >60 >60  PROT 7.7   < > 7.2 8.4* 8.0  ALBUMIN 3.2*   < > 3.7 4.0 3.7  AST 31   < > 34 54* 59*  ALT 21   < > 32 42 41  ALKPHOS 74   < > 72 83 82  BILITOT 1.2   < > 0.3 0.3 0.3  BILIDIR 0.4*  --   --   --   --   IBILI 0.8  --   --   --   --    < > = values in this interval not displayed.    RADIOGRAPHIC STUDIES: I have personally reviewed the radiological images as listed and agreed with the findings in the report. MR BRAIN W WO CONTRAST  Result Date: 07/31/2021 CLINICAL DATA:  Brain/CNS neoplasm, assess treatment response. Metastatic lung cancer. EXAM: MRI HEAD WITHOUT AND WITH CONTRAST TECHNIQUE: Multiplanar, multiecho pulse sequences of the brain and surrounding structures were obtained without and with intravenous contrast. CONTRAST:  67m GADAVIST GADOBUTROL 1 MMOL/ML IV SOLN COMPARISON:  MR head 04/24/2021. FINDINGS: Brain: A peripherally enhancing lesion in the left parietal lobe has mildly enlarged, now measuring 11 mm (series 18, image 110 and series 19, image 9, previously 9 mm) with increased, mild surrounding edema. An enhancing lesion in the left frontal lobe has decreased in size, now measuring 2 mm (series 20, image 14) without edema. A lesion in the posterior right temporal lobe has also decreased in size without appreciable residual enhancement. There is a new small focus of chronic hemorrhage in the posterior left frontal lobe (series  13, image 45) with a questionable 1 mm focus of enhancement in this region (sagittal series 20, image 15; not confirmed on axial or coronal sequences and without significant edema).  There is a nearby subcentimeter focus of trace diffusion weighted signal hyperintensity without reduced ADC in the subcortical white matter of the posterior left frontal lobe potentially reflective of a subacute infarct. The ventricles and sulci are normal. There is no midline shift or extra-axial fluid collection. Vascular: Major intracranial vascular flow voids are preserved. Skull and upper cervical spine: Unremarkable bone marrow signal para Sinuses/Orbits: Unremarkable orbits. Mild mucosal thickening in the paranasal sinuses, improved from prior. Subtotal opacification of a small right sphenoid sinus by secretions. Small right mastoid effusion. Other: None. IMPRESSION: 1. Mildly increased size of a left parietal metastasis with increased, mild edema. 2. Decreased size of 2 other metastases. 3. Questionable new 1 mm lesion in the posterior left frontal lobe versus subacute infarct with chronic blood products. Short-term follow-up recommended. Electronically Signed   By: Logan Bores M.D.   On: 07/31/2021 14:52    ASSESSMENT & PLAN:   Cancer of upper lobe of right lung ([place]) #Stage IV-lung cancer-adenocarcinoma. AUG 2022- MRI brain-subcentimeter brain lesions [asymptomatic-see below];  S/p carbo Alimta Keytruda cycle #4. OCT, 24th 2022-partial response noted with improvement of the right upper lobe lesion; and also improvement of the lymphangitic carcinomatosis; along with decrease in size of index left supraclavicular and mediastinal lymph nodes.  No evidence for metastatic disease to the abdomen or pelvis.   # Continue with Alimta Keytruda maintenance Labs today reviewed;  acceptable for treatment today.  We will repeat CT scan in late January 2023; order scan at next visit.  We will move the chemotherapy to 4 weeks/as awaiting radiation end of the month.  # Subcentimeter brain lesions-clinically asymptomatic. Repeat MRI brain- 11/22 Mildly increased size of a left parietal metastasis with increased,  mild edema. Decreased size of 2 other metastases. Questionable new 1 mm lesion in the posterior left frontal lobe versus subacute infarct with chronic blood products.  Awaiting to start [place] on 12/20. [thin cut MRI on 11/14]. Discussed with Dr.Crystal.     # Malignant pericardial tamponade-s/p pericardiocentesis;-secondary to lung adenocarcinoma-repeat AUG 2022-2D echo negative for any pericardial effusion-STABLE.   # chronic respiratory failure-multifactorial-malignancy/lymphangitic spread/COPD stable-significant provement s/p/bronchodilators/diuretics- STABLE  #A. Fib-amiodarone/Cardizem as per cardiology-  Not on anti-coagulation [?  Hemorrhagic pericardial effusion]- STABLE; awaiting repeat Cards eval in Jan 2023.   # COVID VACCINATION: Un-vaccinated; DECLINES/declined- EVUSHELD.   # Port malfunction: STABLE.   # DISPOSITION: # chemo today; B12 injection.  # follow up in 4 weeks;  MD; labs- cbc/cmp;; Alimta-[email or web address].B  # I reviewed the blood work- with the patient in detail; also reviewed the imaging independently [as summarized above]; and with the patient in detail.      All questions were answered. The patient knows to call the clinic with any problems, questions or concerns.    Cammie Sickle, MD 08/15/2021 9:57 AM

## 2021-08-15 NOTE — Progress Notes (Signed)
Patient denies new problems/concerns today.   °

## 2021-08-15 NOTE — Assessment & Plan Note (Signed)
#  Stage IV-lung cancer-adenocarcinoma. AUG 2022- MRI brain-subcentimeter brain lesions [asymptomatic-see below];  S/p carbo Alimta Keytruda cycle #4. OCT, 24th 2022-partial response noted with improvement of the right upper lobe lesion; and also improvement of the lymphangitic carcinomatosis; along with decrease in size of index left supraclavicular and mediastinal lymph nodes.  No evidence for metastatic disease to the abdomen or pelvis.   # Continue with Alimta Keytruda maintenance Labs today reviewed;  acceptable for treatment today.  We will repeat CT scan in late January 2023; order scan at next visit.  We will move the chemotherapy to 4 weeks/as awaiting radiation end of the month.  # Subcentimeter brain lesions-clinically asymptomatic. Repeat MRI brain- 11/22 Mildly increased size of a left parietal metastasis with increased, mild edema. Decreased size of 2 other metastases. Questionable new 1 mm lesion in the posterior left frontal lobe versus subacute infarct with chronic blood products.  Awaiting to start [place] on 12/20. [thin cut MRI on 11/14]. Discussed with Dr.Crystal.   # Malignant pericardial tamponade-s/p pericardiocentesis;-secondary to lung adenocarcinoma-repeat AUG 2022-2D echo negative for any pericardial effusion-STABLE.   # chronic respiratory failure-multifactorial-malignancy/lymphangitic spread/COPD stable-significant provement s/p/bronchodilators/diuretics- STABLE  #A. Fib-amiodarone/Cardizem as per cardiology-  Not on anti-coagulation [?  Hemorrhagic pericardial effusion]- STABLE; awaiting repeat Cards eval in Jan 2023.   # COVID VACCINATION: Un-vaccinated; DECLINES/declined- [place]HE[place].   # Port malfunction: STABLE.   # DISPOSITION: # chemo today; B12 injection.  # follow up in 4 weeks;  MD; labs- cbc/cmp;; Alimta-keytrudaDr.B  # I reviewed the blood work- with the patient in detail; also reviewed the imaging independently [as summarized above]; and with the patient in  detail.

## 2021-08-17 ENCOUNTER — Other Ambulatory Visit: Payer: Self-pay

## 2021-08-22 ENCOUNTER — Ambulatory Visit
Admission: RE | Admit: 2021-08-22 | Discharge: 2021-08-22 | Disposition: A | Discharge status: Home / Self Care | Payer: Medicaid Other | Source: Ambulatory Visit | Attending: Radiation Oncology | Admitting: Radiation Oncology

## 2021-08-22 DIAGNOSIS — C7931 Secondary malignant neoplasm of brain: Secondary | ICD-10-CM | POA: Insufficient documentation

## 2021-08-22 MED ORDER — GADOBUTROL 1 MMOL/ML IV SOLN
6.0000 mL | Freq: Once | INTRAVENOUS | Status: AC | PRN
  Administered 2021-08-22: 12:00:00 6 mL via INTRAVENOUS

## 2021-08-23 ENCOUNTER — Ambulatory Visit
Admission: RE | Admit: 2021-08-23 | Discharge: 2021-08-23 | Disposition: A | Discharge status: Home / Self Care | Payer: Medicaid Other | Source: Ambulatory Visit | Attending: Radiation Oncology | Admitting: Radiation Oncology

## 2021-08-23 DIAGNOSIS — Z510 Encounter for antineoplastic radiation therapy: Secondary | ICD-10-CM | POA: Insufficient documentation

## 2021-08-23 DIAGNOSIS — C3411 Malignant neoplasm of upper lobe, right bronchus or lung: Secondary | ICD-10-CM | POA: Diagnosis present

## 2021-08-24 ENCOUNTER — Other Ambulatory Visit: Payer: Self-pay | Admitting: *Deleted

## 2021-08-24 MED ORDER — LANSOPRAZOLE 30 MG PO CPDR: 30.0000 mg | DELAYED_RELEASE_CAPSULE | Freq: Every day | ORAL | 0 refills | Status: DC

## 2021-08-24 MED ORDER — DEXAMETHASONE 2 MG PO TABS: 4.0000 mg | ORAL_TABLET | Freq: Two times a day (BID) | ORAL | 0 refills | Status: DC

## 2021-08-25 DIAGNOSIS — Z510 Encounter for antineoplastic radiation therapy: Secondary | ICD-10-CM | POA: Diagnosis not present

## 2021-08-26 ENCOUNTER — Other Ambulatory Visit: Payer: Self-pay

## 2021-08-28 ENCOUNTER — Ambulatory Visit
Admission: RE | Admit: 2021-08-28 | Discharge: 2021-08-28 | Disposition: A | Discharge status: Home / Self Care | Payer: Medicaid Other | Source: Ambulatory Visit | Attending: Radiation Oncology | Admitting: Radiation Oncology

## 2021-08-28 DIAGNOSIS — Z510 Encounter for antineoplastic radiation therapy: Secondary | ICD-10-CM | POA: Diagnosis not present

## 2021-09-06 NOTE — Progress Notes (Signed)
Cardiology Office Note    Date:  09/11/2021   ID:  Nichole Park, DOB 08-04-1961, MRN [id number]  PCP:  [facility] (Inactive)  Cardiologist:  Nelva Bush, MD  Electrophysiologist:  None   Chief Complaint: Follow-up  History of Present Illness:   Nichole Park is a 60 y.o. 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  She was admitted in 03/2021 with a pericardial effusion with evidence of cardiac tamponade and underwent emergent pericardiocentesis with removal of 500 mL of serosanguineous fluid.  Echo the following day demonstrated an EF of 55%, no regional wall motion abnormalities, grade 2 diastolic dysfunction, normal RV systolic function and ventricular cavity size, no significant valvular abnormalities, and a trivial pericardial effusion.  Most recent echo from 06/2021 demonstrated an EF of 60 to 65%, no regional wall motion abnormalities, normal RV systolic function and ventricular cavity size, normal PASP, and a trivial pericardial effusion with previously noted echogenic material in the pericardial space essentially resolved.  She was last seen in the office on 07/04/2021 and was feeling fairly well with intermittent fatigue that was most pronounced while undergoing chemotherapy.  She was maintaining sinus rhythm without symptoms suggestive of recurrence of A. fib.  Amiodarone was discontinued.  [place] continued to be deferred in the context of a CHA2DS2-VASc of 1 (sex category), and in the setting of elevated bleeding risk with history of malignant pericardial effusion/metastatic lung cancer.  She comes in accompanied by her boyfriend today and is doing very well from a cardiac perspective.  No chest pain, dyspnea, dizziness, presyncope,  syncope, or lower extremity swelling.  No orthopnea or early satiety.  She did notice a brief episode of tachypalpitations in November that were attributed to the reinitiation of caffeine.  With discontinuation of caffeine palpitations have resolved.  She has recently come off of steroids as part of her oncology treatment.  With the addition of steroids she did notice some weight gain.  She is not watching her salt intake.  Otherwise, she does not have any issues or concerns from a cardiac perspective at this time.   Labs independently reviewed: 08/2021 - Hgb 12.9, PLT 331, potassium 3.8, BUN 15, serum creatinine 0.65, albumin 3.7, AST 59, ALT normal 07/2021 - TSH normal 04/2021 - magnesium 2.7 03/2021 - A1c 6.2  Past Medical History:  Diagnosis Date   Graves disease    Malignant pericardial effusion    Paroxysmal atrial fibrillation ([place])    Primary lung adenocarcinoma [facility])     Past Surgical History:  Procedure Laterality Date   IR CV LINE INJECTION  06/19/2021   IR IMAGING GUIDED PORT INSERTION  04/27/2021   PERICARDIOCENTESIS N/A 03/27/2021   Procedure: PERICARDIOCENTESIS;  Surgeon: Nelva Bush, MD;  Location: [facility];  Service: Cardiovascular;  Laterality: N/A;   TUBAL LIGATION      Current Medications: Current Meds  Medication Sig   bismuth subsalicylate (PEPTO BISMOL) 262 MG/15ML suspension Take 30 mLs by mouth every 6 (six) hours as needed.   diltiazem (DILACOR XR) 240 MG 24 hr capsule Take 1 capsule (240 mg total) by mouth once daily.   folic acid (FOLVITE) 1 MG tablet Take 1 tablet (1 mg total) by mouth once daily.   lidocaine-prilocaine (EMLA) cream Apply 1 application topically daily as needed.  Multiple Vitamin (MULTI-VITAMINS) TABS Take 1 tablet by mouth daily.    Allergies:   Diazepam, Metoprolol, and Tapazole [methimazole]   Social History   Socioeconomic History   Marital status: Widowed    Spouse name: Not on file   Number of children: Not  on file   Years of education: Not on file   Highest education level: Not on file  Occupational History   Not on file  Tobacco Use   Smoking status: Former    Packs/day: 1.00    Years: 40.00    Pack years: 40.00    Types: Cigarettes   Smokeless tobacco: Never  Vaping Use   Vaping Use: Never used  Substance and Sexual Activity   Alcohol use: No   Drug use: Yes    Types: Marijuana   Sexual activity: Not on file  Other Topics Concern   Not on file  Social History Narrative   Not on file   Social Determinants of Health   Financial Resource Strain: High Risk   Difficulty of Paying Living Expenses: Very hard  Food Insecurity: Not on file  Transportation Needs: Not on file  Physical Activity: Not on file  Stress: Stress Concern Present   Feeling of Stress : Very much  Social Connections: Not on file     Family History:  The patient's family history includes Peripheral Artery Disease in her mother.  ROS:   Review of Systems  Constitutional:  Positive for malaise/fatigue. Negative for chills, diaphoresis, fever and weight loss.  HENT:  Negative for congestion.   Eyes:  Negative for discharge and redness.  Respiratory:  Negative for cough, sputum production, shortness of breath and wheezing.   Cardiovascular:  Negative for chest pain, palpitations, orthopnea, claudication, leg swelling and PND.  Gastrointestinal:  Negative for abdominal pain, heartburn, nausea and vomiting.  Musculoskeletal:  Negative for falls and myalgias.  Skin:  Negative for rash.  Neurological:  Negative for dizziness, tingling, tremors, sensory change, speech change, focal weakness, loss of consciousness and weakness.  Endo/Heme/Allergies:  Does not bruise/bleed easily.  Psychiatric/Behavioral:  Negative for substance abuse. The patient is not nervous/anxious.   All other systems reviewed and are negative.   EKGs/Labs/Other Studies Reviewed:    Studies reviewed were summarized above. The additional  studies were reviewed today:  Pericardiocentesis 03/27/2021: Conclusions: Successful pericardiocentesis and tube pericardiotomy via the subxiphoid approach, yielding 500 mL of serosanginous fluid.   Recommendations: Admit to ICU. Obtain STAT portable CXR. Follow-up fluid analyses, including cytology. Repeat echocardiogram tomorrow.  Anticipate removal of drain when output is less than 50-100 mL/24 hours and minimal residual fluid is present by echo. __________  2D echo 03/28/2021: 1. Left ventricular ejection fraction, by estimation, is 55%. The left  ventricle has normal function. The left ventricle has no regional wall  motion abnormalities. Left ventricular diastolic parameters are consistent  with Grade II diastolic dysfunction  (pseudonormalization).   2. Right ventricular systolic function is normal. The right ventricular  size is normal.   3. The mitral valve is normal in structure. No evidence of mitral valve  regurgitation.   4. The aortic valve was not well visualized. Aortic valve regurgitation  is not visualized.   5. The inferior vena cava is dilated in size with >50% respiratory  variability, suggesting right atrial pressure of 8 mmHg. __________  Limited echo 03/29/2021: 1. Trvial pericardial effusion.   2. Left ventricular ejection fraction, by estimation, is 60 to 65%. The  left ventricle has normal  function. The left ventricle has no regional  wall motion abnormalities.   3. Right ventricular systolic function is normal. The right ventricular  size is normal.   4. The mitral valve is normal in structure. No evidence of mitral valve  regurgitation. No evidence of mitral stenosis. __________  Limited echo 04/02/2021: 1. Left ventricular ejection fraction, by estimation, is >55%. The left  ventricle has normal function.   2. Right ventricular systolic function is normal. Moderately increased  right ventricular wall thickness.   3. Echogenic material in the  pericardial space is noted, which could  represent epicardial fat. However, in the setting of malignant pericardial  effusion, tumor cannot be excluded. __________  Limited echo 06/21/2021:1. Left ventricular ejection fraction, by estimation, is 60 to 65%. The  left ventricle has normal function. The left ventricle has no regional  wall motion abnormalities.   2. Right ventricular systolic function is normal. The right ventricular  size is normal. There is normal pulmonary artery systolic pressure. The  estimated right ventricular systolic pressure is 67.8 mmHg.   3. Trivial pericardial effusion is present. Previously noted echogenic  material in pericardial space has essentially resolved.   Comparison(s): LVEF >55%    EKG:  EKG is ordered today.  The EKG ordered today demonstrates NSR, 76 bpm, right axis deviation, nonspecific ST-T changes  Recent Labs: 04/03/2021: B Natriuretic Peptide 382.5 04/09/2021: Magnesium 2.7 07/25/2021: TSH 2.244 08/15/2021: ALT 41; BUN 15; Creatinine, Ser 0.65; Hemoglobin 12.9; Platelets 331; Potassium 3.8; Sodium 139  Recent Lipid Panel No results found for: CHOL, TRIG, HDL, CHOLHDL, VLDL, LDLCALC, LDLDIRECT  PHYSICAL EXAM:    VS:  BP 120/70 (BP Location: Left Arm, Patient Position: Sitting, Cuff Size: Normal)    Pulse 76    Ht 5' 1.5" (1.562 m)    Wt 144 lb 6 oz (65.5 kg)    SpO2 97%    BMI 26.84 kg/m   BMI: Body mass index is 26.84 kg/m.  Physical Exam Vitals reviewed.  Constitutional:      Appearance: She is well-developed.  HENT:     Head: Normocephalic and atraumatic.  Eyes:     General:        Right eye: No discharge.        Left eye: No discharge.  Neck:     Vascular: No JVD.  Cardiovascular:     Rate and Rhythm: Normal rate and regular rhythm.     Pulses:          Posterior tibial pulses are 2+ on the right side and 2+ on the left side.     Heart sounds: Normal heart sounds, S1 normal and S2 normal. Heart sounds not distant. No  midsystolic click and no opening snap. No murmur heard.   No friction rub.  Pulmonary:     Effort: Pulmonary effort is normal. No respiratory distress.     Breath sounds: Normal breath sounds. No decreased breath sounds, wheezing or rales.  Chest:     Chest wall: No tenderness.  Abdominal:     General: There is no distension.     Palpations: Abdomen is soft.     Tenderness: There is no abdominal tenderness.  Musculoskeletal:     Cervical back: Normal range of motion.     Right lower leg: No edema.     Left lower leg: No edema.  Skin:    General: Skin is warm and dry.     Nails: There is no clubbing.  Neurological:  Mental Status: She is alert and oriented to person, place, and time.  Psychiatric:        Speech: Speech normal.        Behavior: Behavior normal.        Thought Content: Thought content normal.        Judgment: Judgment normal.    Wt Readings from Last 3 Encounters:  09/11/21 144 lb 6 oz (65.5 kg)  08/15/21 138 lb (62.6 kg)  08/07/21 138 lb 1.6 oz (62.6 kg)     ASSESSMENT & PLAN:   Malignant pericardial effusion: Most recent echo from 06/2021 demonstrated near complete resolution of pericardial effusion with only trivial amount of fluid remaining.  She is hemodynamically stable and asymptomatic.  We will plan to pursue a limited echo in mid February 2023.  Ongoing treatment of her metastatic lung carcinoma per oncology.  PAF: She is maintaining sinus rhythm.  She did briefly notice palpitations with the reinitiation of caffeine.  With discontinuation of caffeine symptoms resolved.  She remains on Cardizem CD.  QT interval has improved off amiodarone.  She remains off [place] in the context of a CHA2DS2-VASc of 1 (sex category), and elevated bleeding risk in the setting of malignant pericardial effusion with metastatic lung cancer.   Disposition: F/u with Dr. Saunders Revel or an APP in 2 months.   Medication Adjustments/Labs and Tests Ordered: Current medicines are  reviewed at length with the patient today.  Concerns regarding medicines are outlined above. Medication changes, Labs and Tests ordered today are summarized above and listed in the Patient Instructions accessible in Encounters.   Signed, Christell Faith, PA-C 09/11/2021 4:50 PM     [facility] [address] [place], [place] [address] [phone]

## 2021-09-11 ENCOUNTER — Other Ambulatory Visit: Payer: Self-pay

## 2021-09-11 ENCOUNTER — Encounter: Payer: Self-pay | Admitting: Physician Assistant

## 2021-09-11 ENCOUNTER — Ambulatory Visit (INDEPENDENT_AMBULATORY_CARE_PROVIDER_SITE_OTHER): Payer: Medicaid Other | Admitting: Physician Assistant

## 2021-09-11 VITALS — BP 120/70 | HR 76 | Ht 61.5 in | Wt 144.4 lb

## 2021-09-11 DIAGNOSIS — I480 Paroxysmal atrial fibrillation: Secondary | ICD-10-CM | POA: Diagnosis not present

## 2021-09-11 DIAGNOSIS — I3131 Malignant pericardial effusion in diseases classified elsewhere: Secondary | ICD-10-CM

## 2021-09-11 NOTE — Patient Instructions (Signed)
Medication Instructions:   Your physician recommends that you continue on your current medications as directed. Please refer to the Current Medication list given to you today.  *If you need a refill on your cardiac medications before your next appointment, please call your pharmacy*   Lab Work:  None ordered  Testing/Procedures:  Your physician has requested that you have a limited echocardiogram (mid February). Echocardiography is a painless test that uses sound waves to create images of your heart. It provides your doctor with information about the size and shape of your heart and how well your hearts chambers and valves are working. This procedure takes approximately one hour. There are no restrictions for this procedure.   Follow-Up: At [facility], you and your health needs are our priority.  As part of our continuing mission to provide you with exceptional heart care, we have created designated Provider Care Teams.  These Care Teams include your primary Cardiologist (physician) and Advanced Practice Providers (APPs -  Physician Assistants and Nurse Practitioners) who all work together to provide you with the care you need, when you need it.  We recommend signing up for the patient portal called "MyChart".  Sign up information is provided on this After Visit Summary.  MyChart is used to connect with patients for Virtual Visits (Telemedicine).  Patients are able to view lab/test results, encounter notes, upcoming appointments, etc.  Non-urgent messages can be sent to your provider as well.   To learn more about what you can do with MyChart, go to [email or web address].    Your next appointment:   2 month(s) after echo  The format for your next appointment:   In Person  Provider:   Christell Faith, PA-C

## 2021-09-12 ENCOUNTER — Inpatient Hospital Stay: Payer: Medicaid Other | Attending: Internal Medicine

## 2021-09-12 ENCOUNTER — Encounter: Payer: Self-pay | Admitting: Internal Medicine

## 2021-09-12 ENCOUNTER — Inpatient Hospital Stay: Payer: Medicaid Other

## 2021-09-12 ENCOUNTER — Inpatient Hospital Stay (HOSPITAL_BASED_OUTPATIENT_CLINIC_OR_DEPARTMENT_OTHER): Payer: Medicaid Other | Admitting: Internal Medicine

## 2021-09-12 VITALS — BP 101/68 | HR 72 | Temp 96.5°F | Ht 61.5 in | Wt 143.2 lb

## 2021-09-12 DIAGNOSIS — C3411 Malignant neoplasm of upper lobe, right bronchus or lung: Secondary | ICD-10-CM | POA: Insufficient documentation

## 2021-09-12 DIAGNOSIS — I3131 Malignant pericardial effusion in diseases classified elsewhere: Secondary | ICD-10-CM | POA: Insufficient documentation

## 2021-09-12 DIAGNOSIS — C7931 Secondary malignant neoplasm of brain: Secondary | ICD-10-CM

## 2021-09-12 DIAGNOSIS — I480 Paroxysmal atrial fibrillation: Secondary | ICD-10-CM | POA: Diagnosis not present

## 2021-09-12 DIAGNOSIS — Z5111 Encounter for antineoplastic chemotherapy: Secondary | ICD-10-CM | POA: Diagnosis present

## 2021-09-12 DIAGNOSIS — Z5112 Encounter for antineoplastic immunotherapy: Secondary | ICD-10-CM | POA: Diagnosis not present

## 2021-09-12 DIAGNOSIS — J449 Chronic obstructive pulmonary disease, unspecified: Secondary | ICD-10-CM | POA: Insufficient documentation

## 2021-09-12 LAB — CBC WITH DIFFERENTIAL/PLATELET
Abs Immature Granulocytes: 0.12 10*3/uL — ABNORMAL HIGH (ref 0.00–0.07)
Basophils Absolute: 0 10*3/uL (ref 0.0–0.1)
Basophils Relative: 0 %
Eosinophils Absolute: 0.2 10*3/uL (ref 0.0–0.5)
Eosinophils Relative: 2 %
HCT: 40.2 % (ref 36.0–46.0)
Hemoglobin: 13.5 g/dL (ref 12.0–15.0)
Immature Granulocytes: 1 %
Lymphocytes Relative: 24 %
Lymphs Abs: 2.7 10*3/uL (ref 0.7–4.0)
MCH: 33.2 pg (ref 26.0–34.0)
MCHC: 33.6 g/dL (ref 30.0–36.0)
MCV: 98.8 fL (ref 80.0–100.0)
Monocytes Absolute: 0.9 10*3/uL (ref 0.1–1.0)
Monocytes Relative: 8 %
Neutro Abs: 7.4 10*3/uL (ref 1.7–7.7)
Neutrophils Relative %: 65 %
Platelets: 177 10*3/uL (ref 150–400)
RBC: 4.07 MIL/uL (ref 3.87–5.11)
RDW: 14.7 % (ref 11.5–15.5)
WBC: 11.3 10*3/uL — ABNORMAL HIGH (ref 4.0–10.5)
nRBC: 0 % (ref 0.0–0.2)

## 2021-09-12 LAB — COMPREHENSIVE METABOLIC PANEL
ALT: 42 U/L (ref 0–44)
AST: 43 U/L — ABNORMAL HIGH (ref 15–41)
Albumin: 3.6 g/dL (ref 3.5–5.0)
Alkaline Phosphatase: 76 U/L (ref 38–126)
Anion gap: 6 (ref 5–15)
BUN: 18 mg/dL (ref 6–20)
CO2: 27 mmol/L (ref 22–32)
Calcium: 9 mg/dL (ref 8.9–10.3)
Chloride: 103 mmol/L (ref 98–111)
Creatinine, Ser: 0.76 mg/dL (ref 0.44–1.00)
GFR, Estimated: >60 mL/min (ref >60)
Glucose, Bld: 125 mg/dL — ABNORMAL HIGH (ref 70–99)
Potassium: 3.8 mmol/L (ref 3.5–5.1)
Sodium: 136 mmol/L (ref 135–145)
Total Bilirubin: <0.1 mg/dL — ABNORMAL LOW (ref 0.3–1.2)
Total Protein: 7.3 g/dL (ref 6.5–8.1)

## 2021-09-12 MED ORDER — SODIUM CHLORIDE 0.9 % IV SOLN
500.0000 mg/m2 | Freq: Once | INTRAVENOUS | Status: AC
  Administered 2021-09-12: 800 mg via INTRAVENOUS
  Filled 2021-09-12: qty 20

## 2021-09-12 MED ORDER — SODIUM CHLORIDE 0.9 % IV SOLN
200.0000 mg | Freq: Once | INTRAVENOUS | Status: AC
  Administered 2021-09-12: 200 mg via INTRAVENOUS
  Filled 2021-09-12: qty 8

## 2021-09-12 MED ORDER — HEPARIN SOD (PORK) LOCK FLUSH 100 UNIT/ML IV SOLN
500.0000 [IU] | Freq: Once | INTRAVENOUS | Status: AC | PRN
  Administered 2021-09-12: 500 [IU]
  Filled 2021-09-12: qty 5

## 2021-09-12 MED ORDER — PROCHLORPERAZINE MALEATE 10 MG PO TABS
10.0000 mg | ORAL_TABLET | Freq: Once | ORAL | Status: AC
  Administered 2021-09-12: 10 mg via ORAL
  Filled 2021-09-12: qty 1

## 2021-09-12 MED ORDER — SODIUM CHLORIDE 0.9 % IV SOLN
Freq: Once | INTRAVENOUS | Status: AC
  Filled 2021-09-12: qty 250

## 2021-09-12 NOTE — Progress Notes (Signed)
Pt having some aches in her legs at night. She would like to know what stage of her treatment she's at.

## 2021-09-12 NOTE — Progress Notes (Signed)
[facility] CONSULT NOTE  Patient Care Team: Practice, [facility] (Inactive) as PCP - General End, Harrell Gave, MD as PCP - Cardiology (Cardiology) Telford Nab, RN as Oncology Nurse Navigator  CHIEF COMPLAINTS/PURPOSE OF CONSULTATION: Lung cancer  #  Oncology History Overview Note  # AUG 2022-stage IV adenocarcinoma of the lung [s/p pericardial effusion/tamponade]. MRI Brain AUG 16th, 2022- IMPRESSION: Multiple peripherally enhancing, centrally T1 hypointense lesions, with surrounding T2 hyperintense signal in the bilateral cerebral hemispheres, concerning for metastatic disease.    # AUG 17th 2022- CARBO-ALIMTA-Keytruda #1   # Repeat MRI brain- 11/22 Mildly increased size of a left parietal metastasis with increased, mild edema. Decreased size of 2 other metastases. S/p SRS- finished on DEC 23rd, 2022-   #Acute respiratory failure BiPAP/ICU.  S/p pericardiocentesis.  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  [place] 61 y.o.  female  stage IV adenocarcinoma the lung cancer is here currently on chemotherapy-immunotherapy is here for a follow up.  In the interim patient has been evaluated by radiation oncology-s/p SRS to the brain lesion.   Also s/p evaluation with cardiology.  Denies any worsening shortness of breath or cough.  Complains of  intermittent memory issues.  Review of Systems  Constitutional:  Positive for malaise/fatigue. Negative for chills, diaphoresis and fever.  HENT:  Negative for nosebleeds and sore throat.   Eyes:  Negative for double vision.  Respiratory:  Positive for shortness of breath. Negative for cough, hemoptysis, sputum production and wheezing.   Cardiovascular:  Negative for chest pain, palpitations, orthopnea and leg swelling.  Gastrointestinal:  Negative for abdominal pain, blood in stool, constipation, diarrhea, heartburn, melena, nausea and vomiting.  Genitourinary:  Negative for dysuria, frequency and urgency.  Musculoskeletal:  Positive for myalgias. Negative for back pain and joint pain.  Skin: Negative.  Negative for itching and rash.  Neurological:  Negative for dizziness, tingling, focal weakness, weakness and headaches.  Endo/Heme/Allergies:  Does not bruise/bleed easily.  Psychiatric/Behavioral:  Negative for depression. The patient is not nervous/anxious and does not have insomnia.     MEDICAL HISTORY:  Past Medical History:  Diagnosis Date   Graves disease    Malignant pericardial effusion    Paroxysmal atrial fibrillation ([place])    Primary lung adenocarcinoma ([place])     SURGICAL HISTORY: Past Surgical History:  Procedure Laterality Date   IR CV LINE INJECTION  06/19/2021   IR IMAGING GUIDED PORT INSERTION  04/27/2021   PERICARDIOCENTESIS N/A 03/27/2021   Procedure: PERICARDIOCENTESIS;  Surgeon: Nelva Bush, MD;  Location: [facility];  Service: Cardiovascular;  Laterality: N/A;   TUBAL LIGATION      SOCIAL HISTORY: Social History   Socioeconomic History   Marital status: Widowed    Spouse name: Not on file   Number of children: Not on file   Years of  education: Not on file   Highest education level: Not on file  Occupational History   Not on file  Tobacco Use   Smoking status: Former    Packs/day: 1.00    Years: 40.00    Pack years: 40.00    Types:  Cigarettes   Smokeless tobacco: Never  Vaping Use   Vaping Use: Never used  Substance and Sexual Activity   Alcohol use: No   Drug use: Not Currently    Types: Marijuana   Sexual activity: Not on file  Other Topics Concern   Not on file  Social History Narrative   Not on file   Social Determinants of Health   Financial Resource Strain: High Risk   Difficulty of Paying Living Expenses: Very hard  Food Insecurity: Not on file  Transportation Needs: Not on file  Physical Activity: Not on file  Stress: Stress Concern Present   Feeling of Stress : Very much  Social Connections: Not on file  Intimate Partner Violence: Not on file    FAMILY HISTORY: Family History  Problem Relation Age of Onset   Peripheral Artery Disease Mother     ALLERGIES:  is allergic to diazepam, metoprolol, and tapazole [methimazole].  MEDICATIONS:  Current Outpatient Medications  Medication Sig Dispense Refill   bismuth subsalicylate (PEPTO BISMOL) 262 MG/15ML suspension Take 30 mLs by mouth every 6 (six) hours as needed.     diltiazem (DILACOR XR) 240 MG 24 hr capsule Take 1 capsule (240 mg total) by mouth once daily. 90 capsule 3   folic acid (FOLVITE) 1 MG tablet Take 1 tablet (1 mg total) by mouth once daily. 90 tablet 1   lidocaine-prilocaine (EMLA) cream Apply 1 application topically daily as needed. 30 g 0   Multiple Vitamin (MULTI-VITAMINS) TABS Take 1 tablet by mouth daily.     albuterol (VENTOLIN HFA) 108 (90 Base) MCG/ACT inhaler Inhale 2 puffs into the lungs every 6 (six) hours as needed for wheezing or shortness of breath. (Patient not taking: Reported on 07/09/2021) 8.5 g 0   dexamethasone (DECADRON) 2 MG tablet Take 2 tablets (4 mg total) by mouth 2 (two) times daily. Starting 08/28/21:  Take 23m BID x3 days, 468mdaily x3 days, 90m48maily x3 days. (Patient not taking: Reported on 09/11/2021) 21 tablet 0   lansoprazole (PREVACID) 30 MG capsule Take 1 capsule (30 mg total) by mouth daily at 12  noon. Take while taking dexamethasone (Patient not taking: Reported on 09/11/2021) 10 capsule 0   nystatin (MYCOSTATIN) 100000 UNIT/ML suspension Swish and spit 5 mLs (500,000 Units total) by mouth once every 8 (eight) hours as needed. (Patient not taking: Reported on 07/09/2021) 150 mL 0   ondansetron (ZOFRAN) 4 MG tablet Take 2 tablets (8mg590mtal) by mouth once every 8 hours as needed for nausea and vomiting. (Patient not taking: Reported on 07/09/2021) 80 tablet 1   prochlorperazine (COMPAZINE) 10 MG tablet Take 1 tablet (10 mg total) by mouth once every 6 (six) hours as needed for nausea or vomiting. (Patient not taking: Reported on 07/09/2021) 40 tablet 1   No current facility-administered medications for this visit.   Facility-Administered Medications Ordered in Other Visits  Medication Dose Route Frequency Provider Last Rate Last Admin   0.9 %  sodium chloride infusion   Intravenous Once BrahCharlaine DaltonMD       heparin lock flush 100 unit/mL  500 Units Intracatheter Once PRN BrahCammie Sickle  pembrolizumab (KEYTRUDA) 200 mg in sodium chloride 0.9 % 50 mL chemo infusion  200 mg Intravenous Once Charlaine Dalton R, MD       PEMEtrexed (ALIMTA) 800 mg in sodium chloride 0.9 % 100 mL chemo infusion  500 mg/m2 (Treatment Plan Recorded) Intravenous Once Cammie Sickle, MD          .  PHYSICAL EXAMINATION: ECOG PERFORMANCE STATUS: 1 - Symptomatic but completely ambulatory  Vitals:   09/12/21 0827  BP: 101/68  Pulse: 72  Temp: (!) 96.5 F (35.8 C)  SpO2: 99%   Filed Weights   09/12/21 0827  Weight: 143 lb 3.2 oz (65 kg)    Physical Exam Vitals and nursing note reviewed.  HENT:     Head: Normocephalic and atraumatic.     Mouth/Throat:     Pharynx: Oropharynx is clear.  Eyes:     Extraocular Movements: Extraocular movements intact.     Pupils: Pupils are equal, round, and reactive to light.  Cardiovascular:     Rate and Rhythm: Normal rate and  regular rhythm.  Pulmonary:     Comments: Decreased breath sounds bilaterally.  Abdominal:     Palpations: Abdomen is soft.  Musculoskeletal:        General: Normal range of motion.     Cervical back: Normal range of motion.  Skin:    General: Skin is warm.  Neurological:     General: No focal deficit present.     Mental Status: She is alert and oriented to person, place, and time.  Psychiatric:        Behavior: Behavior normal.        Judgment: Judgment normal.     LABORATORY DATA:  I have reviewed the data as listed Lab Results  Component Value Date   WBC 11.3 (H) 09/12/2021   HGB 13.5 09/12/2021   HCT 40.2 09/12/2021   MCV 98.8 09/12/2021   PLT 177 09/12/2021   Recent Labs    03/27/21 1635 03/28/21 0131 07/25/21 0808 08/15/21 0817 09/12/21 0754  NA  --    < > 137 139 136  K  --    < > 3.7 3.8 3.8  CL  --    < > 102 102 103  CO2  --    < > _0 GLUCOSE  --    < > 164* 147* 125*  BUN  --    < > _1 CREATININE  --    < > 0.62 0.65 0.76  CALCIUM  --    < > 9.5 9.5 9.0  GFRNONAA  --    < > >60 >60 >60  PROT 7.7   < > 8.4* 8.0 7.3  ALBUMIN 3.2*   < > 4.0 3.7 3.6  AST 31   < > 54* 59* 43*  ALT 21   < > 42 41 42  ALKPHOS 74   < > 83 82 76  BILITOT 1.2   < > 0.3 0.3 <0.1*  BILIDIR 0.4*  --   --   --   --   IBILI 0.8  --   --   --   --    < > = values in this interval not displayed.    RADIOGRAPHIC STUDIES: I have personally reviewed the radiological images as listed and agreed with the findings in the report. MR Brain W Wo Contrast  Result Date: 08/23/2021 CLINICAL DATA:  Brain/CNS neoplasm, staging.  Brain metastasis.  EXAM: MRI HEAD WITHOUT AND WITH CONTRAST TECHNIQUE: Multiplanar, multiecho pulse sequences of the brain and surrounding structures were obtained without and with intravenous contrast. CONTRAST:  56m GADAVIST GADOBUTROL 1 MMOL/ML IV SOLN COMPARISON:  MRI of the brain July 30, 2021. FINDINGS: Brain: No acute infarction, hemorrhage,  hydrocephalus or extra-axial collection. Three foci of abnormal contrast enhancement, concerning for metastases are seen. The largest lesion in the left parietal lobe appear necrotic with peripheral contrast enhancement and show continued interval growth with progression of the surrounding vasogenic edema. This lesion measures 22 mm in its longest oblique axis (series 13, image 115) compared to 16 mm on prior MRI (prior series 20, image 14). A second enhancing lesion is identified in the left frontal lobe, measuring 2.6 mm (series 1001, image 44), compared to 1.2 mm on prior. A third punctate enhancing lesion is identified in the current study (series 1001, image 35), new from prior. Vascular: Normal flow voids. Skull and upper cervical spine: Normal marrow signal. Sinuses/Orbits: Mucosal thickening the bilateral maxillary sinus, right sphenoid and left ethmoid. The orbits are maintained. Other: Bilateral mastoid effusion. IMPRESSION: Findings concerning for disease progression with increasing size of a left parietal and left frontal enhancing lesions with a new punctate left frontal lesion, as described above. Interval increase of the surrounding vasogenic edema associated with the left parietal lesion. Electronically Signed   By: KPedro EarlsM.D.   On: 08/23/2021 11:12    ASSESSMENT & PLAN:   Cancer of upper lobe of right lung ([place] #Stage IV-lung cancer-adenocarcinoma. AUG 2022- MRI brain-subcentimeter brain lesions [asymptomatic-see below];  S/p carbo Alimta Keytruda cycle #4. OCT, 24th 2022-partial response noted with improvement of the right upper lobe lesion; and also improvement of the lymphangitic carcinomatosis; along with decrease in size of index left supraclavicular and mediastinal lymph nodes- STABLE.     # Continue with Alimta Keytruda maintenance Labs today reviewed;  acceptable for treatment today.  We will repeat CT scan in late January 2023; order scan today.  #  Subcentimeter brain lesions-clinically asymptomatic. Repeat MRI brain- 11/22 Mildly increased size of a left parietal metastasis with increased, mild edema. Decreased size of 2 other metastases. S/p SRS- finished on DEC 23rd, 2022- await repeat MRI in end of Feb 2023.    # COPD stable-significant provement s/p/bronchodilators/diuretics- STABLE  #A. Fib-amiodarone/Cardizem as per cardiology-  Not on anti-coagulation; S/p Cards eval in Jan 2023- STABLE.   # COVID VACCINATION: Un-vaccinated; DECLINES/declined- EVUSHELD.   # Port malfunction: STABLE.   # DISPOSITION: # chemo today; # MRI brain in 6 weeks # follow up in 3 weeks;  MD; labs- cbc/cmp;; Alimta-keytruda; prior- CT CAP- Dr.B     All questions were answered. The patient knows to call the clinic with any problems, questions or concerns.    GCammie Sickle MD 09/12/2021 9:14 AM

## 2021-09-12 NOTE — Assessment & Plan Note (Signed)
#  Stage IV-lung cancer-adenocarcinoma. AUG 2022- MRI brain-subcentimeter brain lesions [asymptomatic-see below];  S/p carbo Alimta Keytruda cycle #4. OCT, 24th 2022-partial response noted with improvement of the right upper lobe lesion; and also improvement of the lymphangitic carcinomatosis; along with decrease in size of index left supraclavicular and mediastinal lymph nodes- STABLE.     # Continue with Alimta Keytruda maintenance Labs today reviewed;  acceptable for treatment today.  We will repeat CT scan in late January 2023; order scan today.  # Subcentimeter brain lesions-clinically asymptomatic. Repeat MRI brain- 11/22 Mildly increased size of a left parietal metastasis with increased, mild edema. Decreased size of 2 other metastases. S/p SRS- finished on DEC 23rd, 2022- await repeat MRI in end of Feb 2023.   # COPD stable-significant provement s/p/bronchodilators/diuretics- STABLE  #A. Fib-amiodarone/Cardizem as per cardiology-  Not on anti-coagulation; S/p Cards eval in Jan 2023- STABLE.   # COVID VACCINATION: Un-vaccinated; DECLINES/declined- EVUSHELD.   # Port malfunction: STABLE.   # DISPOSITION: # chemo today; # MRI brain in 6 weeks # follow up in 3 weeks;  MD; labs- cbc/cmp;; Alimta-keytruda; prior- CT CAP- Dr.B

## 2021-09-12 NOTE — Patient Instructions (Signed)
[facility] AT [place]  Discharge Instructions: Thank you for choosing [facility] to provide your oncology and hematology care.  If you have a lab appointment with the [facility], please go directly to the [facility] and check in at the registration area.  Wear comfortable clothing and clothing appropriate for easy access to any Portacath or PICC line.   We strive to give you quality time with your provider. You may need to reschedule your appointment if you arrive late (15 or more minutes).  Arriving late affects you and other patients whose appointments are after yours.  Also, if you miss three or more appointments without notifying the office, you may be dismissed from the clinic at the providers discretion.      For prescription refill requests, have your pharmacy contact our office and allow 72 hours for refills to be completed.    Today you received the following chemotherapy and/or immunotherapy agents KEYTRUDA and ALIMTA      To help prevent nausea and vomiting after your treatment, we encourage you to take your nausea medication as directed.  BELOW ARE SYMPTOMS THAT SHOULD BE REPORTED IMMEDIATELY: *FEVER GREATER THAN 100.4 F (38 C) OR HIGHER *CHILLS OR SWEATING *NAUSEA AND VOMITING THAT IS NOT CONTROLLED WITH YOUR NAUSEA MEDICATION *UNUSUAL SHORTNESS OF BREATH *UNUSUAL BRUISING OR BLEEDING *URINARY PROBLEMS (pain or burning when urinating, or frequent urination) *BOWEL PROBLEMS (unusual diarrhea, constipation, pain near the anus) TENDERNESS IN MOUTH AND THROAT WITH OR WITHOUT PRESENCE OF ULCERS (sore throat, sores in mouth, or a toothache) UNUSUAL RASH, SWELLING OR PAIN  UNUSUAL VAGINAL DISCHARGE OR ITCHING   Items with * indicate a potential emergency and should be followed up as soon as possible or go to the Emergency Department if any problems should occur.  Please show the CHEMOTHERAPY ALERT CARD or IMMUNOTHERAPY ALERT CARD at  check-in to the Emergency Department and triage nurse.  Should you have questions after your visit or need to cancel or reschedule your appointment, please contact [facility] [place] [facility] [place]  [phone] and follow the prompts.  Office hours are 8:00 a.m. to 4:30 p.m. Monday - Friday. Please note that voicemails left after 4:00 p.m. may not be returned until the following business day.  We are closed weekends and major holidays. You have access to a nurse at all times for urgent questions. Please call the main number to the clinic [phone] and follow the prompts.  For any non-urgent questions, you may also contact your provider using MyChart. We now offer e-Visits for anyone 45 and older to request care online for non-urgent symptoms. For details visit [email or web address].   Also download the MyChart app! Go to the app store, search "MyChart", open the app, select [facility], and log in with your MyChart username and password.  Due to Covid, a mask is required upon entering the hospital/clinic. If you do not have a mask, one will be given to you upon arrival. For doctor visits, patients may have 1 support person aged 33 or older with them. For treatment visits, patients cannot have anyone with them due to current Covid guidelines and our immunocompromised population.   Pembrolizumab injection What is this medication? PEMBROLIZUMAB (pem broe liz ue mab) is a monoclonal antibody. It is used to treat certain types of cancer. This medicine may be used for other purposes; ask your health care provider or pharmacist if you have questions. COMMON BRAND NAME(S): Keytruda What should I tell my  care team before I take this medication? They need to know if you have any of these conditions: autoimmune diseases like Crohn's disease, ulcerative colitis, or lupus have had or planning to have an allogeneic stem cell transplant (uses someone else's stem cells) history of organ  transplant history of chest radiation nervous system problems like myasthenia gravis or Guillain-Barre syndrome an unusual or allergic reaction to pembrolizumab, other medicines, foods, dyes, or preservatives pregnant or trying to get pregnant breast-feeding How should I use this medication? This medicine is for infusion into a vein. It is given by a health care professional in a hospital or clinic setting. A special MedGuide will be given to you before each treatment. Be sure to read this information carefully each time. Talk to your pediatrician regarding the use of this medicine in children. While this drug may be prescribed for children as young as 6 months for selected conditions, precautions do apply. Overdosage: If you think you have taken too much of this medicine contact a poison control center or emergency room at once. NOTE: This medicine is only for you. Do not share this medicine with others. What if I miss a dose? It is important not to miss your dose. Call your doctor or health care professional if you are unable to keep an appointment. What may interact with this medication? Interactions have not been studied. This list may not describe all possible interactions. Give your health care provider a list of all the medicines, herbs, non-prescription drugs, or dietary supplements you use. Also tell them if you smoke, drink alcohol, or use illegal drugs. Some items may interact with your medicine. What should I watch for while using this medication? Your condition will be monitored carefully while you are receiving this medicine. You may need blood work done while you are taking this medicine. Do not become pregnant while taking this medicine or for 4 months after stopping it. Women should inform their doctor if they wish to become pregnant or think they might be pregnant. There is a potential for serious side effects to an unborn child. Talk to your health care professional or  pharmacist for more information. Do not breast-feed an infant while taking this medicine or for 4 months after the last dose. What side effects may I notice from receiving this medication? Side effects that you should report to your doctor or health care professional as soon as possible: allergic reactions like skin rash, itching or hives, swelling of the face, lips, or tongue bloody or black, tarry breathing problems changes in vision chest pain chills confusion constipation cough diarrhea dizziness or feeling faint or lightheaded fast or irregular heartbeat fever flushing joint pain low blood counts - this medicine may decrease the number of white blood cells, red blood cells and platelets. You may be at increased risk for infections and bleeding. muscle pain muscle weakness pain, tingling, numbness in the hands or feet persistent headache redness, blistering, peeling or loosening of the skin, including inside the mouth signs and symptoms of high blood sugar such as dizziness; dry mouth; dry skin; fruity breath; nausea; stomach pain; increased hunger or thirst; increased urination signs and symptoms of kidney injury like trouble passing urine or change in the amount of urine signs and symptoms of liver injury like dark urine, light-colored stools, loss of appetite, nausea, right upper belly pain, yellowing of the eyes or skin sweating swollen lymph nodes weight loss Side effects that usually do not require medical attention (report to your  doctor or health care professional if they continue or are bothersome): decreased appetite hair loss tiredness This list may not describe all possible side effects. Call your doctor for medical advice about side effects. You may report side effects to FDA at 1-800-FDA-1088. Where should I keep my medication? This drug is given in a hospital or clinic and will not be stored at home. NOTE: This sheet is a summary. It may not cover all possible  information. If you have questions about this medicine, talk to your doctor, pharmacist, or health care provider.  2022 Elsevier/Gold Standard (2021-05-15 00:00:00)  Pemetrexed injection What is this medication? PEMETREXED (PEM e TREX ed) is a chemotherapy drug used to treat lung cancers like non-small cell lung cancer and mesothelioma. It may also be used to treat other cancers. This medicine may be used for other purposes; ask your health care provider or pharmacist if you have questions. COMMON BRAND NAME(S): Alimta, PEMFEXY What should I tell my care team before I take this medication? They need to know if you have any of these conditions: infection (especially a virus infection such as chickenpox, cold sores, or herpes) kidney disease low blood counts, like low white cell, platelet, or red cell counts lung or breathing disease, like asthma radiation therapy an unusual or allergic reaction to pemetrexed, other medicines, foods, dyes, or preservative pregnant or trying to get pregnant breast-feeding How should I use this medication? This drug is given as an infusion into a vein. It is administered in a hospital or clinic by a specially trained health care professional. Talk to your pediatrician regarding the use of this medicine in children. Special care may be needed. Overdosage: If you think you have taken too much of this medicine contact a poison control center or emergency room at once. NOTE: This medicine is only for you. Do not share this medicine with others. What if I miss a dose? It is important not to miss your dose. Call your doctor or health care professional if you are unable to keep an appointment. What may interact with this medication? This medicine may interact with the following medications: Ibuprofen This list may not describe all possible interactions. Give your health care provider a list of all the medicines, herbs, non-prescription drugs, or dietary supplements  you use. Also tell them if you smoke, drink alcohol, or use illegal drugs. Some items may interact with your medicine. What should I watch for while using this medication? Visit your doctor for checks on your progress. This drug may make you feel generally unwell. This is not uncommon, as chemotherapy can affect healthy cells as well as cancer cells. Report any side effects. Continue your course of treatment even though you feel ill unless your doctor tells you to stop. In some cases, you may be given additional medicines to help with side effects. Follow all directions for their use. Call your doctor or health care professional for advice if you get a fever, chills or sore throat, or other symptoms of a cold or flu. Do not treat yourself. This drug decreases your body's ability to fight infections. Try to avoid being around people who are sick. This medicine may increase your risk to bruise or bleed. Call your doctor or health care professional if you notice any unusual bleeding. Be careful brushing and flossing your teeth or using a toothpick because you may get an infection or bleed more easily. If you have any dental work done, tell your dentist you are receiving  this medicine. Avoid taking products that contain aspirin, acetaminophen, ibuprofen, naproxen, or ketoprofen unless instructed by your doctor. These medicines may hide a fever. Call your doctor or health care professional if you get diarrhea or mouth sores. Do not treat yourself. To protect your kidneys, drink water or other fluids as directed while you are taking this medicine. Do not become pregnant while taking this medicine or for 6 months after stopping it. Women should inform their doctor if they wish to become pregnant or think they might be pregnant. Men should not father a child while taking this medicine and for 3 months after stopping it. This may interfere with the ability to father a child. You should talk to your doctor or health  care professional if you are concerned about your fertility. There is a potential for serious side effects to an unborn child. Talk to your health care professional or pharmacist for more information. Do not breast-feed an infant while taking this medicine or for 1 week after stopping it. What side effects may I notice from receiving this medication? Side effects that you should report to your doctor or health care professional as soon as possible: allergic reactions like skin rash, itching or hives, swelling of the face, lips, or tongue breathing problems redness, blistering, peeling or loosening of the skin, including inside the mouth signs and symptoms of bleeding such as bloody or black, tarry stools; red or dark-brown urine; spitting up blood or brown material that looks like coffee grounds; red spots on the skin; unusual bruising or bleeding from the eye, gums, or nose signs and symptoms of infection like fever or chills; cough; sore throat; pain or trouble passing urine signs and symptoms of kidney injury like trouble passing urine or change in the amount of urine signs and symptoms of liver injury like dark yellow or brown urine; general ill feeling or flu-like symptoms; light-colored stools; loss of appetite; nausea; right upper belly pain; unusually weak or tired; yellowing of the eyes or skin Side effects that usually do not require medical attention (report to your doctor or health care professional if they continue or are bothersome): constipation mouth sores nausea, vomiting unusually weak or tired This list may not describe all possible side effects. Call your doctor for medical advice about side effects. You may report side effects to FDA at 1-800-FDA-1088. Where should I keep my medication? This drug is given in a hospital or clinic and will not be stored at home. NOTE: This sheet is a summary. It may not cover all possible information. If you have questions about this medicine,  talk to your doctor, pharmacist, or health care provider.  2022 Elsevier/Gold Standard (2017-10-21 00:00:00)

## 2021-10-01 ENCOUNTER — Ambulatory Visit
Admission: RE | Admit: 2021-10-01 | Discharge: 2021-10-01 | Disposition: A | Discharge status: Home / Self Care | Payer: Medicaid Other | Source: Ambulatory Visit | Attending: Radiation Oncology | Admitting: Radiation Oncology

## 2021-10-01 ENCOUNTER — Other Ambulatory Visit: Payer: Self-pay

## 2021-10-01 ENCOUNTER — Ambulatory Visit
Admission: RE | Admit: 2021-10-01 | Discharge: 2021-10-01 | Disposition: A | Discharge status: Home / Self Care | Payer: Medicaid Other | Source: Ambulatory Visit | Attending: Internal Medicine | Admitting: Internal Medicine

## 2021-10-01 VITALS — BP 125/86 | HR 87 | Temp 95.8°F | Resp 18 | Wt 142.1 lb

## 2021-10-01 DIAGNOSIS — Z923 Personal history of irradiation: Secondary | ICD-10-CM | POA: Insufficient documentation

## 2021-10-01 DIAGNOSIS — I3139 Other pericardial effusion (noninflammatory): Secondary | ICD-10-CM | POA: Insufficient documentation

## 2021-10-01 DIAGNOSIS — C3411 Malignant neoplasm of upper lobe, right bronchus or lung: Secondary | ICD-10-CM | POA: Diagnosis present

## 2021-10-01 DIAGNOSIS — C7931 Secondary malignant neoplasm of brain: Secondary | ICD-10-CM | POA: Insufficient documentation

## 2021-10-01 MED ORDER — IOHEXOL 300 MG/ML  SOLN
100.0000 mL | Freq: Once | INTRAMUSCULAR | Status: AC | PRN
  Administered 2021-10-01: 100 mL via INTRAVENOUS

## 2021-10-01 NOTE — Progress Notes (Signed)
Radiation Oncology Follow up Note  Name: Nichole Park   Date:   10/01/2021 MRN:  [id number] DOB: 1961-02-26    This 61 y.o. female presents to the clinic today for 1 month follow-up status post SRS for solitary brain metastasis and patient with known stage IV adenocarcinoma the lung.  REFERRING PROVIDER: No ref. provider found  HPI: Patient is a 61 year old female now at 1 month having completed SRS for solitary brain metastasis and patient with known stage IV adenocarcinoma of the lung.  Seen today in routine follow-up she is doing well.  She had a pericardial effusion and tamponade requiring pericardiocentesis.  She states.  She is continued on Decadron for her pulmonary disease states she feels like it is causing her to have some peripheral edema and swelling of her abdomen.  She has a CT scan of her chest and abdomen pelvis scheduled for today and as well as an MRI of her brain scheduled for the middle of February.  She specifically denies any focal neurologic deficits any change in visual fields.  She is not having headaches.  COMPLICATIONS OF TREATMENT: none  FOLLOW UP COMPLIANCE: keeps appointments   PHYSICAL EXAM:  BP 125/86    Pulse 87    Temp (!) 95.8 F (35.4 C)    Resp 18    Wt 142 lb 1.6 oz (64.5 kg)    BMI 26.41 kg/m  Motor or sensory in detail levels are equal and symmetric upper lower extremities crude visual fields are within normal range.  Well-developed well-nourished patient in NAD. HEENT reveals PERLA, EOMI, discs not visualized.  Oral cavity is clear. No oral mucosal lesions are identified. Neck is clear without evidence of cervical or supraclavicular adenopathy. Lungs are clear to A&P. Cardiac examination is essentially unremarkable with regular rate and rhythm without murmur rub or thrill. Abdomen is benign with no organomegaly or masses noted. Motor sensory and DTR levels are equal and symmetric in the upper and lower extremities. Cranial nerves II through XII are grossly  intact. Proprioception is intact. No peripheral adenopathy or edema is identified. No motor or sensory levels are noted. Crude visual fields are within normal range.  RADIOLOGY RESULTS: Scans will be reviewed when available  PLAN: Present time of asked to see her back in 3 months for follow-up.  Will have repeat CT scan reviewed when that becomes available after today as well as MRI of her brain in February.  She continues close follow-up care as well as maintenance Alimta and Keytruda under medical oncology's direction.  Patient knows to call with any concerns.  I would like to take this opportunity to thank you for allowing me to participate in the care of your patient.Noreene Filbert, MD

## 2021-10-02 ENCOUNTER — Telehealth: Payer: Self-pay | Admitting: *Deleted

## 2021-10-02 NOTE — Telephone Encounter (Signed)
Called report  IMPRESSION: 1. Increase in size of lobular centrally necrotic left axillary lymph nodes measuring up to 12 mm in short axis, a nonspecific finding but which is concerning for malignancy, and while metastatic involvement of the patient's primary lung malignancy is a differential consideration it is felt less likely, with a more probable differential consideration including occult left breast malignancy. Recommend further evaluation with mammography and dedicated ultrasound with possible FNA/biopsy. 2. No significant change in size of the right lung mass, with stable right lower lobe septal thickening and decreased size of the index mediastinal lymph nodes. 3. No evidence of metastatic disease within the abdomen or pelvis. 4. 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 5. Mild symmetric wall thickening of the esophagus with reflux versus retained contrast in the esophagus, suggestive of reflux esophagitis. 6. Aortic Atherosclerosis (ICD10-I70.0) and Emphysema (ICD10-J43.9).   These results will be called to the ordering clinician or representative by the Radiologist Assistant, and communication documented in the PACS or [employer].     Electronically Signed   By: Dahlia Bailiff M.D.   On: 10/02/2021 12:42

## 2021-10-03 ENCOUNTER — Other Ambulatory Visit: Payer: Self-pay

## 2021-10-03 ENCOUNTER — Encounter: Payer: Self-pay | Admitting: Internal Medicine

## 2021-10-03 ENCOUNTER — Inpatient Hospital Stay: Payer: Medicaid Other

## 2021-10-03 ENCOUNTER — Inpatient Hospital Stay (HOSPITAL_BASED_OUTPATIENT_CLINIC_OR_DEPARTMENT_OTHER): Payer: Medicaid Other | Admitting: Internal Medicine

## 2021-10-03 VITALS — BP 109/78 | HR 95 | Temp 96.8°F | Resp 18 | Wt 142.6 lb

## 2021-10-03 DIAGNOSIS — C3411 Malignant neoplasm of upper lobe, right bronchus or lung: Secondary | ICD-10-CM

## 2021-10-03 DIAGNOSIS — I3131 Malignant pericardial effusion in diseases classified elsewhere: Secondary | ICD-10-CM

## 2021-10-03 DIAGNOSIS — Z5112 Encounter for antineoplastic immunotherapy: Secondary | ICD-10-CM | POA: Diagnosis not present

## 2021-10-03 LAB — CBC WITH DIFFERENTIAL/PLATELET
Abs Immature Granulocytes: 0.18 10*3/uL — ABNORMAL HIGH (ref 0.00–0.07)
Basophils Absolute: 0.1 10*3/uL (ref 0.0–0.1)
Basophils Relative: 1 %
Eosinophils Absolute: 0.1 10*3/uL (ref 0.0–0.5)
Eosinophils Relative: 1 %
HCT: 40 % (ref 36.0–46.0)
Hemoglobin: 13.4 g/dL (ref 12.0–15.0)
Immature Granulocytes: 1 %
Lymphocytes Relative: 27 %
Lymphs Abs: 3.6 10*3/uL (ref 0.7–4.0)
MCH: 33.3 pg (ref 26.0–34.0)
MCHC: 33.5 g/dL (ref 30.0–36.0)
MCV: 99.5 fL (ref 80.0–100.0)
Monocytes Absolute: 1 10*3/uL (ref 0.1–1.0)
Monocytes Relative: 8 %
Neutro Abs: 8.4 10*3/uL — ABNORMAL HIGH (ref 1.7–7.7)
Neutrophils Relative %: 62 %
Platelets: 351 10*3/uL (ref 150–400)
RBC: 4.02 MIL/uL (ref 3.87–5.11)
RDW: 14.8 % (ref 11.5–15.5)
WBC: 13.4 10*3/uL — ABNORMAL HIGH (ref 4.0–10.5)
nRBC: 0 % (ref 0.0–0.2)

## 2021-10-03 LAB — COMPREHENSIVE METABOLIC PANEL
ALT: 46 U/L — ABNORMAL HIGH (ref 0–44)
AST: 61 U/L — ABNORMAL HIGH (ref 15–41)
Albumin: 3.8 g/dL (ref 3.5–5.0)
Alkaline Phosphatase: 84 U/L (ref 38–126)
Anion gap: 11 (ref 5–15)
BUN: 11 mg/dL (ref 6–20)
CO2: 26 mmol/L (ref 22–32)
Calcium: 9.4 mg/dL (ref 8.9–10.3)
Chloride: 100 mmol/L (ref 98–111)
Creatinine, Ser: 0.74 mg/dL (ref 0.44–1.00)
GFR, Estimated: >60 mL/min (ref >60)
Glucose, Bld: 132 mg/dL — ABNORMAL HIGH (ref 70–99)
Potassium: 3.7 mmol/L (ref 3.5–5.1)
Sodium: 137 mmol/L (ref 135–145)
Total Bilirubin: 0.5 mg/dL (ref 0.3–1.2)
Total Protein: 7.7 g/dL (ref 6.5–8.1)

## 2021-10-03 LAB — TSH: TSH: 86.649 u[IU]/mL — ABNORMAL HIGH (ref 0.350–4.500)

## 2021-10-03 MED ORDER — SODIUM CHLORIDE 0.9 % IV SOLN
200.0000 mg | Freq: Once | INTRAVENOUS | Status: AC
  Administered 2021-10-03: 10:00:00 200 mg via INTRAVENOUS
  Filled 2021-10-03: qty 200

## 2021-10-03 MED ORDER — SODIUM CHLORIDE 0.9 % IV SOLN
500.0000 mg/m2 | Freq: Once | INTRAVENOUS | Status: AC
  Administered 2021-10-03: 11:00:00 800 mg via INTRAVENOUS
  Filled 2021-10-03: qty 20

## 2021-10-03 MED ORDER — SODIUM CHLORIDE 0.9% FLUSH
10.0000 mL | INTRAVENOUS | Status: DC | PRN
  Administered 2021-10-03: 08:00:00 10 mL via INTRAVENOUS
  Filled 2021-10-03: qty 10

## 2021-10-03 MED ORDER — HEPARIN SOD (PORK) LOCK FLUSH 100 UNIT/ML IV SOLN
500.0000 [IU] | Freq: Once | INTRAVENOUS | Status: DC
  Filled 2021-10-03: qty 5

## 2021-10-03 MED ORDER — SODIUM CHLORIDE 0.9 % IV SOLN
Freq: Once | INTRAVENOUS | Status: AC
  Filled 2021-10-03: qty 250

## 2021-10-03 MED ORDER — PROCHLORPERAZINE MALEATE 10 MG PO TABS
10.0000 mg | ORAL_TABLET | Freq: Once | ORAL | Status: AC
  Administered 2021-10-03: 10:00:00 10 mg via ORAL
  Filled 2021-10-03: qty 1

## 2021-10-03 MED ORDER — HEPARIN SOD (PORK) LOCK FLUSH 100 UNIT/ML IV SOLN
500.0000 [IU] | Freq: Once | INTRAVENOUS | Status: AC | PRN
  Administered 2021-10-03: 12:00:00 500 [IU]
  Filled 2021-10-03: qty 5

## 2021-10-03 NOTE — Progress Notes (Signed)
Last night patient had diarrhea and vomiting that she attributes to oral contrast for CT.  A couple weeks ago patient had an episode of left chest pain that did radiate to her back.  Feeling weak as well as arm weakness since the episode.    HR 95

## 2021-10-03 NOTE — Patient Instructions (Signed)
[facility] CANCER [facility] AT [place]  Discharge Instructions: Thank you for choosing [facility] to provide your oncology and hematology care.  If you have a lab appointment with the [facility], please go directly to the [place] and check in at the registration area.  Wear comfortable clothing and clothing appropriate for easy access to any Portacath or PICC line.   We strive to give you quality time with your provider. You may need to reschedule your appointment if you arrive late (15 or more minutes).  Arriving late affects you and other patients whose appointments are after yours.  Also, if you miss three or more appointments without notifying the office, you may be dismissed from the clinic at the providers discretion.      For prescription refill requests, have your pharmacy contact our office and allow 72 hours for refills to be completed.    Today you received the following chemotherapy and/or immunotherapy agents: Alimta, Keytruda      To help prevent nausea and vomiting after your treatment, we encourage you to take your nausea medication as directed.  BELOW ARE SYMPTOMS THAT SHOULD BE REPORTED IMMEDIATELY: *FEVER GREATER THAN 100.4 F (38 C) OR HIGHER *CHILLS OR SWEATING *NAUSEA AND VOMITING THAT IS NOT CONTROLLED WITH YOUR NAUSEA MEDICATION *UNUSUAL SHORTNESS OF BREATH *UNUSUAL BRUISING OR BLEEDING *URINARY PROBLEMS (pain or burning when urinating, or frequent urination) *BOWEL PROBLEMS (unusual diarrhea, constipation, pain near the anus) TENDERNESS IN MOUTH AND THROAT WITH OR WITHOUT PRESENCE OF ULCERS (sore throat, sores in mouth, or a toothache) UNUSUAL RASH, SWELLING OR PAIN  UNUSUAL VAGINAL DISCHARGE OR ITCHING   Items with * indicate a potential emergency and should be followed up as soon as possible or go to the Emergency Department if any problems should occur.  Please show the CHEMOTHERAPY ALERT CARD or IMMUNOTHERAPY ALERT CARD at  check-in to the Emergency Department and triage nurse.  Should you have questions after your visit or need to cancel or reschedule your appointment, please contact [facility]CER [place] AT [place]  [phone] and follow the prompts.  Office hours are 8:00 a.m. to 4:30 p.m. Monday - Friday. Please note that voicemails left after 4:00 p.m. may not be returned until the following business day.  We are closed weekends and major holidays. You have access to a nurse at all times for urgent questions. Please call the main number to the clinic [phone] and follow the prompts.  For any non-urgent questions, you may also contact your provider using MyChart. We now offer e-Visits for anyone 15 and older to request care online for non-urgent symptoms. For details visit [email or web address].   Also download the MyChart app! Go to the app store, search "MyChart", open the app, select [facility], and log in with your MyChart username and password.  Due to Covid, a mask is required upon entering the hospital/clinic. If you do not have a mask, one will be given to you upon arrival. For doctor visits, patients may have 1 support person aged 13 or older with them. For treatment visits, patients cannot have anyone with them due to current Covid guidelines and our immunocompromised population. Pembrolizumab injection What is this medication? PEMBROLIZUMAB (pem broe liz ue mab) is a monoclonal antibody. It is used to treat certain types of cancer. This medicine may be used for other purposes; ask your health care provider or pharmacist if you have questions. COMMON BRAND NAME(S): Keytruda What should I tell my care team before  I take this medication? They need to know if you have any of these conditions: autoimmune diseases like Crohn's disease, ulcerative colitis, or lupus have had or planning to have an allogeneic stem cell transplant (uses someone else's stem cells) history of organ  transplant history of chest radiation nervous system problems like myasthenia gravis or Guillain-Barre syndrome an unusual or allergic reaction to pembrolizumab, other medicines, foods, dyes, or preservatives pregnant or trying to get pregnant breast-feeding How should I use this medication? This medicine is for infusion into a vein. It is given by a health care professional in a hospital or clinic setting. A special MedGuide will be given to you before each treatment. Be sure to read this information carefully each time. Talk to your pediatrician regarding the use of this medicine in children. While this drug may be prescribed for children as young as 6 months for selected conditions, precautions do apply. Overdosage: If you think you have taken too much of this medicine contact a poison control center or emergency room at once. NOTE: This medicine is only for you. Do not share this medicine with others. What if I miss a dose? It is important not to miss your dose. Call your doctor or health care professional if you are unable to keep an appointment. What may interact with this medication? Interactions have not been studied. This list may not describe all possible interactions. Give your health care provider a list of all the medicines, herbs, non-prescription drugs, or dietary supplements you use. Also tell them if you smoke, drink alcohol, or use illegal drugs. Some items may interact with your medicine. What should I watch for while using this medication? Your condition will be monitored carefully while you are receiving this medicine. You may need blood work done while you are taking this medicine. Do not become pregnant while taking this medicine or for 4 months after stopping it. Women should inform their doctor if they wish to become pregnant or think they might be pregnant. There is a potential for serious side effects to an unborn child. Talk to your health care professional or  pharmacist for more information. Do not breast-feed an infant while taking this medicine or for 4 months after the last dose. What side effects may I notice from receiving this medication? Side effects that you should report to your doctor or health care professional as soon as possible: allergic reactions like skin rash, itching or hives, swelling of the face, lips, or tongue bloody or black, tarry breathing problems changes in vision chest pain chills confusion constipation cough diarrhea dizziness or feeling faint or lightheaded fast or irregular heartbeat fever flushing joint pain low blood counts - this medicine may decrease the number of white blood cells, red blood cells and platelets. You may be at increased risk for infections and bleeding. muscle pain muscle weakness pain, tingling, numbness in the hands or feet persistent headache redness, blistering, peeling or loosening of the skin, including inside the mouth signs and symptoms of high blood sugar such as dizziness; dry mouth; dry skin; fruity breath; nausea; stomach pain; increased hunger or thirst; increased urination signs and symptoms of kidney injury like trouble passing urine or change in the amount of urine signs and symptoms of liver injury like dark urine, light-colored stools, loss of appetite, nausea, right upper belly pain, yellowing of the eyes or skin sweating swollen lymph nodes weight loss Side effects that usually do not require medical attention (report to your doctor or health  care professional if they continue or are bothersome): decreased appetite hair loss tiredness This list may not describe all possible side effects. Call your doctor for medical advice about side effects. You may report side effects to FDA at 1-800-FDA-1088. Where should I keep my medication? This drug is given in a hospital or clinic and will not be stored at home. NOTE: This sheet is a summary. It may not cover all possible  information. If you have questions about this medicine, talk to your doctor, pharmacist, or health care provider.  2022 Elsevier/Gold Standard (2021-05-15 00:00:00) Pemetrexed injection What is this medication? PEMETREXED (PEM e TREX ed) is a chemotherapy drug used to treat lung cancers like non-small cell lung cancer and mesothelioma. It may also be used to treat other cancers. This medicine may be used for other purposes; ask your health care provider or pharmacist if you have questions. COMMON BRAND NAME(S): Alimta, PEMFEXY What should I tell my care team before I take this medication? They need to know if you have any of these conditions: infection (especially a virus infection such as chickenpox, cold sores, or herpes) kidney disease low blood counts, like low white cell, platelet, or red cell counts lung or breathing disease, like asthma radiation therapy an unusual or allergic reaction to pemetrexed, other medicines, foods, dyes, or preservative pregnant or trying to get pregnant breast-feeding How should I use this medication? This drug is given as an infusion into a vein. It is administered in a hospital or clinic by a specially trained health care professional. Talk to your pediatrician regarding the use of this medicine in children. Special care may be needed. Overdosage: If you think you have taken too much of this medicine contact a poison control center or emergency room at once. NOTE: This medicine is only for you. Do not share this medicine with others. What if I miss a dose? It is important not to miss your dose. Call your doctor or health care professional if you are unable to keep an appointment. What may interact with this medication? This medicine may interact with the following medications: Ibuprofen This list may not describe all possible interactions. Give your health care provider a list of all the medicines, herbs, non-prescription drugs, or dietary supplements  you use. Also tell them if you smoke, drink alcohol, or use illegal drugs. Some items may interact with your medicine. What should I watch for while using this medication? Visit your doctor for checks on your progress. This drug may make you feel generally unwell. This is not uncommon, as chemotherapy can affect healthy cells as well as cancer cells. Report any side effects. Continue your course of treatment even though you feel ill unless your doctor tells you to stop. In some cases, you may be given additional medicines to help with side effects. Follow all directions for their use. Call your doctor or health care professional for advice if you get a fever, chills or sore throat, or other symptoms of a cold or flu. Do not treat yourself. This drug decreases your body's ability to fight infections. Try to avoid being around people who are sick. This medicine may increase your risk to bruise or bleed. Call your doctor or health care professional if you notice any unusual bleeding. Be careful brushing and flossing your teeth or using a toothpick because you may get an infection or bleed more easily. If you have any dental work done, tell your dentist you are receiving this medicine. Avoid taking  products that contain aspirin, acetaminophen, ibuprofen, naproxen, or ketoprofen unless instructed by your doctor. These medicines may hide a fever. Call your doctor or health care professional if you get diarrhea or mouth sores. Do not treat yourself. To protect your kidneys, drink water or other fluids as directed while you are taking this medicine. Do not become pregnant while taking this medicine or for 6 months after stopping it. Women should inform their doctor if they wish to become pregnant or think they might be pregnant. Men should not father a child while taking this medicine and for 3 months after stopping it. This may interfere with the ability to father a child. You should talk to your doctor or health  care professional if you are concerned about your fertility. There is a potential for serious side effects to an unborn child. Talk to your health care professional or pharmacist for more information. Do not breast-feed an infant while taking this medicine or for 1 week after stopping it. What side effects may I notice from receiving this medication? Side effects that you should report to your doctor or health care professional as soon as possible: allergic reactions like skin rash, itching or hives, swelling of the face, lips, or tongue breathing problems redness, blistering, peeling or loosening of the skin, including inside the mouth signs and symptoms of bleeding such as bloody or black, tarry stools; red or dark-brown urine; spitting up blood or brown material that looks like coffee grounds; red spots on the skin; unusual bruising or bleeding from the eye, gums, or nose signs and symptoms of infection like fever or chills; cough; sore throat; pain or trouble passing urine signs and symptoms of kidney injury like trouble passing urine or change in the amount of urine signs and symptoms of liver injury like dark yellow or brown urine; general ill feeling or flu-like symptoms; light-colored stools; loss of appetite; nausea; right upper belly pain; unusually weak or tired; yellowing of the eyes or skin Side effects that usually do not require medical attention (report to your doctor or health care professional if they continue or are bothersome): constipation mouth sores nausea, vomiting unusually weak or tired This list may not describe all possible side effects. Call your doctor for medical advice about side effects. You may report side effects to FDA at 1-800-FDA-1088. Where should I keep my medication? This drug is given in a hospital or clinic and will not be stored at home. NOTE: This sheet is a summary. It may not cover all possible information. If you have questions about this medicine,  talk to your doctor, pharmacist, or health care provider.  2022 Elsevier/Gold Standard (2017-10-21 00:00:00)

## 2021-10-03 NOTE — Assessment & Plan Note (Addendum)
#  Stage IV-lung cancer-adenocarcinoma. AUG 2022- MRI brain-subcentimeter brain lesions [asymptomatic-see below];  JAN, 23rd 2023-No significant change in size of the right lung mass, with stable right lower lobe septal thickening and decreased size of the index mediastinal lymph nodes.No evidence of metastatic disease within the abdomen or pelvis; see below.  #  Continue with Alimta Keytruda maintenance Labs today reviewed;  acceptable for treatment today.    #CT scan 2023- increase in size of lobular centrally necrotic left axillary lymph nodes ~ 12 mm- DDx: occult left breast Malignancy vs metastatic lung cancer. Recommend further evaluation with mammography and dedicated ultrasound with possible /biopsy [previous mammo- [facility]].  Mammogram ordered.  # chest pain left radiating to left arm/shoulder-suspect musculoskeletal rather than true primary cardiac pain.  Encourage patient to follow-up with cardiology.  Will inform cardiology.  # Subcentimeter brain lesions-clinically asymptomatic. Repeat MRI brain- 11/22 Mildly increased size of a left parietal metastasis with increased, mild edema. Decreased size of 2 other metastases. S/p SRS- finished on DEC 23rd, 2022- awaiting 2/15- MRI.   # COPD stable-significant provement s/p/bronchodilators/diuretics- STABLE  #A. Fib-amiodarone/Cardizem as per cardiology-  Not on anti-coagulation; S/p Cards eval in Jan 2023- STABLE.   # COVID VACCINATION: Un-vaccinated; DECLINES/declined- [place].   #Incidental findings on CT Imaging dated: JAn 18th, 2023:emphysema; atherosclerosis; diverticulosis- reviewed/discussed/counseled the patient.   # Port malfunction: STABLE.   # DISPOSITION: # bil mammogram screening- Left breast occult cancer/Left axilla LN  # chemo today; # follow up in 3 week- Thursday/Friday;   MD; labs- cbc/cmp;; Alimta-keytruda; B12 injection- Dr.B

## 2021-10-03 NOTE — Progress Notes (Signed)
[facility] NOTE  Patient Care Team: Helen as PCP - General End, Harrell Gave, MD as PCP - Cardiology (Cardiology) Telford Nab, RN as Oncology Nurse Navigator  CHIEF COMPLAINTS/PURPOSE OF CONSULTATION: Lung cancer  #  Oncology History Overview Note  # AUG 2022-stage IV adenocarcinoma of the lung [s/p pericardial effusion/tamponade]. MRI Brain AUG 16th, 2022- IMPRESSION: Multiple peripherally enhancing, centrally T1 hypointense lesions, with surrounding T2 hyperintense signal in the bilateral cerebral hemispheres, concerning for metastatic disease.    # AUG 17th 2022- CARBO-ALIMTA-Keytruda #1   # Repeat MRI brain- 11/22 Mildly increased size of a left parietal metastasis with increased, mild edema. Decreased size of 2 other metastases. S/p SRS- finished on DEC 23rd, 2022-   #Acute respiratory failure BiPAP/ICU.  S/p pericardiocentesis.  NGS; PDL-1-0    Malignant pericardial effusion  03/28/2021 Initial Diagnosis   Malignant pericardial effusion ([place])   04/25/2021 -  Chemotherapy   Patient is on Treatment Plan : LUNG CARBOplatin / Pemetrexed / Pembrolizumab q21d Induction x 4 cycles / Maintenance Pemetrexed + Pembrolizumab     Cancer of upper lobe of right lung ([place])  04/10/2021 Initial Diagnosis   Cancer of upper lobe of right lung ([place])   04/10/2021 Cancer Staging   Staging form: Lung, AJCC 8th Edition - Clinical: Stage IVA (cT2, cN2, cM1a) - Signed by Cammie Sickle, MD on 04/10/2021       HISTORY OF PRESENTING ILLNESS: Accompanied by family; patient currently in wheelchair.  [place] 61 y.o.  female  stage IV adenocarcinoma the lung cancer is here currently on chemotherapy-immunotherapy is here for a follow up/review results of CT scan.  Patient admits to episode of chest pain that radiated to the left arm/chest wall approximately 2 weeks ago which resolved after 30 minutes or so.  Patient states that she took  turmeric-which seems to help the pain.  Of note patient states that after the contrast with the CT scan noted to have diarrhea and nausea/vomiting which is currently resolved.  Patient feels tired.  She denies any current chest pain.  Denies any worsening shortness of breath or cough.  Review of Systems  Constitutional:  Positive for malaise/fatigue. Negative for chills, diaphoresis and fever.  HENT:  Negative for nosebleeds and sore throat.   Eyes:  Negative for double vision.  Respiratory:  Positive for shortness of breath. Negative for cough, hemoptysis, sputum production and wheezing.   Cardiovascular:  Negative for chest pain, palpitations, orthopnea and leg swelling.  Gastrointestinal:  Negative for abdominal pain, blood in stool, constipation, diarrhea, heartburn, melena, nausea and vomiting.  Genitourinary:  Negative for dysuria, frequency and urgency.  Musculoskeletal:  Positive for myalgias. Negative for back pain and joint pain.  Skin: Negative.  Negative for itching and rash.  Neurological:  Negative for dizziness, tingling, focal weakness, weakness and headaches.  Endo/Heme/Allergies:  Does not bruise/bleed easily.  Psychiatric/Behavioral:  Negative for depression. The patient is not nervous/anxious and does not have insomnia.     MEDICAL HISTORY:  Past Medical History:  Diagnosis Date   Graves disease    Malignant pericardial effusion    Paroxysmal atrial fibrillation ([place])    Primary lung adenocarcinoma ([place])     SURGICAL HISTORY: Past Surgical History:  Procedure Laterality Date   IR CV LINE INJECTION  06/19/2021   IR IMAGING GUIDED PORT INSERTION  04/27/2021   PERICARDIOCENTESIS N/A 03/27/2021   Procedure: PERICARDIOCENTESIS;  Surgeon: Nelva Bush, MD;  Location: [facility];  Service: Cardiovascular;  Laterality: N/A;   TUBAL LIGATION      SOCIAL HISTORY: Social History   Socioeconomic History   Marital status: Widowed    Spouse name: Not on  file   Number of children: Not on file   Years of education: Not on file   Highest education level: Not on file  Occupational History   Not on file  Tobacco Use   Smoking status: Former    Packs/day: 1.00    Years: 40.00    Pack years: 40.00    Types: Cigarettes   Smokeless tobacco: Never  Vaping Use   Vaping Use: Never used  Substance and Sexual Activity   Alcohol use: No   Drug use: Not Currently    Types: Marijuana   Sexual activity: Not on file  Other Topics Concern   Not on file  Social History Narrative   Not on file   Social Determinants of Health   Financial Resource Strain: High Risk   Difficulty of Paying Living Expenses: Very hard  Food Insecurity: Not on file  Transportation Needs: Not on file  Physical Activity: Not on file  Stress: Stress Concern Present   Feeling of Stress : Very much  Social Connections: Not on file  Intimate Partner Violence: Not on file    FAMILY HISTORY: Family History  Problem Relation Age of Onset   Peripheral Artery Disease Mother     ALLERGIES:  is allergic to diazepam, metoprolol, and tapazole [methimazole].  MEDICATIONS:  Current Outpatient Medications  Medication Sig Dispense Refill   bismuth subsalicylate (PEPTO BISMOL) 262 MG/15ML suspension Take 30 mLs by mouth every 6 (six) hours as needed.     diltiazem (DILACOR XR) 240 MG 24 hr capsule Take 1 capsule (240 mg total) by mouth once daily. 90 capsule 3   folic acid (FOLVITE) 1 MG tablet Take 1 tablet (1 mg total) by mouth once daily. 90 tablet 1   lansoprazole (PREVACID) 30 MG capsule Take 1 capsule (30 mg total) by mouth daily at 12 noon. Take while taking dexamethasone 10 capsule 0   lidocaine-prilocaine (EMLA) cream Apply 1 application topically daily as needed. 30 g 0   Multiple Vitamin (MULTI-VITAMINS) TABS Take 1 tablet by mouth daily.     ondansetron (ZOFRAN) 4 MG tablet Take 2 tablets (68m total) by mouth once every 8 hours as needed for nausea and vomiting.  80 tablet 1   TURMERIC PO Take 1 tablet by mouth daily.     albuterol (VENTOLIN HFA) 108 (90 Base) MCG/ACT inhaler Inhale 2 puffs into the lungs every 6 (six) hours as needed for wheezing or shortness of breath. (Patient not taking: Reported on 07/09/2021) 8.5 g 0   dexamethasone (DECADRON) 2 MG tablet Take 2 tablets (4 mg total) by mouth 2 (two) times daily. Starting 08/28/21:  Take 421mBID x3 days, 6m33maily x3 days, 2mg59mily x3 days. (Patient not taking: Reported on 09/11/2021) 21 tablet 0   nystatin (MYCOSTATIN) 100000 UNIT/ML suspension Swish and spit 5 mLs (500,000 Units total) by mouth once every 8 (eight) hours as needed. (Patient not taking: Reported on 07/09/2021) 150 mL 0   prochlorperazine (COMPAZINE) 10 MG tablet Take 1 tablet (10 mg total) by mouth once every 6 (six) hours as needed for nausea or vomiting. (Patient not taking: Reported on 07/09/2021) 40 tablet 1   No current facility-administered medications for this visit.    Left axillary lymphadenopathy noted ~1 to 2 cm in  size   PHYSICAL EXAMINATION: ECOG PERFORMANCE STATUS: 1 - Symptomatic but completely ambulatory  Vitals:   10/03/21 0840  BP: 109/78  Pulse: 95  Resp: 18  Temp: (!) 96.8 F (36 C)   Filed Weights   10/03/21 0840  Weight: 142 lb 9.6 oz (64.7 kg)    Physical Exam Vitals and nursing note reviewed.  HENT:     Head: Normocephalic and atraumatic.     Mouth/Throat:     Pharynx: Oropharynx is clear.  Eyes:     Extraocular Movements: Extraocular movements intact.     Pupils: Pupils are equal, round, and reactive to light.  Cardiovascular:     Rate and Rhythm: Normal rate and regular rhythm.  Pulmonary:     Comments: Decreased breath sounds bilaterally.  Abdominal:     Palpations: Abdomen is soft.  Musculoskeletal:        General: Normal range of motion.     Cervical back: Normal range of motion.  Skin:    General: Skin is warm.  Neurological:     General: No focal deficit present.      Mental Status: She is alert and oriented to person, place, and time.  Psychiatric:        Behavior: Behavior normal.        Judgment: Judgment normal.     LABORATORY DATA:  I have reviewed the data as listed Lab Results  Component Value Date   WBC 13.4 (H) 10/03/2021   HGB 13.4 10/03/2021   HCT 40.0 10/03/2021   MCV 99.5 10/03/2021   PLT 351 10/03/2021   Recent Labs    03/27/21 1635 03/28/21 0131 08/15/21 0817 09/12/21 0754 10/03/21 0819  NA  --    < > 139 136 137  K  --    < > 3.8 3.8 3.7  CL  --    < > 102 103 100  CO2  --    < > 25 27 26   GLUCOSE  --    < > 147* 125* 132*  BUN  --    < > 15 18 11   CREATININE  --    < > 0.65 0.76 0.74  CALCIUM  --    < > 9.5 9.0 9.4  GFRNONAA  --    < > >60 >60 >60  PROT 7.7   < > 8.0 7.3 7.7  ALBUMIN 3.2*   < > 3.7 3.6 3.8  AST 31   < > 59* 43* 61*  ALT 21   < > 41 42 46*  ALKPHOS 74   < > 82 76 84  BILITOT 1.2   < > 0.3 <0.1* 0.5  BILIDIR 0.4*  --   --   --   --   IBILI 0.8  --   --   --   --    < > = values in this interval not displayed.    RADIOGRAPHIC STUDIES: I have personally reviewed the radiological images as listed and agreed with the findings in the report. CT CHEST ABDOMEN PELVIS W CONTRAST  Result Date: 10/02/2021 CLINICAL DATA:  History of metastatic non-small cell lung cancer, assess treatment response EXAM: CT CHEST, ABDOMEN, AND PELVIS WITH CONTRAST TECHNIQUE: Multidetector CT imaging of the chest, abdomen and pelvis was performed following the standard protocol during bolus administration of intravenous contrast. RADIATION DOSE REDUCTION: This exam was performed according to the departmental dose-optimization program which includes automated exposure control, adjustment of the mA and/or kV according to  patient size and/or use of iterative reconstruction technique. CONTRAST:  150m OMNIPAQUE IOHEXOL 300 MG/ML  SOLN COMPARISON:  Multiple priors including most recent CT July 02, 2021 FINDINGS: CT CHEST FINDINGS  Cardiovascular: Right chest wall Port-A-Cath with tip at the superior cavoatrial junction. Aortic atherosclerosis without aneurysmal dilation. No central pulmonary embolus on this nondedicated study. Normal size heart. No significant pericardial effusion/thickening. Mediastinum/Nodes: No discrete thyroid nodule. Mild symmetric wall thickening of the esophagus with reflux versus retained contrast in the esophagus, suggestive of reflux esophagitis. Index lymph nodes are as follows: -left supraclavicular lymph node measures 3 mm in short axis on image 4/2 previously 6 mm. -right paratracheal lymph node measures 4 mm in short axis on image 15/2 previously 6 mm. -low right paratracheal lymph node measures 3-4 mm in short axis on image 20/2 previously 6 mm. Increase in size lobular appearing left axillary lymph nodes the largest of which measures 12 mm in short axis on image 9/2 previously 5 mm. Lungs/Pleura: Right upper lobe lung mass measures 3.5 x 2.4 x 2.9 cm (volume = 13 cm^3) on image 15/2 and 70/6 previously measuring 3.5 x 2.5 x 2.8 cm (volume = 13 cm^3) when remeasured for consistency. Similar interlobular septal thickening in the right lower lobe. No new suspicious pulmonary nodules or masses. Moderate changes of centrilobular and paraseptal emphysema with multifocal areas of scarring are similar prior. Musculoskeletal: No chest wall mass or suspicious bone lesions identified. CT ABDOMEN PELVIS FINDINGS Hepatobiliary: No suspicious hepatic lesion. Gallbladder is unremarkable. No biliary ductal dilation. Pancreas: No pancreatic ductal dilation or evidence of acute inflammation. Spleen: Normal in size without focal abnormality. Adrenals/Urinary Tract: Bilateral adrenal glands appear normal. No hydronephrosis. Two exophytic hypodense left renal cysts measuring up to 9 mm. Kidneys demonstrate symmetric enhancement and excretion of contrast material. Urinary bladder is unremarkable for degree of distension.  Stomach/Bowel: No enteric contrast was administered. Stomach is unremarkable for degree of distension. No pathologic dilation of small or large bowel. 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 Vascular/Lymphatic: Aortic atherosclerosis without abdominal aortic aneurysm. Prominent periportal lymph nodes measuring up to 8 mm are unchanged dating back to at least 2018 consistent with a reactive process. No pathologically enlarged abdominal or pelvic lymph nodes. Reproductive: Uterus and bilateral adnexa are unremarkable. Other: No significant abdominopelvic free fluid. Musculoskeletal: No aggressive lytic or blastic lesion of bone. IMPRESSION: 1. Increase in size of lobular centrally necrotic left axillary lymph nodes measuring up to 12 mm in short axis, a nonspecific finding but which is concerning for malignancy, and while metastatic involvement of the patient's primary lung malignancy is a differential consideration it is felt less likely, with a more probable differential consideration including occult left breast malignancy. Recommend further evaluation with mammography and dedicated ultrasound with possible FNA/biopsy. 2. No significant change in size of the right lung mass, with stable right lower lobe septal thickening and decreased size of the index mediastinal lymph nodes. 3. No evidence of metastatic disease within the abdomen or pelvis. 4. 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 5. Mild symmetric wall thickening of the esophagus with reflux versus retained contrast in the esophagus, suggestive of  reflux esophagitis. 6. Aortic  Atherosclerosis (ICD10-I70.0) and Emphysema (ICD10-J43.9). These results will be called to the ordering clinician or representative by the Radiologist Assistant, and communication documented in the PACS or [employer]. Electronically Signed   By: Dahlia Bailiff M.D.   On: 10/02/2021 12:42    ASSESSMENT & PLAN:   Cancer of upper lobe of right lung ([place]) #Stage IV-lung cancer-adenocarcinoma. AUG 2022- MRI brain-subcentimeter brain lesions [asymptomatic-see below];  JAN, 23rd 2023-No significant change in size of the right lung mass, with stable right lower lobe septal thickening and decreased size of the index mediastinal lymph nodes.No evidence of metastatic disease within the abdomen or pelvis; see below.  #  Continue with Alimta Keytruda maintenance Labs today reviewed;  acceptable for treatment today.    #CT scan 2023- increase in size of lobular centrally necrotic left axillary lymph nodes ~ 12 mm- DDx: occult left breast Malignancy vs metastatic lung cancer. Recommend further evaluation with mammography and dedicated ultrasound with possible /biopsy [previous mammo- [facility]].  Mammogram ordered.  # chest pain left radiating to left arm/shoulder-suspect musculoskeletal rather than true primary cardiac pain.  Encourage patient to follow-up with cardiology.  Will inform cardiology.  # Subcentimeter brain lesions-clinically asymptomatic. Repeat MRI brain- 11/22 Mildly increased size of a left parietal metastasis with increased, mild edema. Decreased size of 2 other metastases. S/p SRS- finished on DEC 23rd, 2022- awaiting 2/15- MRI.    # COPD stable-significant provement s/p/bronchodilators/diuretics- STABLE  #A. Fib-amiodarone/Cardizem as per cardiology-  Not on anti-coagulation; S/p Cards eval in Jan 2023- STABLE.   # COVID VACCINATION: Un-vaccinated; DECLINES/declined- EVUSHELD.   #Incidental findings on CT Imaging dated: JAn 18th, 2023:emphysema;  atherosclerosis; diverticulosis- reviewed/discussed/counseled the patient.   # Port malfunction: STABLE.   # DISPOSITION: # bil mammogram screening- Left breast occult cancer/Left axilla LN  # chemo today; # follow up in 3 week- Thursday/Friday;   MD; labs- cbc/cmp;; Alimta-keytruda; B12 injection- Dr.B     All questions were answered. The patient knows to call the clinic with any problems, questions or concerns.    Cammie Sickle, MD 10/03/2021 1:02 PM

## 2021-10-04 ENCOUNTER — Telehealth: Payer: Self-pay | Admitting: Internal Medicine

## 2021-10-04 ENCOUNTER — Other Ambulatory Visit: Payer: Self-pay | Admitting: Nurse Practitioner

## 2021-10-04 DIAGNOSIS — E032 Hypothyroidism due to medicaments and other exogenous substances: Secondary | ICD-10-CM

## 2021-10-04 MED ORDER — LEVOTHYROXINE SODIUM 150 MCG PO TABS
150.0000 ug | ORAL_TABLET | Freq: Every day | ORAL | 2 refills | Status: DC
Start: 2021-10-04 — End: 2022-01-03

## 2021-10-04 NOTE — Telephone Encounter (Signed)
I spoke to pt re: results of TSH- start synthroid 150 mcg- precaution discussed.   H/A- please order thyoid profile at next visit.   Thanks GB

## 2021-10-05 ENCOUNTER — Other Ambulatory Visit: Payer: Self-pay

## 2021-10-05 DIAGNOSIS — E032 Hypothyroidism due to medicaments and other exogenous substances: Secondary | ICD-10-CM

## 2021-10-05 NOTE — Telephone Encounter (Signed)
Lab order entered.

## 2021-10-08 ENCOUNTER — Other Ambulatory Visit: Payer: Self-pay | Admitting: *Deleted

## 2021-10-08 ENCOUNTER — Other Ambulatory Visit: Payer: Self-pay | Admitting: Internal Medicine

## 2021-10-08 ENCOUNTER — Inpatient Hospital Stay
Admission: RE | Admit: 2021-10-08 | Discharge: 2021-10-08 | Disposition: A | Discharge status: Home / Self Care | Payer: Self-pay | Source: Ambulatory Visit | Attending: *Deleted | Admitting: *Deleted

## 2021-10-08 DIAGNOSIS — Z1231 Encounter for screening mammogram for malignant neoplasm of breast: Secondary | ICD-10-CM

## 2021-10-10 ENCOUNTER — Other Ambulatory Visit (HOSPITAL_COMMUNITY): Payer: Self-pay

## 2021-10-15 ENCOUNTER — Telehealth: Payer: Self-pay | Admitting: Internal Medicine

## 2021-10-15 ENCOUNTER — Other Ambulatory Visit: Payer: Self-pay | Admitting: *Deleted

## 2021-10-15 MED ORDER — FOLIC ACID 1 MG PO TABS: 1.0000 mg | ORAL_TABLET | Freq: Every day | ORAL | 1 refills | Status: AC

## 2021-10-15 NOTE — Telephone Encounter (Signed)
Patient's daughter called and stated that her mom needs a refill on her folic acid RX. She uses the [facility] on [address] that is on file.

## 2021-10-18 ENCOUNTER — Other Ambulatory Visit: Payer: Self-pay

## 2021-10-18 ENCOUNTER — Ambulatory Visit
Admission: RE | Admit: 2021-10-18 | Discharge: 2021-10-18 | Disposition: A | Discharge status: Home / Self Care | Payer: Medicaid Other | Source: Ambulatory Visit | Attending: Internal Medicine | Admitting: Internal Medicine

## 2021-10-18 ENCOUNTER — Other Ambulatory Visit: Payer: Self-pay | Admitting: Internal Medicine

## 2021-10-18 DIAGNOSIS — R590 Localized enlarged lymph nodes: Secondary | ICD-10-CM | POA: Insufficient documentation

## 2021-10-18 DIAGNOSIS — C3411 Malignant neoplasm of upper lobe, right bronchus or lung: Secondary | ICD-10-CM

## 2021-10-18 DIAGNOSIS — R928 Other abnormal and inconclusive findings on diagnostic imaging of breast: Secondary | ICD-10-CM

## 2021-10-23 ENCOUNTER — Other Ambulatory Visit: Payer: Self-pay

## 2021-10-23 ENCOUNTER — Ambulatory Visit (INDEPENDENT_AMBULATORY_CARE_PROVIDER_SITE_OTHER): Payer: Medicaid Other

## 2021-10-23 DIAGNOSIS — I3131 Malignant pericardial effusion in diseases classified elsewhere: Secondary | ICD-10-CM

## 2021-10-23 DIAGNOSIS — I3139 Other pericardial effusion (noninflammatory): Secondary | ICD-10-CM

## 2021-10-23 LAB — ECHOCARDIOGRAM LIMITED
Calc EF: 56.9 %
S' Lateral: 2.8 cm
Single Plane A2C EF: 55.5 %
Single Plane A4C EF: 57.1 %

## 2021-10-24 ENCOUNTER — Ambulatory Visit
Admission: RE | Admit: 2021-10-24 | Discharge: 2021-10-24 | Disposition: A | Discharge status: Home / Self Care | Payer: Medicaid Other | Source: Ambulatory Visit | Attending: Internal Medicine | Admitting: Internal Medicine

## 2021-10-24 ENCOUNTER — Telehealth: Payer: Self-pay

## 2021-10-24 DIAGNOSIS — C7931 Secondary malignant neoplasm of brain: Secondary | ICD-10-CM | POA: Diagnosis present

## 2021-10-24 DIAGNOSIS — C3411 Malignant neoplasm of upper lobe, right bronchus or lung: Secondary | ICD-10-CM | POA: Insufficient documentation

## 2021-10-24 MED ORDER — GADOBUTROL 1 MMOL/ML IV SOLN
6.0000 mL | Freq: Once | INTRAVENOUS | Status: AC | PRN
  Administered 2021-10-24: 6 mL via INTRAVENOUS

## 2021-10-24 NOTE — Telephone Encounter (Signed)
-----   Message from Rise Mu, PA-C sent at 10/24/2021  7:26 AM EST ----- Echo showed a normal pump function, normal wall motion, stable trivial pericardial effusion, and no significant valvular abnormalities. Overall, reassuring echo.

## 2021-10-24 NOTE — Telephone Encounter (Signed)
Left a VM requesting a call back to go over Echo results.

## 2021-10-25 ENCOUNTER — Inpatient Hospital Stay (HOSPITAL_BASED_OUTPATIENT_CLINIC_OR_DEPARTMENT_OTHER): Payer: Medicaid Other | Admitting: Internal Medicine

## 2021-10-25 ENCOUNTER — Other Ambulatory Visit: Payer: Self-pay

## 2021-10-25 ENCOUNTER — Inpatient Hospital Stay: Payer: Medicaid Other

## 2021-10-25 ENCOUNTER — Inpatient Hospital Stay: Payer: Medicaid Other | Attending: Internal Medicine

## 2021-10-25 ENCOUNTER — Encounter: Payer: Self-pay | Admitting: Internal Medicine

## 2021-10-25 DIAGNOSIS — E039 Hypothyroidism, unspecified: Secondary | ICD-10-CM | POA: Diagnosis not present

## 2021-10-25 DIAGNOSIS — I3131 Malignant pericardial effusion in diseases classified elsewhere: Secondary | ICD-10-CM

## 2021-10-25 DIAGNOSIS — Z5112 Encounter for antineoplastic immunotherapy: Secondary | ICD-10-CM | POA: Diagnosis present

## 2021-10-25 DIAGNOSIS — C3411 Malignant neoplasm of upper lobe, right bronchus or lung: Secondary | ICD-10-CM

## 2021-10-25 DIAGNOSIS — Z5111 Encounter for antineoplastic chemotherapy: Secondary | ICD-10-CM | POA: Insufficient documentation

## 2021-10-25 DIAGNOSIS — E032 Hypothyroidism due to medicaments and other exogenous substances: Secondary | ICD-10-CM

## 2021-10-25 LAB — CBC WITH DIFFERENTIAL/PLATELET
Abs Immature Granulocytes: 0.1 10*3/uL — ABNORMAL HIGH (ref 0.00–0.07)
Basophils Absolute: 0.1 10*3/uL (ref 0.0–0.1)
Basophils Relative: 1 %
Eosinophils Absolute: 0.1 10*3/uL (ref 0.0–0.5)
Eosinophils Relative: 1 %
HCT: 37.4 % (ref 36.0–46.0)
Hemoglobin: 12.5 g/dL (ref 12.0–15.0)
Immature Granulocytes: 1 %
Lymphocytes Relative: 25 %
Lymphs Abs: 2.7 10*3/uL (ref 0.7–4.0)
MCH: 33.7 pg (ref 26.0–34.0)
MCHC: 33.4 g/dL (ref 30.0–36.0)
MCV: 100.8 fL — ABNORMAL HIGH (ref 80.0–100.0)
Monocytes Absolute: 1.3 10*3/uL — ABNORMAL HIGH (ref 0.1–1.0)
Monocytes Relative: 12 %
Neutro Abs: 6.3 10*3/uL (ref 1.7–7.7)
Neutrophils Relative %: 60 %
Platelets: 369 10*3/uL (ref 150–400)
RBC: 3.71 MIL/uL — ABNORMAL LOW (ref 3.87–5.11)
RDW: 15.1 % (ref 11.5–15.5)
WBC: 10.6 10*3/uL — ABNORMAL HIGH (ref 4.0–10.5)
nRBC: 0 % (ref 0.0–0.2)

## 2021-10-25 LAB — COMPREHENSIVE METABOLIC PANEL
ALT: 40 U/L (ref 0–44)
AST: 59 U/L — ABNORMAL HIGH (ref 15–41)
Albumin: 3.7 g/dL (ref 3.5–5.0)
Alkaline Phosphatase: 86 U/L (ref 38–126)
Anion gap: 8 (ref 5–15)
BUN: 13 mg/dL (ref 6–20)
CO2: 25 mmol/L (ref 22–32)
Calcium: 9.4 mg/dL (ref 8.9–10.3)
Chloride: 103 mmol/L (ref 98–111)
Creatinine, Ser: 0.68 mg/dL (ref 0.44–1.00)
GFR, Estimated: >60 mL/min (ref >60)
Glucose, Bld: 148 mg/dL — ABNORMAL HIGH (ref 70–99)
Potassium: 4.1 mmol/L (ref 3.5–5.1)
Sodium: 136 mmol/L (ref 135–145)
Total Bilirubin: 0.4 mg/dL (ref 0.3–1.2)
Total Protein: 8.6 g/dL — ABNORMAL HIGH (ref 6.5–8.1)

## 2021-10-25 MED ORDER — HEPARIN SOD (PORK) LOCK FLUSH 100 UNIT/ML IV SOLN
INTRAVENOUS | Status: AC
  Filled 2021-10-25: qty 5

## 2021-10-25 MED ORDER — SODIUM CHLORIDE 0.9 % IV SOLN
200.0000 mg | Freq: Once | INTRAVENOUS | Status: AC
  Administered 2021-10-25: 200 mg via INTRAVENOUS
  Filled 2021-10-25: qty 200

## 2021-10-25 MED ORDER — CYANOCOBALAMIN 1000 MCG/ML IJ SOLN
1000.0000 ug | Freq: Once | INTRAMUSCULAR | Status: AC
  Administered 2021-10-25: 1000 ug via INTRAMUSCULAR
  Filled 2021-10-25: qty 1

## 2021-10-25 MED ORDER — SODIUM CHLORIDE 0.9 % IV SOLN
500.0000 mg/m2 | Freq: Once | INTRAVENOUS | Status: AC
  Administered 2021-10-25: 800 mg via INTRAVENOUS
  Filled 2021-10-25: qty 20

## 2021-10-25 MED ORDER — SODIUM CHLORIDE 0.9 % IV SOLN
Freq: Once | INTRAVENOUS | Status: AC
  Filled 2021-10-25: qty 250

## 2021-10-25 MED ORDER — PROCHLORPERAZINE MALEATE 10 MG PO TABS
10.0000 mg | ORAL_TABLET | Freq: Once | ORAL | Status: AC
  Administered 2021-10-25: 10 mg via ORAL
  Filled 2021-10-25: qty 1

## 2021-10-25 NOTE — Progress Notes (Signed)
[facility] NOTE  Patient Care Team: [place] as PCP - General End, Harrell Gave, MD as PCP - Cardiology (Cardiology) Telford Nab, RN as Oncology Nurse Navigator  CHIEF COMPLAINTS/PURPOSE OF CONSULTATION: Lung cancer  #  Oncology History Overview Note  # AUG 2022-stage IV adenocarcinoma of the lung [s/p pericardial effusion/tamponade]. MRI Brain AUG 16th, 2022- IMPRESSION: Multiple peripherally enhancing, centrally T1 hypointense lesions, with surrounding T2 hyperintense signal in the bilateral cerebral hemispheres, concerning for metastatic disease.    # AUG 17th 2022- CARBO-ALIMTA-Keytruda #1  # Mammogram- FEB 2023-no lesions noted in the breasts; 1 to 2 cm lymph node left underarm; awaiting biopsy   # Repeat MRI brain- 11/22 Mildly increased size of a left parietal metastasis with increased, mild edema. Decreased size of 2 other metastases. 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  S/p pericardiocentesis.  NGS; PDL-1-0    Malignant pericardial effusion  03/28/2021 Initial Diagnosis   Malignant pericardial effusion ([facility]   04/25/2021 -  Chemotherapy   Patient is on Treatment Plan : LUNG CARBOplatin / Pemetrexed / Pembrolizumab q21d Induction x 4 cycles / Maintenance Pemetrexed + Pembrolizumab     Can[place] of [place] of right lung ([place]  04/10/2021 Initial Diagnosis   [place] lobe of right lung ([place]   04/10/2021 Cancer Staging   Staging form: Lung, AJCC 8th Edition - Clinical: Stage IVA (cT2, cN2, cM1a) - Signed by BCammie Sickle MD on 04/10/2021       HISTORY OF PRESENTING ILLNESS: Accompanied by family; patient currently in wheelchair.  Nichole Kail61y.o.  female  stage IV adenocarcinoma the lung cancer is here currently on chemotherapy-immunotherapy is here for a follow  up/review results of mammogram/MRI brain.  At last visit patient was started on Synthroid-for hypothyroidism.  Patient admits to compliance with Synthroid.  Patient denies any further episodes of chest pain.  Patient denies any further episodes of chest pain.  Chronic mild shortness of breath.  No cough.  Review of Systems  Constitutional:  Positive for malaise/fatigue. Negative for chills, diaphoresis and fever.  HENT:  Negative for nosebleeds and sore throat.   Eyes:  Negative for double vision.  Respiratory:  Positive for shortness of breath. Negative for cough, hemoptysis, sputum production and wheezing.   Cardiovascular:  Negative for chest pain, palpitations, orthopnea and leg swelling.  Gastrointestinal:  Negative for abdominal pain, blood in stool, constipation, diarrhea, heartburn, melena, nausea and vomiting.  Genitourinary:  Negative for dysuria, frequency and urgency.  Musculoskeletal:  Positive for myalgias. Negative for back pain and joint pain.  Skin: Negative.  Negative for itching and rash.  Neurological:  Negative for dizziness, tingling, focal weakness, weakness and headaches.  Endo/Heme/Allergies:  Does not bruise/bleed easily.  Psychiatric/Behavioral:  Negative for depression. The patient is not nervous/anxious and does not have insomnia.     MEDICAL HISTORY:  Past Medical History:  Diagnosis Date   Graves disease    Malignant pericardial effusion    Paroxysmal atrial fibrillation ([facility]    Primary lung adenocarcinoma ([place]     SURGICAL HISTORY: Past Surgical History:  Procedure Laterality Date   IR CV LINE INJECTION  06/19/2021   IR IMAGING GUIDED PORT INSERTION  04/27/2021   PERICARDIOCENTESIS N/A 03/27/2021   Procedure: PERICARDIOCENTESIS;  Surgeon: ENelva Bush MD;  [facility];  Service: Cardiovascular;  Laterality: N/A;   TUBAL LIGATION      SOCIAL HISTORY: Social History   Socioeconomic History   Marital status:  Widowed    Spouse name: Not on file   Number of children: Not on file   Years of education: Not on file   Highest education level: Not on file  Occupational History   Not on file  Tobacco Use   Smoking status: Former    Packs/day: 1.00    Years: 40.00    Pack years: 40.00    Types: Cigarettes   Smokeless tobacco: Never  Vaping Use   Vaping Use: Never used  Substance and Sexual Activity   Alcohol use: No   Drug use: Not Currently    Types: Marijuana   Sexual activity: Not Currently  Other Topics Concern   Not on file  Social History Narrative   Not on file   Social Determinants of Health   Financial Resource Strain: High Risk   Difficulty of Paying Living Expenses: Very hard  Food Insecurity: Not on file  Transportation Needs: Not on file  Physical Activity: Not on file  Stress: Stress Concern Present   Feeling of Stress : Very much  Social Connections: Not on file  Intimate Partner Violence: Not on file    FAMILY HISTORY: Family History  Problem Relation Age of Onset   Peripheral Artery Disease Mother    Breast cancer Neg Hx     ALLERGIES:  is allergic to diazepam, metoprolol, and tapazole [methimazole].  MEDICATIONS:  Current Outpatient Medications  Medication Sig Dispense Refill   bismuth subsalicylate (PEPTO BISMOL) 262 MG/15ML suspension Take 30 mLs by mouth every 6 (six) hours as needed.     diltiazem (DILACOR XR) 240 MG 24 hr capsule Take 1 capsule (240 mg total) by mouth once daily. 90 capsule 3   folic acid (FOLVITE) 1 MG tablet Take 1 tablet (1 mg total) by mouth once daily. 90 tablet 1   levothyroxine (SYNTHROID) 150 MCG tablet Take 1 tablet (150 mcg total) by mouth daily before breakfast. Take on empty stomach. 30 tablet 2   lidocaine-prilocaine (EMLA) cream Apply 1 application topically daily as needed. 30 g 0   Multiple Vitamin (MULTI-VITAMINS) TABS Take 1 tablet by mouth daily.     ondansetron (ZOFRAN) 4 MG tablet Take 2  tablets (68m total) by mouth once every 8 hours as needed for nausea and vomiting. 80 tablet 1   TURMERIC PO Take 1 tablet by mouth daily.     No current facility-administered medications for this visit.    Left axillary lymphadenopathy noted ~1 to 2 cm in size   PHYSICAL EXAMINATION: ECOG PERFORMANCE STATUS: 1 - Symptomatic but completely ambulatory  Vitals:   10/25/21 0840  BP: 119/90  Pulse: 96  Temp: (!) 97.5 F (36.4 C)  SpO2: 97%   Filed Weights   10/25/21 0840  Weight: 141 lb 6.4 oz (64.1 kg)    Physical Exam Vitals and nursing note reviewed.  HENT:     Head: Normocephalic and atraumatic.     Mouth/Throat:     Pharynx: Oropharynx is clear.  Eyes:     Extraocular Movements: Extraocular movements intact.     Pupils: Pupils are equal, round, and reactive to light.  Cardiovascular:     Rate and Rhythm: Normal rate and regular rhythm.  Pulmonary:     Comments: Decreased breath sounds bilaterally.  Abdominal:     Palpations: Abdomen is soft.  Musculoskeletal:  General: Normal range of motion.     Cervical back: Normal range of motion.  Skin:    General: Skin is warm.  Neurological:     General: No focal deficit present.     Mental Status: She is alert and oriented to person, place, and time.  Psychiatric:        Behavior: Behavior normal.        Judgment: Judgment normal.     LABORATORY DATA:  I have reviewed the data as listed Lab Results  Component Value Date   WBC 10.6 (H) 10/25/2021   HGB 12.5 10/25/2021   HCT 37.4 10/25/2021   MCV 100.8 (H) 10/25/2021   PLT 369 10/25/2021   Recent Labs    03/27/21 1635 03/28/21 0131 09/12/21 0754 10/03/21 0819 10/25/21 0802  NA  --    < > 136 137 136  K  --    < > 3.8 3.7 4.1  CL  --    < > 103 100 103  CO2  --    < > 27 26 25   GLUCOSE  --    < > 125* 132* 148*  BUN  --    < > 18 11 13   CREATININE  --    < > 0.76 0.74 0.68  CALCIUM  --    < > 9.0 9.4 9.4  GFRNONAA  --    < > >60 >60 >60   PROT 7.7   < > 7.3 7.7 8.6*  ALBUMIN 3.2*   < > 3.6 3.8 3.7  AST 31   < > 43* 61* 59*  ALT 21   < > 42 46* 40  ALKPHOS 74   < > 76 84 86  BILITOT 1.2   < > <0.1* 0.5 0.4  BILIDIR 0.4*  --   --   --   --   IBILI 0.8  --   --   --   --    < > = values in this interval not displayed.    RADIOGRAPHIC STUDIES: I have personally reviewed the radiological images as listed and agreed with the findings in the report. MR Brain W Wo Contrast  Result Date: 10/25/2021 CLINICAL DATA:  Lung carcinoma with brain metastases. Follow-up after radiation treatment. EXAM: MRI HEAD WITHOUT AND WITH CONTRAST TECHNIQUE: Multiplanar, multiecho pulse sequences of the brain and surrounding structures were obtained without and with intravenous contrast. CONTRAST:  72m GADAVIST GADOBUTROL 1 MMOL/ML IV SOLN COMPARISON:  08/22/2021 FINDINGS: Brain: No acute infarct, mass effect or extra-axial collection. No acute or chronic hemorrhage. The left parietal lesion has decreased in size and now measures 7 mm (series 18, image 108). Punctate anterior left frontal lesion (image 120) is also smaller. The other small left frontal lesion has resolved. No new lesions. The midline structures are normal. Vascular: Major flow voids are preserved. Skull and upper cervical spine: Normal calvarium and skull base. Visualized upper cervical spine and soft tissues are normal. Sinuses/Orbits:No paranasal sinus fluid levels or advanced mucosal thickening. No mastoid or middle ear effusion. Normal orbits. IMPRESSION: Decreased size of left parietal and left frontal metastases. No new lesions. Electronically Signed   By: KUlyses JarredM.D.   On: 10/25/2021 02:32   CT CHEST ABDOMEN PELVIS W CONTRAST  Result Date: 10/02/2021 CLINICAL DATA:  History of metastatic non-small cell lung cancer, assess treatment response EXAM: CT CHEST, ABDOMEN, AND PELVIS WITH CONTRAST TECHNIQUE: Multidetector CT imaging of the chest, abdomen and pelvis was performed  following the standard protocol during bolus administration of intravenous contrast. RADIATION DOSE REDUCTION: This exam was performed according to the departmental dose-optimization program which includes automated exposure control, adjustment of the mA and/or kV according to patient size and/or use of iterative reconstruction technique. CONTRAST:  13m OMNIPAQUE IOHEXOL 300 MG/ML  SOLN COMPARISON:  Multiple priors including most recent CT July 02, 2021 FINDINGS: CT CHEST FINDINGS Cardiovascular: Right chest wall Port-A-Cath with tip at the superior cavoatrial junction. Aortic atherosclerosis without aneurysmal dilation. No central pulmonary embolus on this nondedicated study. Normal size heart. No significant pericardial effusion/thickening. Mediastinum/Nodes: No discrete thyroid nodule. Mild symmetric wall thickening of the esophagus with reflux versus retained contrast in the esophagus, suggestive of reflux esophagitis. Index lymph nodes are as follows: -left supraclavicular lymph node measures 3 mm in short axis on image 4/2 previously 6 mm. -right paratracheal lymph node measures 4 mm in short axis on image 15/2 previously 6 mm. -low right paratracheal lymph node measures 3-4 mm in short axis on image 20/2 previously 6 mm. Increase in size lobular appearing left axillary lymph nodes the largest of which measures 12 mm in short axis on image 9/2 previously 5 mm. Lungs/Pleura: Right upper lobe lung mass measures 3.5 x 2.4 x 2.9 cm (volume = 13 cm^3) on image 15/2 and 70/6 previously measuring 3.5 x 2.5 x 2.8 cm (volume = 13 cm^3) when remeasured for consistency. Similar interlobular septal thickening in the right lower lobe. No new suspicious pulmonary nodules or masses. Moderate changes of centrilobular and paraseptal emphysema with multifocal areas of scarring are similar prior. Musculoskeletal: No chest wall mass or suspicious bone lesions identified. CT ABDOMEN PELVIS FINDINGS Hepatobiliary: No  suspicious hepatic lesion. Gallbladder is unremarkable. No biliary ductal dilation. Pancreas: No pancreatic ductal dilation or evidence of acute inflammation. Spleen: Normal in size without focal abnormality. Adrenals/Urinary Tract: Bilateral adrenal glands appear normal. No hydronephrosis. Two exophytic hypodense left renal cysts measuring up to 9 mm. Kidneys demonstrate symmetric enhancement and excretion of contrast material. Urinary bladder is unremarkable for degree of distension. Stomach/Bowel: No enteric contrast was administered. Stomach is unremarkable for degree of distension. No pathologic dilation of small or large bowel. 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 Vascular/Lymphatic: Aortic atherosclerosis without abdominal aortic aneurysm. Prominent periportal lymph nodes measuring up to 8 mm are unchanged dating back to at least 2018 consistent with a reactive process. No pathologically enlarged abdominal or pelvic lymph nodes. Reproductive: Uterus and bilateral adnexa are unremarkable. Other: No significant abdominopelvic free fluid. Musculoskeletal: No aggressive lytic or blastic lesion of bone. IMPRESSION: 1. Increase in size of lobular centrally necrotic left axillary lymph nodes measuring up to 12 mm in short axis, a nonspecific finding but which is concerning for malignancy, and while metastatic involvement of the patient's primary lung malignancy is a differential consideration it is felt less likely, with a more probable differential consideration including occult left breast malignancy. Recommend further evaluation with mammography and dedicated ultrasound with possible FNA/biopsy. 2. No significant change in size of the right lung mass, with stable right lower lobe septal thickening and  decreased size of the index mediastinal lymph nodes. 3. No evidence of metastatic disease within the abdomen or pelvis. 4. Colonic diverticulosis with long segment wall thickening of the sigmoid colon without adjacent inflammatory stranding similar dating back to at least  March 19, 2017 and most likely reflecting chronic segmental colitis associated with diverticulosis. 5. Mild symmetric wall thickening of the esophagus with reflux versus retained contrast in the esophagus, suggestive of reflux esophagitis. 6. Aortic Atherosclerosis (ICD10-I70.0) and Emphysema (ICD10-J43.9). These results will be called to the ordering clinician or representative by the Radiologist Assistant, and communication documented in the PACS or [employer]. Electronically Signed   By: Dahlia Bailiff M.D.   On: 10/02/2021 12:42   US Breast Limited Uni Left Inc Axilla  Result Date: 10/18/2021 CLINICAL DATA:  61 year old female presenting for evaluation of enlarged left axillary lymph nodes seen on recent CT. Patient has a history of metastatic non-small cell lung cancer. EXAM: DIGITAL DIAGNOSTIC BILATERAL MAMMOGRAM WITH TOMOSYNTHESIS AND CAD; ULTRASOUND LEFT BREAST LIMITED TECHNIQUE: Bilateral digital diagnostic mammography and breast tomosynthesis was performed. The images were evaluated with computer-aided detection.; Targeted ultrasound examination of the left breast was performed. COMPARISON:  Previous exam(s). ACR Breast Density Category b: There are scattered areas of fibroglandular density. FINDINGS: Mammogram: Right breast: No suspicious mass, distortion, or microcalcifications are identified to suggest presence of malignancy. Left breast: No suspicious mass, distortion, or microcalcifications are identified to suggest presence of malignancy in the left breast. A skin BB marks the site of tenderness reported by the patient in the left axilla. There are several enlarged lymph nodes visualized in the left axilla. Ultrasound:  Targeted ultrasound performed in the left axilla demonstrating at least 3 lymph nodes with cortical thickening measuring up to 0.5 cm. IMPRESSION: 1. Left axillary adenopathy of uncertain etiology, possibly related to metastatic lung cancer. There is no suspicious finding in the left breast. 2.  No mammographic evidence of malignancy in the right breast. RECOMMENDATION: 1. Ultrasound-guided core needle biopsy of a left axillary lymph node. 2. If the biopsy returns with metastatic pathology of breast origin recommend breast MRI for further evaluation. I have discussed the findings and recommendations with the patient. The patient will be contacted by our scheduler to arrange the biopsy appointment. BI-RADS CATEGORY  4: Suspicious. Electronically Signed   By: Audie Pinto M.D.   On: 10/18/2021 12:29  MM DIAG BREAST TOMO BILATERAL  Result Date: 10/18/2021 CLINICAL DATA:  61 year old female presenting for evaluation of enlarged left axillary lymph nodes seen on recent CT. Patient has a history of metastatic non-small cell lung cancer. EXAM: DIGITAL DIAGNOSTIC BILATERAL MAMMOGRAM WITH TOMOSYNTHESIS AND CAD; ULTRASOUND LEFT BREAST LIMITED TECHNIQUE: Bilateral digital diagnostic mammography and breast tomosynthesis was performed. The images were evaluated with computer-aided detection.; Targeted ultrasound examination of the left breast was performed. COMPARISON:  Previous exam(s). ACR Breast Density Category b: There are scattered areas of fibroglandular density. FINDINGS: Mammogram: Right breast: No suspicious mass, distortion, or microcalcifications are identified to suggest presence of malignancy. Left breast: No suspicious mass, distortion, or microcalcifications are identified to suggest presence of malignancy in the left breast. A skin BB marks the site of tenderness reported by the patient in the left axilla. There are several enlarged lymph nodes visualized in the left axilla. Ultrasound: Targeted  ultrasound performed in the left axilla demonstrating at least 3 lymph nodes with cortical thickening measuring up to 0.5 cm. IMPRESSION: 1. Left axillary adenopathy of uncertain etiology, possibly related to metastatic lung cancer. There is no suspicious finding in the left breast. 2.  No mammographic evidence of malignancy in the right breast. RECOMMENDATION: 1. Ultrasound-guided core needle biopsy of a left axillary lymph node. 2. If the biopsy returns with metastatic pathology of breast  origin recommend breast MRI for further evaluation. I have discussed the findings and recommendations with the patient. The patient will be contacted by our scheduler to arrange the biopsy appointment. BI-RADS CATEGORY  4: Suspicious. Electronically Signed   By: Audie Pinto M.D.   On: 10/18/2021 12:29  ECHOCARDIOGRAM LIMITED  Result Date: 10/23/2021    ECHOCARDIOGRAM LIMITED REPORT   Patient Name:   Nichole Park Date of Exam: 10/23/2021 Medical Rec #:  [id number]   Height:       61.5 in Accession #:    [id number]  Weight:       142.6 lb Date of Birth:  August 05, 1961  BSA:          1.646 m Patient Age:    84 years    BP:           109/78 mmHg Patient Gender: F           HR:           92 bpm. Exam Location:  [facility] Procedure: Limited Echo and Limited Color Doppler Indications:    I31.3 Pericardial effusion (noninflammatory)  History:        Patient has prior history of Echocardiogram examinations, most                 recent 06/21/2021. Pericardial Disease, COPD, Arrythmias:Atrial                 Fibrillation and Tachycardia, Signs/Symptoms:Shortness of Breath                 and Chest Pain; Risk Factors:Hypertension and Former Smoker.                 Patient experienced chest pain at the beginning of January,                 2023. It lasted 1 hr and has not reoccurred. Since that event                 she hasa felt much more fatigued.  Sonographer:    Pilar Jarvis RDMS, RVT, RDCS Referring Phys: [id number] [place]  1. Left ventricular ejection fraction, by estimation, is 60 to 65%. The left ventricle has normal function. The left ventricle has no regional wall motion abnormalities.  2. Right ventricular systolic function is normal. The right ventricular size is normal.  3. The mitral valve is normal in structure. No evidence of mitral valve regurgitation. No evidence of mitral stenosis.  4. The aortic valve was not well visualized. Aortic valve regurgitation is not visualized. No aortic stenosis is present.  5. The inferior vena cava is normal in size with greater than 50% respiratory variability, suggesting right atrial pressure of 3 mmHg. FINDINGS  Left Ventricle: Left ventricular ejection fraction, by estimation, is 60 to 65%. The left ventricle has normal function. The left ventricle has no regional wall motion abnormalities. The left ventricular internal cavity size was normal in size. There is  no left ventricular hypertrophy. Right Ventricle: The right ventricular size is normal. No increase in right ventricular wall thickness. Right ventricular systolic function is normal. Left Atrium: Left atrial size was normal in size. Right Atrium: Right atrial size was normal in size. Pericardium: Trivial pericardial effusion is present. Mitral Valve: The mitral valve is normal in structure. No evidence of mitral valve stenosis. Tricuspid Valve: The tricuspid valve is normal in structure. Tricuspid valve regurgitation is not demonstrated. No evidence of tricuspid  stenosis. Aortic Valve: The aortic valve was not well visualized. Aortic valve regurgitation is not visualized. No aortic stenosis is present. Pulmonic Valve: The pulmonic valve was normal in structure. Pulmonic valve regurgitation is not visualized. No evidence of pulmonic stenosis. Aorta: The aortic root is normal in size and structure. Venous: The inferior vena cava is normal in size with greater than 50% respiratory variability, suggesting right atrial  pressure of 3 mmHg. IAS/Shunts: No atrial level shunt detected by color flow Doppler. LEFT VENTRICLE PLAX 2D LVIDd:         3.95 cm LVIDs:         2.80 cm LV PW:         0.75 cm LV IVS:        0.80 cm  LV Volumes (MOD) LV vol d, MOD A2C: 35.5 ml LV vol d, MOD A4C: 49.9 ml LV vol s, MOD A2C: 15.8 ml LV vol s, MOD A4C: 21.4 ml LV SV MOD A2C:     19.7 ml LV SV MOD A4C:     49.9 ml LV SV MOD BP:      24.2 ml Ida Rogue MD Electronically signed by Ida Rogue MD Signature Date/Time: 10/23/2021/6:11:12 PM    Final    MM Outside Films Mammo  Result Date: 10/08/2021 This examination belongs to an outside facility and is stored here for comparison purposes only.  Contact the originating outside institution for any associated report or interpretation.   ASSESSMENT & PLAN:   Cancer of upper lobe of right lung ([place]) #Stage IV-lung cancer-adenocarcinoma- currently on Alimta-Keytruda maintenance . JAN, 23rd 2023-No significant change in size of the right lung mass, with stable right lower lobe septal thickening and decreased size of the index mediastinal lymph nodes.No evidence of metastatic disease within the abdomen or pelvis; see below.  # Continue with Alimta Keytruda maintenance Labs today reviewed;  acceptable for treatment today.    # CT scan 2023- increase in size of lobular centrally necrotic left axillary lymph nodes ~ 12 mm- DDx: occult left breast Malignancy vs metastatic lung cancer.  February 2023-mammogram negative for any breast lesions; ultrasound-axilla lymph node.  Recommend biopsy.  Left a message for breast center.  # Subcentimeter brain lesions-clinically asymptomatic. Repeat MRI brain- 11/22 Mildly increased size of a left parietal metastasis with increased, mild edema. Decreased size of 2 other metastases. S/p SRS- finished on DEC 23rd, 2022- awaiting 2/15- MRI- Decreased size of left parietal and left frontal metastases. No new lesions.  # Iatrogenic Hypothyrodism [ TSH 86 in [place]- on sythroid 150 mcg/day; awaiting profile today.     # COPD stable-significant provement s/p/bronchodilators/diuretics- STABLE  #A. Fib-amiodarone/Cardizem as per cardiology [hx of pericardial tamponade]-  Not on anti-coagulation; S/p Cards eval in Jan 2023- STABLE.   # Port malfunction: STABLE.    *B12 injection-  # DISPOSITION: # chemo today; # follow up in 3 week- Thursday/Friday;   MD; labs- cbc/cmp;TSH- Alimta-keytruda; Dr.B  # I reviewed the blood work- with the patient in detail; also reviewed the imaging independently [as summarized above]; and with the patient in detail.       All questions were answered. The patient knows to call the clinic with any problems, questions or concerns.    Cammie Sickle, MD 10/25/2021 6:46 PM

## 2021-10-25 NOTE — Progress Notes (Signed)
Pt would like to know if she can eat and take the diltiazem and folic acid, or does she need to have an empty stomach?  Pt states in January she had lt sided chest pain that went away. Just wanted you to be aware.

## 2021-10-25 NOTE — Patient Instructions (Signed)
[facility] AT [place]  Discharge Instructions: Thank you for choosing [place] to provide your oncology and hematology care.  If you have a lab appointment with the [place], please go directly to the [place] and check in at the registration area.  Wear comfortable clothing and clothing appropriate for easy access to any Portacath or PICC line.   We strive to give you quality time with your provider. You may need to reschedule your appointment if you arrive late (15 or more minutes).  Arriving late affects you and other patients whose appointments are after yours.  Also, if you miss three or more appointments without notifying the office, you may be dismissed from the clinic at the providers discretion.      For prescription refill requests, have your pharmacy contact our office and allow 72 hours for refills to be completed.    Today you received the following chemotherapy and/or immunotherapy agents : Keytruda / Alimta    To help prevent nausea and vomiting after your treatment, we encourage you to take your nausea medication as directed.  BELOW ARE SYMPTOMS THAT SHOULD BE REPORTED IMMEDIATELY: *FEVER GREATER THAN 100.4 F (38 C) OR HIGHER *CHILLS OR SWEATING *NAUSEA AND VOMITING THAT IS NOT CONTROLLED WITH YOUR NAUSEA MEDICATION *UNUSUAL SHORTNESS OF BREATH *UNUSUAL BRUISING OR BLEEDING *URINARY PROBLEMS (pain or burning when urinating, or frequent urination) *BOWEL PROBLEMS (unusual diarrhea, constipation, pain near the anus) TENDERNESS IN MOUTH AND THROAT WITH OR WITHOUT PRESENCE OF ULCERS (sore throat, sores in mouth, or a toothache) UNUSUAL RASH, SWELLING OR PAIN  UNUSUAL VAGINAL DISCHARGE OR ITCHING   Items with * indicate a potential emergency and should be followed up as soon as possible or go to the Emergency Department if any problems should occur.  Please show the CHEMOTHERAPY ALERT CARD or IMMUNOTHERAPY ALERT CARD at  check-in to the Emergency Department and triage nurse.  Should you have questions after your visit or need to cancel or reschedule your appointment, please contact [facility] [place] AT [place]  [phone] and follow the prompts.  Office hours are 8:00 a.m. to 4:30 p.m. Monday - Friday. Please note that voicemails left after 4:00 p.m. may not be returned until the following business day.  We are closed weekends and major holidays. You have access to a nurse at all times for urgent questions. Please call the main number to the clinic [phone] and follow the prompts.  For any non-urgent questions, you may also contact your provider using MyChart. We now offer e-Visits for anyone 46 and older to request care online for non-urgent symptoms. For details visit [email or web address].   Also download the MyChart app! Go to the app store, search "MyChart", open the app, select [place], and log in with your MyChart username and password.  Due to Covid, a mask is required upon entering the hospital/clinic. If you do not have a mask, one will be given to you upon arrival. For doctor visits, patients may have 1 support person aged 36 or older with them. For treatment visits, patients cannot have anyone with them due to current Covid guidelines and our immunocompromised population.

## 2021-10-25 NOTE — Assessment & Plan Note (Addendum)
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 JAN, 23rd 2023-No significant change in size of the right lung mass, with stable right lower lobe septal thickening and decreased size of the index mediastinal lymph nodes.No evidence of metastatic disease within the abdomen or pelvis; see below.  # Continue with Alimta Keytruda maintenance Labs today reviewed;  acceptable for treatment today.    # CT scan 2023- increase in size of lobular centrally necrotic left axillary lymph nodes ~ 12 mm- DDx: occult left breast Malignancy vs metastatic lung cancer.  February 2023-mammogram negative for any breast lesions; ultrasound-axilla lymph node.  Recommend biopsy.  Left a message for breast center.  # Subcentimeter brain lesions-clinically asymptomatic. Repeat MRI brain- 11/22 Mildly increased size of a left parietal metastasis with increased, mild edema. Decreased size of 2 other metastases. S/p SRS- finished on DEC 23rd, 2022- awaiting 2/15- MRI- Decreased size of left parietal and left frontal metastases. No new lesions.  # Iatrogenic Hypothyrodism [ TSH 86 in [place]- on sythroid 150 mcg/day; awaiting profile today.   # COPD stable-significant provement s/p/bronchodilators/diuretics- STABLE  #A. Fib-amiodarone/Cardizem as per cardiology [hx of pericardial tamponade]-  Not on anti-coagulation; S/p Cards eval in Jan 2023- STABLE.   # Port malfunction: STABLE.    *B12 injection-  # DISPOSITION: # chemo today; # follow up in 3 week- Thursday/Friday;   MD; labs- cbc/cmp;TSH- Alimta-keytruda; Dr.B  # I reviewed the blood work- with the patient in detail; also reviewed the imaging independently [as summarized above]; and with the patient in detail.

## 2021-10-25 NOTE — Telephone Encounter (Signed)
Left voicemail message to call back for review of results.  

## 2021-10-26 ENCOUNTER — Other Ambulatory Visit: Payer: Self-pay

## 2021-10-26 ENCOUNTER — Telehealth: Payer: Self-pay

## 2021-10-26 DIAGNOSIS — C3411 Malignant neoplasm of upper lobe, right bronchus or lung: Secondary | ICD-10-CM

## 2021-10-26 LAB — TSH: TSH: 22.288 u[IU]/mL — ABNORMAL HIGH (ref 0.350–4.500)

## 2021-10-26 LAB — THYROID PANEL
Free Thyroxine Index: 3.7 (ref 1.2–4.9)
T3 Uptake Ratio: 20 % — ABNORMAL LOW (ref 24–39)
T4, Total: 18.4 ug/dL — ABNORMAL HIGH (ref 4.5–12.0)

## 2021-10-26 NOTE — Telephone Encounter (Signed)
Results reviewed with patient and she verbalized understanding with no further questions at this time.

## 2021-10-26 NOTE — Telephone Encounter (Signed)
Per Dr. B in regards to a mammo result from [place]: "Please check with breast center-if they are going to order the lymph node biopsy at the location; or do I need to order the biopsy for IR +?? Thanks"  I spoke with Melissa at [facility], they placed the order and dr. B signed it. She is sch'd for 10/31/21 at 12:45.

## 2021-10-31 ENCOUNTER — Other Ambulatory Visit: Payer: Self-pay

## 2021-10-31 ENCOUNTER — Ambulatory Visit
Admission: RE | Admit: 2021-10-31 | Discharge: 2021-10-31 | Disposition: A | Discharge status: Home / Self Care | Payer: Medicaid Other | Source: Ambulatory Visit | Attending: Internal Medicine | Admitting: Internal Medicine

## 2021-10-31 DIAGNOSIS — R928 Other abnormal and inconclusive findings on diagnostic imaging of breast: Secondary | ICD-10-CM

## 2021-10-31 DIAGNOSIS — C773 Secondary and unspecified malignant neoplasm of axilla and upper limb lymph nodes: Secondary | ICD-10-CM | POA: Insufficient documentation

## 2021-11-02 ENCOUNTER — Telehealth: Payer: Self-pay | Admitting: *Deleted

## 2021-11-02 LAB — SURGICAL PATHOLOGY

## 2021-11-02 NOTE — Telephone Encounter (Signed)
D[facility] called stating that she checked on status of path done and was told by pathologist that he called and spoke to Dr B about the results being metastatic lung cancer. The report is not in EPIC yet and she states because it is not breast caner, she will not be calling patient with the results and would like for Dr B to inform patient of results

## 2021-11-05 ENCOUNTER — Encounter: Payer: Self-pay | Admitting: Internal Medicine

## 2021-11-05 NOTE — Progress Notes (Signed)
I spoke to patient regarding the results of the biopsy positive for lung cancer. However, given the small lymph note about recommend continued monitoring./Current therapy. Can consider radiation will discuss more at next visit.

## 2021-11-06 NOTE — Telephone Encounter (Signed)
Dr. B made a progress note on 11/05/21 on this.

## 2021-11-14 ENCOUNTER — Other Ambulatory Visit: Payer: Self-pay

## 2021-11-14 DIAGNOSIS — C3411 Malignant neoplasm of upper lobe, right bronchus or lung: Secondary | ICD-10-CM

## 2021-11-15 ENCOUNTER — Other Ambulatory Visit: Payer: Self-pay

## 2021-11-15 ENCOUNTER — Encounter: Payer: Self-pay | Admitting: Internal Medicine

## 2021-11-15 ENCOUNTER — Inpatient Hospital Stay: Payer: Medicaid Other | Attending: Internal Medicine

## 2021-11-15 ENCOUNTER — Inpatient Hospital Stay: Payer: Medicaid Other

## 2021-11-15 ENCOUNTER — Inpatient Hospital Stay (HOSPITAL_BASED_OUTPATIENT_CLINIC_OR_DEPARTMENT_OTHER): Payer: Medicaid Other | Admitting: Internal Medicine

## 2021-11-15 DIAGNOSIS — D72829 Elevated white blood cell count, unspecified: Secondary | ICD-10-CM | POA: Diagnosis not present

## 2021-11-15 DIAGNOSIS — I3131 Malignant pericardial effusion in diseases classified elsewhere: Secondary | ICD-10-CM | POA: Diagnosis not present

## 2021-11-15 DIAGNOSIS — Z5112 Encounter for antineoplastic immunotherapy: Secondary | ICD-10-CM | POA: Insufficient documentation

## 2021-11-15 DIAGNOSIS — C7931 Secondary malignant neoplasm of brain: Secondary | ICD-10-CM | POA: Diagnosis not present

## 2021-11-15 DIAGNOSIS — C3411 Malignant neoplasm of upper lobe, right bronchus or lung: Secondary | ICD-10-CM | POA: Insufficient documentation

## 2021-11-15 DIAGNOSIS — Z79890 Hormone replacement therapy (postmenopausal): Secondary | ICD-10-CM | POA: Diagnosis not present

## 2021-11-15 DIAGNOSIS — I480 Paroxysmal atrial fibrillation: Secondary | ICD-10-CM | POA: Insufficient documentation

## 2021-11-15 DIAGNOSIS — E032 Hypothyroidism due to medicaments and other exogenous substances: Secondary | ICD-10-CM | POA: Diagnosis not present

## 2021-11-15 DIAGNOSIS — J449 Chronic obstructive pulmonary disease, unspecified: Secondary | ICD-10-CM | POA: Diagnosis not present

## 2021-11-15 DIAGNOSIS — Z5111 Encounter for antineoplastic chemotherapy: Secondary | ICD-10-CM | POA: Diagnosis present

## 2021-11-15 LAB — CBC WITH DIFFERENTIAL/PLATELET
Abs Immature Granulocytes: 0.07 10*3/uL (ref 0.00–0.07)
Basophils Absolute: 0.1 10*3/uL (ref 0.0–0.1)
Basophils Relative: 1 %
Eosinophils Absolute: 0.2 10*3/uL (ref 0.0–0.5)
Eosinophils Relative: 1 %
HCT: 37 % (ref 36.0–46.0)
Hemoglobin: 12.3 g/dL (ref 12.0–15.0)
Immature Granulocytes: 1 %
Lymphocytes Relative: 18 %
Lymphs Abs: 2 10*3/uL (ref 0.7–4.0)
MCH: 34.2 pg — ABNORMAL HIGH (ref 26.0–34.0)
MCHC: 33.2 g/dL (ref 30.0–36.0)
MCV: 102.8 fL — ABNORMAL HIGH (ref 80.0–100.0)
Monocytes Absolute: 1 10*3/uL (ref 0.1–1.0)
Monocytes Relative: 10 %
Neutro Abs: 7.6 10*3/uL (ref 1.7–7.7)
Neutrophils Relative %: 69 %
Platelets: 353 10*3/uL (ref 150–400)
RBC: 3.6 MIL/uL — ABNORMAL LOW (ref 3.87–5.11)
RDW: 14.6 % (ref 11.5–15.5)
WBC: 11 10*3/uL — ABNORMAL HIGH (ref 4.0–10.5)
nRBC: 0 % (ref 0.0–0.2)

## 2021-11-15 LAB — COMPREHENSIVE METABOLIC PANEL
ALT: 32 U/L (ref 0–44)
AST: 52 U/L — ABNORMAL HIGH (ref 15–41)
Albumin: 3.7 g/dL (ref 3.5–5.0)
Alkaline Phosphatase: 79 U/L (ref 38–126)
Anion gap: 11 (ref 5–15)
BUN: 12 mg/dL (ref 6–20)
CO2: 24 mmol/L (ref 22–32)
Calcium: 9.4 mg/dL (ref 8.9–10.3)
Chloride: 102 mmol/L (ref 98–111)
Creatinine, Ser: 0.69 mg/dL (ref 0.44–1.00)
GFR, Estimated: >60 mL/min (ref >60)
Glucose, Bld: 186 mg/dL — ABNORMAL HIGH (ref 70–99)
Potassium: 3.7 mmol/L (ref 3.5–5.1)
Sodium: 137 mmol/L (ref 135–145)
Total Bilirubin: 0.5 mg/dL (ref 0.3–1.2)
Total Protein: 8.1 g/dL (ref 6.5–8.1)

## 2021-11-15 LAB — TSH: TSH: 2.32 u[IU]/mL (ref 0.350–4.500)

## 2021-11-15 MED ORDER — SODIUM CHLORIDE 0.9 % IV SOLN
500.0000 mg/m2 | Freq: Once | INTRAVENOUS | Status: AC
  Administered 2021-11-15: 11:00:00 800 mg via INTRAVENOUS
  Filled 2021-11-15: qty 12

## 2021-11-15 MED ORDER — LIDOCAINE-PRILOCAINE 2.5-2.5 % EX CREA: 1.0000 "application " | TOPICAL_CREAM | CUTANEOUS | 3 refills | Status: DC | PRN

## 2021-11-15 MED ORDER — SODIUM CHLORIDE 0.9% FLUSH
10.0000 mL | Freq: Once | INTRAVENOUS | Status: AC
  Administered 2021-11-15: 09:00:00 10 mL via INTRAVENOUS
  Filled 2021-11-15: qty 10

## 2021-11-15 MED ORDER — SODIUM CHLORIDE 0.9 % IV SOLN
Freq: Once | INTRAVENOUS | Status: AC
  Filled 2021-11-15: qty 250

## 2021-11-15 MED ORDER — SODIUM CHLORIDE 0.9 % IV SOLN
200.0000 mg | Freq: Once | INTRAVENOUS | Status: AC
  Administered 2021-11-15: 10:00:00 200 mg via INTRAVENOUS
  Filled 2021-11-15: qty 8

## 2021-11-15 MED ORDER — HEPARIN SOD (PORK) LOCK FLUSH 100 UNIT/ML IV SOLN
500.0000 [IU] | Freq: Once | INTRAVENOUS | Status: AC | PRN
  Administered 2021-11-15: 11:00:00 500 [IU]
  Filled 2021-11-15: qty 5

## 2021-11-15 MED ORDER — LIDOCAINE-PRILOCAINE 2.5-2.5 % EX CREA
1.0000 | TOPICAL_CREAM | CUTANEOUS | 0 refills | Status: DC | PRN
Start: 2021-11-15 — End: 2021-11-15

## 2021-11-15 MED ORDER — PROCHLORPERAZINE MALEATE 10 MG PO TABS
10.0000 mg | ORAL_TABLET | Freq: Once | ORAL | Status: AC
  Administered 2021-11-15: 10:00:00 10 mg via ORAL
  Filled 2021-11-15: qty 1

## 2021-11-15 NOTE — Assessment & Plan Note (Addendum)
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 JAN, 23rd 2023-No significant change in size of the right lung mass, with stable right lower lobe septal thickening and decreased size of the index mediastinal lymph nodes.No evidence of metastatic disease within the abdomen or pelvis; see below. STABLE.  ? ?# Continue with Alimta Keytruda maintenance Labs today reviewed;  acceptable for treatment today.   ? ?# CT scan 2023- increase in size of lobular centrally necrotic left axillary lymph nodes ~ 12 mm-.[facility] 2023-positive for adenocarcinoma of lung origin.  Given the overall stability of the systemic disease I think is reasonable to continue monitor at this time.  However if it starts growing or causing discomfort would recommend local radiation.  Patient agreement. ? ?# Subcentimeter brain lesions-clinically asymptomatic. Repeat MRI brain- 11/22 Mildly increased size of a left parietal metastasis with increased, mild edema. Decreased size of 2 other metastases. S/p SRS- finished on DEC 23rd, 2022-  10/24/2021-- MRI- Decreased size of left parietal and left frontal metastases. No new lesions. ? ?# Iatrogenic Hypothyrodism [TSH 86 in [place]- on sythroid 150 mcg/day; FEB 2023- 23; awaiting TSH from today ? ?? ?# COPD stable-significant provement s/p/bronchodilators/diuretics- STABLE ? ?#A. Fib-amiodarone/Cardizem as per cardiology [hx of pericardial tamponade]-  Not on anti-coagulation; S/p Cards eval in Jan 2023- STABLE.  ? ?# Port malfunction: STABLE.  ? ? *B12 injection- ? ?2/16-B12; ?# DISPOSITION: ?# chemo today; ?# follow up in 3 week- Thursday/Friday;   MD; labs- cbc/cmp;TSH- Alimta-keytruda; Dr.B ? ? ? ?

## 2021-11-15 NOTE — Progress Notes (Signed)
[facility] NOTE  Patient Care Team: [place] as PCP - General End, Harrell Gave, MD as PCP - Cardiology (Cardiology) Telford Nab, RN as Oncology Nurse Navigator  CHIEF COMPLAINTS/PURPOSE OF CONSULTATION: Lung cancer  #  Oncology History Overview Note  # AUG 2022-stage IV adenocarcinoma of the lung [s/p pericardial effusion/tamponade]. MRI Brain AUG 16th, 2022- IMPRESSION: Multiple peripherally enhancing, centrally T1 hypointense lesions, with surrounding T2 hyperintense signal in the bilateral cerebral hemispheres, concerning for metastatic disease.    # AUG 17th 2022- CARBO-ALIMTA-Keytruda #1  # Mammogram- FEB 2023-no lesions noted in the breasts; 1 to 2 cm lymph node left underarm;. LYMPH NODE, LEFT AXILLARY; ULTRASOUND-GUIDED CORE NEEDLE BIOPSY:  - POSITIVE FOR MALIGNANCY.  - METASTATIC ADENOCARCINOMA, COMPATIBLE WITH PULMONARY PRIMARY   # Repeat MRI brain- 11/22 Mildly increased size of a left parietal metastasis with increased, mild edema. Decreased size of 2 other metastases. S/p SRS- finished on DEC 23rd, 2022- MRI FEB 2023-improved brain lesions  # JAN 2023-iatrogenic hypothyroidism- TSH 83; synthroid 128mg  #Acute respiratory failure BiPAP/ICU.  S/p pericardiocentesis.  NGS; PDL-1-0    Malignant pericardial effusion  03/28/2021 Initial Diagnosis   Malignant pericardial effusion ([facility]   04/25/2021 -  Chemotherapy   Patient is on Treatment Plan : LUNG CARBOplatin / Pemetrexed / Pembrolizumab q21d Induction x 4 cycles / Maintenance Pemetrexed + Pembrolizumab     [place]  04/10/2021 Initial Diagnosis   Can[place] of [place] lobe of right lung ([facility]   04/10/2021 Cancer Staging   Staging form: Lung, AJCC 8th Edition - Clinical: Stage IVA (cT2, cN2, cM1a) - Signed by BCammie Sickle MD on 04/10/2021       HISTORY OF PRESENTING ILLNESS: Accompanied by family; patient currently in  wheelchair.  Nichole [id number].[id number].  female  stage IV adenocarcinoma the lung cancer is here currently on chemotherapy-immunotherapy is here for a follow up/review results of left axillary LN Biopsy.   Patient reports occasional shortness of breath on exertion.    At last visit patient was started on Synthroid-for hypothyroidism.  Patient admits to compliance with Synthroid.  Patient denies any further episodes of chest pain.  Patient denies any further episodes of chest pain.  Chronic mild shortness of breath.  No cough.  Review of Systems  Constitutional:  Positive for malaise/fatigue. Negative for chills, diaphoresis and fever.  HENT:  Negative for nosebleeds and sore throat.   Eyes:  Negative for double vision.  Respiratory:  Positive for shortness of breath. Negative for cough, hemoptysis, sputum production and wheezing.   Cardiovascular:  Negative for chest pain, palpitations, orthopnea and leg swelling.  Gastrointestinal:  Negative for abdominal pain, blood in stool, constipation, diarrhea, heartburn, melena, nausea and vomiting.  Genitourinary:  Negative for dysuria, frequency and urgency.  Musculoskeletal:  Positive for myalgias. Negative for back pain and joint pain.  Skin: Negative.  Negative for itching and rash.  Neurological:  Negative for dizziness, tingling, focal weakness, weakness and headaches.  Endo/Heme/Allergies:  Does not bruise/bleed easily.  Psychiatric/Behavioral:  Negative for depression. The patient is not nervous/anxious and does not have insomnia.     MEDICAL HISTORY:  Past Medical History:  Diagnosis Date   Graves disease    Malignant pericardial effusion    [place]oxysmal atrial fibrillation [place]    [place]     SURGICAL HISTORY: Past Surgical History:  Procedure Laterality Date   IR CV LINE INJECTION  06/19/2021   IR IMAGING GUIDED PORT INSERTION  04/27/2021   PERICARDIOCENTESIS N/A 03/27/2021   Procedure:  PERICARDIOCENTESIS;  Surgeon: Nelva Bush, MD;  Location: [facility];  Service: Cardiovascular;  Laterality: N/A;   TUBAL LIGATION      SOCIAL HISTORY: Social History   Socioeconomic History   Marital status: Widowed    Spouse name: Not on file   Number of children: Not on file   Years of education: Not on file   Highest education level: Not on file  Occupational History   Not on file  Tobacco Use   Smoking status: Former    Packs/day: 1.00    Years: 40.00    Pack years: 40.00    Types: Cigarettes   Smokeless tobacco: Never  Vaping Use   Vaping Use: Never used  Substance and Sexual Activity   Alcohol use: No   Drug use: Not Currently    Types: Marijuana   Sexual activity: Not Currently  Other Topics Concern   Not on file  Social History Narrative   Not on file   Social Determinants of Health   Financial Resource Strain: High Risk   Difficulty of Paying Living Expenses: Very hard  Food Insecurity: Not on file  Transportation Needs: Not on file  Physical Activity: Not on file  Stress: Stress Concern Present   Feeling of Stress : Very much  Social Connections: Not on file  Intimate Partner Violence: Not on file    FAMILY HISTORY: Family History  Problem Relation Age of Onset   Peripheral Artery Disease Mother    Breast cancer Neg Hx     ALLERGIES:  is allergic to diazepam, metoprolol, and tapazole [methimazole].  MEDICATIONS:  Current Outpatient Medications  Medication Sig Dispense Refill   bismuth subsalicylate (PEPTO BISMOL) 262 MG/15ML suspension Take 30 mLs by mouth every 6 (six) hours as needed.     diltiazem (DILACOR XR) 240 MG 24 hr capsule Take 1 capsule (240 mg total) by mouth once daily. 90 capsule 3   folic acid (FOLVITE) 1 MG tablet Take 1 tablet (1 mg total) by mouth once daily. 90 tablet 1   levothyroxine (SYNTHROID) 150 MCG tablet Take 1 tablet (150 mcg total) by mouth daily before breakfast. Take on empty  stomach. 30 tablet 2   Multiple Vitamin (MULTI-VITAMINS) TABS Take 1 tablet by mouth daily.     ondansetron (ZOFRAN) 4 MG tablet Take 2 tablets (42m total) by mouth once every 8 hours as needed for nausea and vomiting. 80 tablet 1   TURMERIC PO Take 1 tablet by mouth daily.     lidocaine-prilocaine (EMLA) cream Apply 1 application topically daily as needed. 30 g 3   No current facility-administered medications for this visit.   Facility-Administered Medications Ordered in Other Visits  Medication Dose Route Frequency Provider Last Rate Last Admin   heparin lock flush 100 unit/mL  500 Units Intracatheter Once PRN BCammie Sickle MD        Left axillary lymphadenopathy noted ~1 to 2 cm in size   PHYSICAL EXAMINATION: ECOG PERFORMANCE STATUS: 1 - Symptomatic but completely ambulatory  Vitals:   11/15/21 0900  BP: 109/78  Pulse: 96  Temp: (!) 96.9 F (36.1 C)  SpO2: 96%   Filed Weights   11/15/21 0900  Weight: 142 lb (64.4 kg)    Physical Exam Vitals and nursing note reviewed.  HENT:     Head: Normocephalic and atraumatic.     Mouth/Throat:  Pharynx: Oropharynx is clear.  Eyes:     Extraocular Movements: Extraocular movements intact.     Pupils: Pupils are equal, round, and reactive to light.  Cardiovascular:     Rate and Rhythm: Normal rate and regular rhythm.  Pulmonary:     Comments: Decreased breath sounds bilaterally.  Abdominal:     Palpations: Abdomen is soft.  Musculoskeletal:        General: Normal range of motion.     Cervical back: Normal range of motion.  Skin:    General: Skin is warm.  Neurological:     General: No focal deficit present.     Mental Status: She is alert and oriented to person, place, and time.  Psychiatric:        Behavior: Behavior normal.        Judgment: Judgment normal.     LABORATORY DATA:  I have reviewed the data as listed Lab Results  Component Value Date   WBC 11.0 (H) 11/15/2021   HGB 12.3 11/15/2021    HCT 37.0 11/15/2021   MCV 102.8 (H) 11/15/2021   PLT 353 11/15/2021   Recent Labs    03/27/21 1635 03/28/21 0131 10/03/21 0819 10/25/21 0802 11/15/21 0848  NA  --    < > 137 136 137  K  --    < > 3.7 4.1 3.7  CL  --    < > 100 103 102  CO2  --    < > 26 25 24   GLUCOSE  --    < > 132* 148* 186*  BUN  --    < > 11 13 12   CREATININE  --    < > 0.74 0.68 0.69  CALCIUM  --    < > 9.4 9.4 9.4  GFRNONAA  --    < > >60 >60 >60  PROT 7.7   < > 7.7 8.6* 8.1  ALBUMIN 3.2*   < > 3.8 3.7 3.7  AST 31   < > 61* 59* 52*  ALT 21   < > 46* 40 32  ALKPHOS 74   < > 84 86 79  BILITOT 1.2   < > 0.5 0.4 0.5  BILIDIR 0.4*  --   --   --   --   IBILI 0.8  --   --   --   --    < > = values in this interval not displayed.    RADIOGRAPHIC STUDIES: I have personally reviewed the radiological images as listed and agreed with the findings in the report. MR Brain W Wo Contrast  Result Date: 10/25/2021 CLINICAL DATA:  Lung carcinoma with brain metastases. Follow-up after radiation treatment. EXAM: MRI HEAD WITHOUT AND WITH CONTRAST TECHNIQUE: Multiplanar, multiecho pulse sequences of the brain and surrounding structures were obtained without and with intravenous contrast. CONTRAST:  79m GADAVIST GADOBUTROL 1 MMOL/ML IV SOLN COMPARISON:  08/22/2021 FINDINGS: Brain: No acute infarct, mass effect or extra-axial collection. No acute or chronic hemorrhage. The left parietal lesion has decreased in size and now measures 7 mm (series 18, image 108). Punctate anterior left frontal lesion (image 120) is also smaller. The other small left frontal lesion has resolved. No new lesions. The midline structures are normal. Vascular: Major flow voids are preserved. Skull and upper cervical spine: Normal calvarium and skull base. Visualized upper cervical spine and soft tissues are normal. Sinuses/Orbits:No paranasal sinus fluid levels or advanced mucosal thickening. No mastoid or middle ear effusion. Normal orbits. IMPRESSION:  Decreased size of left parietal and left frontal metastases. No new lesions. Electronically Signed   By: Ulyses Jarred M.D.   On: 10/25/2021 02:32   US Breast Limited Uni Left Inc Axilla  Result Date: 10/18/2021 CLINICAL DATA:  61 year old female presenting for evaluation of enlarged left axillary lymph nodes seen on recent CT. Patient has a history of metastatic non-small cell lung cancer. EXAM: DIGITAL DIAGNOSTIC BILATERAL MAMMOGRAM WITH TOMOSYNTHESIS AND CAD; ULTRASOUND LEFT BREAST LIMITED TECHNIQUE: Bilateral digital diagnostic mammography and breast tomosynthesis was performed. The images were evaluated with computer-aided detection.; Targeted ultrasound examination of the left breast was performed. COMPARISON:  Previous exam(s). ACR Breast Density Category b: There are scattered areas of fibroglandular density. FINDINGS: Mammogram: Right breast: No suspicious mass, distortion, or microcalcifications are identified to suggest presence of malignancy. Left breast: No suspicious mass, distortion, or microcalcifications are identified to suggest presence of malignancy in the left breast. A skin BB marks the site of tenderness reported by the patient in the left axilla. There are several enlarged lymph nodes visualized in the left axilla. Ultrasound: Targeted ultrasound performed in the left axilla demonstrating at least 3 lymph nodes with cortical thickening measuring up to 0.5 cm. IMPRESSION: 1. Left axillary adenopathy of uncertain etiology, possibly related to metastatic lung cancer. There is no suspicious finding in the left breast. 2.  No mammographic evidence of malignancy in the right breast. RECOMMENDATION: 1. Ultrasound-guided core needle biopsy of a left axillary lymph node. 2. If the biopsy returns with metastatic pathology of breast origin recommend breast MRI for further evaluation. I have discussed the findings and recommendations with the patient. The patient will be contacted by our scheduler to  arrange the biopsy appointment. BI-RADS CATEGORY  4: Suspicious. Electronically Signed   By: Audie Pinto M.D.   On: 10/18/2021 12:29  MM DIAG BREAST TOMO BILATERAL  Result Date: 10/18/2021 CLINICAL DATA:  61 year old female presenting for evaluation of enlarged left axillary lymph nodes seen on recent CT. Patient has a history of metastatic non-small cell lung cancer. EXAM: DIGITAL DIAGNOSTIC BILATERAL MAMMOGRAM WITH TOMOSYNTHESIS AND CAD; ULTRASOUND LEFT BREAST LIMITED TECHNIQUE: Bilateral digital diagnostic mammography and breast tomosynthesis was performed. The images were evaluated with computer-aided detection.; Targeted ultrasound examination of the left breast was performed. COMPARISON:  Previous exam(s). ACR Breast Density Category b: There are scattered areas of fibroglandular density. FINDINGS: Mammogram: Right breast: No suspicious mass, distortion, or microcalcifications are identified to suggest presence of malignancy. Left breast: No suspicious mass, distortion, or microcalcifications are identified to suggest presence of malignancy in the left breast. A skin BB marks the site of tenderness reported by the patient in the left axilla. There are several enlarged lymph nodes visualized in the left axilla. Ultrasound: Targeted ultrasound performed in the left axilla demonstrating at least 3 lymph nodes with cortical thickening measuring up to 0.5 cm. IMPRESSION: 1. Left axillary adenopathy of uncertain etiology, possibly related to metastatic lung cancer. There is no suspicious finding in the left breast. 2.  No mammographic evidence of malignancy in the right breast. RECOMMENDATION: 1. Ultrasound-guided core needle biopsy of a left axillary lymph node. 2. If the biopsy returns with metastatic pathology of breast origin recommend breast MRI for further evaluation. I have discussed the findings and recommendations with the patient. The patient will be contacted by our scheduler to arrange the  biopsy appointment. BI-RADS CATEGORY  4: Suspicious. Electronically Signed   By: Audie Pinto M.D.   On: 10/18/2021 12:29  [place] AXILLARY  NODE CORE BIOPSY LEFT  Addendum Date: 11/06/2021   ADDENDUM REPORT: 11/06/2021 07:52 ADDENDUM: PATHOLOGY revealed: A. LYMPH NODE, LEFT AXILLARY; ULTRASOUND-GUIDED CORE NEEDLE BIOPSY: - POSITIVE FOR MALIGNANCY. - METASTATIC ADENOCARCINOMA, COMPATIBLE WITH PULMONARY PRIMARY. Pathology results are CONCORDANT with imaging findings, per Dr. Dorise Bullion. Multiple attempts to contact patient were unsuccessful. Notified patient's provider office and spoke with Hassan Rowan on 11/02/2021, who will contact patient with biopsy results and follow-up appointment with provider. RECOMMENDATION: Patient to follow-up with provider/oncologist (Dr. Charlaine Dalton) for current treatment of known pulmonary carcinoma. Pathology results reported by Electa Sniff RN on 11/05/2021. Electronically Signed   By: Dorise Bullion III M.D.   On: 11/06/2021 07:52   Addendum Date: 11/01/2021   ADDENDUM REPORT: 11/01/2021 08:53 ADDENDUM: A mammogram was not performed after clip placement. Electronically Signed   By: Dorise Bullion III M.D.   On: 11/01/2021 08:53   Result Date: 11/06/2021 CLINICAL DATA:  Left axillary lymph node biopsy EXAM: [place] AXILLARY NODE CORE BIOPSY LEFT COMPARISON:  Previous exam(s). PROCEDURE: I met with the patient and we discussed the procedure of ultrasound-guided biopsy, including benefits and alternatives. We discussed the high likelihood of a successful procedure. We discussed the risks of the procedure, including infection, bleeding, tissue injury, clip migration, and inadequate sampling. Informed written consent was given. The usual time-out protocol was performed immediately prior to the procedure. Using sterile technique and 1% Lidocaine as local anesthetic, under direct ultrasound visualization, a 14 gauge spring-loaded device was used to perform biopsy of a left axillary  lymph node using a lateral approach. At the conclusion of the procedure a HydroMARK tissue marker clip was deployed into the biopsy cavity. Follow up 2 view mammogram was performed and dictated separately. IMPRESSION: Ultrasound guided biopsy of a left axillary lymph node. No apparent complications. Electronically Signed: By: Dorise Bullion III M.D. On: 10/31/2021 13:49   ECHOCARDIOGRAM LIMITED  Result Date: 10/23/2021    ECHOCARDIOGRAM LIMITED REPORT   Patient Name:   Nichole Park Date of Exam: 10/23/2021 Medical Rec #:  [id number]   Height:       61.5 in Accession #:    [id number]  Weight:       142.6 lb Date of Birth:  June 14, 1961  BSA:          1.646 m Patient Age:    3 years    BP:           109/78 mmHg Patient Gender: F           HR:           92 bpm. Exam Location:  [facility] Procedure: Limited Echo and Limited Color Doppler Indications:    I31.3 Pericardial effusion (noninflammatory)  History:        Patient has prior history of Echocardiogram examinations, most                 recent 06/21/2021. Pericardial Disease, COPD, Arrythmias:Atrial                 Fibrillation and Tachycardia, Signs/Symptoms:Shortness of Breath                 and Chest Pain; Risk Factors:Hypertension and Former Smoker.                 Patient experienced chest pain at the beginning of January,                 2023. It lasted 1 hr and has not  reoccurred. Since that event                 she hasa felt much more fatigued.  Sonographer:    Pilar Jarvis RDMS, RVT, RDCS Referring Phys: [id number] [place]  1. Left ventricular ejection fraction, by estimation, is 60 to 65%. The left ventricle has normal function. The left ventricle has no regional wall motion abnormalities.  2. Right ventricular systolic function is normal. The right ventricular size is normal.  3. The mitral valve is normal in structure. No evidence of mitral valve regurgitation. No evidence of mitral stenosis.  4. The aortic valve was not well visualized.  Aortic valve regurgitation is not visualized. No aortic stenosis is present.  5. The inferior vena cava is normal in size with greater than 50% respiratory variability, suggesting right atrial pressure of 3 mmHg. FINDINGS  Left Ventricle: Left ventricular ejection fraction, by estimation, is 60 to 65%. The left ventricle has normal function. The left ventricle has no regional wall motion abnormalities. The left ventricular internal cavity size was normal in size. There is  no left ventricular hypertrophy. Right Ventricle: The right ventricular size is normal. No increase in right ventricular wall thickness. Right ventricular systolic function is normal. Left Atrium: Left atrial size was normal in size. Right Atrium: Right atrial size was normal in size. Pericardium: Trivial pericardial effusion is present. Mitral Valve: The mitral valve is normal in structure. No evidence of mitral valve stenosis. Tricuspid Valve: The tricuspid valve is normal in structure. Tricuspid valve regurgitation is not demonstrated. No evidence of tricuspid stenosis. Aortic Valve: The aortic valve was not well visualized. Aortic valve regurgitation is not visualized. No aortic stenosis is present. Pulmonic Valve: The pulmonic valve was normal in structure. Pulmonic valve regurgitation is not visualized. No evidence of pulmonic stenosis. Aorta: The aortic root is normal in size and structure. Venous: The inferior vena cava is normal in size with greater than 50% respiratory variability, suggesting right atrial pressure of 3 mmHg. IAS/Shunts: No atrial level shunt detected by color flow Doppler. LEFT VENTRICLE PLAX 2D LVIDd:         3.95 cm LVIDs:         2.80 cm LV PW:         0.75 cm LV IVS:        0.80 cm  LV Volumes (MOD) LV vol d, MOD A2C: 35.5 ml LV vol d, MOD A4C: 49.9 ml LV vol s, MOD A2C: 15.8 ml LV vol s, MOD A4C: 21.4 ml LV SV MOD A2C:     19.7 ml LV SV MOD A4C:     49.9 ml LV SV MOD BP:      24.2 ml Ida Rogue MD Electronically  signed by Ida Rogue MD Signature Date/Time: 10/23/2021/6:11:12 PM    Final     ASSESSMENT & PLAN:   Cancer of upper lobe of right lung ([place]) #Stage IV-lung cancer-adenocarcinoma- currently on Alimta-Keytruda maintenance . JAN, 23rd 2023-No significant change in size of the right lung mass, with stable right lower lobe septal thickening and decreased size of the index mediastinal lymph nodes.No evidence of metastatic disease within the abdomen or pelvis; see below. STABLE.   # Continue with Alimta Keytruda maintenance Labs today reviewed;  acceptable for treatment today.    # CT scan 2023- increase in size of lobular centrally necrotic left axillary lymph nodes ~ 12 mm-.[facility] for adenocarcinoma of lung origin.  Given the overall stability of  the systemic disease I think is reasonable to continue monitor at this time.  However if it starts growing or causing discomfort would recommend local radiation.  Patient agreement.  # Subcentimeter brain lesions-clinically asymptomatic. Repeat MRI brain- 11/22 Mildly increased size of a left parietal metastasis with increased, mild edema. Decreased size of 2 other metastases. S/p SRS- finished on DEC 23rd, 2022-  10/24/2021-- MRI- Decreased size of left parietal and left frontal metastases. No new lesions.  # Iatrogenic Hypothyrodism [TSH 86 in JAN 2023]- on sythroid 150 mcg/day; FEB 2023- 23; awaiting TSH from today     # COPD stable-significant provement s/p/bronchodilators/diuretics- STABLE  #A. Fib-amiodarone/Cardizem as per cardiology [hx of pericardial tamponade]-  Not on anti-coagulation; S/p Cards eval in Jan 2023- STABLE.   # Port malfunction: STABLE.    *B12 injection-  2/16-B12; # DISPOSITION: # chemo today; # follow up in 3 week- Thursday/Friday;   MD; labs- cbc/cmp;TSH- Alimta-keytruda; Dr.B        All questions were answered. The patient knows to call the clinic with any problems, questions or concerns.     Cammie Sickle, MD 11/15/2021 12:11 PM

## 2021-11-15 NOTE — Progress Notes (Signed)
Patient reports occasional shortness of breath on exertion.   ?

## 2021-11-26 NOTE — Progress Notes (Signed)
? ?Cardiology Office Note   ? ?Date:  11/28/2021  ? ?ID:  CHERIA SADIQ, DOB [birth year], MRN [id number] ? ?PCP:  [facility]  ?Cardiologist:  Nelva Bush, MD  ?Electrophysiologist:  None  ? ?Chief Complaint: Follow up ? ?History of Present Illness:  ? ?Nichole Park is a 61 y.o. 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 ?  ?She was admitted in 03/2021 with a pericardial effusion with evidence of cardiac tamponade and underwent emergent pericardiocentesis with removal of 500 mL of serosanguineous fluid.  Echo the following day demonstrated an EF of 55%, no regional wall motion abnormalities, grade 2 diastolic dysfunction, normal RV systolic function and ventricular cavity size, no significant valvular abnormalities, and a trivial pericardial effusion.  Most recent echo from 06/2021 demonstrated an EF of 60 to 65%, no regional wall motion abnormalities, normal RV systolic function and ventricular cavity size, normal PASP, and a trivial pericardial effusion with previously noted echogenic material in the pericardial space essentially resolved. ?  ?She was seen in the office on 07/04/2021 and was feeling fairly well with intermittent fatigue that was most pronounced while undergoing chemotherapy.  She was maintaining sinus rhythm without symptoms suggestive of recurrence of A. fib.  Amiodarone was discontinued.  [place] continued to be deferred in the context of a CHA2DS2-VASc of 1 (sex category), and in the setting of elevated bleeding risk with history of malignant pericardial effusion/metastatic lung cancer. ? ?She was last seen in the office in 09/2021 and was doing well from a cardiac perspective, without symptoms of angina or  decompensation.  She did note an episode of tachypalpitations in 07/2021 that she attributed to reinitiation of caffeine.  With discontinuation of caffeine, she remained without further palpitations.  She felt like her weight gain was related to steroid use.  She was not closely monitoring her salt intake.  Repeat limited echo on 10/23/2021 demonstrated an EF of 60 to 65%, no regional wall motion abnormalities, normal RV systolic function and ventricular cavity size, no significant valvular abnormalities, and estimated right atrial pressure of 3 mmHg, and a stable trivial pericardial effusion. ? ?She comes in accompanied by her boyfriend today and is doing very well from a cardiac perspective.  No chest pain, dyspnea, dizziness, presyncope, syncope, or lower extremity swelling.  No orthopnea or early satiety.  No further palpitations.  She does continue to note generalized fatigue.  Otherwise, she does not have any active issues or concerns at this time. ? ? ?Labs independently reviewed: ?11/2021 - Hgb 12.3, PLT 353, potassium 3.7, BUN 12, serum creatinine 0.69, albumin 3.7, AST 52, ALT normal, TSH normal ?04/2021 - magnesium 2.7 ?03/2021 - A1c 6.2 ? ?Past Medical History:  ?Diagnosis Date  ? Graves disease   ? Malignant pericardial effusion   ? Paroxysmal atrial fibrillation (HCC)   ? Primary lung adenocarcinoma ([place])   ? ? ?Past Surgical History:  ?Procedure Laterality Date  ? IR CV LINE INJECTION  06/19/2021  ? IR IMAGING GUIDED PORT INSERTION  04/27/2021  ? PERICARDIOCENTESIS N/A 03/27/2021  ? Procedure: PERICARDIOCENTESIS;  Surgeon: Nelva Bush, MD;  Location: [place] [facility];  Service: Cardiovascular;  Laterality: N/A;  ? TUBAL LIGATION    ? ? ?Current Medications: ?Current Meds  ?Medication Sig  ? bismuth subsalicylate (PEPTO  BISMOL) 262 MG/15ML suspension Take 30 mLs by mouth every 6 (six) hours as needed.  ? diltiazem (CARDIZEM CD) 180 MG 24 hr capsule Take 1 capsule (180 mg total) by mouth daily.  ?  folic acid (FOLVITE) 1 MG tablet Take 1 tablet (1 mg total) by mouth once daily.  ? levothyroxine (SYNTHROID) 150 MCG tablet Take 1 tablet (150 mcg total) by mouth daily before breakfast. Take on empty stomach.  ? lidocaine-prilocaine (EMLA) cream Apply 1 application topically daily as needed.  ? Multiple Vitamin (MULTI-VITAMINS) TABS Take 1 tablet by mouth daily.  ? ondansetron (ZOFRAN) 4 MG tablet Take 2 tablets (8mg  total) by mouth once every 8 hours as needed for nausea and vomiting.  ? TURMERIC PO Take 1 tablet by mouth daily.  ? ? ?Allergies:   Diazepam, Metoprolol, and Tapazole [methimazole]  ? ?Social History  ? ?Socioeconomic History  ? Marital status: Widowed  ?  Spouse name: Not on file  ? Number of children: Not on file  ? Years of education: Not on file  ? Highest education level: Not on file  ?Occupational History  ? Not on file  ?Tobacco Use  ? Smoking status: Former  ?  Packs/day: 1.00  ?  Years: 40.00  ?  Pack years: 40.00  ?  Types: Cigarettes  ? Smokeless tobacco: Never  ?Vaping Use  ? Vaping Use: Never used  ?Substance and Sexual Activity  ? Alcohol use: No  ? Drug use: Not Currently  ?  Types: Marijuana  ? Sexual activity: Not Currently  ?Other Topics Concern  ? Not on file  ?Social History Narrative  ? Not on file  ? ?Social Determinants of Health  ? ?Financial Resource Strain: High Risk  ? Difficulty of Paying Living Expenses: Very hard  ?Food Insecurity: Not on file  ?Transportation Needs: Not on file  ?Physical Activity: Not on file  ?Stress: Stress Concern Present  ? Feeling of Stress : Very much  ?Social Connections: Not on file  ?  ? ?Family History:  ?The patient's family history includes Peripheral Artery Disease in her mother. There is no history of Breast cancer. ? ?ROS:   ?Review of Systems  ?Constitutional:  Positive for malaise/fatigue. Negative for chills, diaphoresis, fever and weight loss.  ?HENT:  Negative for congestion.   ?Eyes:  Negative for discharge and redness.   ?Respiratory:  Negative for cough, sputum production, shortness of breath and wheezing.   ?Cardiovascular:  Negative for chest pain, palpitations, orthopnea, claudication, leg swelling and PND.  ?Gastrointestinal:  Negative for abdominal pain, heartburn, nausea and vomiting.  ?Musculoskeletal:  Negative for falls and myalgias.  ?Skin:  Negative for rash.  ?Neurological:  Negative for dizziness, tingling, tremors, sensory change, speech change, focal weakness, loss of consciousness and weakness.  ?Endo/Heme/Allergies:  Does not bruise/bleed easily.  ?Psychiatric/Behavioral:  Negative for substance abuse. The patient is not nervous/anxious.   ?All other systems reviewed and are negative. ? ? ?EKGs/Labs/Other Studies Reviewed:   ? ?Studies reviewed were summarized above. The additional studies were reviewed today: ? ?Pericardiocentesis 03/27/2021: ?Conclusions: ?Successful pericardiocentesis and tube pericardiotomy via the subxiphoid approach, yielding 500 mL of serosanginous fluid. ?  ?Recommendations: ?Admit to ICU. ?Obtain STAT portable CXR. ?Follow-up fluid analyses, including cytology. ?Repeat echocardiogram tomorrow.  Anticipate removal of drain when output is less than 50-100 mL/24 hours and minimal residual fluid is present by echo. ?__________ ?  ?2D echo 03/28/2021: ?1. Left ventricular ejection fraction, by estimation, is 55%. The left  ?  ventricle has normal function. The left ventricle has no regional wall  ?motion abnormalities. Left ventricular diastolic parameters are consistent  ?with Grade II diastolic dysfunction  ?(pseudonormalization).  ? 2. Right ventricular systolic function is normal. The right ventricular  ?size is normal.  ? 3. The mitral valve is normal in structure. No evidence of mitral valve  ?regurgitation.  ? 4. The aortic valve was not well visualized. Aortic valve regurgitation  ?is not visualized.  ? 5. The inferior vena cava is dilated in size with >50% respiratory  ?variability,  suggesting right atrial pressure of 8 mmHg. ?__________ ?  ?Limited echo 03/29/2021: ?1. Trvial pericardial effusion.  ? 2. Left ventricular ejection fraction, by estimation, is 60 to 65%. The  ?left ventricle has normal functi

## 2021-11-28 ENCOUNTER — Encounter: Payer: Self-pay | Admitting: Physician Assistant

## 2021-11-28 ENCOUNTER — Ambulatory Visit (INDEPENDENT_AMBULATORY_CARE_PROVIDER_SITE_OTHER): Payer: Medicaid Other | Admitting: Physician Assistant

## 2021-11-28 ENCOUNTER — Other Ambulatory Visit: Payer: Self-pay

## 2021-11-28 VITALS — BP 110/64 | HR 86 | Ht 61.5 in | Wt 143.1 lb

## 2021-11-28 DIAGNOSIS — R5383 Other fatigue: Secondary | ICD-10-CM

## 2021-11-28 DIAGNOSIS — I480 Paroxysmal atrial fibrillation: Secondary | ICD-10-CM

## 2021-11-28 DIAGNOSIS — I3131 Malignant pericardial effusion in diseases classified elsewhere: Secondary | ICD-10-CM | POA: Diagnosis not present

## 2021-11-28 DIAGNOSIS — C7800 Secondary malignant neoplasm of unspecified lung: Secondary | ICD-10-CM

## 2021-11-28 MED ORDER — DILTIAZEM HCL ER COATED BEADS 180 MG PO CP24: 180.0000 mg | ORAL_CAPSULE | Freq: Every day | ORAL | 3 refills | Status: AC

## 2021-11-28 NOTE — Patient Instructions (Signed)
Medication Instructions:  ?Your physician has recommended you make the following change in your medication:  ? ?DECREASE Diltiazem to 180 mg once daily. ? ?*If you need a refill on your cardiac medications before your next appointment, please call your pharmacy* ? ? ?Lab Work: ?None ? ?If you have labs (blood work) drawn today and your tests are completely normal, you will receive your results only by: ?MyChart Message (if you have MyChart) OR ?A paper copy in the mail ?If you have any lab test that is abnormal or we need to change your treatment, we will call you to review the results. ? ? ?Testing/Procedures: ?Your physician has requested that you have a limited echocardiogram in July 2023.  ? ?Echocardiography is a painless test that uses sound waves to create images of your heart. It provides your doctor with information about the size and shape of your heart and how well your heart?s chambers and valves are working. This procedure takes approximately one hour. There are no restrictions for this procedure. ? ? ? ?Follow-Up: ?At [facility], you and your health needs are our priority.  As part of our continuing mission to provide you with exceptional heart care, we have created designated Provider Care Teams.  These Care Teams include your primary Cardiologist (physician) and Advanced Practice Providers (APPs -  Physician Assistants and Nurse Practitioners) who all work together to provide you with the care you need, when you need it. ? ? ?Your next appointment:   ?5 month(s) ? ?The format for your next appointment:   ?In Person ? ?Provider:   ?Nelva Bush, MD or Christell Faith, PA-C ?

## 2021-12-07 ENCOUNTER — Encounter: Payer: Self-pay | Admitting: Oncology

## 2021-12-07 ENCOUNTER — Inpatient Hospital Stay: Payer: Medicaid Other

## 2021-12-07 ENCOUNTER — Inpatient Hospital Stay (HOSPITAL_BASED_OUTPATIENT_CLINIC_OR_DEPARTMENT_OTHER): Payer: Medicaid Other | Admitting: Oncology

## 2021-12-07 VITALS — BP 130/83 | HR 105 | Temp 96.7°F | Resp 19 | Wt 141.8 lb

## 2021-12-07 DIAGNOSIS — C3411 Malignant neoplasm of upper lobe, right bronchus or lung: Secondary | ICD-10-CM

## 2021-12-07 DIAGNOSIS — Z5112 Encounter for antineoplastic immunotherapy: Secondary | ICD-10-CM | POA: Diagnosis not present

## 2021-12-07 DIAGNOSIS — I3131 Malignant pericardial effusion in diseases classified elsewhere: Secondary | ICD-10-CM

## 2021-12-07 LAB — CBC WITH DIFFERENTIAL/PLATELET
Abs Immature Granulocytes: 0.07 10*3/uL (ref 0.00–0.07)
Basophils Absolute: 0.1 10*3/uL (ref 0.0–0.1)
Basophils Relative: 1 %
Eosinophils Absolute: 0.2 10*3/uL (ref 0.0–0.5)
Eosinophils Relative: 2 %
HCT: 38.1 % (ref 36.0–46.0)
Hemoglobin: 12.6 g/dL (ref 12.0–15.0)
Immature Granulocytes: 1 %
Lymphocytes Relative: 21 %
Lymphs Abs: 2.2 10*3/uL (ref 0.7–4.0)
MCH: 34.1 pg — ABNORMAL HIGH (ref 26.0–34.0)
MCHC: 33.1 g/dL (ref 30.0–36.0)
MCV: 103.3 fL — ABNORMAL HIGH (ref 80.0–100.0)
Monocytes Absolute: 0.9 10*3/uL (ref 0.1–1.0)
Monocytes Relative: 9 %
Neutro Abs: 7 10*3/uL (ref 1.7–7.7)
Neutrophils Relative %: 66 %
Platelets: 348 10*3/uL (ref 150–400)
RBC: 3.69 MIL/uL — ABNORMAL LOW (ref 3.87–5.11)
RDW: 13.4 % (ref 11.5–15.5)
WBC: 10.6 10*3/uL — ABNORMAL HIGH (ref 4.0–10.5)
nRBC: 0 % (ref 0.0–0.2)

## 2021-12-07 LAB — COMPREHENSIVE METABOLIC PANEL
ALT: 30 U/L (ref 0–44)
AST: 45 U/L — ABNORMAL HIGH (ref 15–41)
Albumin: 3.6 g/dL (ref 3.5–5.0)
Alkaline Phosphatase: 83 U/L (ref 38–126)
Anion gap: 7 (ref 5–15)
BUN: 10 mg/dL (ref 6–20)
CO2: 26 mmol/L (ref 22–32)
Calcium: 9.5 mg/dL (ref 8.9–10.3)
Chloride: 103 mmol/L (ref 98–111)
Creatinine, Ser: 0.61 mg/dL (ref 0.44–1.00)
GFR, Estimated: >60 mL/min (ref >60)
Glucose, Bld: 166 mg/dL — ABNORMAL HIGH (ref 70–99)
Potassium: 3.8 mmol/L (ref 3.5–5.1)
Sodium: 136 mmol/L (ref 135–145)
Total Bilirubin: 0.3 mg/dL (ref 0.3–1.2)
Total Protein: 8 g/dL (ref 6.5–8.1)

## 2021-12-07 MED ORDER — HEPARIN SOD (PORK) LOCK FLUSH 100 UNIT/ML IV SOLN
500.0000 [IU] | Freq: Once | INTRAVENOUS | Status: AC
  Administered 2021-12-07: 500 [IU] via INTRAVENOUS
  Filled 2021-12-07: qty 5

## 2021-12-07 MED ORDER — SODIUM CHLORIDE 0.9% FLUSH
10.0000 mL | Freq: Once | INTRAVENOUS | Status: AC
  Administered 2021-12-07: 10 mL via INTRAVENOUS
  Filled 2021-12-07: qty 10

## 2021-12-07 MED ORDER — HEPARIN SOD (PORK) LOCK FLUSH 100 UNIT/ML IV SOLN
500.0000 [IU] | Freq: Once | INTRAVENOUS | Status: DC | PRN
  Filled 2021-12-07: qty 5

## 2021-12-07 MED ORDER — PROCHLORPERAZINE MALEATE 10 MG PO TABS
10.0000 mg | ORAL_TABLET | Freq: Once | ORAL | Status: AC
  Administered 2021-12-07: 10 mg via ORAL
  Filled 2021-12-07: qty 1

## 2021-12-07 MED ORDER — SODIUM CHLORIDE 0.9 % IV SOLN
200.0000 mg | Freq: Once | INTRAVENOUS | Status: AC
  Administered 2021-12-07: 200 mg via INTRAVENOUS
  Filled 2021-12-07: qty 8

## 2021-12-07 MED ORDER — HEPARIN SOD (PORK) LOCK FLUSH 100 UNIT/ML IV SOLN
INTRAVENOUS | Status: AC
  Filled 2021-12-07: qty 5

## 2021-12-07 MED ORDER — SODIUM CHLORIDE 0.9 % IV SOLN
500.0000 mg/m2 | Freq: Once | INTRAVENOUS | Status: AC
  Administered 2021-12-07: 800 mg via INTRAVENOUS
  Filled 2021-12-07: qty 20

## 2021-12-07 MED ORDER — SODIUM CHLORIDE 0.9 % IV SOLN
Freq: Once | INTRAVENOUS | Status: AC
  Filled 2021-12-07: qty 250

## 2021-12-07 NOTE — Progress Notes (Signed)
Having soreness in left axillary from biopsy. Nerve pain down her arm intermittently. No other concerns. Appetite is good. Denies headache or dyspnea. ?

## 2021-12-07 NOTE — Progress Notes (Signed)
HR 105 today. Per Dr Grayland Ormond proceed with treatment today ?

## 2021-12-07 NOTE — Progress Notes (Signed)
?[place]  ?[phone] [phone] ? ?ID: Donovan Kail OB: 05-Dec-1960  MR#: [id number]  HGD#:[id number] ? ?Patient Care Team: ?[place] as PCP - General ?End, Harrell Gave, MD as PCP - Cardiology (Cardiology) ?Telford Nab, RN as Sales executive ? ?CHIEF COMPLAINT: Stage IV adenocarcinoma of the lung ? ?INTERVAL HISTORY: Patient returns to clinic today for further evaluation and consideration of cycle 11 of Keytruda and pemetrexed.  She is tolerating her treatments well without significant side effects.  Her only complaint is continued residual tenderness from her axillary biopsy approximately 1 month ago.  She otherwise feels well.  She has no neurologic complaints.  She denies any recent fevers or illnesses.  She has a good appetite and denies weight loss.  She has no chest pain, shortness of breath, cough, or hemoptysis.  She denies any nausea, vomiting, constipation, or diarrhea.  She has no urinary complaints.  Patient offers no further specific complaints today. ? ?REVIEW OF SYSTEMS:   ?Review of Systems  ?Constitutional: Negative.  Negative for fever, malaise/fatigue and weight loss.  ?Respiratory: Negative.  Negative for cough and shortness of breath.   ?Cardiovascular: Negative.  Negative for chest pain and leg swelling.  ?Gastrointestinal: Negative.  Negative for abdominal pain.  ?Genitourinary: Negative.  Negative for dysuria.  ?Musculoskeletal: Negative.  Negative for back pain.  ?Skin: Negative.  Negative for rash.  ?Neurological: Negative.  Negative for dizziness, focal weakness, weakness and headaches.  ?Psychiatric/Behavioral: Negative.  The patient is not nervous/anxious.   ? ?As per HPI. Otherwise, a complete review of systems is negative. ? ?PAST MEDICAL HISTORY: ?Past Medical History:  ?Diagnosis Date  ? Graves disease   ? Malignant pericardial effusion   ? Paroxysmal atrial fibrillation (HCC)   ? Primary lung adenocarcinoma  ([place])   ? ? ?PAST SURGICAL HISTORY: ?Past Surgical History:  ?Procedure Laterality Date  ? IR CV LINE INJECTION  06/19/2021  ? IR IMAGING GUIDED PORT INSERTION  04/27/2021  ? PERICARDIOCENTESIS N/A 03/27/2021  ? Procedure: PERICARDIOCENTESIS;  Surgeon: Nelva Bush, MD;  Location: [place] [facility];  Service: Cardiovascular;  Laterality: N/A;  ? TUBAL LIGATION    ? ? ?FAMILY HISTORY: ?Family History  ?Problem Relation Age of Onset  ? Peripheral Artery Disease Mother   ? Breast cancer Neg Hx   ? ? ?ADVANCED DIRECTIVES (Y/N):  N ? ?HEALTH MAINTENANCE: ?Social History  ? ?Tobacco Use  ? Smoking status: Former  ?  Packs/day: 1.00  ?  Years: 40.00  ?  Pack years: 40.00  ?  Types: Cigarettes  ? Smokeless tobacco: Never  ?Vaping Use  ? Vaping Use: Never used  ?Substance Use Topics  ? Alcohol use: No  ? Drug use: Not Currently  ?  Types: Marijuana  ? ? ? Colonoscopy: ? PAP: ? Bone density: ? Lipid panel: ? ?Allergies  ?Allergen Reactions  ? Diazepam Other (See Comments)  ?  Very high emotionally and very low emotionally, hyperventilate and passes out ?Very high emotionally and very low emotionally, hyperventilate and passes out ?  ? Metoprolol   ? Tapazole [Methimazole]   ? ? ?Current Outpatient Medications  ?Medication Sig Dispense Refill  ? bismuth subsalicylate (PEPTO BISMOL) 262 MG/15ML suspension Take 30 mLs by mouth every 6 (six) hours as needed.    ? diltiazem (CARDIZEM CD) 180 MG 24 hr capsule Take 1 capsule (180 mg total) by mouth daily. 90 capsule 3  ? folic acid (FOLVITE) 1 MG tablet  Take 1 tablet (1 mg total) by mouth once daily. 90 tablet 1  ? levothyroxine (SYNTHROID) 150 MCG tablet Take 1 tablet (150 mcg total) by mouth daily before breakfast. Take on empty stomach. 30 tablet 2  ? lidocaine-prilocaine (EMLA) cream Apply 1 application topically daily as needed. 30 g 3  ? Multiple Vitamin (MULTI-VITAMINS) TABS Take 1 tablet by mouth daily.    ? ondansetron (ZOFRAN) 4 MG tablet Take 2 tablets (8mg  total) by  mouth once every 8 hours as needed for nausea and vomiting. 80 tablet 1  ? TURMERIC PO Take 1 tablet by mouth daily.    ? ?No current facility-administered medications for this visit.  ? ?Facility-Administered Medications Ordered in Other Visits  ?Medication Dose Route Frequency Provider Last Rate Last Admin  ? heparin lock flush 100 UNIT/ML injection           ? heparin lock flush 100 unit/mL  500 Units Intravenous Once Charlaine Dalton R, MD      ? heparin lock flush 100 unit/mL  500 Units Intracatheter Once PRN Lloyd Huger, MD      ? pembrolizumab [facility]) 200 mg in sodium chloride 0.9 % 50 mL chemo infusion  200 mg Intravenous Once Lloyd Huger, MD      ? PEMEtrexed (ALIMTA) 800 mg in sodium chloride 0.9 % 100 mL chemo infusion  500 mg/m2 (Treatment Plan Recorded) Intravenous Once Lloyd Huger, MD      ? ? ?OBJECTIVE: ?Vitals:  ? 12/07/21 0849  ?BP: 130/83  ?Pulse: (!) 105  ?Resp: 19  ?Temp: (!) 96.7 ?F (35.9 ?C)  ?SpO2: 98%  ?   Body mass index is 26.36 kg/m?Marland Kitchen    ECOG FS:1 - Symptomatic but completely ambulatory ? ?General: Well-developed, well-nourished, no acute distress. ?Eyes: Pink conjunctiva, anicteric sclera. ?HEENT: Normocephalic, moist mucous membranes. ?Lungs: No audible wheezing or coughing. ?Heart: Regular rate and rhythm. ?Abdomen: Soft, nontender, no obvious distention. ?Musculoskeletal: No edema, cyanosis, or clubbing. ?Neuro: Alert, answering all questions appropriately. Cranial nerves grossly intact. ?Skin: No rashes or petechiae noted. ?Psych: Normal affect. ?Lymphatics: No cervical, calvicular, axillary or inguinal LAD. ? ? ?LAB RESULTS: ? ?Lab Results  ?Component Value Date  ? NA 136 12/07/2021  ? K 3.8 12/07/2021  ? CL 103 12/07/2021  ? CO2 26 12/07/2021  ? GLUCOSE 166 (H) 12/07/2021  ? BUN 10 12/07/2021  ? CREATININE 0.61 12/07/2021  ? CALCIUM 9.5 12/07/2021  ? PROT 8.0 12/07/2021  ? ALBUMIN 3.6 12/07/2021  ? AST 45 (H) 12/07/2021  ? ALT 30 12/07/2021  ? ALKPHOS  83 12/07/2021  ? BILITOT 0.3 12/07/2021  ? GFRNONAA >60 12/07/2021  ? GFRAA >60 03/19/2017  ? ? ?Lab Results  ?Component Value Date  ? WBC 10.6 (H) 12/07/2021  ? NEUTROABS 7.0 12/07/2021  ? HGB 12.6 12/07/2021  ? HCT 38.1 12/07/2021  ? MCV 103.3 (H) 12/07/2021  ? PLT 348 12/07/2021  ? ? ? ?STUDIES: ?No results found. ? ?ASSESSMENT: Stage IV adenocarcinoma of the lung ? ?PLAN:   ? ?Stage IV adenocarcinoma of the lung: Proceed with cycle 11 of maintenance Keytruda and pemetrexed.  Patient's most recent imaging on October 01, 2021 revealed no significant change in the size of her primary right lung mass.  Patient underwent left axillary node biopsy that confirmed metastatic disease, but it was determined that treatment did not need to be changed at that time.  Return to clinic in 3 3 weeks for further evaluation and consideration of  cycle 12.  ?Brain metastasis: Patient completed XRT in December 2022.  Repeat MRI on October 24, 2021 revealed decreased size of left parietal and left frontal metastasis.  No new lesions were reported. ?Iatrogenic hypothyroidism: Continue Synthroid as prescribed. ?A-fib: Heart rate well controlled.  Continue current medications as prescribed. ?5.  Leukocytosis: Mild, likely reactive.  Monitor. ? ?Patient expressed understanding and was in agreement with this plan. She also understands that She can call clinic at any time with any questions, concerns, or complaints.  ? ? Cancer Staging  ?Cancer of upper lobe of right lung ([place]) ?Staging form: Lung, AJCC 8th Edition ?- Clinical: Stage IVA (cT2, cN2, cM1a) - Signed by Cammie Sickle, MD on 04/10/2021 ? ? ?Lloyd Huger, MD   12/07/2021 10:23 AM ? ? ? ? ?

## 2021-12-07 NOTE — Patient Instructions (Signed)
[facility] CANCER CTR AT [place]  Discharge Instructions: ?Thank you for choosing [facility] to provide your oncology and hematology care.  ?If you have a lab appointment with the [facility], please go directly to the [facility] and check in at the registration area. ? ?Wear comfortable clothing and clothing appropriate for easy access to any Portacath or PICC line.  ? ?We strive to give you quality time with your provider. You may need to reschedule your appointment if you arrive late (15 or more minutes).  Arriving late affects you and other patients whose appointments are after yours.  Also, if you miss three or more appointments without notifying the office, you may be dismissed from the clinic at the provider?s discretion.    ?  ?For prescription refill requests, have your pharmacy contact our office and allow 72 hours for refills to be completed.   ? ?Today you received the following chemotherapy and/or immunotherapy agents: Alimta, Keytruda    ?  ?To help prevent nausea and vomiting after your treatment, we encourage you to take your nausea medication as directed. ? ?BELOW ARE SYMPTOMS THAT SHOULD BE REPORTED IMMEDIATELY: ?*FEVER GREATER THAN 100.4 F (38 ?C) OR HIGHER ?*CHILLS OR SWEATING ?*NAUSEA AND VOMITING THAT IS NOT CONTROLLED WITH YOUR NAUSEA MEDICATION ?*UNUSUAL SHORTNESS OF BREATH ?*UNUSUAL BRUISING OR BLEEDING ?*URINARY PROBLEMS (pain or burning when urinating, or frequent urination) ?*BOWEL PROBLEMS (unusual diarrhea, constipation, pain near the anus) ?TENDERNESS IN MOUTH AND THROAT WITH OR WITHOUT PRESENCE OF ULCERS (sore throat, sores in mouth, or a toothache) ?UNUSUAL RASH, SWELLING OR PAIN  ?UNUSUAL VAGINAL DISCHARGE OR ITCHING  ? ?Items with * indicate a potential emergency and should be followed up as soon as possible or go to the Emergency Department if any problems should occur. ? ?Please show the CHEMOTHERAPY ALERT CARD or IMMUNOTHERAPY ALERT CARD at  check-in to the Emergency Department and triage nurse. ? ?Should you have questions after your visit or need to cancel or reschedule your appointment, please contact [facility] CANCER [place] AT [place]  [phone] and follow the prompts.  Office hours are 8:00 a.m. to 4:30 p.m. Monday - Friday. Please note that voicemails left after 4:00 p.m. may not be returned until the following business day.  We are closed weekends and major holidays. You have access to a nurse at all times for urgent questions. Please call the main number to the clinic [phone] and follow the prompts. ? ?For any non-urgent questions, you may also contact your provider using MyChart. We now offer e-Visits for anyone 23 and older to request care online for non-urgent symptoms. For details visit [email or web address]. ?  ?Also download the MyChart app! Go to the app store, search "MyChart", open the app, select [facility], and log in with your MyChart username and password. ? ?Due to Covid, a mask is required upon entering the hospital/clinic. If you do not have a mask, one will be given to you upon arrival. For doctor visits, patients may have 1 support person aged 61 or older with them. For treatment visits, patients cannot have anyone with them due to current Covid guidelines and our immunocompromised population. Pembrolizumab injection ?What is this medication? ?PEMBROLIZUMAB (pem broe liz ue mab) is a monoclonal antibody. It is used to treat certain types of cancer. ?This medicine may be used for other purposes; ask your health care provider or pharmacist if you have questions. ?COMMON BRAND NAME(S): Keytruda ?What should I tell my care team before  I take this medication? ?They need to know if you have any of these conditions: ?autoimmune diseases like Crohn's disease, ulcerative colitis, or lupus ?have had or planning to have an allogeneic stem cell transplant (uses someone else's stem cells) ?history of organ  transplant ?history of chest radiation ?nervous system problems like myasthenia gravis or Guillain-Barre syndrome ?an unusual or allergic reaction to pembrolizumab, other medicines, foods, dyes, or preservatives ?pregnant or trying to get pregnant ?breast-feeding ?How should I use this medication? ?This medicine is for infusion into a vein. It is given by a health care professional in a hospital or clinic setting. ?A special MedGuide will be given to you before each treatment. Be sure to read this information carefully each time. ?Talk to your pediatrician regarding the use of this medicine in children. While this drug may be prescribed for children as young as 6 months for selected conditions, precautions do apply. ?Overdosage: If you think you have taken too much of this medicine contact a poison control center or emergency room at once. ?NOTE: This medicine is only for you. Do not share this medicine with others. ?What if I miss a dose? ?It is important not to miss your dose. Call your doctor or health care professional if you are unable to keep an appointment. ?What may interact with this medication? ?Interactions have not been studied. ?This list may not describe all possible interactions. Give your health care provider a list of all the medicines, herbs, non-prescription drugs, or dietary supplements you use. Also tell them if you smoke, drink alcohol, or use illegal drugs. Some items may interact with your medicine. ?What should I watch for while using this medication? ?Your condition will be monitored carefully while you are receiving this medicine. ?You may need blood work done while you are taking this medicine. ?Do not become pregnant while taking this medicine or for 4 months after stopping it. Women should inform their doctor if they wish to become pregnant or think they might be pregnant. There is a potential for serious side effects to an unborn child. Talk to your health care professional or  pharmacist for more information. Do not breast-feed an infant while taking this medicine or for 4 months after the last dose. ?What side effects may I notice from receiving this medication? ?Side effects that you should report to your doctor or health care professional as soon as possible: ?allergic reactions like skin rash, itching or hives, swelling of the face, lips, or tongue ?bloody or black, tarry ?breathing problems ?changes in vision ?chest pain ?chills ?confusion ?constipation ?cough ?diarrhea ?dizziness or feeling faint or lightheaded ?fast or irregular heartbeat ?fever ?flushing ?joint pain ?low blood counts - this medicine may decrease the number of white blood cells, red blood cells and platelets. You may be at increased risk for infections and bleeding. ?muscle pain ?muscle weakness ?pain, tingling, numbness in the hands or feet ?persistent headache ?redness, blistering, peeling or loosening of the skin, including inside the mouth ?signs and symptoms of high blood sugar such as dizziness; dry mouth; dry skin; fruity breath; nausea; stomach pain; increased hunger or thirst; increased urination ?signs and symptoms of kidney injury like trouble passing urine or change in the amount of urine ?signs and symptoms of liver injury like dark urine, light-colored stools, loss of appetite, nausea, right upper belly pain, yellowing of the eyes or skin ?sweating ?swollen lymph nodes ?weight loss ?Side effects that usually do not require medical attention (report to your doctor or health  care professional if they continue or are bothersome): ?decreased appetite ?hair loss ?tiredness ?This list may not describe all possible side effects. Call your doctor for medical advice about side effects. You may report side effects to FDA at 1-800-FDA-1088. ?Where should I keep my medication? ?This drug is given in a hospital or clinic and will not be stored at home. ?NOTE: This sheet is a summary. It may not cover all possible  information. If you have questions about this medicine, talk to your doctor, pharmacist, or health care provider. ?? 2022 Elsevier/Gold Standard (2021-05-15 00:00:00) ?Pemetrexed injection ?What is this medication? ?PEMETREXED

## 2021-12-17 ENCOUNTER — Other Ambulatory Visit: Payer: Medicaid Other

## 2021-12-24 ENCOUNTER — Other Ambulatory Visit: Payer: Self-pay

## 2021-12-28 ENCOUNTER — Inpatient Hospital Stay (HOSPITAL_BASED_OUTPATIENT_CLINIC_OR_DEPARTMENT_OTHER): Payer: Medicaid Other | Admitting: Internal Medicine

## 2021-12-28 ENCOUNTER — Inpatient Hospital Stay: Payer: Medicaid Other

## 2021-12-28 ENCOUNTER — Telehealth: Payer: Self-pay | Admitting: *Deleted

## 2021-12-28 ENCOUNTER — Inpatient Hospital Stay: Payer: Medicaid Other | Attending: Internal Medicine

## 2021-12-28 ENCOUNTER — Encounter: Payer: Self-pay | Admitting: Internal Medicine

## 2021-12-28 VITALS — BP 131/73 | HR 88 | Resp 18

## 2021-12-28 DIAGNOSIS — Z5112 Encounter for antineoplastic immunotherapy: Secondary | ICD-10-CM | POA: Diagnosis not present

## 2021-12-28 DIAGNOSIS — Z5111 Encounter for antineoplastic chemotherapy: Secondary | ICD-10-CM | POA: Insufficient documentation

## 2021-12-28 DIAGNOSIS — C3411 Malignant neoplasm of upper lobe, right bronchus or lung: Secondary | ICD-10-CM

## 2021-12-28 DIAGNOSIS — R42 Dizziness and giddiness: Secondary | ICD-10-CM | POA: Insufficient documentation

## 2021-12-28 DIAGNOSIS — C7931 Secondary malignant neoplasm of brain: Secondary | ICD-10-CM | POA: Diagnosis not present

## 2021-12-28 DIAGNOSIS — R519 Headache, unspecified: Secondary | ICD-10-CM | POA: Insufficient documentation

## 2021-12-28 DIAGNOSIS — E038 Other specified hypothyroidism: Secondary | ICD-10-CM | POA: Insufficient documentation

## 2021-12-28 DIAGNOSIS — I3131 Malignant pericardial effusion in diseases classified elsewhere: Secondary | ICD-10-CM

## 2021-12-28 DIAGNOSIS — J449 Chronic obstructive pulmonary disease, unspecified: Secondary | ICD-10-CM | POA: Insufficient documentation

## 2021-12-28 LAB — COMPREHENSIVE METABOLIC PANEL
ALT: 26 U/L (ref 0–44)
AST: 42 U/L — ABNORMAL HIGH (ref 15–41)
Albumin: 3.3 g/dL — ABNORMAL LOW (ref 3.5–5.0)
Alkaline Phosphatase: 78 U/L (ref 38–126)
Anion gap: 9 (ref 5–15)
BUN: 8 mg/dL (ref 6–20)
CO2: 25 mmol/L (ref 22–32)
Calcium: 9.1 mg/dL (ref 8.9–10.3)
Chloride: 103 mmol/L (ref 98–111)
Creatinine, Ser: 0.69 mg/dL (ref 0.44–1.00)
GFR, Estimated: >60 mL/min (ref >60)
Glucose, Bld: 159 mg/dL — ABNORMAL HIGH (ref 70–99)
Potassium: 3.6 mmol/L (ref 3.5–5.1)
Sodium: 137 mmol/L (ref 135–145)
Total Bilirubin: 0.3 mg/dL (ref 0.3–1.2)
Total Protein: 7.7 g/dL (ref 6.5–8.1)

## 2021-12-28 LAB — CBC WITH DIFFERENTIAL/PLATELET
Abs Immature Granulocytes: 0.07 10*3/uL (ref 0.00–0.07)
Basophils Absolute: 0.1 10*3/uL (ref 0.0–0.1)
Basophils Relative: 1 %
Eosinophils Absolute: 0.2 10*3/uL (ref 0.0–0.5)
Eosinophils Relative: 2 %
HCT: 36.6 % (ref 36.0–46.0)
Hemoglobin: 11.8 g/dL — ABNORMAL LOW (ref 12.0–15.0)
Immature Granulocytes: 1 %
Lymphocytes Relative: 20 %
Lymphs Abs: 2.2 10*3/uL (ref 0.7–4.0)
MCH: 33.1 pg (ref 26.0–34.0)
MCHC: 32.2 g/dL (ref 30.0–36.0)
MCV: 102.5 fL — ABNORMAL HIGH (ref 80.0–100.0)
Monocytes Absolute: 1.3 10*3/uL — ABNORMAL HIGH (ref 0.1–1.0)
Monocytes Relative: 12 %
Neutro Abs: 7.3 10*3/uL (ref 1.7–7.7)
Neutrophils Relative %: 64 %
Platelets: 314 10*3/uL (ref 150–400)
RBC: 3.57 MIL/uL — ABNORMAL LOW (ref 3.87–5.11)
RDW: 13.2 % (ref 11.5–15.5)
WBC: 11.1 10*3/uL — ABNORMAL HIGH (ref 4.0–10.5)
nRBC: 0 % (ref 0.0–0.2)

## 2021-12-28 MED ORDER — SODIUM CHLORIDE 0.9 % IV SOLN
500.0000 mg/m2 | Freq: Once | INTRAVENOUS | Status: AC
  Administered 2021-12-28: 800 mg via INTRAVENOUS
  Filled 2021-12-28: qty 20

## 2021-12-28 MED ORDER — MECLIZINE HCL 25 MG PO TABS
ORAL_TABLET | ORAL | 0 refills | Status: DC
Start: 2021-12-28 — End: 2022-01-21

## 2021-12-28 MED ORDER — FAMOTIDINE IN NACL 20-0.9 MG/50ML-% IV SOLN
20.0000 mg | Freq: Once | INTRAVENOUS | Status: AC | PRN
  Administered 2021-12-28: 20 mg via INTRAVENOUS

## 2021-12-28 MED ORDER — SODIUM CHLORIDE 0.9 % IV SOLN
Freq: Once | INTRAVENOUS | Status: DC | PRN
  Filled 2021-12-28: qty 250

## 2021-12-28 MED ORDER — PROCHLORPERAZINE MALEATE 10 MG PO TABS
10.0000 mg | ORAL_TABLET | Freq: Once | ORAL | Status: AC
  Administered 2021-12-28: 10 mg via ORAL
  Filled 2021-12-28: qty 1

## 2021-12-28 MED ORDER — HEPARIN SOD (PORK) LOCK FLUSH 100 UNIT/ML IV SOLN
500.0000 [IU] | Freq: Once | INTRAVENOUS | Status: AC | PRN
  Administered 2021-12-28: 500 [IU]
  Filled 2021-12-28: qty 5

## 2021-12-28 MED ORDER — SODIUM CHLORIDE 0.9 % IV SOLN
200.0000 mg | Freq: Once | INTRAVENOUS | Status: AC
  Administered 2021-12-28: 200 mg via INTRAVENOUS
  Filled 2021-12-28: qty 8

## 2021-12-28 MED ORDER — DIPHENHYDRAMINE HCL 50 MG/ML IJ SOLN
50.0000 mg | Freq: Once | INTRAMUSCULAR | Status: AC | PRN
  Administered 2021-12-28: 50 mg via INTRAVENOUS

## 2021-12-28 MED ORDER — SODIUM CHLORIDE 0.9 % IV SOLN
Freq: Once | INTRAVENOUS | Status: AC
  Filled 2021-12-28: qty 250

## 2021-12-28 NOTE — Assessment & Plan Note (Signed)
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 JAN, 23rd 2023-No significant change in size of the right lung mass, with stable right lower lobe septal thickening and decreased size of the index mediastinal lymph nodes.No evidence of metastatic disease within the abdomen or pelvis; see below. STABLE; except left axillary LN- see below.  ? ?# Continue with Alimta Keytruda maintenance Labs today reviewed;  acceptable for treatment today.   ? ?# CT scan JAN 2023- increase in size of lobular centrally necrotic left axillary lymph nodes ~ 12 mm-.[facility] 2023-positive for adenocarcinoma of lung origin.  Given the overall stability of the systemic disease I think is reasonable to continue monitor at this time.  However if it starts growing or causing discomfort would recommend local radiation; awaiting repeat CT CAP- ordered today.  ? ?# Subcentimeter brain lesions-clinically asymptomatic. Repeat MRI brain- 11/22 Mildly increased size of a left parietal metastasis with increased, mild edema. Decreased size of 2 other metastases. S/p SRS- finished on DEC 23rd, 2022-  10/24/2021-- MRI- Decreased size of left parietal and left frontal metastases. Will repeat MRI in May 2023.  ? ?# VERTIGO: Dizzy spells/ nausea; intermittent headaches- resolved over 1 day; monitor closely. Set script for Antivert prn.  ? ?# Iatrogenic Hypothyrodism [TSH 86 in [place]- on sythroid 150 mcg/day; FEB 2023- 23; awaiting TSH from today ? ?? ?# COPD stable-significant provement s/p/bronchodilators/diuretics- STABLE ? ?#A. Fib-amiodarone/Cardizem as per cardiology [hx of pericardial tamponade]-  Not on anti-coagulation; S/p Cards eval in Jan 2023- STABLE.  ? ?# Port malfunction: STABLE.  ? ? *B12 injection- ? ?2/16-B12; ?# DISPOSITION: ?# chemo today; ?# follow up in 3 week- Thursday/Friday;   MD; labs- cbc/cmp; Alimta-keytruda; B12 injection;CT CAP Dr.B ? ? ? ?

## 2021-12-28 NOTE — Progress Notes (Signed)
Pt states last weekend she had a spell for 1 day were she was dizzy, nausea and vomiting, couldn't keep anything down. Still feeling wobbly. ? ? ?

## 2021-12-28 NOTE — Patient Instructions (Signed)
[facility] CANCER CTR AT [place]  Discharge Instructions: ?Thank you for choosing [facility] to provide your oncology and hematology care.  ?If you have a lab appointment with the [facility], please go directly to the [facility] and check in at the registration area. ? ?Wear comfortable clothing and clothing appropriate for easy access to any Portacath or PICC line.  ? ?We strive to give you quality time with your provider. You may need to reschedule your appointment if you arrive late (15 or more minutes).  Arriving late affects you and other patients whose appointments are after yours.  Also, if you miss three or more appointments without notifying the office, you may be dismissed from the clinic at the provider?s discretion.    ?  ?For prescription refill requests, have your pharmacy contact our office and allow 72 hours for refills to be completed.   ? ?Today you received the following chemotherapy and/or immunotherapy agents [place]    ?  ?To help prevent nausea and vomiting after your treatment, we encourage you to take your nausea medication as directed. ? ?BELOW ARE SYMPTOMS THAT SHOULD BE REPORTED IMMEDIATELY: ?*FEVER GREATER THAN 100.4 F (38 ?C) OR HIGHER ?*CHILLS OR SWEATING ?*NAUSEA AND VOMITING THAT IS NOT CONTROLLED WITH YOUR NAUSEA MEDICATION ?*UNUSUAL SHORTNESS OF BREATH ?*UNUSUAL BRUISING OR BLEEDING ?*URINARY PROBLEMS (pain or burning when urinating, or frequent urination) ?*BOWEL PROBLEMS (unusual diarrhea, constipation, pain near the anus) ?TENDERNESS IN MOUTH AND THROAT WITH OR WITHOUT PRESENCE OF ULCERS (sore throat, sores in mouth, or a toothache) ?UNUSUAL RASH, SWELLING OR PAIN  ?UNUSUAL VAGINAL DISCHARGE OR ITCHING  ? ?Items with * indicate a potential emergency and should be followed up as soon as possible or go to the Emergency Department if any problems should occur. ? ?Please show the CHEMOTHERAPY ALERT CARD or IMMUNOTHERAPY ALERT CARD at  check-in to the Emergency Department and triage nurse. ? ?Should you have questions after your visit or need to cancel or reschedule your appointment, please contact [facility] CANCER [place] AT [place]  [phone] and follow the prompts.  Office hours are 8:00 a.m. to 4:30 p.m. Monday - Friday. Please note that voicemails left after 4:00 p.m. may not be returned until the following business day.  We are closed weekends and major holidays. You have access to a nurse at all times for urgent questions. Please call the main number to the clinic [phone] and follow the prompts. ? ?For any non-urgent questions, you may also contact your provider using MyChart. We now offer e-Visits for anyone 56 and older to request care online for non-urgent symptoms. For details visit [email or web address]. ?  ?Also download the MyChart app! Go to the app store, search "MyChart", open the app, select [facility], and log in with your MyChart username and password. ? ?Due to Covid, a mask is required upon entering the hospital/clinic. If you do not have a mask, one will be given to you upon arrival. For doctor visits, patients may have 1 support person aged 4 or older with them. For treatment visits, patients cannot have anyone with them due to current Covid guidelines and our immunocompromised population.  ?

## 2021-12-28 NOTE — Progress Notes (Signed)
After completion of Keytruda and Alimta today, patient started c/o bilateral palmar erythema and itchiness. It was also observed that patient had slight facial flushing. ? ?RN noticed slight palmar erythema after flushing Keytruda, prior to starting Alimta, but patient stated this was normal for her. Patient was noted to have slight SOB after ambulating from restroom as well but patient reported SOB was normal for her with ambulating. Patient denied any other symptoms at this time. RN questioned patient several times prior to start of Alimta to ensure she felt well and patient reassured RN that she was fine and had no symptoms. SOB was resolved with resting. RN proceeded with Alimta treatment. After completion of treatment and flushes (approx. 1157), patient reports bilateral palm redness and itchiness. Upon assessment it was noted that bilateral palms were red and facial flushing was observed. Vital signs stable (See Flowsheet). Beckey Rutter, NP and Dr. Rogue Bussing made aware. Beckey Rutter, NP at chairside at 1214. Patient then started to develop slight rash on arms but reported itchiness felt better. Per Beckey Rutter, NP - Start  bolus IVF [phone]) and give 25 MG of Benadryl (1221). Beckey Rutter, NP back to chairside at 1239 to reassess patient, patient s/s improved but still has slight rash. Per NP, proceed with Pepcid 20 MG. Pepcid completed at 1256. Patient reports itchiness has completely resolved, palmar erythema has improved, and bilateral arm rash has improved as well. VS stable. NP made aware. Per NP, okay to discharge home. Patient educated on s/s as to when to seek emergency care and advised to reach out to the clinic for any other questions or concerns. Patient verbalizes understanding and denies any further questions or concerns.  ?

## 2021-12-28 NOTE — Progress Notes (Signed)
[facility] ?CONSULT NOTE ? ?Patient Care Team: ?[facility] as PCP - General ?End, Harrell Gave, MD as PCP - Cardiology (Cardiology) ?Telford Nab, RN as Sales executive ? ?CHIEF COMPLAINTS/PURPOSE OF CONSULTATION: Lung cancer ? ?#  ?Oncology History Overview Note  ?# AUG [id number] IV adenocarcinoma of the lung [s/p pericardial effusion/tamponade]. MRI Brain AUG 16th, 2022- IMPRESSION: ?Multiple peripherally enhancing, centrally T1 hypointense lesions, ?with surrounding T2 hyperintense signal in the bilateral cerebral ?hemispheres, concerning for metastatic disease. ?  ? ?# AUG 17th 2022- CARBO-ALIMTA-Keytruda #1 ? ?# Mammogram- FEB 2023-no lesions noted in the breasts; 1 to 2 cm lymph node left underarm;. LYMPH NODE, LEFT AXILLARY; ULTRASOUND-GUIDED CORE NEEDLE BIOPSY:  ?- POSITIVE FOR MALIGNANCY.  ?- METASTATIC ADENOCARCINOMA, COMPATIBLE WITH PULMONARY PRIMARY ? ? # Repeat MRI brain- 11/22 Mildly increased size of a left parietal metastasis with increased, mild edema. Decreased size of 2 other metastases. S/p SRS- finished on DEC 23rd, 2022- MRI FEB 2023-improved brain lesions ? ?# JAN 2023-iatrogenic hypothyroidism- TSH 83; synthroid 146mg ? ?#Acute respiratory failure BiPAP/ICU.  S/p pericardiocentesis. ? ?NGS; PDL-1-0 ? ?  ?Malignant pericardial effusion  ?03/28/2021 Initial Diagnosis  ? Malignant pericardial effusion (HCC) ? ?  ?04/25/2021 -  Chemotherapy  ? Patient is on Treatment Plan : LUNG CARBOplatin / Pemetrexed / Pembrolizumab q21d Induction x 4 cycles / Maintenance Pemetrexed + Pembrolizumab  ? ?  ?  ?Cancer of upper lobe of right lung ([facility] [facility]  ?04/10/2021 Initial Diagnosis  ? Cancer of [place] lobe of right lung ([facility] ? ?  ?04/10/2021 Cancer Staging  ? Staging form: Lung, AJCC 8th Edition ?- Clinical: Stage IVA (cT2, cN2, cM1a) - Signed by BCammie Sickle MD on 04/10/2021 ? ?  ? ?HISTORY OF PRESENTING ILLNESS: Accompanied by family; patient currently in  wheelchair. ? ?LGWYNETH [id number].  female  stage IV adenocarcinoma the lung cancer is here currently on chemotherapy-immunotherapy is here for a follow up. ? ?In the interim patient had episode of dizziness/nausea intermittent headaches vomiting that lasted for a day or so.  The episode has since resolved.  Patient admits to compliance with Synthroid. ? ?Patient denies any further episodes of chest pain. Chronic mild shortness of breath.  No cough. ? ?Review of Systems  ?Constitutional:  Positive for malaise/fatigue. Negative for chills, diaphoresis and fever.  ?HENT:  Negative for nosebleeds and sore throat.   ?Eyes:  Negative for double vision.  ?Respiratory:  Positive for shortness of breath. Negative for cough, hemoptysis, sputum production and wheezing.   ?Cardiovascular:  Negative for chest pain, palpitations, orthopnea and leg swelling.  ?Gastrointestinal:  Negative for abdominal pain, blood in stool, constipation, diarrhea, heartburn, melena, nausea and vomiting.  ?Genitourinary:  Negative for dysuria, frequency and urgency.  ?Musculoskeletal:  Positive for myalgias. Negative for back pain and joint pain.  ?Skin: Negative.  Negative for itching and rash.  ?Neurological:  Negative for dizziness, tingling, focal weakness, weakness and headaches.  ?Endo/Heme/Allergies:  Does not bruise/bleed easily.  ?Psychiatric/Behavioral:  Negative for depression. The patient is not nervous/anxious and does not have insomnia.    ? ?MEDICAL HISTORY:  ?Past Medical History:  ?Diagnosis Date  ? Graves disease   ? Malignant pericardial effusion   ? Paroxysmal atrial fibrillation (HCC)   ? Primary lung adenocarcinoma ([facility]   ? ? ?SURGICAL HISTORY: ?Past Surgical History:  ?Procedure Laterality Date  ? IR CV LINE INJECTION  06/19/2021  ? IR IMAGING GUIDED PORT INSERTION  04/27/2021  ?  PERICARDIOCENTESIS N/A 03/27/2021  ? Procedure: PERICARDIOCENTESIS;  Surgeon: Nelva Bush, MD;  Location: [facility];  Service:  Cardiovascular;  Laterality: N/A;  ? TUBAL LIGATION    ? ? ?SOCIAL HISTORY: ?Social History  ? ?Socioeconomic History  ? Marital status: Widowed  ?  Spouse name: Not on file  ? Number of children: Not on file  ? Years of education: Not on file  ? Highest education level: Not on file  ?Occupational History  ? Not on file  ?Tobacco Use  ? Smoking status: Former  ?  Packs/day: 1.00  ?  Years: 40.00  ?  Pack years: 40.00  ?  Types: Cigarettes  ? Smokeless tobacco: Never  ?Vaping Use  ? Vaping Use: Never used  ?Substance and Sexual Activity  ? Alcohol use: No  ? Drug use: Not Currently  ?  Types: Marijuana  ? Sexual activity: Not Currently  ?Other Topics Concern  ? Not on file  ?Social History Narrative  ? Not on file  ? ?Social Determinants of Health  ? ?Financial Resource Strain: High Risk  ? Difficulty of Paying Living Expenses: Very hard  ?Food Insecurity: Not on file  ?Transportation Needs: Not on file  ?Physical Activity: Not on file  ?Stress: Stress Concern Present  ? Feeling of Stress : Very much  ?Social Connections: Not on file  ?Intimate Partner Violence: Not on file  ? ? ?FAMILY HISTORY: ?Family History  ?Problem Relation Age of Onset  ? Peripheral Artery Disease Mother   ? Breast cancer Neg Hx   ? ? ?ALLERGIES:  is allergic to diazepam, metoprolol, and tapazole [methimazole]. ? ?MEDICATIONS:  ?Current Outpatient Medications  ?Medication Sig Dispense Refill  ? bismuth subsalicylate (PEPTO BISMOL) 262 MG/15ML suspension Take 30 mLs by mouth every 6 (six) hours as needed.    ? diltiazem (CARDIZEM CD) 180 MG 24 hr capsule Take 1 capsule (180 mg total) by mouth daily. 90 capsule 3  ? folic acid (FOLVITE) 1 MG tablet Take 1 tablet (1 mg total) by mouth once daily. 90 tablet 1  ? levothyroxine (SYNTHROID) 150 MCG tablet Take 1 tablet (150 mcg total) by mouth daily before breakfast. Take on empty stomach. 30 tablet 2  ? lidocaine-prilocaine (EMLA) cream Apply 1 application topically daily as needed. 30 g 3  ?  meclizine (ANTIVERT) 25 MG tablet 1 pill at night as needed; if dizziness not improved can take one pill every 8 hours as needed/tolerated. 30 tablet 0  ? Multiple Vitamin (MULTI-VITAMINS) TABS Take 1 tablet by mouth daily.    ? ondansetron (ZOFRAN) 4 MG tablet Take 2 tablets (8m total) by mouth once every 8 hours as needed for nausea and vomiting. 80 tablet 1  ? TURMERIC PO Take 1 tablet by mouth daily.    ? ?No current facility-administered medications for this visit.  ? ?Facility-Administered Medications Ordered in Other Visits  ?Medication Dose Route Frequency Provider Last Rate Last Admin  ? heparin lock flush 100 unit/mL  500 Units Intracatheter Once PRN BCammie Sickle MD      ? pembrolizumab ([facility] 200 mg in sodium chloride 0.9 % 50 mL chemo infusion  200 mg Intravenous Once BCharlaine DaltonR, MD      ? PEMEtrexed (ALIMTA) 800 mg in sodium chloride 0.9 % 100 mL chemo infusion  500 mg/m2 (Treatment Plan Recorded) Intravenous Once BCammie Sickle MD      ? ? ?Left axillary lymphadenopathy noted ~1 to 2 cm in size ? ? ?  PHYSICAL EXAMINATION: ?ECOG PERFORMANCE STATUS: 1 - Symptomatic but completely ambulatory ? ?Vitals:  ? 12/28/21 0920  ?BP: 123/88  ?Pulse: 93  ?Temp: (!) 96.2 ?F (35.7 ?C)  ?SpO2: 99%  ? ?Filed Weights  ? 12/28/21 0920  ?Weight: 143 lb 3.2 oz (65 kg)  ? ? ?Physical Exam ?Vitals and nursing note reviewed.  ?HENT:  ?   Head: Normocephalic and atraumatic.  ?   Mouth/Throat:  ?   Pharynx: Oropharynx is clear.  ?Eyes:  ?   Extraocular Movements: Extraocular movements intact.  ?   Pupils: Pupils are equal, round, and reactive to light.  ?Cardiovascular:  ?   Rate and Rhythm: Normal rate and regular rhythm.  ?Pulmonary:  ?   Comments: Decreased breath sounds bilaterally.  ?Abdominal:  ?   Palpations: Abdomen is soft.  ?Musculoskeletal:     ?   General: Normal range of motion.  ?   Cervical back: Normal range of motion.  ?Skin: ?   General: Skin is warm.  ?Neurological:  ?    General: No focal deficit present.  ?   Mental Status: She is alert and oriented to person, place, and time.  ?Psychiatric:     ?   Behavior: Behavior normal.     ?   Judgment: Judgment normal.  ? ? ? ?LABORATORY DATA:  ?I hav

## 2021-12-28 NOTE — Telephone Encounter (Signed)
[facility] called asking for return call to discuss results ? ?CBC with Differential ?Order: [id number] ?Status: Final result    ?Visible to patient: Yes (seen)    ?Next appt: 01/18/2022 at 08:30 AM in Oncology ([facility])    ?Dx: Cancer of upper lobe of right lung ([place])    ?0 Result Notes ?          ?Component Ref Range & Units 09:09 ?(12/28/21) 3 wk ago ?(12/07/21) 1 mo ago ?(11/15/21) 2 mo ago ?(10/25/21) 2 mo ago ?(10/03/21) 3 mo ago ?(09/12/21) 4 mo ago ?(08/15/21)  ?WBC 4.0 - 10.5 K/uL 11.1 High   10.6 High   11.0 High   10.6 High   13.4 High   11.3 High   11.5 High    ?RBC 3.87 - 5.11 MIL/uL 3.57 Low   3.69 Low   3.60 Low   3.71 Low   4.02  4.07  4.01   ?Hemoglobin 12.0 - 15.0 g/dL 11.8 Low   12.6  12.3  12.5  13.4  13.5  12.9   ?HCT 36.0 - 46.0 % 36.6  38.1  37.0  37.4  40.0  40.2  39.1   ?MCV 80.0 - 100.0 fL 102.5 High   103.3 High   102.8 High   100.8 High   99.5  98.8  97.5   ?MCH 26.0 - 34.0 pg 33.1  34.1 High   34.2 High   33.7  33.3  33.2  32.2   ?MCHC 30.0 - 36.0 g/dL 32.2  33.1  33.2  33.4  33.5  33.6  33.0   ?RDW 11.5 - 15.5 % 13.2  13.4  14.6  15.1  14.8  14.7  15.6 High    ?Platelets 150 - 400 K/uL 314  348  353  369  351  177  331   ?nRBC 0.0 - 0.2 % 0.0  0.0  0.0  0.0  0.0  0.0  0.0   ?Neutrophils Relative % % 64  66  69  60  62  65  66   ?Neutro Abs 1.7 - 7.7 K/uL 7.3  7.0  7.6  6.3  8.4 High   7.4  7.7   ?Lymphocytes Relative % 20  21  18  25  27  24  21    ?Lymphs Abs 0.7 - 4.0 K/uL 2.2  2.2  2.0  2.7  3.6  2.7  2.4   ?Monocytes Relative % 12  9  10  12  8  8  8    ?Monocytes Absolute 0.1 - 1.0 K/uL 1.3 High   0.9  1.0  1.3 High   1.0  0.9  0.9   ?Eosinophils Relative % 2  2  1  1  1  2  3    ?Eosinophils Absolute 0.0 - 0.5 K/uL 0.2  0.2  0.2  0.1  0.1  0.2  0.4   ?Basophils Relative % 1  1  1  1  1   0  1   ?Basophils Absolute 0.0 - 0.1 K/uL 0.1  0.1  0.1  0.1  0.1  0.0  0.1   ?Immature Granulocytes % 1  1  1  1  1  1  1    ?Abs Immature Granulocytes 0.00 - 0.07 K/uL 0.07  0.07 CM  0.07 CM  0.10 High  CM   0.18 High  CM  0.12 High  CM  0.09 High  CM   ?Comment: Performed at [facility],  [address], [place], [place] [address]  ?Resulting Agency  [place] [facility] [place] [facility] [place] [facility] [place] [facility] [place] [facility] [place] [facility] [place] [facility]  ?  ? ?  ?  ?Specimen Collected: 12/28/21 09:09 Last Resulted: 12/28/21 09:38  ?  ?  Lab Flowsheet   ? Order Details   ? View Encounter   ? Lab and Collection Details   ? Routing   ? Result History    ?View Encounter Conversation    ?  ?CM=Additional comments    ?  ?Result Care Coordination ? ? ?Patient Communication ? ? Add Comments   Seen Back to Top  ?  ?  ? ?Other Results from 12/28/2021 ? ? Contains abnormal data Comprehensive metabolic panel ?Order: [id number] ?Status: Final result    ?Visible to patient: Yes (seen)    ?Next appt: 01/18/2022 at 08:30 AM in Oncology ([facility])    ?Dx: Cancer of upper lobe of right lung ([place])    ?0 Result Notes ?          ?Component Ref Range & Units 09:09 ?(12/28/21) 3 wk ago ?(12/07/21) 1 mo ago ?(11/15/21) 2 mo ago ?(10/25/21) 2 mo ago ?(10/03/21) 3 mo ago ?(09/12/21) 4 mo ago ?(08/15/21)  ?Sodium 135 - 145 mmol/L 137  136  137  136  137  136  139   ?Potassium 3.5 - 5.1 mmol/L 3.6  3.8  3.7  4.1  3.7  3.8  3.8   ?Chloride 98 - 111 mmol/L 103  103  102  103  100  103  102   ?CO2 22 - 32 mmol/L 25  26  24  25  26  27  25    ?Glucose, Bld 70 - 99 mg/dL 159 High   166 High  CM  186 High  CM  148 High  CM  132 High  CM  125 High  CM  147 High  CM   ?Comment: Glucose reference range applies only to samples taken after fasting for at least 8 hours.  ?BUN 6 - 20 mg/dL 8  10  12  13  11  18  15    ?Creatinine, Ser 0.44 - 1.00 mg/dL 0.69  0.61  0.69  0.68  0.74  0.76  0.65   ?Calcium 8.9 - 10.3 mg/dL 9.1  9.5  9.4  9.4  9.4  9.0  9.5   ?Total Protein 6.5 - 8.1 g/dL 7.7  8.0  8.1  8.6 High   7.7  7.3  8.0   ?Albumin 3.5 - 5.0 g/dL 3.3 Low   3.6  3.7  3.7  3.8  3.6  3.7   ?AST 15 - 41 U/L 42 High   45 High   52 High   59 High   61 High   43 High   59 High     ?ALT 0 - 44 U/L 26  30  32  40  46 High   42  41   ?Alkaline Phosphatase 38 - 126 U/L 78  83  79  86  84  76  82   ?Total Bilirubin 0.3 - 1.2 mg/dL 0.3  0.3  0.5  0.4  0.5  <0.1 Low   0.3   ?GFR, Estimated >60 mL/min >60  >60 CM  >60 CM  >60 CM  >60 CM  >60 CM  >60 CM   ?Comment: (NOTE)  ?Calculated using the CKD-EPI Creatinine Equation (2021)   ?  Anion gap 5 - 15 9  7  CM  11 CM  8 CM  11 CM  6 CM  12 CM   ?Comment: Performed at [facility], [address], [place], [place] [address]  ?Resulting Agency  [place] [facility] [place] [facility] [place] [facility] [place] [facility] [place] [facility] [place] [facility]  ?  ? ?  ?  ?Specimen Collected: 12/28/21 09:09 Last Resulted: 12/28/21 09:43  ?  ?  ? ?

## 2021-12-28 NOTE — Addendum Note (Signed)
Addended by: Charlaine Dalton R on: 12/28/2021 10:52 AM ? ? Modules accepted: Orders ? ?

## 2021-12-31 ENCOUNTER — Emergency Department: Payer: Medicaid Other

## 2021-12-31 ENCOUNTER — Telehealth: Payer: Self-pay | Admitting: Internal Medicine

## 2021-12-31 ENCOUNTER — Other Ambulatory Visit: Payer: Self-pay

## 2021-12-31 ENCOUNTER — Telehealth: Payer: Self-pay

## 2021-12-31 ENCOUNTER — Encounter: Payer: Self-pay | Admitting: Internal Medicine

## 2021-12-31 ENCOUNTER — Other Ambulatory Visit: Payer: Self-pay | Admitting: Nurse Practitioner

## 2021-12-31 ENCOUNTER — Inpatient Hospital Stay: Payer: Medicaid Other

## 2021-12-31 ENCOUNTER — Inpatient Hospital Stay
Admission: EM | Admit: 2021-12-31 | Discharge: 2022-01-03 | DRG: 054 | Disposition: A | Discharge status: Home / Self Care | Payer: Medicaid Other | Attending: Internal Medicine | Admitting: Internal Medicine

## 2021-12-31 DIAGNOSIS — G936 Cerebral edema: Secondary | ICD-10-CM | POA: Diagnosis present

## 2021-12-31 DIAGNOSIS — I10 Essential (primary) hypertension: Secondary | ICD-10-CM | POA: Diagnosis present

## 2021-12-31 DIAGNOSIS — Z9221 Personal history of antineoplastic chemotherapy: Secondary | ICD-10-CM | POA: Diagnosis not present

## 2021-12-31 DIAGNOSIS — Z87891 Personal history of nicotine dependence: Secondary | ICD-10-CM | POA: Diagnosis not present

## 2021-12-31 DIAGNOSIS — Z79890 Hormone replacement therapy (postmenopausal): Secondary | ICD-10-CM | POA: Diagnosis not present

## 2021-12-31 DIAGNOSIS — M79604 Pain in right leg: Secondary | ICD-10-CM

## 2021-12-31 DIAGNOSIS — I480 Paroxysmal atrial fibrillation: Secondary | ICD-10-CM | POA: Diagnosis present

## 2021-12-31 DIAGNOSIS — C7931 Secondary malignant neoplasm of brain: Secondary | ICD-10-CM | POA: Diagnosis present

## 2021-12-31 DIAGNOSIS — E039 Hypothyroidism, unspecified: Secondary | ICD-10-CM | POA: Diagnosis present

## 2021-12-31 DIAGNOSIS — Z79899 Other long term (current) drug therapy: Secondary | ICD-10-CM | POA: Diagnosis not present

## 2021-12-31 DIAGNOSIS — G8191 Hemiplegia, unspecified affecting right dominant side: Secondary | ICD-10-CM | POA: Diagnosis present

## 2021-12-31 DIAGNOSIS — Z923 Personal history of irradiation: Secondary | ICD-10-CM | POA: Diagnosis not present

## 2021-12-31 DIAGNOSIS — C3490 Malignant neoplasm of unspecified part of unspecified bronchus or lung: Secondary | ICD-10-CM | POA: Diagnosis present

## 2021-12-31 DIAGNOSIS — G935 Compression of brain: Secondary | ICD-10-CM | POA: Diagnosis present

## 2021-12-31 DIAGNOSIS — Z8249 Family history of ischemic heart disease and other diseases of the circulatory system: Secondary | ICD-10-CM | POA: Diagnosis not present

## 2021-12-31 DIAGNOSIS — C3411 Malignant neoplasm of upper lobe, right bronchus or lung: Secondary | ICD-10-CM

## 2021-12-31 DIAGNOSIS — Z888 Allergy status to other drugs, medicaments and biological substances status: Secondary | ICD-10-CM | POA: Diagnosis not present

## 2021-12-31 DIAGNOSIS — J449 Chronic obstructive pulmonary disease, unspecified: Secondary | ICD-10-CM | POA: Diagnosis not present

## 2021-12-31 DIAGNOSIS — C3492 Malignant neoplasm of unspecified part of left bronchus or lung: Secondary | ICD-10-CM | POA: Diagnosis not present

## 2021-12-31 DIAGNOSIS — E785 Hyperlipidemia, unspecified: Secondary | ICD-10-CM | POA: Diagnosis present

## 2021-12-31 DIAGNOSIS — R531 Weakness: Secondary | ICD-10-CM

## 2021-12-31 DIAGNOSIS — J432 Centrilobular emphysema: Secondary | ICD-10-CM | POA: Diagnosis not present

## 2021-12-31 LAB — CBC WITH DIFFERENTIAL/PLATELET
Abs Immature Granulocytes: 0.03 10*3/uL (ref 0.00–0.07)
Basophils Absolute: 0.1 10*3/uL (ref 0.0–0.1)
Basophils Relative: 1 %
Eosinophils Absolute: 0.1 10*3/uL (ref 0.0–0.5)
Eosinophils Relative: 1 %
HCT: 39.4 % (ref 36.0–46.0)
Hemoglobin: 12.8 g/dL (ref 12.0–15.0)
Immature Granulocytes: 0 %
Lymphocytes Relative: 18 %
Lymphs Abs: 2.2 10*3/uL (ref 0.7–4.0)
MCH: 32.9 pg (ref 26.0–34.0)
MCHC: 32.5 g/dL (ref 30.0–36.0)
MCV: 101.3 fL — ABNORMAL HIGH (ref 80.0–100.0)
Monocytes Absolute: 0.2 10*3/uL (ref 0.1–1.0)
Monocytes Relative: 2 %
Neutro Abs: 9.6 10*3/uL — ABNORMAL HIGH (ref 1.7–7.7)
Neutrophils Relative %: 78 %
Platelets: 296 10*3/uL (ref 150–400)
RBC: 3.89 MIL/uL (ref 3.87–5.11)
RDW: 12.7 % (ref 11.5–15.5)
WBC: 12.3 10*3/uL — ABNORMAL HIGH (ref 4.0–10.5)
nRBC: 0 % (ref 0.0–0.2)

## 2021-12-31 LAB — LACTIC ACID, PLASMA
Lactic Acid, Venous: 2.2 mmol/L (ref 0.5–1.9)
Lactic Acid, Venous: 1.8 mmol/L (ref 0.5–1.9)

## 2021-12-31 LAB — POC URINE PREG, ED: Preg Test, Ur: NEGATIVE

## 2021-12-31 LAB — PROTIME-INR
INR: 1 (ref 0.8–1.2)
Prothrombin Time: 12.8 seconds (ref 11.4–15.2)

## 2021-12-31 LAB — COMPREHENSIVE METABOLIC PANEL
ALT: 40 U/L (ref 0–44)
AST: 61 U/L — ABNORMAL HIGH (ref 15–41)
Albumin: 3.4 g/dL — ABNORMAL LOW (ref 3.5–5.0)
Alkaline Phosphatase: 71 U/L (ref 38–126)
Anion gap: 9 (ref 5–15)
BUN: 13 mg/dL (ref 6–20)
CO2: 28 mmol/L (ref 22–32)
Calcium: 10 mg/dL (ref 8.9–10.3)
Chloride: 101 mmol/L (ref 98–111)
Creatinine, Ser: 0.7 mg/dL (ref 0.44–1.00)
GFR, Estimated: >60 mL/min (ref >60)
Glucose, Bld: 153 mg/dL — ABNORMAL HIGH (ref 70–99)
Potassium: 4 mmol/L (ref 3.5–5.1)
Sodium: 138 mmol/L (ref 135–145)
Total Bilirubin: 0.7 mg/dL (ref 0.3–1.2)
Total Protein: 8.2 g/dL — ABNORMAL HIGH (ref 6.5–8.1)

## 2021-12-31 LAB — URINALYSIS, ROUTINE W REFLEX MICROSCOPIC
Bilirubin Urine: NEGATIVE
Glucose, UA: NEGATIVE mg/dL
Hgb urine dipstick: NEGATIVE
Ketones, ur: NEGATIVE mg/dL
Nitrite: NEGATIVE
Protein, ur: NEGATIVE mg/dL
Specific Gravity, Urine: 1.018 (ref 1.005–1.030)
pH: 5 (ref 5.0–8.0)

## 2021-12-31 LAB — PROCALCITONIN: Procalcitonin: <0.1 ng/mL

## 2021-12-31 MED ORDER — DILTIAZEM HCL ER COATED BEADS 180 MG PO CP24
180.0000 mg | ORAL_CAPSULE | Freq: Every day | ORAL | Status: DC
  Administered 2022-01-01 – 2022-01-03 (×3): 180 mg via ORAL
  Filled 2021-12-31 (×3): qty 1

## 2021-12-31 MED ORDER — LEVETIRACETAM IN NACL 1000 MG/100ML IV SOLN
1000.0000 mg | Freq: Once | INTRAVENOUS | Status: AC
  Administered 2021-12-31: 1000 mg via INTRAVENOUS
  Filled 2021-12-31: qty 100

## 2021-12-31 MED ORDER — ONDANSETRON HCL 4 MG PO TABS: 4.0000 mg | ORAL_TABLET | Freq: Three times a day (TID) | ORAL | Status: DC | PRN

## 2021-12-31 MED ORDER — ADULT MULTIVITAMIN W/MINERALS CH
1.0000 | ORAL_TABLET | Freq: Every day | ORAL | Status: DC
  Administered 2022-01-02 – 2022-01-03 (×2): 1 via ORAL
  Filled 2021-12-31 (×3): qty 1

## 2021-12-31 MED ORDER — DEXAMETHASONE SODIUM PHOSPHATE 4 MG/ML IJ SOLN
4.0000 mg | Freq: Two times a day (BID) | INTRAMUSCULAR | Status: DC
Start: 2022-01-01 — End: 2022-01-01
  Administered 2022-01-01: 4 mg via INTRAVENOUS
  Filled 2021-12-31: qty 1

## 2021-12-31 MED ORDER — ACETAMINOPHEN 160 MG/5ML PO SOLN
650.0000 mg | ORAL | Status: DC | PRN
  Filled 2021-12-31: qty 20.3

## 2021-12-31 MED ORDER — ENOXAPARIN SODIUM 40 MG/0.4ML IJ SOSY
40.0000 mg | PREFILLED_SYRINGE | Freq: Every day | INTRAMUSCULAR | Status: DC
  Administered 2021-12-31 – 2022-01-02 (×3): 40 mg via SUBCUTANEOUS
  Filled 2021-12-31 (×3): qty 0.4

## 2021-12-31 MED ORDER — FOLIC ACID 1 MG PO TABS
1.0000 mg | ORAL_TABLET | Freq: Every day | ORAL | Status: DC
  Administered 2022-01-01 – 2022-01-03 (×3): 1 mg via ORAL
  Filled 2021-12-31 (×4): qty 1

## 2021-12-31 MED ORDER — GADOBUTROL 1 MMOL/ML IV SOLN
6.0000 mL | Freq: Once | INTRAVENOUS | Status: AC | PRN
  Administered 2021-12-31: 6 mL via INTRAVENOUS

## 2021-12-31 MED ORDER — BISMUTH SUBSALICYLATE 262 MG/15ML PO SUSP: 30.0000 mL | Freq: Four times a day (QID) | ORAL | Status: DC | PRN

## 2021-12-31 MED ORDER — LEVOTHYROXINE SODIUM 50 MCG PO TABS
150.0000 ug | ORAL_TABLET | Freq: Every day | ORAL | Status: DC
  Administered 2022-01-01 – 2022-01-03 (×3): 150 ug via ORAL
  Filled 2021-12-31 (×2): qty 1
  Filled 2021-12-31: qty 3

## 2021-12-31 MED ORDER — STROKE: EARLY STAGES OF RECOVERY BOOK: Freq: Once | Status: DC

## 2021-12-31 MED ORDER — ACETAMINOPHEN 650 MG RE SUPP: 650.0000 mg | RECTAL | Status: DC | PRN

## 2021-12-31 MED ORDER — MECLIZINE HCL 25 MG PO TABS
12.5000 mg | ORAL_TABLET | Freq: Three times a day (TID) | ORAL | Status: DC | PRN
  Filled 2021-12-31: qty 0.5

## 2021-12-31 MED ORDER — SODIUM CHLORIDE 0.9 % IV BOLUS
500.0000 mL | Freq: Once | INTRAVENOUS | Status: AC
  Administered 2021-12-31: 500 mL via INTRAVENOUS

## 2021-12-31 MED ORDER — ONDANSETRON HCL 4 MG/2ML IJ SOLN: 4.0000 mg | INTRAMUSCULAR | Status: DC | PRN

## 2021-12-31 MED ORDER — TURMERIC 500 MG PO CAPS: ORAL_CAPSULE | Freq: Every day | ORAL | Status: DC

## 2021-12-31 MED ORDER — SODIUM CHLORIDE 0.9 % IV SOLN: INTRAVENOUS | Status: DC

## 2021-12-31 MED ORDER — TRAZODONE HCL 50 MG PO TABS
25.0000 mg | ORAL_TABLET | Freq: Every evening | ORAL | Status: DC | PRN
  Filled 2021-12-31: qty 1

## 2021-12-31 MED ORDER — SENNOSIDES-DOCUSATE SODIUM 8.6-50 MG PO TABS: 1.0000 | ORAL_TABLET | Freq: Every evening | ORAL | Status: DC | PRN

## 2021-12-31 MED ORDER — ACETAMINOPHEN 325 MG PO TABS: 650.0000 mg | ORAL_TABLET | ORAL | Status: DC | PRN

## 2021-12-31 MED ORDER — DEXAMETHASONE SODIUM PHOSPHATE 10 MG/ML IJ SOLN
10.0000 mg | Freq: Once | INTRAMUSCULAR | Status: AC
  Administered 2021-12-31: 10 mg via INTRAVENOUS
  Filled 2021-12-31: qty 1

## 2021-12-31 NOTE — ED Provider Notes (Signed)
? ?[facility] ?Provider Note ? ? ? Event Date/Time  ? First MD Initiated Contact with Patient 12/31/21 1749   ?  (approximate) ? ? ?History  ? ?Weakness ? ? ?HPI ? ?Nichole Park is a 61 y.o. female with past medical history of hypertension, hyperlipidemia, A-fib, metastatic lung adenocarcinoma, here with right-sided numbness and weakness.  The patient states that for the last day or 2, she has had increasing weakness and numbness of her right arm and leg.  She had chemotherapy, and states her symptoms mildly worsened afterwards.  Throughout the last 24 hours, she now essentially cannot move her right leg and feels very weak in her right arm.  She has diminished sensation throughout.  She subsequently sent for evaluation.  She had some mild headaches.  Reports mild intermittent blurred vision.  She did also have an episode of vertigo several days ago, but this is largely resolved.  No recent medication changes.  No current headache. ?  ? ? ?Physical Exam  ? ?Triage Vital Signs: ?ED Triage Vitals  ?Enc Vitals Group  ?   BP 12/31/21 1628 133/88  ?   Pulse Rate 12/31/21 1628 (!) 113  ?   Resp 12/31/21 1628 15  ?   Temp 12/31/21 1628 99.4 ?F (37.4 ?C)  ?   Temp Source 12/31/21 1628 Oral  ?   SpO2 12/31/21 1628 94 %  ?   Weight --   ?   Height --   ?   Head Circumference --   ?   Peak Flow --   ?   Pain Score 12/31/21 1703 0  ?   Pain Loc --   ?   Pain Edu? --   ?   Excl. in [place]? --   ? ? ?Most recent vital signs: ?Vitals:  ? 01/01/22 0900 01/01/22 0930  ?BP: 118/80 116/82  ?Pulse: (!) 104 97  ?Resp: (!) 21 16  ?Temp:    ?SpO2: 99% 98%  ? ? ? ?General: Awake, no distress.  ?CV:  Good peripheral perfusion.  No murmurs or rubs. ?Resp:  Normal effort.  Lungs clear bilaterally. ?Abd:  No distention.  No tenderness. ?Other:  Diminished sensation to light touch throughout the right upper and lower extremities.  Strength 4 out of 5 right upper extremity, 4 out of 5 in the right lower extremity.  Positive  pronator drift on the right.  Cranial nerves II through XII intact. ? ? ?ED Results / Procedures / Treatments  ? ?Labs ?(all labs ordered are listed, but only abnormal results are displayed) ?Labs Reviewed  ?COMPREHENSIVE METABOLIC PANEL - Abnormal; Notable for the following components:  ?    Result Value  ? Glucose, Bld 153 (*)   ? Total Protein 8.2 (*)   ? Albumin 3.4 (*)   ? AST 61 (*)   ? All other components within normal limits  ?LACTIC ACID, PLASMA - Abnormal; Notable for the following components:  ? Lactic Acid, Venous 2.2 (*)   ? All other components within normal limits  ?CBC WITH DIFFERENTIAL/PLATELET - Abnormal; Notable for the following components:  ? WBC 12.3 (*)   ? MCV 101.3 (*)   ? Neutro Abs 9.6 (*)   ? All other components within normal limits  ?URINALYSIS, ROUTINE W REFLEX MICROSCOPIC - Abnormal; Notable for the following components:  ? Color, Urine YELLOW (*)   ? APPearance HAZY (*)   ? Leukocytes,Ua MODERATE (*)   ? Bacteria, UA  RARE (*)   ? Non Squamous Epithelial PRESENT (*)   ? All other components within normal limits  ?LIPID PANEL - Abnormal; Notable for the following components:  ? HDL 36 (*)   ? LDL Cholesterol 110 (*)   ? All other components within normal limits  ?CULTURE, BLOOD (ROUTINE X 2)  ?CULTURE, BLOOD (ROUTINE X 2)  ?LACTIC ACID, PLASMA  ?PROTIME-INR  ?PROCALCITONIN  ?HEMOGLOBIN A1C  ?POC URINE PREG, ED  ? ? ? ?EKG ? ? ? ?RADIOLOGY ?CT head: Worsened edema within the left frontal and parietal white matter consistent with disease progression ?Chest x-ray: Multiple subtle nodular areas in both lungs, possible pneumonia ?CT chest: Interval increase in lung tumor burden, no pneumonia ? ? ?I also independently reviewed and agree wit radiologist interpretations. ? ? ?PROCEDURES: ? ?Critical Care performed: Yes, see critical care procedure note(s) ? ?.Critical Care ?Performed by: Duffy Bruce, MD ?Authorized by: Duffy Bruce, MD  ? ?Critical care provider statement:  ?  Critical  care time (minutes):  30 ?  Critical care time was exclusive of:  Separately billable procedures and treating other patients ?  Critical care was necessary to treat or prevent imminent or life-threatening deterioration of the following conditions:  Cardiac failure, circulatory failure, CNS failure or compromise and respiratory failure ?  Critical care was time spent personally by me on the following activities:  Development of treatment plan with patient or surrogate, discussions with consultants, evaluation of patient's response to treatment, examination of patient, ordering and review of laboratory studies, ordering and review of radiographic studies, ordering and performing treatments and interventions, pulse oximetry, re-evaluation of patient's condition and review of old charts ? ? ? ? ?MEDICATIONS ORDERED IN ED: ?Medications  ?diltiazem (CARDIZEM CD) 24 hr capsule 180 mg (180 mg Oral Given 01/01/22 0823)  ?levothyroxine (SYNTHROID) tablet 150 mcg (150 mcg Oral Given 01/01/22 7989)  ?bismuth subsalicylate (PEPTO BISMOL) 262 MG/15ML suspension 30 mL (has no administration in time range)  ?meclizine (ANTIVERT) tablet 12.5 mg (has no administration in time range)  ?ondansetron (ZOFRAN) tablet 4 mg (has no administration in time range)  ?folic acid (FOLVITE) tablet 1 mg (1 mg Oral Given 01/01/22 0823)  ?multivitamin with minerals tablet 1 tablet (1 tablet Oral Not Given 01/01/22 0824)  ? stroke: early stages of recovery book (has no administration in time range)  ?acetaminophen (TYLENOL) tablet 650 mg (has no administration in time range)  ?  Or  ?acetaminophen (TYLENOL) 160 MG/5ML solution 650 mg (has no administration in time range)  ?  Or  ?acetaminophen (TYLENOL) suppository 650 mg (has no administration in time range)  ?senna-docusate (Senokot-S) tablet 1 tablet (has no administration in time range)  ?enoxaparin (LOVENOX) injection 40 mg (40 mg Subcutaneous Given 12/31/21 2235)  ?0.9 %  sodium chloride infusion (  Intravenous New Bag/Given 12/31/21 2234)  ?ondansetron (ZOFRAN) injection 4 mg (has no administration in time range)  ?traZODone (DESYREL) tablet 25 mg (has no administration in time range)  ?dexamethasone (DECADRON) injection 4 mg (4 mg Intravenous Given 01/01/22 1008)  ?dexamethasone (DECADRON) injection 10 mg (10 mg Intravenous Given 12/31/21 1855)  ?sodium chloride 0.9 % bolus 500 mL (0 mLs Intravenous Stopped 12/31/21 2052)  ?levETIRAcetam (KEPPRA) IVPB 1000 mg/100 mL premix (0 mg Intravenous Stopped 12/31/21 1925)  ?gadobutrol (GADAVIST) 1 MMOL/ML injection 6 mL (6 mLs Intravenous Contrast Given 12/31/21 2208)  ? ? ? ?IMPRESSION / MDM / ASSESSMENT AND PLAN / ED COURSE  ?I reviewed the triage vital signs  and the nursing notes. ?             ?               ? ? ?The patient is on the cardiac monitor to evaluate for evidence of arrhythmia and/or significant heart rate changes. ? ? ?MDM:  ?61 yo F with h/o metastatic lung adenoCA here with acute right sided weakness. CT head shows vasogenic edema likely 2/2 new or worsening brain mets, unfortunately. No hemorrhage noted. Pt is protecting airway, otherwise awake and alert. Discussed with Dr. Izora Ribas of [facility]. Will start on IV decadron, IV keppra load. Otherwise, pt is overall non toxic and well appearing. CBC shows mild leukocytosis, denies any fevers or infectious sx. CMP unremarkable, normal renal function. Procal is negative. LA is slightly elevated, likely dehydration and possibly from liver mets. No signs to suggest sepsis clinically., Will admit to medicine.  ? ?Reviewed prior Oncology notes, including recent MRI which showed small brain mets, though not necessarily in the distribution of edema which is concerning. ? ? ?MEDICATIONS GIVEN IN ED: ?Medications  ?diltiazem (CARDIZEM CD) 24 hr capsule 180 mg (180 mg Oral Given 01/01/22 0823)  ?levothyroxine (SYNTHROID) tablet 150 mcg (150 mcg Oral Given 01/01/22 4825)  ?bismuth subsalicylate (PEPTO BISMOL) 262 MG/15ML  suspension 30 mL (has no administration in time range)  ?meclizine (ANTIVERT) tablet 12.5 mg (has no administration in time range)  ?ondansetron (ZOFRAN) tablet 4 mg (has no administration in time range)

## 2021-12-31 NOTE — ED Triage Notes (Signed)
First Nurse Note;  ?Pt via EMS from home. Pt c/o generalized weakness. States her R hand is not "coordinated" and her hand writing is funny. States R foot is tingling. Pt is a cancer patient.  ?Pt is A&Ox4 and NAD ?100/70 ?97% on RA ?112 HR ?156 CBG  ? ?

## 2021-12-31 NOTE — Telephone Encounter (Signed)
Pt contacted and offered appointment in [facility]. She did not feel that symptoms were urgent enough to necessitate coming in today, but was agreeable to being seen on Thursday. Discussed ED triggers, or to call us back with any questions/concerns. Appointments scheduled.  ?

## 2021-12-31 NOTE — H&P (Addendum)
?  ?  ?[place] ? ? ?PATIENT NAME: Nichole Park   ? ?MR#:  [id number] ? ?DATE OF BIRTH:  1961/02/13 ? ?DATE OF ADMISSION:  12/31/2021 ? ?PRIMARY CARE PHYSICIAN: [facility]  ? ?Patient is coming from: Home ? ?REQUESTING/REFERRING PHYSICIAN: Duffy Bruce, MD ? ?CHIEF COMPLAINT:  ? ?Chief Complaint  ?Patient presents with  ?? Weakness  ? ? ?HISTORY OF PRESENT ILLNESS:  ?Nichole Park is a 61 y.o. 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  He was able to follow commands.  Her symptoms have been worsening over the last.  Over the last 24 hours she has not been able to move her right leg and feels very weak in her right arm as well with diminished station throughout.  She admits to mild headache as well as mild early blurred vision and had an episode of vertigo several days ago but has resolved.  No new medications.  No chest pain or palpitations. She has mild headache with no dizziness or blurred vision.  No cough or wheezing.  No bleeding diathesis no dysuria, oliguria or hematuria or flank pain.  No nausea or vomiting or abdominal pain.  No dysuria, oliguria or hematuria or flank pain. ? ?ED Course: When she came to the ER signs were within normal.  Labs revealed albumin of 3.4 and AST 61 glucose of 153 with otherwise CMP.  Lactic acid was 2.2 and later 1.8 and procalcitonin less than 0.1 12.3 with neutrophilia.  PT was 12.8 and INR 1. ? ?EKG as reviewed by me : EKG showed sinus tachycardia with a rate of 109. ?Imaging: Chest x-ray showed remote demonstrated right lung mass better characterize the prior CT there were multiple subtle  nodular areas of consolidation in both lungs that could represent pneumonia metastasis or artifact.   ? ?Chest CT was recommendedChest CT without contrast revealed the following: ?1. Interval increase in size of large spiculated right upper lobe ?lung mass since prior CT concerning for disease progression. Interim ?development of multiple bilateral lung nodules, also concerning for ?progression of disease. Increased left axillary adenopathy also ?concerning for progression of disease. ?2. Emphysema ?3. Aortic Atherosclerosis ? ?Head CT scan without contrast revealed: ?Worsened hypodensity/edema within the left frontal and parietal ?white matter with minimal 2 mm midline shift to the right, ?concerning for disease progression. Follow-up MRI with and without ?contrast is recommended. ? ?The patient was given IV Decadron, 1 g of IV Keppra and 500 mL IV normal saline.  Contact was made with Dr. Cari Caraway who recommended brain MRI with contrast.  The patient will be admitted to a medical telemetry bed for further evaluation and management. ? ?PAST MEDICAL HISTORY:  ? ?Past Medical History:  ?Diagnosis Date  ?? Graves disease   ?? Malignant pericardial effusion   ?? Paroxysmal atrial fibrillation (HCC)   ?? Primary lung adenocarcinoma ([place])   ? ? ?PAST SURGICAL HISTORY:  ? ?Past Surgical History:  ?Procedure Laterality Date  ?? IR CV LINE INJECTION  06/19/2021  ?? IR IMAGING GUIDED PORT INSERTION  04/27/2021  ?? PERICARDIOCENTESIS N/A 03/27/2021  ? Procedure: PERICARDIOCENTESIS;  Surgeon: Nelva Bush, MD;  Location: [facility];  Service: Cardiovascular;  Laterality: N/A;  ?? TUBAL LIGATION    ? ? ?SOCIAL HISTORY:  ? ?Social History  ? ?Tobacco Use  ?? Smoking status: Former  ?  Packs/day: 1.00  ?  Years: 40.00  ?  Pack years: 40.00  ?  Types: Cigarettes  ?? Smokeless tobacco: Never  ?Substance Use Topics  ?? Alcohol use: No  ? ? ?FAMILY HISTORY:  ? ?Family History  ?Problem Relation Age of Onset  ??  Peripheral Artery Disease Mother   ?? Breast cancer Neg Hx   ? ? ?DRUG ALLERGIES:  ? ?Allergies  ?Allergen Reactions  ?? Diazepam Other (See Comments)  ?  Very high emotionally and very low emotionally, hyperventilate and passes out ?Very high emotionally and very low emotionally, hyperventilate and passes out ?  ?? Keytruda [Pembrolizumab] Rash  ?? Metoprolol   ?? Tapazole [Methimazole]   ? ? ?REVIEW OF SYSTEMS:  ? ?ROS ?As per history of present illness. All pertinent systems were reviewed above. Constitutional, HEENT, cardiovascular, respiratory, GI, GU, musculoskeletal, neuro, psychiatric, endocrine, integumentary and hematologic systems were reviewed and are otherwise negative/unremarkable except for positive findings mentioned above in the HPI. ? ? ?MEDICATIONS AT HOME:  ? ?Prior to Admission medications   ?Medication Sig Start Date End Date Taking? Authorizing Provider  ?bismuth subsalicylate (PEPTO BISMOL) 262 MG/15ML suspension Take 30 mLs by mouth every 6 (six) hours as needed.    [provider]  ?diltiazem (CARDIZEM CD) 180 MG 24 hr capsule Take 1 capsule (180 mg total) by mouth daily. 11/28/21 02/26/22  Rise Mu, PA-C  ?folic acid (FOLVITE) 1 MG tablet Take 1 tablet (1 mg total) by mouth once daily. 10/15/21   Cammie Sickle, MD  ?levothyroxine (SYNTHROID) 150 MCG tablet Take 1 tablet (150 mcg total) by mouth daily before breakfast. Take on empty stomach. 10/04/21   Verlon Au, NP  ?lidocaine-prilocaine (EMLA) cream Apply 1 application topically daily as needed. 11/15/21   Cammie Sickle, MD  ?meclizine (ANTIVERT) 25 MG tablet 1 pill at night as needed; if dizziness not improved can take one pill every 8 hours as needed/tolerated. 12/28/21   Cammie Sickle, MD  ?Multiple Vitamin (MULTI-VITAMINS) TABS Take 1 tablet by mouth daily.    [provider]  ?ondansetron (ZOFRAN) 4 MG tablet Take 2 tablets (8mg  total) by mouth once every 8 hours as needed for nausea and  vomiting. 04/16/21   Cammie Sickle, MD  ?TURMERIC PO Take 1 tablet by mouth daily.    [provider]  ? ?  ? ?VITAL SIGNS:  ?Blood pressure 132/87, pulse 97, temperature 98.3 ?F (36.8 ?C), resp. rate 20, SpO2 96 %. ? ?PHYSICAL EXAMINATION:  ?Physical Exam ? ?GENERAL:  61 y.o.-year-old Caucasian female patient lying in the bed with no acute distress.  ?EYES: Pupils equal, round, reactive to light and accommodation. No scleral icterus. Extraocular muscles intact.  ?HEENT: Head atraumatic, normocephalic. Oropharynx and nasopharynx clear.  ?NECK:  Supple, no jugular venous distention. No thyroid enlargement, no tenderness.  ?LUNGS: Normal breath sounds bilaterally, no wheezing, rales,rhonchi or crepitation. No use of accessory muscles of respiration.  ?CARDIOVASCULAR: Regular rate and rhythm, S1, S2 normal. No murmurs, rubs, or gallops.  ?ABDOMEN: Soft, nondistended, nontender. Bowel sounds present. No organomegaly or mass.  ?EXTREMITIES: No pedal edema, cyanosis, or clubbing.  ?NEUROLOGIC: Cranial nerves II through XII are intact. Muscle strength 3/5 in the right upper and lower extremity compared to 5/5 in the left upper  and lower extremities. Sensation intact. Gait not checked.  ?PSYCHIATRIC: The patient is alert and oriented x 3.  Normal affect and good eye contact. ?SKIN: No obvious rash, lesion, or ulcer.  ? ?LABORATORY PANEL:  ? ?CBC ?Recent Labs  ?Lab 12/31/21 ?1639  ?WBC 12.3*  ?HGB 12.8  ?HCT 39.4  ?PLT 296  ? ?------------------------------------------------------------------------------------------------------------------ ? ?Chemistries  ?Recent Labs  ?Lab 12/31/21 ?1639  ?NA 138  ?K 4.0  ?CL 101  ?CO2 28  ?GLUCOSE 153*  ?BUN 13  ?CREATININE 0.70  ?CALCIUM 10.0  ?AST 61*  ?ALT 40  ?ALKPHOS 71  ?BILITOT 0.7  ? ?------------------------------------------------------------------------------------------------------------------ ? ?Cardiac Enzymes ?No results for input(s): TROPONINI in the last 168  hours. ?------------------------------------------------------------------------------------------------------------------ ? ?RADIOLOGY:  ?DG Chest 2 View ? ?Result Date: 12/31/2021 ?CLINICAL DATA:  Suspected Seps

## 2021-12-31 NOTE — ED Provider Triage Note (Signed)
Emergency Medicine Provider Triage Evaluation Note ? ?Nichole Park , a 61 y.o. female  was evaluated in triage.  Pt complains of with history of lung cancer and known metastatic disease to the brain presents with confusion, numbness and tingling.  Unsure of when it started.  Did not start today.. ? ?Review of Systems  ?Positive:  ?Negative:  ? ?Physical Exam  ?BP 133/88 (BP Location: Right Arm)   Pulse (!) 113   Temp 99.4 ?F (37.4 ?C) (Oral)   Resp 15   SpO2 94%  ?Gen:   Awake, no distress   ?Resp:  Normal effort  ?MSK:   Moves extremities without difficulty  ?Other:   ? ?Medical Decision Making  ?Medically screening exam initiated at 4:56 PM.  Appropriate orders placed.  TEMICA RIGHETTI was informed that the remainder of the evaluation will be completed by another provider, this initial triage assessment does not replace that evaluation, and the importance of remaining in the ED until their evaluation is complete. ? ?We will start stroke work-up along with sepsis due to patient's past medical history.  She is tachycardic upon arrival.  Encouraged nursing staff to draw PT/PTT in case of stroke in need of heparin ?  ?Versie Starks, PA-C ?12/31/21 1657 ? ?

## 2021-12-31 NOTE — Telephone Encounter (Signed)
Ordered Right dopples ?

## 2021-12-31 NOTE — ED Notes (Signed)
Pt given sandwich tray and gingerale at this time per her request. ?

## 2021-12-31 NOTE — ED Notes (Signed)
Pt taken to MRI at this time

## 2021-12-31 NOTE — ED Triage Notes (Addendum)
Pt to ED via [facility] from home. Pt reports daughter called EMS due to loss of function in R hand and right foot tingling. Pt was screened by EMS and pt didn't know the year or name. Pt reports symptoms did not start today.  ? ?Pt with hx lung cancer that metastasized to the brain.  ? ? ?

## 2021-12-31 NOTE — ED Notes (Signed)
Pt states that right foot is still tingling and numb, with finger strokes to foot, pt feels slight touch on bottom and no feeling on top. Pt able to follow commands and flex and extend foot.  Denies tingling, numbness, with left foot. Right hand is able to flex and extend at command.  Pt denies tingling, numbness in hand at this time.  Right lower leg weak and numb per pt. Able to flex and extend at command. Denies tingling in rt lower leg. ?

## 2021-12-31 NOTE — Progress Notes (Signed)
PHARMACIST - PHYSICIAN ORDER COMMUNICATION ? ?CONCERNING: P&T Medication Policy on Herbal Medications ? ?DESCRIPTION:  This patient?s order for:  Turmeric capsules  has been noted. ? ?This product(s) is classified as an ?herbal? or natural product. ?Due to a lack of definitive safety studies or FDA approval, nonstandard manufacturing practices, plus the potential risk of unknown drug-drug interactions while on inpatient medications, the Pharmacy and Therapeutics Committee does not permit the use of ?herbal? or natural products of this type within [facility]. ?  ?ACTION TAKEN: ?The pharmacy department is unable to verify this order at this time and your patient has been informed of this safety policy. ?Please reevaluate patient?s clinical condition at discharge and address if the herbal or natural product(s) should be resumed at that time. ? ?

## 2021-12-31 NOTE — Telephone Encounter (Signed)
Over the weekend she started having numbness in her right ankle that does radiate down her foot but not up the leg.  The numbness did cause difficulty walking.  Has not noticed any swelling, redness, or pain in the ankle/leg.  Thinks it may be a little better today but hasn't been walking around much today. ? ?States that her daughter thinks she has a blood clot. ?

## 2021-12-31 NOTE — Telephone Encounter (Signed)
Call returned to patient and she does not have any question regarding these lab results.  She did have an appt with MD same day as labs resulted. ?

## 2022-01-01 DIAGNOSIS — C3490 Malignant neoplasm of unspecified part of unspecified bronchus or lung: Secondary | ICD-10-CM

## 2022-01-01 DIAGNOSIS — C3492 Malignant neoplasm of unspecified part of left bronchus or lung: Secondary | ICD-10-CM

## 2022-01-01 DIAGNOSIS — C7931 Secondary malignant neoplasm of brain: Secondary | ICD-10-CM

## 2022-01-01 DIAGNOSIS — I10 Essential (primary) hypertension: Secondary | ICD-10-CM | POA: Diagnosis not present

## 2022-01-01 DIAGNOSIS — E039 Hypothyroidism, unspecified: Secondary | ICD-10-CM

## 2022-01-01 DIAGNOSIS — R531 Weakness: Secondary | ICD-10-CM

## 2022-01-01 DIAGNOSIS — I480 Paroxysmal atrial fibrillation: Secondary | ICD-10-CM

## 2022-01-01 LAB — HEMOGLOBIN A1C
Hgb A1c MFr Bld: 5.3 % (ref 4.8–5.6)
Mean Plasma Glucose: 105.41 mg/dL

## 2022-01-01 LAB — LIPID PANEL
Cholesterol: 160 mg/dL (ref 0–200)
HDL: 36 mg/dL — ABNORMAL LOW (ref >40)
LDL Cholesterol: 110 mg/dL — ABNORMAL HIGH (ref 0–99)
Total CHOL/HDL Ratio: 4.4 RATIO
Triglycerides: 69 mg/dL (ref <150)
VLDL: 14 mg/dL (ref 0–40)

## 2022-01-01 MED ORDER — DEXAMETHASONE SODIUM PHOSPHATE 4 MG/ML IJ SOLN
4.0000 mg | Freq: Four times a day (QID) | INTRAMUSCULAR | Status: DC
  Administered 2022-01-01 – 2022-01-03 (×8): 4 mg via INTRAVENOUS
  Filled 2022-01-01 (×8): qty 1

## 2022-01-01 MED ORDER — BOOST / RESOURCE BREEZE PO LIQD CUSTOM
1.0000 | Freq: Three times a day (TID) | ORAL | Status: DC
  Administered 2022-01-01 – 2022-01-03 (×7): 1 via ORAL

## 2022-01-01 NOTE — Assessment & Plan Note (Signed)
-   The patient will be continued on Synthroid. ?

## 2022-01-01 NOTE — Assessment & Plan Note (Addendum)
-   The patient has brain metastasis with subsequent right-sided hemiparesis. ?- She will be admitted to a medical telemetry bed. ?- We will follow neurochecks every 4 hours for 24 hours. ?- We will continue her on IV Decadron. ?- Neurosurgery consult to be obtained. ?- Dr. Izora Ribas was notified about the patient. ?

## 2022-01-01 NOTE — Evaluation (Signed)
Physical Therapy Evaluation ?Patient Details ?Name: Nichole Park ?MRN: [id number] ?DOB: February 23, 1961 ?Today's Date: 01/01/2022 ? ?History of Present Illness ? Nichole Park is a 60 y.o. 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 ?  ?Clinical Impression ? Pt admitted with above diagnosis. Pt received supine in bed, agreeable to PT services. Pt reports typically she is indep with household mobility and ADL's/IADL's. In the last few days has had to relying on boyfriend for balance due to onset of R sided weakness. Pt demonstrating mod-I for bed mobility with noted decreased sensation to LT in RLE and grossly 3/5 MMT in RUE/RLE and 2/5 MMT in ankle DF on R side. Pt able to stand with supervision to RW and ambulate with step to gait with decreased RLE foot clearance and step to gait relying on R hip flexion to clear foot. Overall good stability with RW able to ambulate in room with supervision but requiring intermittent VC's for safe use of RW such as hand placement, turning with RW, and transfers with standing to sitting. Pt returning to supine with all needs in reach. Education provided on benefits of RW and w/c for assisting in balance/ambulation and energy conservation for community distances. Education provided on possible benefits of OP PT after [place] if R sided deficits persist. Anticipate pt will be safe to d/c home with [facility] services pending stairs assessment.  Pt currently with functional limitations due to the deficits listed below (see PT Problem List). Pt will benefit from skilled PT to increase their independence and safety with mobility to allow discharge  to the venue listed below.     ? ?Recommendations for follow up therapy are one component of a multi-disciplinary discharge planning process, led by the attending physician.  Recommendations may be updated based on patient status, additional functional criteria and insurance authorization. ? ?Follow Up Recommendations Home health PT ? ?  ?Assistance Recommended at Discharge Frequent or constant Supervision/Assistance  ?Patient can return home with the following ? A little help with walking and/or transfers;Assistance with cooking/housework;Assist for transportation;A little help with bathing/dressing/bathroom;Help with stairs or ramp for entrance ? ?  ?Equipment Recommendations Rolling walker (2 wheels);BSC/3in1;Wheelchair (measurements PT)  ?Recommendations for Other Services ?    ?  ?Functional Status Assessment Patient has had a recent decline in their functional status and demonstrates the ability to make significant improvements in function in a reasonable and predictable amount of time.  ? ?  ?Precautions / Restrictions Precautions ?Precautions: Fall ?Restrictions ?Weight Bearing Restrictions: No  ? ?  ? ?Mobility ? Bed Mobility ?Overal bed mobility: Modified Independent ?  ?  ?  ?  ?  ?  ?  ?Patient Response: Cooperative ? ?Transfers ?Overall transfer level: Needs assistance ?Equipment used: Rolling walker (2 wheels) ?Transfers: Sit to/from Stand ?Sit to Stand: Supervision ?  ?  ?  ?  ?  ?  ?  ? ?Ambulation/Gait ?Ambulation/Gait assistance: Min guard ?Gait Distance (Feet): 40 Feet ?Assistive device: Rolling walker (2 wheels) ?Gait Pattern/deviations: Step-to pattern, Decreased step length - right, Decreased step length - left, Decreased dorsiflexion - right ?  ?  ?  ?General Gait Details: Pt relying on utilizing increased R hip flexion  for R foot clearance ? ?Stairs ?  ?  ?  ?  ?  ? ?Wheelchair Mobility ?  ? ?Modified Rankin (Stroke Patients Only) ?  ? ?  ? ?Balance Overall balance assessment: Needs  assistance ?Sitting-balance support: Feet unsupported, Bilateral upper extremity supported ?Sitting balance-Leahy Scale: Fair ?  ?  ?  ?Standing balance-Leahy Scale: Fair ?Standing balance comment: able to stand and maintain static standing without AD ?  ?  ?  ?  ?  ?  ?  ?  ?  ?  ?  ?   ? ? ? ?Pertinent Vitals/Pain Pain Assessment ?Pain Assessment: No/denies pain  ? ? ?Home Living Family/patient expects to be discharged to:: Private residence ?Living Arrangements: Spouse/significant other ?Available Help at Discharge: Family;Available 24 hours/day ?Type of Home: Mobile home ?Home Access: Stairs to enter ?Entrance Stairs-Rails: None ?Entrance Stairs-Number of Steps: 1-2 STE from carport ?  ?Home Layout: One level ?Home Equipment: Rolling Walker (2 wheels);BSC/3in1;Wheelchair - manual ?   ?  ?Prior Function Prior Level of Function : Independent/Modified Independent ?  ?  ?  ?  ?  ?  ?  ?  ?  ? ? ?Hand Dominance  ? Dominant Hand: Right ? ?  ?Extremity/Trunk Assessment  ? Upper Extremity Assessment ?Upper Extremity Assessment: Generalized weakness;RUE deficits/detail ?RUE Deficits / Details: Grossly 3/5 MMT ?RUE Coordination: decreased gross motor ?  ? ?Lower Extremity Assessment ?Lower Extremity Assessment: Generalized weakness;RLE deficits/detail ?RLE Deficits / Details: Grossly 3/5 MMT ?RLE Sensation: decreased light touch (Mainly proximal and medial dermatomal distribution) ?  ? ?Cervical / Trunk Assessment ?Cervical / Trunk Assessment: Normal  ?Communication  ? Communication: No difficulties  ?Cognition Arousal/Alertness: Awake/alert ?Behavior During Therapy: [facility] for tasks assessed/performed ?Overall Cognitive Status: Within Functional Limits for tasks assessed ?  ?  ?  ?  ?  ?  ?  ?  ?  ?  ?  ?  ?  ?  ?  ?  ?  ?  ?  ? ?  ?General Comments   ? ?  ?Exercises Other Exercises ?Other Exercises: Role of PT in acute setting, d/c recs, DME  ? ?Assessment/Plan  ?  ?PT Assessment Patient needs continued PT services  ?PT  Problem List Decreased strength;Decreased knowledge of use of DME;Decreased activity tolerance;Decreased balance;Decreased coordination;Impaired sensation ? ?   ?  ?PT Treatment Interventions DME instruction;Balance training;Gait training;Neuromuscular re-education;Stair training;Functional mobility training;Patient/family education;Therapeutic activities;Therapeutic exercise   ? ?PT Goals (Current goals can be found in the Care Plan section)  ?Acute Rehab PT Goals ?Patient Stated Goal: go home ?PT Goal Formulation: With patient ?Time For Goal Achievement: 01/15/22 ?Potential to Achieve Goals: Good ? ?  ?Frequency 7X/week ?  ? ? ?Co-evaluation   ?  ?  ?  ?  ? ? ?  ?AM-PAC PT "6 Clicks" Mobility  ?Outcome Measure Help needed turning from your back to your side while in a flat bed without using bedrails?: None ?Help needed moving from lying on your back to sitting on the side of a flat bed without using bedrails?: None ?Help needed moving to and from a bed to a chair (including a wheelchair)?: A Little ?Help needed standing up from a chair using your arms (e.g., wheelchair or bedside chair)?: A Little ?Help needed to walk in hospital room?: A Little ?Help needed climbing 3-5 steps with a railing? : A Lot ?6 Click Score: 19 ? ?  ?End of Session Equipment Utilized During Treatment: Gait belt ?Activity  Tolerance: Patient tolerated treatment well ?Patient left: in bed;with call bell/phone within reach;with family/visitor present ?Nurse Communication: Mobility status ?PT Visit Diagnosis: Unsteadiness on feet (R26.81);Difficulty in walking, not elsewhere classified (R26.2);Other symptoms and signs involving the nervous system (R29.898);Muscle weakness (generalized) (M62.81);History of falling (Z91.81) ?  ? ?Time: 0301-3143 ?PT Time Calculation (min) (ACUTE ONLY): 31 min ? ? ?Charges:   PT Evaluation ?$PT Eval Moderate Complexity: 1 Mod ?PT Treatments ?$Gait Training: 8-22 mins ?  ?   ? ?Salem Caster. Fairly IV, PT,  DPT ?Physical Therapist- [place]  ?[facility]  ?01/01/2022, 10:45 AM ? ?

## 2022-01-01 NOTE — ED Notes (Signed)
Pt breakfast tray at bedside ?

## 2022-01-01 NOTE — Assessment & Plan Note (Signed)
-   We will continue Cardizem CD ?

## 2022-01-01 NOTE — Progress Notes (Addendum)
SLP Cancellation Note ? ?Patient Details ?Name: Nichole Park ?MRN: [id number] ?DOB: [birth year] ? ? ?Cancelled treatment:       Reason Eval/Treat Not Completed: SLP screened, no needs identified, will sign off (chart reviewed; consulted NSG then met w/ pt and family in room) ?Pt denied any difficulty swallowing and is currently on a regular diet; tolerates swallowing pills w/ water per NSG. She fed self several sips of water via straw during visit w/ no overt s/s of aspiration.  ?Pt conversed in full conversation w/ this Clinician and family w/out expressive/receptive deficits noted; pt denied any speech deficits. Speech clear, fully intelligible. However, pt endorsed an intermittent, mild feeling of slower thought processing, when ordering food items for dinner tonight; she had No difficulty w/ verbal expression of this situation and her insight that the brain edema may be impacting this situation/issue. Endorsed agreement w/ such, especially since she described these intermittent feelings in the presence of recent "parietal metastases with significant edema", as per the MRI performed at admit (per Oncologist's note today -- see further note for details). ?Pt is receiving Steroid txs at this time. Recommend pt continue to monitor this issue and f/u w/ Oncologist as well as Neurologist for prognosis/questions; ST services can be available post return home(home health) if any deficits continue to occur. ?No further Acute skilled ST services indicated as pt appears able to easily make wants/needs known in verbal communication w/ others. Pt/family agreed. NSG to reconsult if any change in status while admitted.   ? ? ? ? ? ? ?Orinda Kenner, MS, CCC-SLP ?Speech Language Pathologist ?Rehab Services; [place] ?[phone] (ascom) ?Ashten Prats ?01/01/2022, 4:25 PM ?

## 2022-01-01 NOTE — ED Notes (Signed)
[name] IV infiltrated. This RN and Art gallery manager attempting new IV access without success. IV team consult placed.  ?

## 2022-01-01 NOTE — Consult Note (Signed)
[place] ?CONSULT NOTE ? ?Patient Care Team: ?Headrick as PCP - General ?End, Harrell Gave, MD as PCP - Cardiology (Cardiology) ?Telford Nab, RN as Sales executive ? ?CHIEF COMPLAINTS/PURPOSE OF CONSULTATION: Metastatic lung cancer; new onset of right upper lower extremity weakness. ? ?HISTORY OF PRESENTING ILLNESS:  ?Nichole Park 61 y.o.  female metastatic adenocarcinoma of the lung-currently on chemotherapy is currently admitted to hospital for right-sided weakness. ? ?Patient states approximately 2 days ago noted to have fairly sudden onset of weakness of the right upper and lower extremities.  Complains of numbness.  Patient had been unsteady; but no falls.  Complains of i increasing headaches. ? ?Patient denies nausea vomiting.  No fever no chills.  Chronic shortness of breath.  No worsening cough. ? ?Review of Systems  ?Constitutional:  Positive for malaise/fatigue. Negative for chills, diaphoresis, fever and weight loss.  ?HENT:  Negative for nosebleeds and sore throat.   ?Eyes:  Negative for double vision.  ?Respiratory:  Positive for shortness of breath. Negative for cough, hemoptysis, sputum production and wheezing.   ?Cardiovascular:  Negative for chest pain, palpitations, orthopnea and leg swelling.  ?Gastrointestinal:  Negative for abdominal pain, blood in stool, constipation, diarrhea, heartburn, melena, nausea and vomiting.  ?Genitourinary:  Negative for dysuria, frequency and urgency.  ?Musculoskeletal:  Negative for back pain and joint pain.  ?Skin: Negative.  Negative for itching and rash.  ?Neurological:  Positive for dizziness, tingling, focal weakness and weakness. Negative for headaches.  ?Endo/Heme/Allergies:  Does not bruise/bleed easily.  ?Psychiatric/Behavioral:  Negative for depression. The patient is not nervous/anxious and does not have insomnia.    ? ?MEDICAL HISTORY:  ?Past Medical History:  ?Diagnosis Date  ? Graves disease   ? Malignant  pericardial effusion   ? Paroxysmal atrial fibrillation (HCC)   ? Primary lung adenocarcinoma ([place])   ? ? ?SURGICAL HISTORY: ?Past Surgical History:  ?Procedure Laterality Date  ? IR CV LINE INJECTION  06/19/2021  ? IR IMAGING GUIDED PORT INSERTION  04/27/2021  ? PERICARDIOCENTESIS N/A 03/27/2021  ? Procedure: PERICARDIOCENTESIS;  Surgeon: Nelva Bush, MD;  Location: [facility];  Service: Cardiovascular;  Laterality: N/A;  ? TUBAL LIGATION    ? ? ?SOCIAL HISTORY: ?Social History  ? ?Socioeconomic History  ? Marital status: Widowed  ?  Spouse name: Not on file  ? Number of children: Not on file  ? Years of education: Not on file  ? Highest education level: Not on file  ?Occupational History  ? Not on file  ?Tobacco Use  ? Smoking status: Former  ?  Packs/day: 1.00  ?  Years: 40.00  ?  Pack years: 40.00  ?  Types: Cigarettes  ? Smokeless tobacco: Never  ?Vaping Use  ? Vaping Use: Never used  ?Substance and Sexual Activity  ? Alcohol use: No  ? Drug use: Not Currently  ?  Types: Marijuana  ? Sexual activity: Not Currently  ?Other Topics Concern  ? Not on file  ?Social History Narrative  ? Not on file  ? ?Social Determinants of Health  ? ?Financial Resource Strain: High Risk  ? Difficulty of Paying Living Expenses: Very hard  ?Food Insecurity: Not on file  ?Transportation Needs: Not on file  ?Physical Activity: Not on file  ?Stress: Stress Concern Present  ? Feeling of Stress : Very much  ?Social Connections: Not on file  ?Intimate Partner Violence: Not on file  ? ? ?FAMILY HISTORY: ?Family History  ?  Problem Relation Age of Onset  ? Peripheral Artery Disease Mother   ? Breast cancer Neg Hx   ? ? ?ALLERGIES:  is allergic to diazepam, keytruda [pembrolizumab], metoprolol, and tapazole [methimazole]. ? ?MEDICATIONS:  ?Current Facility-Administered Medications  ?Medication Dose Route Frequency Provider Last Rate Last Admin  ?  stroke: early stages of recovery book   Does not apply Once Mansy, Jan A, MD      ?  0.9 %  sodium chloride infusion   Intravenous Continuous Mansy, Arvella Merles, MD 75 mL/hr at 12/31/21 2234 New Bag at 12/31/21 2234  ? acetaminophen (TYLENOL) tablet 650 mg  650 mg Oral Q4H PRN Mansy, Jan A, MD      ? Or  ? acetaminophen (TYLENOL) 160 MG/5ML solution 650 mg  650 mg Per Tube Q4H PRN Mansy, Arvella Merles, MD      ? Or  ? acetaminophen (TYLENOL) suppository 650 mg  650 mg Rectal Q4H PRN Mansy, Jan A, MD      ? bismuth subsalicylate (PEPTO BISMOL) 262 MG/15ML suspension 30 mL  30 mL Oral Q6H PRN Mansy, Jan A, MD      ? dexamethasone (DECADRON) injection 4 mg  4 mg Intravenous Q6H Charlaine Dalton R, MD      ? diltiazem (CARDIZEM CD) 24 hr capsule 180 mg  180 mg Oral QAC breakfast Mansy, Jan A, MD   180 mg at 01/01/22 4098  ? enoxaparin (LOVENOX) injection 40 mg  40 mg Subcutaneous QHS Mansy, Jan A, MD   40 mg at 12/31/21 2235  ? folic acid (FOLVITE) tablet 1 mg  1 mg Oral QAC breakfast Mansy, Jan A, MD   1 mg at 01/01/22 1191  ? levothyroxine (SYNTHROID) tablet 150 mcg  150 mcg Oral Q0600 Mansy, Jan A, MD   150 mcg at 01/01/22 4782  ? meclizine (ANTIVERT) tablet 12.5 mg  12.5 mg Oral TID PRN Mansy, Jan A, MD      ? multivitamin with minerals tablet 1 tablet  1 tablet Oral QAC breakfast Mansy, Jan A, MD      ? ondansetron [facility]) injection 4 mg  4 mg Intravenous Q4H PRN Mansy, Jan A, MD      ? ondansetron [facility]) tablet 4 mg  4 mg Oral Q8H PRN Mansy, Jan A, MD      ? senna-docusate (Senokot-S) tablet 1 tablet  1 tablet Oral QHS PRN Mansy, Jan A, MD      ? traZODone (DESYREL) tablet 25 mg  25 mg Oral QHS PRN Mansy, Arvella Merles, MD      ? ?Current Outpatient Medications  ?Medication Sig Dispense Refill  ? diltiazem (CARDIZEM CD) 180 MG 24 hr capsule Take 1 capsule (180 mg total) by mouth daily. 90 capsule 3  ? folic acid (FOLVITE) 1 MG tablet Take 1 tablet (1 mg total) by mouth once daily. 90 tablet 1  ? levothyroxine (SYNTHROID) 150 MCG tablet Take 1 tablet (150 mcg total) by mouth daily before breakfast. Take on empty  stomach. 30 tablet 2  ? Multiple Vitamin (MULTI-VITAMINS) TABS Take 1 tablet by mouth daily.    ? ondansetron (ZOFRAN) 4 MG tablet Take 2 tablets (8mg  total) by mouth once every 8 hours as needed for nausea and vomiting. 80 tablet 1  ? TURMERIC PO Take 1 tablet by mouth daily.    ? bismuth subsalicylate (PEPTO BISMOL) 262 MG/15ML suspension Take 30 mLs by mouth every 6 (six) hours as needed.    ? lidocaine-prilocaine (EMLA)  cream Apply 1 application topically daily as needed. (Patient not taking: Reported on 12/31/2021) 30 g 3  ? meclizine (ANTIVERT) 25 MG tablet 1 pill at night as needed; if dizziness not improved can take one pill every 8 hours as needed/tolerated. (Patient not taking: Reported on 12/31/2021) 30 tablet 0  ? ? ?  ?. ? ?PHYSICAL EXAMINATION: ? ?Vitals:  ? 01/01/22 0900 01/01/22 0930  ?BP: 118/80 116/82  ?Pulse: (!) 104 97  ?Resp: (!) 21 16  ?Temp:    ?SpO2: 99% 98%  ? ?There were no vitals filed for this visit. ? ?Patient is resting in the bed.  Accompanied by family.  ? ?Noted to have weakness of the right upper and lower extremities. ?Physical Exam ?Vitals and nursing note reviewed.  ?HENT:  ?   Head: Normocephalic and atraumatic.  ?   Mouth/Throat:  ?   Pharynx: Oropharynx is clear.  ?Eyes:  ?   Extraocular Movements: Extraocular movements intact.  ?   Pupils: Pupils are equal, round, and reactive to light.  ?Cardiovascular:  ?   Rate and Rhythm: Normal rate and regular rhythm.  ?Pulmonary:  ?   Comments: Decreased breath sounds bilaterally.  ?Abdominal:  ?   Palpations: Abdomen is soft.  ?Musculoskeletal:     ?   General: Normal range of motion.  ?   Cervical back: Normal range of motion.  ?Skin: ?   General: Skin is warm.  ?Neurological:  ?   General: No focal deficit present.  ?   Mental Status: She is alert and oriented to person, place, and time.  ?Psychiatric:     ?   Behavior: Behavior normal.     ?   Judgment: Judgment normal.  ? ? ? ?LABORATORY DATA:  ?I have reviewed the data as  listed ?Lab Results  ?Component Value Date  ? WBC 12.3 (H) 12/31/2021  ? HGB 12.8 12/31/2021  ? HCT 39.4 12/31/2021  ? MCV 101.3 (H) 12/31/2021  ? PLT 296 12/31/2021  ? ?Recent Labs  ?  03/27/21 ?1635 03/28/21 ?

## 2022-01-01 NOTE — Progress Notes (Signed)
PIC access obtained via U/S. Patient has a R PAC, however, does not want it accessed at this time. ?

## 2022-01-01 NOTE — Progress Notes (Signed)
Admission profile updated. ?

## 2022-01-01 NOTE — Evaluation (Signed)
Occupational Therapy Evaluation ?Patient Details ?Name: Nichole Park ?MRN: [id number] ?DOB: April 08, 1961 ?Today's Date: 01/01/2022 ? ? ?History of Present Illness Nichole Park is a 61 y.o. 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  ? ?Clinical Impression ?  ?Nichole Park was seen for OT evaluation this date. Prior to hospital admission, pt was generally independent with ADL management. Pt lives with her spouse in a mobile home with 1-2 STE and R hand rail. Currently pt demonstrates impairments as described below (See OT problem list) which functionally limit her ability to perform ADL/self-care tasks. Pt currently requires CGA for functional mobility with a RW, and SUPERVISION for LB wash-up and clothing change at EOB from STS.  Pt would benefit from skilled OT services to address noted impairments and functional limitations (see below for any additional details) in order to maximize safety and independence while minimizing falls risk and caregiver burden. Upon hospital discharge, recommend HHOT to maximize pt safety and return to functional independence during meaningful occupations of daily life.  ?  ?   ? ?Recommendations for follow up therapy are one component of a multi-disciplinary discharge planning process, led by the attending physician.  Recommendations may be updated based on patient status, additional functional criteria and insurance authorization.  ? ?Follow Up Recommendations ? Home health OT  ?  ?Assistance Recommended at Discharge Set up Supervision/Assistance  ?Patient can return home with the following   ? ?  ?Functional Status Assessment ?  Patient has had a recent decline in their functional status and demonstrates the ability to make significant improvements in function in a reasonable and predictable amount of time.  ?Equipment Recommendations ? BSC/3in1;Wheelchair (measurements OT);Other (comment) (RW)  ?  ?Recommendations for Other Services   ? ? ?  ?Precautions / Restrictions Precautions ?Precautions: Fall ?Restrictions ?Weight Bearing Restrictions: No  ? ?  ? ?Mobility Bed Mobility ?Overal bed mobility: Modified Independent ?  ?  ?  ?  ?  ?  ?General bed mobility comments: HOB elevated ?  ? ?Transfers ?Overall transfer level: Needs assistance ?Equipment used: 1 person hand held assist ?Transfers: Sit to/from Stand ?Sit to Stand: Supervision, Min guard ?  ?  ?  ?  ?  ?  ?  ? ?  ?Balance Overall balance assessment: Needs assistance ?Sitting-balance support: Feet unsupported, Bilateral upper extremity supported ?Sitting balance-Leahy Scale: Good ?Sitting balance - Comments: steady static sitting, reaching within BOS. ?  ?Standing balance support: During functional activity, Single extremity supported, No upper extremity supported ?Standing balance-Leahy Scale: Good ?Standing balance comment: able to stand and maintain static standing without AD ?  ?  ?  ?  ?  ?  ?  ?  ?  ?  ?  ?   ? ?ADL either performed or assessed with clinical judgement  ? ?ADL Overall ADL's : Needs assistance/impaired ?  ?  ?  ?  ?  ?  ?  ?  ?  ?  ?  ?  ?  ?  ?  ?  ?  ?  ?  ?General ADL Comments: Pt functionally limited by generalized weakness, decreased activity tolerance and impaired functional use of her RLE/RUE. SUPERVISION for  LB wash-up and to doff brief after episode of urinary incontenence. SUPERVISION to CGA for functional/bed mobility during session.  ? ? ? ?Vision Patient Visual Report: No change from baseline ?   ?   ?Perception   ?  ?Praxis   ?  ? ?Pertinent Vitals/Pain Pain Assessment ?Pain Assessment: No/denies pain  ? ? ? ?Hand Dominance Right ?   ?Extremity/Trunk Assessment Upper Extremity Assessment ?Upper Extremity Assessment: RUE deficits/detail (Assessment limited as RN presents to room for admission to unit shortly after start of eval. Pt endorses ongoing RUE numbness with increased weakness and difficulty with AROM. R pronator drift appreciated. Grip 3/5, AROM WFL.) ?RUE Sensation: decreased light touch;decreased proprioception ?RUE Coordination: decreased fine motor;decreased gross motor ?  ?Lower Extremity Assessment ?Lower Extremity Assessment: Generalized weakness;RLE deficits/detail;Defer to PT evaluation ?RLE Sensation: decreased light touch ?RLE Coordination: decreased gross motor ?  ?Cervical / Trunk Assessment ?Cervical / Trunk Assessment: Normal ?  ?Communication Communication ?Communication: No difficulties ?  ?Cognition Arousal/Alertness: Awake/alert ?Behavior During Therapy: [facility] for tasks assessed/performed ?Overall Cognitive Status: Within Functional Limits for tasks assessed ?  ?  ?  ?  ?  ?  ?  ?  ?  ?  ?  ?  ?  ?  ?  ?  ?  ?  ?  ?General Comments    ? ?  ?Exercises Other Exercises ?Other Exercises: Pt/spouse educated on role of OT in acute setting, DC recs, and safe use of AE/DME for ADL management. Therapist facilitates LB wash-up. See ADL section for additional detail. ?  ?Shoulder Instructions    ? ? ?Home Living Family/patient expects to be discharged to:: Private residence ?Living Arrangements: Spouse/significant other ?Available Help at Discharge: Family;Available 24 hours/day ?Type of Home: Mobile home ?Home Access: Stairs to enter ?Entrance Stairs-Number of Steps: 1-2 STE from carport ?Entrance Stairs-Rails: None ?Home Layout: One level ?  ?  ?Bathroom Shower/Tub: Tub only ?  ?Bathroom Toilet: Standard ?  ?  ?  ?  ?  ?  ? ?  ?Prior Functioning/Environment Prior Level of Function : Independent/Modified Independent ?  ?  ?  ?  ?  ?  ?  ?ADLs Comments: Pt reports she is generally able to perform all ADL/IADL management without  assistance. ?  ? ?  ?  ?OT Problem List: Decreased strength;Decreased coordination;Decreased activity tolerance;Decreased safety awareness;Impaired balance (sitting and/or standing);Decreased knowledge of use of DME or AE;Pain;Impaired sensation;Decreased range of motion ?  ?   ?OT Treatment/Interventions: Self-care/ADL training;Therapeutic exercise;Therapeutic activities;DME and/or AE instruction;Patient/family education;Balance training;Energy conservation  ?  ?OT Goals(Current goals can be found in the care plan section) Acute Rehab OT Goals ?Patient Stated Goal: To get stronger. ?OT Goal Formulation: With patient ?Time For Goal Achievement: 01/15/22 ?Potential to Achieve Goals: Good ?ADL Goals ?Pt Will Perform Grooming: with modified independence;standing;with adaptive equipment ?Pt Will Perform Lower Body Dressing: sit to/from stand;with modified independence;with adaptive equipment ?Pt Will Transfer to Toilet: bedside commode;ambulating;regular height toilet;with modified independence ?Pt Will Perform Toileting - Clothing Manipulation and hygiene: sit to/from stand;with modified independence;with adaptive equipment  ?OT Frequency: Min 3X/week ?  ? ?Co-evaluation   ?  ?  ?  ?  ? ?  ?AM-PAC OT "6 Clicks" Daily Activity     ?Outcome Measure Help from another person eating meals?: A Little ?Help from another person taking care of personal grooming?: A Little ?Help from another person toileting, which includes using toliet, bedpan, or urinal?: A Little ?Help from another person  bathing (including washing, rinsing, drying)?: A Little ?Help from another person to put on and taking off regular upper body clothing?: A Little ?Help from another person to put on and taking off regular lower body clothing?: A Little ?6 Click Score: 18 ?  ?End of Session Equipment Utilized During Treatment: Gait belt;Rolling walker (2 wheels) ? ?Activity Tolerance: Patient tolerated treatment well ?Patient left: in bed;with family/visitor  present;with nursing/sitter in room ? ?OT Visit Diagnosis: Other abnormalities of gait and mobility (R26.89);Muscle weakness (generalized) (M62.81);Hemiplegia and hemiparesis;Other symptoms and signs involving the nervou

## 2022-01-01 NOTE — Assessment & Plan Note (Signed)
-   She has no current exacerbation. ?- We will place on as needed bronchodilator therapy. ?

## 2022-01-01 NOTE — Progress Notes (Addendum)
?  Progress Note ? ? ?Patient: Nichole Park BTY:[id number] DOB: Sep 17, 1960 DOA: 12/31/2021     1 ?DOS: the patient was seen and examined on 01/01/2022 ?  ?Brief hospital course: ?Nichole Park is a 61 y.o. 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 ?CT chest showed increased size of the right upper lobe lung mass.  MRI of the brain showed an increased size in the metastasis of the left parietal lobe. ?She is placed on IV steroids, oncology consult obtained, scheduling for radiation therapy ? ?Assessment and Plan: ?Metastatic lung cancer. ?Brain metastasis with right-sided hemiparesis. ?Discussed with oncology, will treat patient with IV Decadron.  Patient will get radiation therapy. ?Since arriving the hospital, patient right-sided weakness seems to be improving. ? ?Paroxysmal atrial fibrillation. ?Continue diltiazem. ? ?COPD ?Stable. ? ?Essential hypertension. ?Continue Cardizem. ? ? ? ?  ? ?Subjective:  ?Patient feels better today, she still has some headache which is much improved since yesterday.  She no longer has any vomiting. ?Right-sided weakness is also getting better. ? ?Physical Exam: ?Vitals:  ? 01/01/22 0900 01/01/22 0930 01/01/22 1200 01/01/22 1230  ?BP: 118/80 116/82 (!) 124/93 (!) 115/91  ?Pulse: (!) 104 97 93 85  ?Resp: (!) 21 16 16 18   ?Temp:      ?TempSrc:      ?SpO2: 99% 98% 97% 96%  ? ?General exam: Appears calm and comfortable  ?Respiratory system: Clear to auscultation. Respiratory effort normal. ?Cardiovascular system: S1 & S2 heard, RRR. No JVD, murmurs, rubs, gallops or clicks. No pedal edema. ?Gastrointestinal system: Abdomen is  nondistended, soft and nontender. No organomegaly or masses felt. Normal bowel sounds heard. ?Central nervous system: Alert and oriented.  Right lower arm and leg weakness ?Extremities: Symmetric 5 x 5 power. ?Skin: No rashes, lesions or ulcers ?Psychiatry: Judgement and insight appear normal. Mood & affect appropriate.  ? ?Data Reviewed: ? ?Reviewed the CT scan of the chest, MRI of the brain.  All lab results. ? ?Family Communication: Husband updated at the bedside. ? ?Disposition: ?Status is: Inpatient ?Remains inpatient appropriate because: Severity of disease, IV treatment, inpatient procedure. ? Planned Discharge Destination: Home with Home Health ? ? ? ?Time spent: 28 minutes ? ?Author: ?Sharen Hones, MD ?01/01/2022 1:38 PM ? ?For on call review [email or web address].  ?

## 2022-01-02 ENCOUNTER — Telehealth: Payer: Self-pay

## 2022-01-02 ENCOUNTER — Ambulatory Visit: Payer: Medicaid Other

## 2022-01-02 ENCOUNTER — Ambulatory Visit: Payer: Medicaid Other | Admitting: Radiation Oncology

## 2022-01-02 ENCOUNTER — Other Ambulatory Visit: Payer: Self-pay | Admitting: *Deleted

## 2022-01-02 DIAGNOSIS — C3411 Malignant neoplasm of upper lobe, right bronchus or lung: Secondary | ICD-10-CM

## 2022-01-02 NOTE — Progress Notes (Signed)
Occupational Therapy Treatment ?Patient Details ?Name: Nichole Park ?MRN: [id number] ?DOB: [birth year] ?Today's Date: 01/02/2022 ? ? ?History of present illness Nichole Park is a 61 y.o. 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 ?  ?OT comments ? Nichole Park was seen for OT treatment on this date. Upon arrival to room pt  awake/alert, denies pain, and agreeable to OT tx session. Pt endorses significant fear of falling during toileting and toilet transfers. Reviewed AE/DME options to maximize safety and functional independence with toileting upon DC home. Pt eager to practice this skill this sesison. Pt continues to be functionally limited by generalized weakness, decreased activity tolerance and impaired functional use of her RLE/RUE. She performs simulated toilet transfer and standing grooming with close supervision for safety and increased time to perform. Pt demonstrates improved balance with use of RW for short HH distances, but continues to be at risk of falling 2/2 fatigue and instability with standing tasks. She would benefit from additional education on safe use of WC and WC transfers for longer community distances and extended ADL/IADL management. Pt making good progress toward goals and continues to benefit from skilled OT services to maximize return to PLOF and minimize risk of future falls, injury, caregiver burden, and readmission. Will continue to follow POC. Discharge recommendation remains appropriate.  ? ? ?  ? ?Recommendations for follow up therapy are one component of a multi-disciplinary discharge planning process, led by the attending  physician.  Recommendations may be updated based on patient status, additional functional criteria and insurance authorization. ?   ?Follow Up Recommendations ? Home health OT  ?  ?Assistance Recommended at Discharge Set up Supervision/Assistance  ?Patient can return home with the following ?   ?  ?Equipment Recommendations ? BSC/3in1;Wheelchair (measurements OT);Other (comment)  ?  ?Recommendations for Other Services   ? ?  ?Precautions / Restrictions Precautions ?Precautions: Fall ?Restrictions ?Weight Bearing Restrictions: No  ? ? ?  ? ?Mobility Bed Mobility ?Overal bed mobility: Modified Independent ?  ?  ?  ?  ?  ?  ?General bed mobility comments: HOB elevated ?  ? ?Transfers ?Overall transfer level: Needs assistance ?Equipment used: Rolling walker (2 wheels) ?Transfers: Sit to/from Stand ?Sit to Stand: Supervision ?  ?  ?  ?  ?  ?  ?  ?  ?Balance Overall balance assessment: Needs assistance ?Sitting-balance support: Feet unsupported, Bilateral upper extremity supported ?Sitting balance-Leahy Scale: Good ?Sitting balance - Comments: steady static sitting, reaching within BOS. ?  ?Standing balance support: During functional activity, Reliant on assistive device for balance ?Standing balance-Leahy Scale: Fair ?Standing balance comment: standing balance improves with use of RW. ?  ?  ?  ?  ?  ?  ?  ?  ?  ?  ?  ?   ? ?ADL either performed or assessed with clinical judgement  ? ?ADL Overall ADL's : Needs assistance/impaired ?  ?  ?  ?  ?  ?  ?  ?  ?  ?  ?  ?  ?  ?  ?  ?  ?  ?  ?  ?General ADL Comments: Pt functionally limited by generalized weakness, decreased activity tolerance and  impaired functional use of her RLE/RUE. She performs simulated toilet transfer and standing grooming with close supervision for safety and increased time to perform. Pt demonstrates improved balance with use of RW for short HH distances, but continues to be at risk of falling 2/2 fatigue and instability with standing tasks. She would  benefit from additional education on safe use of WC and WC transfers for longer community distances and extended ADL/IADL management. ?  ? ?Extremity/Trunk Assessment   ?  ?  ?  ?  ?  ? ?Vision Patient Visual Report: No change from baseline ?  ?  ?Perception   ?  ?Praxis   ?  ? ?Cognition Arousal/Alertness: Awake/alert ?Behavior During Therapy: [facility] for tasks assessed/performed ?Overall Cognitive Status: Within Functional Limits for tasks assessed ?  ?  ?  ?  ?  ?  ?  ?  ?  ?  ?  ?  ?  ?  ?  ?  ?General Comments: Pt is A and O x 4 ?  ?  ?   ?Exercises Other Exercises ?Other Exercises: Pt/provider problem solve routines modifications to support safety, functional independence, and minimize falls risk. Pt educated on falls prevention strategies for home and hospital. Simulated toilet transfer x2 with and without AE for support. ? ?  ?Shoulder Instructions   ? ? ?  ?General Comments    ? ? ?Pertinent Vitals/ Pain       Pain Assessment ?Pain Assessment: No/denies pain ? ?Home Living   ?  ?  ?  ?  ?  ?  ?  ?  ?  ?  ?  ?  ?  ?  ?  ?  ?  ?  ? ?  ?Prior Functioning/Environment    ?  ?  ?  ?   ? ?Frequency ? Min 3X/week  ? ? ? ? ?  ?Progress Toward Goals ? ?OT Goals(current goals can now be found in the care plan section) ? Progress towards OT goals: Progressing toward goals ? ?Acute Rehab OT Goals ?Patient Stated Goal: To get stronger ?OT Goal Formulation: With patient ?Time For Goal Achievement: 01/15/22 ?Potential to Achieve Goals: Good  ?Plan Discharge plan remains appropriate;Frequency remains appropriate   ? ?Co-evaluation ? ? ?   ?  ?  ?  ?  ? ?  ?AM-PAC OT "6 Clicks" Daily Activity     ?Outcome Measure ? ? Help from another person eating meals?: A Little ?Help from another person taking care of personal grooming?: A Little ?Help from another person toileting, which includes using toliet, bedpan, or urinal?: A Little ?Help from another person bathing (including washing, rinsing, drying)?: A Little ?Help from another  person to put on and taking off regular upper body clothing?: A Little ?Help from another person to put on and taking off regular lower body clothing?: A Little ?6 Click Score: 18 ? ?  ?End of Session Equipment Utilized During Treatment: Gait belt;Rolling walker (2 wheels) ? ?OT Visit Diagnosis: Other abnormalities of gait and mobility (R26.89);Muscle weakness (generalized) (M62.81);Hemiplegia and hemiparesis;Other symptoms and signs involving the nervous system (R29.898) ?Hemiplegia - Right/Left: Right ?Hemiplegia - dominant/non-dominant: Dominant ?Hemiplegia - caused by: Other cerebrovascular disease ?  ?Activity Tolerance Patient tolerated treatment well ?  ?Patient Left in bed;with call bell/phone within reach ?  ?Nurse Communication   ?  ? ?   ? ?Time: 3762-8315 ?OT Time Calculation (min): 44 min ? ?Charges: OT General Charges ?$OT Visit: 1 Visit ?OT  Treatments ?$Self Care/Home Management : 38-52 mins ? ?Shara Blazing, M.S., OTR/L ?[facility] ?Ascom: [phone] ?01/02/22, 3:59 PM ? ?

## 2022-01-02 NOTE — Progress Notes (Addendum)
Physical Therapy Treatment ?Patient Details ?Name: Nichole Park ?MRN: [id number] ?DOB: [birth year] ?Today's Date: 01/02/2022 ? ? ?History of Present Illness Nichole Park is a 61 y.o. 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 ? ?  ?PT Comments  ? ? Pt was sitting up in bed upon arriving. She is A and O x 4 and extremely cooperative and motivated. " I'm feeling better today." Pt was easily and safely able to exit bed, stand to RW, and ambulate household distances.HR elevated to 119 bpm during gait training.  She performed ascending/descending 2 stair with use of RW. Overall pt is progressing well. Recommend HHPT to assist pt to PLOF.  ? ? ?Patient suffers from CA which impairs her ability to perform daily activities like toileting, feeding, dressing, grooming, bathing in the home. A cane, walker, crutch will not resolve the patient's issue with performing activities of daily living. A lightweight wheelchair and cushion is required/recommended and will allow patient to safely perform daily activities. ?Patient can safely propel the wheelchair in the home or has a caregiver who can provide assistance.  ?   ?Recommendations for follow up therapy are one component of a multi-disciplinary discharge planning process, led by the attending physician.  Recommendations may be updated based on patient status, additional functional criteria and insurance authorization. ? ?Follow Up Recommendations ? Home health PT ?  ?  ?Assistance Recommended at Discharge Frequent or constant Supervision/Assistance  ?Patient can return home with the following A little help with walking  and/or transfers;Assistance with cooking/housework;Assist for transportation;A little help with bathing/dressing/bathroom;Help with stairs or ramp for entrance ?  ?Equipment Recommendations ? Rolling walker (2 wheels);BSC/3in1;Wheelchair (measurements PT);Other (comment) (pt would benifit from a rollator versus RW if covered by insurance)  ?  ?   ?Precautions / Restrictions Precautions ?Precautions: Fall ?Restrictions ?Weight Bearing Restrictions: No  ?  ? ?Mobility ? Bed Mobility ?Overal bed mobility: Modified Independent ?  ? Transfers ?Overall transfer level: Needs assistance ?Equipment used: Rolling walker (2 wheels) ?Transfers: Sit to/from Stand ?Sit to Stand: Supervision ?  ?  ?Ambulation/Gait ?Ambulation/Gait assistance: Supervision ?Gait Distance (Feet): 200 Feet ?Assistive device: Rolling walker (2 wheels) ?Gait Pattern/deviations: Step-through pattern ?Gait velocity: decreased ?  ?  ?General Gait Details: pt was safely able to ambulate 200 ft with RW. no LOB or safety concern. ? ? ?Stairs ?Stairs: Yes ?Stairs assistance: Min guard ?Stair Management: No rails, Backwards, Forwards, With walker ?Number of Stairs: 2 ?General stair comments: pt was able to safely perform stairs with no rails. daughter present and also will be available to assist at home. pt was able to perform with CGA only ? ? ? ?  ?Balance Overall balance assessment: Needs assistance ?Sitting-balance support: Feet unsupported, Bilateral upper extremity supported ?Sitting balance-Leahy Scale: Good ?  ?  ?Standing balance support: During functional activity, Reliant on assistive device for balance ?Standing balance-Leahy Scale: Good ?  ?  ?   ?Cognition Arousal/Alertness: Awake/alert ?Behavior During Therapy: [facility] for tasks assessed/performed ?Overall Cognitive Status: Within Functional Limits for tasks assessed ?  ?  ?   ?General Comments: Pt is A and O x 4 ?  ?  ? ?  ?   ?   ? ?  Pertinent Vitals/Pain Pain Assessment ?Pain Assessment: No/denies  pain  ? ? ? ?PT Goals (current goals can now be found in the care plan section) Acute Rehab PT Goals ?Patient Stated Goal: go home ?Progress towards PT goals: Progressing toward goals ? ?  ?Frequency ? ? ? 7X/week ? ? ? ?  ?PT Plan Current plan remains appropriate  ? ? ?   ?AM-PAC PT "6 Clicks" Mobility   ?Outcome Measure ? Help needed turning from your back to your side while in a flat bed without using bedrails?: None ?Help needed moving from lying on your back to sitting on the side of a flat bed without using bedrails?: None ?Help needed moving to and from a bed to a chair (including a wheelchair)?: A Little ?Help needed standing up from a chair using your arms (e.g., wheelchair or bedside chair)?: A Little ?Help needed to walk in hospital room?: A Little ?Help needed climbing 3-5 steps with a railing? : A Little ?6 Click Score: 20 ? ?  ?End of Session Equipment Utilized During Treatment: Gait belt ?Activity Tolerance: Patient tolerated treatment well ?Patient left: in bed;with call bell/phone within reach;with family/visitor present ?Nurse Communication: Mobility status ?PT Visit Diagnosis: Unsteadiness on feet (R26.81);Difficulty in walking, not elsewhere classified (R26.2);Other symptoms and signs involving the nervous system (R29.898);Muscle weakness (generalized) (M62.81);History of falling (Z91.81) ?  ? ? ?Time: 1610-9604 ?PT Time Calculation (min) (ACUTE ONLY): 13 min ? ?Charges:  $Gait Training: 8-22 mins          ?          ? ?Julaine Fusi PTA ?01/02/22, 9:38 AM  ? ?

## 2022-01-02 NOTE — Progress Notes (Signed)
?  Progress Note ? ? ?Patient: Nichole Park JJO:[id number] DOB: [birth year] DOA: 12/31/2021     2 ?DOS: the patient was seen and examined on 01/02/2022 ?  ?Brief hospital course: ?Nichole Park is a 61 y.o. 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 ?CT chest showed increased size of the right upper lobe lung mass.  MRI of the brain showed an increased size in the metastasis of the left parietal lobe. ?She is placed on IV steroids, oncology consult obtained, scheduling for radiation therapy ? ?Assessment and Plan: ?Metastatic lung cancer. ?Brain metastasis with right-sided hemiparesis. ?Patient has been seen by oncology and radiation oncology, scheduled for radiation therapy. ?Condition is improving, will continue IV steroids for another day.  Will discharge home tomorrow. ? ? ?Paroxysmal atrial fibrillation. ?Continue diltiazem. ? ?COPD ?Stable. ? ?Essential hypertension. ?Continue Cardizem. ? ? ?  ? ?Subjective:  ?Patient feels much better today, no nausea vomiting or headache today. ?Right-sided weakness much better. ? ?Physical Exam: ?Vitals:  ? 01/01/22 2015 01/02/22 0005 01/02/22 0523 01/02/22 1221  ?BP: 125/77 134/75 126/83 129/79  ?Pulse: 89 93 84 84  ?Resp: 20 20 18 16   ?Temp: 97.9 ?F (36.6 ?C) 97.6 ?F (36.4 ?C) 97.8 ?F (36.6 ?C) 98.5 ?F (36.9 ?C)  ?TempSrc: Oral Oral  Oral  ?SpO2: 98% 99% 98% 99%  ?Weight: 65 kg     ?Height: 5' 1.5" (1.562 m)     ? ?General exam: Appears calm and comfortable  ?Respiratory system: Clear to auscultation. Respiratory effort normal. ?Cardiovascular system: S1 & S2 heard, RRR. No JVD, murmurs, rubs, gallops or clicks. No  pedal edema. ?Gastrointestinal system: Abdomen is nondistended, soft and nontender. No organomegaly or masses felt. Normal bowel sounds heard. ?Central nervous system: Alert and oriented. No focal neurological deficits. ?Extremities: Symmetric 5 x 5 power. ?Skin: No rashes, lesions or ulcers ?Psychiatry: Judgement and insight appear normal. Mood & affect appropriate.  ? ?Data Reviewed: ? ?There are no new results to review at this time. ? ?Family Communication:  ? ?Disposition: ?Status is: Inpatient ?Remains inpatient appropriate because: Severity of disease, IV steroids ? Planned Discharge Destination: Home ? ? ? ?Time spent: 26 minutes ? ?Author: ?Sharen Hones, MD ?01/02/2022 12:49 PM ? ?For on call review [email or web address].  ?

## 2022-01-02 NOTE — Telephone Encounter (Signed)
Per secure chat from Dr. B: Please send foundation 1 on on SURGICAL PATHOLOGY  ?CASE: [phone]  ?PATIENT: Nichole Park  ?PDL-1 Beryle Flock ? ? ?Order submitted and faxed with demo/ins/path report. ? ? ?

## 2022-01-02 NOTE — Progress Notes (Signed)
Nichole Park   DOB:[birth year]   HK#:[id number]   ? ?Subjective: Patient sitting in the bed having her lunch.  Noted to have significant improvement of her right upper lower extremity weakness.  Patient has been able to walk with physical therapy in the hallway.  However she is concerned about falls at home.  Otherwise no nausea no vomiting. ? ?Objective:  ?Vitals:  ? 01/02/22 0523 01/02/22 1221  ?BP: 126/83 129/79  ?Pulse: 84 84  ?Resp: 18 16  ?Temp: 97.8 ?F (36.6 ?C) 98.5 ?F (36.9 ?C)  ?SpO2: 98% 99%  ?  ? ?Intake/Output Summary (Last 24 hours) at 01/02/2022 1313 ?Last data filed at 01/02/2022 1028 ?Gross per 24 hour  ?Intake 717 ml  ?Output --  ?Net 717 ml  ? ? ?Physical Exam ?Vitals and nursing note reviewed.  ?HENT:  ?   Head: Normocephalic and atraumatic.  ?   Mouth/Throat:  ?   Pharynx: Oropharynx is clear.  ?Eyes:  ?   Extraocular Movements: Extraocular movements intact.  ?   Pupils: Pupils are equal, round, and reactive to light.  ?Cardiovascular:  ?   Rate and Rhythm: Normal rate and regular rhythm.  ?Pulmonary:  ?   Comments: Decreased breath sounds bilaterally.  ?Abdominal:  ?   Palpations: Abdomen is soft.  ?Musculoskeletal:     ?   General: Normal range of motion.  ?   Cervical back: Normal range of motion.  ?Skin: ?   General: Skin is warm.  ?Neurological:  ?   General: No focal deficit present.  ?   Mental Status: She is alert and oriented to person, place, and time.  ?Psychiatric:     ?   Behavior: Behavior normal.     ?   Judgment: Judgment normal.  ? ?  ?Labs:  ?Lab Results  ?Component Value Date  ? WBC 12.3 (H) 12/31/2021  ? HGB 12.8 12/31/2021  ? HCT 39.4 12/31/2021  ? MCV 101.3 (H) 12/31/2021  ? PLT 296 12/31/2021  ? NEUTROABS 9.6 (H) 12/31/2021  ? ? ?Lab Results  ?Component Value Date  ? NA 138 12/31/2021  ? K 4.0 12/31/2021  ? CL 101 12/31/2021  ? CO2 28 12/31/2021  ? ? ?Studies:  ?DG Chest 2 View ? ?Result Date: 12/31/2021 ?CLINICAL DATA:  Suspected Sepsis EXAM: CHEST - 2 VIEW COMPARISON:  Chest  x-ray 05/02/2021. FINDINGS: Redemonstrated right lung mass, better characterized on prior CT chest from 10/01/2021. Multiple subtle nodular areas of consolidation in both lungs. No visible pleural effusions or pneumothorax. Cardiomediastinal silhouette is similar. Right IJ approach central venous catheter with the tip projecting at the superior cavoatrial junction, similar. No displaced fracture. IMPRESSION: 1. Redemonstrated right lung mass, better characterized on prior CT chest from 10/01/2021. 2. Multiple subtle nodular areas of consolidation in both lungs, which could represent pneumonia, metastases, or artifact. Chest CT could further characterize if clinically warranted. Electronically Signed   By: Margaretha Sheffield M.D.   On: 12/31/2021 17:31  ? ?CT Head Wo Contrast ? ?Result Date: 12/31/2021 ?CLINICAL DATA:  Altered mental status weakness EXAM: CT HEAD WITHOUT CONTRAST TECHNIQUE: Contiguous axial images were obtained from the base of the skull through the vertex without intravenous contrast. RADIATION DOSE REDUCTION: This exam was performed according to the departmental dose-optimization program which includes automated exposure control, adjustment of the mA and/or kV according to patient size and/or use of iterative reconstruction technique. COMPARISON:  MRI 10/24/2021 FINDINGS: Brain: No hemorrhage or large vessel territorial infarct. Worsened  vasogenic edema within the left frontal and parietal white matter, suspicious for disease progression. Minimal 2 mm midline shift to the right. The ventricles are nonenlarged. Vascular: No hyperdense vessel or unexpected calcification. Carotid vascular calcification Skull: Normal. Negative for fracture or focal lesion. Sinuses/Orbits: No acute finding. Other: None IMPRESSION: 1. Worsened hypodensity/edema within the left frontal and parietal white matter with minimal 2 mm midline shift to the right, concerning for disease progression. Follow-up MRI with and without  contrast is recommended. Electronically Signed   By: Donavan Foil M.D.   On: 12/31/2021 17:31  ? ?CT Chest Wo Contrast ? ?Result Date: 12/31/2021 ?CLINICAL DATA:  Abnormal chest x-ray EXAM: CT CHEST WITHOUT CONTRAST TECHNIQUE: Multidetector CT imaging of the chest was performed following the standard protocol without IV contrast. RADIATION DOSE REDUCTION: This exam was performed according to the departmental dose-optimization program which includes automated exposure control, adjustment of the mA and/or kV according to patient size and/or use of iterative reconstruction technique. COMPARISON:  Chest x-ray 12/31/2021, CT 10/01/2021 FINDINGS: Cardiovascular: Limited evaluation without intravenous contrast. Aorta is nonaneurysmal. Moderate aortic atherosclerosis. Normal cardiac size. No pericardial effusion. Right-sided central venous catheter tip at the cavoatrial junction. Mediastinum/Nodes: Midline trachea. No thyroid mass. Subcentimeter mediastinal lymph nodes. Previously measured right paratracheal lymph node mildly increased to 7 mm from 4 mm previously. Low right paratracheal lymph node esophagus normal. Multiple enlarged left axillary lymph nodes measuring up to 2 cm, increased compared to prior. Lungs/Pleura: Emphysema. Spiculated right upper lobe lung mass increased in size, this measures 4.5 x 3.3 by 4.2 cm, prior measurements of 3.5 x 2.4 x 2.9 cm. Interim development of multiple pulmonary nodules consistent with metastatic disease, for example right lower lobe pulmonary nodule measuring 8 mm, series 4, image 65. Irregular left lower lobe pulmonary nodule measuring 10 mm, series 4, image number 110. Pulmonary nodules are felt to correspond to the radiographic abnormality seen today. Upper Abdomen: No acute abnormality. Musculoskeletal: No chest wall mass or suspicious bone lesions identified. IMPRESSION: 1. Interval increase in size of large spiculated right upper lobe lung mass since prior CT concerning  for disease progression. Interim development of multiple bilateral lung nodules, also concerning for progression of disease. Increased left axillary adenopathy also concerning for progression of disease. 2. Emphysema Aortic Atherosclerosis (ICD10-I70.0) and Emphysema (ICD10-J43.9). Electronically Signed   By: Donavan Foil M.D.   On: 12/31/2021 18:51  ? ?MR BRAIN W WO CONTRAST ? ?Result Date: 12/31/2021 ?CLINICAL DATA:  Right foot tingling and numb EXAM: MRI HEAD WITHOUT AND WITH CONTRAST TECHNIQUE: Multiplanar, multiecho pulse sequences of the brain and surrounding structures were obtained without and with intravenous contrast. CONTRAST:  35mL GADAVIST GADOBUTROL 1 MMOL/ML IV SOLN COMPARISON:  10/24/2021 MRI, correlation is also made with 12/31/2021 CT head FINDINGS: Brain: Restricted diffusion in the left parietal lobe, which appears associated with the known mass. No other restricted diffusion to suggest acute or subacute infarct. Interval increase in the size of the peripherally enhancing mass in the left parietal lobe, which now measures up to 1.7 x 1.7 x 2.0 cm (series 18, image 110 and series 19, image 10), previously 0.7 x 0.6 x 0.7 cm when remeasured similarly. Significantly increased surrounding edema, which extends into the posterior left frontal lobe and superior left temporal and occipital lobes, which causes mass effect on the left lateral ventricle and 5 mm of left-to-right midline shift. The previously noted punctate left frontal lobe enhancing lesion is no longer seen. No new lesions. No acute  hemorrhage, hydrocephalus, or extra-axial collection. Vascular: Normal flow voids. Skull and upper cervical spine: Normal marrow signal. No abnormal enhancement. Sinuses/Orbits: Mucosal thickening in the maxillary sinuses and ethmoid air cells. The orbits are unremarkable. Other: Fluid in bilateral mastoid air cells. IMPRESSION: 1. Interval increase in the size of the previously noted left parietal metastatic  lesion, with significantly increased surrounding edema, which results in mass effect on the left lateral ventricle and 5 mm of left-to-right midline shift. 2. Previously noted left frontal lesion is no long

## 2022-01-02 NOTE — Consult Note (Signed)
Radiation Oncology ?Follow up Note old patient new area brain mets ? ?Name: Nichole Park   ?Date:   12/31/2021 ?MRN:  [id number] ?DOB: September 24, 1960  ? ? ?This 61 y.o. female presents to the hospital for solitary brain met and patient previously treated for 3 brain metastasis with SRS patient with known stage IV adenocarcinoma of the lung ?REFERRING PROVIDER: No ref. provider found ? ?HPI: Patient is a 61 year old female well-known to the department now out 5 months having completed SRS for 3 metastatic brain lesions in a patient with known stage IV adenocarcinoma of the lung.Marland Kitchen  She was recently admitted for new onset of right upper and lower extremity weakness.  MRI scan of the brain demonstrated 2 of the 3 lesions completely responded although the left parietal metastatic lesion had significant increased with surrounding edema and mass effect on the left lateral ventricle.  This is in the region of her previous brain metastasis.  She has been started on Decadron her symptoms have improved greatly and she is seen in the hospital room today she is in no pain or distress. ? ?COMPLICATIONS OF TREATMENT: none ? ?FOLLOW UP COMPLIANCE: keeps appointments  ? ?PHYSICAL EXAM:  ?BP 126/83 (BP Location: Left Arm)   Pulse 84   Temp 97.8 ?F (36.6 ?C)   Resp 18   Ht 5' 1.5" (1.562 m)   Wt 143 lb 4.8 oz (65 kg)   SpO2 98%   BMI 26.64 kg/m?  ?Motor or sensory and DTR levels appear equal and symmetric in upper lower extremities.  Crude visual fields within normal range proprioception is intact.  Well-developed well-nourished patient in NAD. HEENT reveals PERLA, EOMI, discs not visualized.  Oral cavity is clear. No oral mucosal lesions are identified. Neck is clear without evidence of cervical or supraclavicular adenopathy. Lungs are clear to A&P. Cardiac examination is essentially unremarkable with regular rate and rhythm without murmur rub or thrill. Abdomen is benign with no organomegaly or masses noted. Motor sensory and DTR  levels are equal and symmetric in the upper and lower extremities. Cranial nerves II through XII are grossly intact. Proprioception is intact. No peripheral adenopathy or edema is identified. No motor or sensory levels are noted. Crude visual fields are within normal range. ? ?RADIOLOGY RESULTS: MRI scan reviewed thin cut MRI scan will be ordered ? ?PLAN: This time elected ahead with SRS again we will plan on delivering 18 Gray in a single fraction to her progressive ring-enhancing lesion.  Risks and benefits of treatment including fatigue possible brain necrosis possible hair loss all were discussed in detail with the patient and her family.  At this time she will be discharged soon we will get a thin cut MRI scan for treatment planning purposes.  We will then will set up simulation.  Patient family comprehend the recommendations well. ? ?I would like to take this opportunity to thank you for allowing me to participate in the care of your patient.. ?  ? Noreene Filbert, MD ? ?

## 2022-01-02 NOTE — Consult Note (Addendum)
? ?Referring Physician:  ?No referring provider defined for this encounter. ? ?Primary Physician:  ?[place] ? ?Chief Complaint:  weakness, intracranial lesion ? ?History of Present Illness: ?01/02/2022 ?Nichole Park is a 61 y.o. female who presents with the chief complaint of weakness.  She was worked up and found to have an enlarging L intracranial lesion that was previously treated with radiation treatment. ? ?She reports RUE and RLE weakness as well as some confusion, all of which has improved with steroid treatment.  She additionally had headaches, which has also improved. ? ?Review of Systems:  ?A 10 point review of systems is negative, except for the pertinent positives and negatives detailed in the HPI. ? ?Past Medical History: ?Past Medical History:  ?Diagnosis Date  ? Graves disease   ? Malignant pericardial effusion   ? Paroxysmal atrial fibrillation (HCC)   ? Primary lung adenocarcinoma ([place])   ? ? ?Past Surgical History: ?Past Surgical History:  ?Procedure Laterality Date  ? IR CV LINE INJECTION  06/19/2021  ? IR IMAGING GUIDED PORT INSERTION  04/27/2021  ? PERICARDIOCENTESIS N/A 03/27/2021  ? Procedure: PERICARDIOCENTESIS;  Surgeon: Nelva Bush, MD;  Location: [facility];  Service: Cardiovascular;  Laterality: N/A;  ? TUBAL LIGATION    ? ? ?Allergies: ?Allergies as of 12/31/2021 - Review Complete 12/31/2021  ?Allergen Reaction Noted  ? Diazepam Other (See Comments) 12/07/2013  ? Keytruda [pembrolizumab] Rash 12/28/2021  ? Metoprolol  03/19/2017  ? Tapazole [methimazole]  03/19/2017  ? ? ?Medications: ? ?Current Facility-Administered Medications:  ?   stroke: early stages of recovery book, , Does not apply, Once, Mansy, Jan A, MD ?  acetaminophen (TYLENOL) tablet 650 mg, 650 mg, Oral, Q4H PRN **OR** acetaminophen (TYLENOL) 160 MG/5ML solution 650 mg, 650 mg, Per Tube, Q4H PRN **OR** acetaminophen (TYLENOL) suppository 650 mg, 650 mg, Rectal, Q4H PRN, Mansy, Jan A, MD ?   bismuth subsalicylate (PEPTO BISMOL) 262 MG/15ML suspension 30 mL, 30 mL, Oral, Q6H PRN, Mansy, Jan A, MD ?  dexamethasone (DECADRON) injection 4 mg, 4 mg, Intravenous, Q6H, Brahmanday, Govinda R, MD, 4 mg at 01/02/22 0607 ?  diltiazem (CARDIZEM CD) 24 hr capsule 180 mg, 180 mg, Oral, QAC breakfast, Mansy, Jan A, MD, 180 mg at 01/01/22 0737 ?  enoxaparin (LOVENOX) injection 40 mg, 40 mg, Subcutaneous, QHS, Mansy, Jan A, MD, 40 mg at 01/01/22 2102 ?  feeding supplement (BOOST / RESOURCE BREEZE) liquid 1 Container, 1 Container, Oral, TID BM, Sharen Hones, MD, 1 Container at 01/01/22 2102 ?  folic acid (FOLVITE) tablet 1 mg, 1 mg, Oral, QAC breakfast, Mansy, Jan A, MD, 1 mg at 01/01/22 1062 ?  levothyroxine (SYNTHROID) tablet 150 mcg, 150 mcg, Oral, Q0600, Mansy, Jan A, MD, 150 mcg at 01/02/22 6948 ?  meclizine (ANTIVERT) tablet 12.5 mg, 12.5 mg, Oral, TID PRN, Mansy, Jan A, MD ?  multivitamin with minerals tablet 1 tablet, 1 tablet, Oral, QAC breakfast, Mansy, Jan A, MD ?  ondansetron [facility]) injection 4 mg, 4 mg, Intravenous, Q4H PRN, Mansy, Jan A, MD ?  ondansetron [facility]) tablet 4 mg, 4 mg, Oral, Q8H PRN, Mansy, Jan A, MD ?  senna-docusate (Senokot-S) tablet 1 tablet, 1 tablet, Oral, QHS PRN, Mansy, Jan A, MD ?  traZODone (DESYREL) tablet 25 mg, 25 mg, Oral, QHS PRN, Mansy, Arvella Merles, MD ? ? ?Social History: ?Social History  ? ?Tobacco Use  ? Smoking status: Former  ?  Packs/day: 1.00  ?  Years: 40.00  ?  Pack years: 40.00  ?  Types: Cigarettes  ? Smokeless tobacco: Never  ?Vaping Use  ? Vaping Use: Never used  ?Substance Use Topics  ? Alcohol use: No  ? Drug use: Not Currently  ?  Types: Marijuana  ? ? ?Family Medical History: ?Family History  ?Problem Relation Age of Onset  ? Peripheral Artery Disease Mother   ? Breast cancer Neg Hx   ? ? ?Physical Examination: ?Vitals:  ? 01/02/22 0005 01/02/22 0523  ?BP: 134/75 126/83  ?Pulse: 93 84  ?Resp: 20 18  ?Temp: 97.6 ?F (36.4 ?C) 97.8 ?F (36.6 ?C)  ?SpO2: 99% 98%   ? ? ? ?General: Patient is well developed, well nourished, calm, collected, and in no apparent distress. ? ?Psychiatric: Patient is non-anxious. ? ?Head:  Pupils equal, round, and reactive to light. ? ?ENT:  Oral mucosa appears well hydrated. ? ?Neck:   Supple.  Full range of motion. ? ?Respiratory: Patient is breathing without any difficulty. ? ?Extremities: No edema. ? ?Vascular: Palpable pulses in dorsal pedal vessels. ? ?Skin:   On exposed skin, there are no abnormal skin lesions. ? ?NEUROLOGICAL:  ?General: In no acute distress.   ?Awake, alert, oriented to person, place, and time.  Pupils equal round and reactive to light.  Facial tone is symmetric.  Tongue protrusion is midline.  There is no pronator drift. ? ? ?Strength: ?Side Biceps Triceps Deltoid Interossei Grip Wrist Ext. Wrist Flex.  ?R 4+ 5 5 5 5 5 5   ?L 5 5 5 5 5 5 5   ? ?Side Iliopsoas Quads Hamstring PF DF EHL  ?R 5 5 5 5 5 5   ?L 5 5 5 5 5 5   ? ? ?Bilateral upper and lower extremity sensation is intact to light touch. ?Reflexes are 1+ and symmetric at the biceps, triceps, brachioradialis, patella and achilles. Hoffman's is absent. ? ?Clonus is not present.  Toes are down-going.   ? ?Gait is untested. Using walker with PT.  ? ?Imaging: ?MRI Brain 12/31/2021 ?IMPRESSION: ?1. Interval increase in the size of the previously noted left ?parietal metastatic lesion, with significantly increased surrounding ?edema, which results in mass effect on the left lateral ventricle ?and 5 mm of left-to-right midline shift. ?2. Previously noted left frontal lesion is no longer seen. ?  ?  ?Electronically Signed ?  By: Merilyn Baba M.D. ?  On: 12/31/2021 22:56 ? ?I have personally reviewed the images and agree with the above interpretation. ? ?The above lesion causes significant brain compression and led to alteration of the patient's sensorium during her initial presentation. ? ?Labs: ? ?  Latest Ref Rng & Units 12/31/2021  ?  4:39 PM 12/28/2021  ?  9:09 AM 12/07/2021   ?  8:33 AM  ?CBC  ?WBC 4.0 - 10.5 K/uL 12.3   11.1   10.6    ?Hemoglobin 12.0 - 15.0 g/dL 12.8   11.8   12.6    ?Hematocrit 36.0 - 46.0 % 39.4   36.6   38.1    ?Platelets 150 - 400 K/uL 296   314   348    ? ? ? ? ? ?Assessment and Plan: ?Ms. Fiorini is a pleasant 61 y.o. female with metastatic lung cancer with increased size of a L parietal lesion.  Per her oncology note, she has progressive disease despite treatment. ? ?- Given progressive systemic disease, would favor radiation treatment. The lesion is accessible if control of progressive disease is achieved. ?- Further care per  her oncologist and primary team. ? ? ? ?Jonluke Cobbins K. Izora Ribas MD, [place] ?Dept. of Neurosurgery ?  ? ?

## 2022-01-03 ENCOUNTER — Inpatient Hospital Stay: Payer: Medicaid Other

## 2022-01-03 ENCOUNTER — Encounter: Payer: Self-pay | Admitting: Internal Medicine

## 2022-01-03 ENCOUNTER — Telehealth: Payer: Self-pay | Admitting: *Deleted

## 2022-01-03 ENCOUNTER — Inpatient Hospital Stay: Payer: Medicaid Other | Admitting: Hospice and Palliative Medicine

## 2022-01-03 DIAGNOSIS — J432 Centrilobular emphysema: Secondary | ICD-10-CM

## 2022-01-03 MED ORDER — LEVOTHYROXINE SODIUM 150 MCG PO TABS: 150.0000 ug | ORAL_TABLET | Freq: Every day | ORAL | 0 refills | Status: DC

## 2022-01-03 MED ORDER — DEXAMETHASONE 4 MG PO TABS: 4.0000 mg | ORAL_TABLET | Freq: Three times a day (TID) | ORAL | 0 refills | Status: DC

## 2022-01-03 MED ORDER — HEPARIN SOD (PORK) LOCK FLUSH 100 UNIT/ML IV SOLN
500.0000 [IU] | Freq: Once | INTRAVENOUS | Status: AC
  Administered 2022-01-03: 500 [IU] via INTRAVENOUS
  Filled 2022-01-03: qty 5

## 2022-01-03 NOTE — Clinical Social Work Note (Addendum)
Occupational Therapy * Physical Therapy * Speech Therapy ?       ? ? ?DATE ___04/27/2023________________ ?PATIENT NAME___[name]__________________ ?PATIENT MRN_____[id number]_______________ ? ?DIAGNOSIS/DIAGNOSIS CODE ___Metastatic lung cancer C34.90___________________ ?DATE OF DISCHARGE: ___04/27/2023___________ ? ?PRIMARY CARE PHYSICIAN _[facility]_________________________ ?PCP PHONE/FAX______[phone]_____________________ ? ?  ? ?Dear Provider (Name: __________________   ?Fax: ___________________________): ?  ?I certify that I have examined this patient and that occupational/physical/speech therapy is necessary on an outpatient basis.   ? ?The patient has expressed interest in completing their recommended course of therapy at your location.  Once a formal order from the patient's primary care physician has been obtained, please contact him/her to schedule an appointment for evaluation at your earliest convenience. ? ? ?[  X]  Physical Therapy Evaluate and Treat ? ?[ X ]  Occupational Therapy Evaluate and Treat ? ?   [  ]  Speech Therapy Evaluate and Treat ? ? ? ? ? ? ?The patient's primary care physician (listed above) must furnish and be responsible for a formal order such that the recommended services may be furnished while under the primary physician's care, and that the plan of care will be established and reviewed every 30 days (or more often if condition necessitates).  ? ?MD electronic signature noted below ?

## 2022-01-03 NOTE — Addendum Note (Signed)
Addended by: Vanice Sarah on: 01/03/2022 02:00 PM ? ? Modules accepted: Orders ? ?

## 2022-01-03 NOTE — Telephone Encounter (Signed)
Returned call to nurse Kennyth Lose on 1 C at [facility].  Pt is being discharged today and will not be in for appointments scheduled for today. ? ?RN also wanted to set up radiation consult appointment.  Stated would route message to  ?Rad Onc.  Return call to  [phone].   ?

## 2022-01-03 NOTE — Progress Notes (Addendum)
Physical Therapy Treatment ?Patient Details ?Name: Nichole Park ?MRN: [id number] ?DOB: 03/02/1961 ?Today's Date: 01/03/2022 ? ? ?History of Present Illness Nichole Park is a 61 y.o. 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 ? ?  ?PT Comments  ? ? Pt was sitting up in bed, [name] style, upon arriving. She is A and O x 4 and reporting she is [place] home today. She was safely able to exit bed, stand, and ambulate with use of RW. She needs RW and BSC + W/C prior to DC. Pt has radiation in near future and will require W/C for longer community distances. Overall tolerated session well. Cleared for safe DC home from a PT standpoint.  ? ? ?Patient suffers from cancer which impairs his/her ability to perform daily activities like toileting, feeding, dressing, grooming, bathing in the home. A cane, walker, crutch will not resolve the patient's issue with performing activities of daily living. A lightweight wheelchair and cushion is required/recommended and will allow patient to safely perform daily activities. ?  ?Patient can safely propel the wheelchair in the home or has a caregiver who can provide assistance.   ?  ?Recommendations for follow up therapy are one component of a multi-disciplinary discharge planning process, led by the attending physician.  Recommendations may be updated based on patient status, additional functional criteria and insurance authorization. ? ?Follow Up Recommendations ? Home health PT ?  ?  ?Assistance Recommended at Discharge Frequent or constant Supervision/Assistance  ?Patient can return home with the following A little help with  walking and/or transfers;Assistance with cooking/housework;Assist for transportation;A little help with bathing/dressing/bathroom;Help with stairs or ramp for entrance ?  ?Equipment Recommendations ? Rolling walker (2 wheels);BSC/3in1;Wheelchair (measurements PT);Other (comment)  ?  ?   ?Precautions / Restrictions Precautions ?Precautions: Fall ?Restrictions ?Weight Bearing Restrictions: No  ?  ? ?Mobility ? Bed Mobility ?Overal bed mobility: Modified Independent ?  ?Transfers ?Overall transfer level: Needs assistance ?Equipment used: Rolling walker (2 wheels) ?Transfers: Sit to/from Stand ?Sit to Stand: Supervision ?  ? Ambulation/Gait ?Ambulation/Gait assistance: Supervision ?Gait Distance (Feet): 200 Feet ?Assistive device: Rolling walker (2 wheels) ?Gait Pattern/deviations: Step-through pattern ?Gait velocity: decreased ?  ?  ?General Gait Details: pt was safely able to ambulate 200 ft with RW. no LOB or safety concern. ? ? ?  ?Balance Overall balance assessment: Needs assistance ?Sitting-balance support: Feet unsupported, Bilateral upper extremity supported ?Sitting balance-Leahy Scale: Good ?  ?  ?Standing balance support: During functional activity, Reliant on assistive device for balance ?Standing balance-Leahy Scale: Fair ?  ?  ?  ?Cognition Arousal/Alertness: Awake/alert ?Behavior During Therapy: [facility] for tasks assessed/performed ?Overall Cognitive Status: Within Functional Limits for tasks assessed ?  ?   ?General Comments: Pt is A and O x 4 ?  ?  ? ?  ?   ?   ? ?Pertinent Vitals/Pain Pain Assessment ?Pain Assessment: No/denies pain  ? ? ? ?PT Goals (current goals can now be found in the care plan section) Acute Rehab PT Goals ?Patient Stated Goal: go home ?Progress towards PT goals: Progressing toward goals ? ?  ?Frequency ? ? ? 7X/week ? ? ? ?  ?PT Plan Current plan remains appropriate  ? ? ?   ?  AM-PAC PT "6 Clicks" Mobility   ?Outcome Measure ? Help needed turning from your back to your side while in a  flat bed without using bedrails?: None ?Help needed moving from lying on your back to sitting on the side of a flat bed without using bedrails?: None ?Help needed moving to and from a bed to a chair (including a wheelchair)?: A Little ?Help needed standing up from a chair using your arms (e.g., wheelchair or bedside chair)?: A Little ?Help needed to walk in hospital room?: A Little ?Help needed climbing 3-5 steps with a railing? : A Little ?6 Click Score: 20 ? ?  ?End of Session   ?Activity Tolerance: Patient tolerated treatment well ?Patient left: in bed;with call bell/phone within reach;with family/visitor present ?Nurse Communication: Mobility status ?PT Visit Diagnosis: Unsteadiness on feet (R26.81);Difficulty in walking, not elsewhere classified (R26.2);Other symptoms and signs involving the nervous system (R29.898);Muscle weakness (generalized) (M62.81);History of falling (Z91.81) ?  ? ? ?Time: 0811-0820 ?PT Time Calculation (min) (ACUTE ONLY): 9 min ? ?Charges:  $Gait Training: 8-22 mins          ?          ?Julaine Fusi PTA ?01/03/22, 8:42 AM  ? ?

## 2022-01-03 NOTE — Telephone Encounter (Signed)
Please cancel the [place] appt for tomorrow and add lab encounter with NP appt next week. ?

## 2022-01-03 NOTE — TOC Progression Note (Signed)
Transition of Care (TOC) - Progression Note  ? ? ?Patient Details  ?Name: JANAVIA ROTTMAN ?MRN: [id number] ?Date of Birth: [birth year] ? ?Transition of Care (TOC) CM/SW Contact  ?Reeda Soohoo A Canary Fister, LCSW ?Phone Number: ?01/03/2022, 2:26 PM ? ?Clinical Narrative:   [employer] confirmed wheelchair would be delivered to the room. RN aware. ? ? ? ?  ?  ? ?Expected Discharge Plan and Services ?  ?  ?  ?  ?  ?Expected Discharge Date: 01/03/22               ?  ?  ?  ?  ?  ?  ?  ?  ?  ?  ? ? ?Social Determinants of Health (SDOH) Interventions ?  ? ?Readmission Risk Interventions ?   ? View : No data to display.  ?  ?  ?  ? ? ?

## 2022-01-03 NOTE — Clinical Social Work Note (Signed)
?  ?  Durable Medical Equipment  ?(From admission, onward)  ?  ? ? ?  ? ?  Start     Ordered  ? 01/03/22 0903  For home use only DME Wheelchair electric  Once       ? 01/03/22 0903  ? 01/03/22 0902  For home use only DME Walker rolling  Once       ?Question Answer Comment  ?Walker: With 5 Inch Wheels   ?Patient needs a walker to treat with the following condition Metastatic lung cancer (metastasis from lung to other site) [facility])   ?  ? 01/03/22 0903  ? 01/03/22 0902  For home use only DME Bedside commode  Once       ?Question:  Patient needs a bedside commode to treat with the following condition  Answer:  Metastatic lung cancer (metastasis from lung to other site) [facility])  ? 01/03/22 0903  ? ?  ?  ? ?  ?  ?

## 2022-01-03 NOTE — TOC Progression Note (Signed)
Transition of Care (TOC) - Progression Note  ? ? ?Patient Details  ?Name: Nichole Park ?MRN: [id number] ?Date of Birth: 05/30/1961 ? ?Transition of Care (TOC) CM/SW Contact  ?Jory Welke A Raidyn Breiner, LCSW ?Phone Number: ?01/03/2022, 9:54 AM ? ?Clinical Narrative:   unable to obtain [facility] due to insurance. Pt notified and agreeable to outpatient therapy here at hospital. CSW sent outpatient referral to [facility] per pt's permission. DME ordered through [employer]--they will call pt to discuss what is and what is not covered.   ? ? ? ?  ?  ? ?Expected Discharge Plan and Services ?  ?  ?  ?  ?  ?Expected Discharge Date: 01/03/22               ?  ?  ?  ?  ?  ?  ?  ?  ?  ?  ? ? ?Social Determinants of Health (SDOH) Interventions ?  ? ?Readmission Risk Interventions ?   ? View : No data to display.  ?  ?  ?  ? ? ?

## 2022-01-03 NOTE — Discharge Summary (Signed)
?Physician Discharge Summary ?  ?Patient: Nichole Park MRN: [id number] DOB: 1961/03/10  ?Admit date:     12/31/2021  ?Discharge date: 01/03/22  ?Discharge Physician: Sharen Hones  ? ?PCP: [place]  ? ?Recommendations at discharge:  ? ?Follow-up with PCP in 1 week. ?Follow-up with oncology, will be scheduled by Dr. Burlene Arnt ? ?Discharge Diagnoses: ?Principal Problem: ?  Metastatic lung cancer (metastasis from lung to other site) [facility]) ?Active Problems: ?  Hypertension ?  COPD (chronic obstructive pulmonary disease) ([place]) ?  Hypothyroidism ?  Paroxysmal atrial fibrillation (H[facility]) ?  Lung cancer metastatic to brain [facility]) ? ?Resolved Problems: ?  * No resolved hospital problems. * ? ?Hospital Course: ?No notes on file ? ?Assessment and Plan: ?Metastatic lung cancer. ?Brain metastasis with right-sided hemiparesis. ?Patient has been seen by oncology and radiation oncology, scheduled for radiation therapy. ?Patient is symptomatically improved after giving IV steroids.  Discussed with Dr. Rogue Bussing, patient is medically stable to be discharged.  We will continue dexamethasone 4 mg every 8 hours until seen by him. ?  ?  ?Paroxysmal atrial fibrillation. ?Continue diltiazem. ? ?COPD ?Stable. ? ?Essential hypertension. ?Continue Cardizem. ?  ? ? ?  ? ? ?Consultants: Oncology, neurosurgery. ?Procedures performed: none  ?Disposition: Home health ?Diet recommendation:  ?Discharge Diet Orders (From admission, onward)  ? ?  Start     Ordered  ? 01/03/22 0000  Diet - low sodium heart healthy       ? 01/03/22 6720  ? ?  ?  ? ?  ? ?Cardiac diet ?DISCHARGE MEDICATION: ?Allergies as of 01/03/2022   ? ?   Reactions  ? Diazepam Other (See Comments)  ? Very high emotionally and very low emotionally, hyperventilate and passes out ?Very high emotionally and very low emotionally, hyperventilate and passes out  ? Keytruda [pembrolizumab] Rash  ? Metoprolol   ? Tapazole [methimazole]   ? ?  ? ?  ?Medication List  ?  ? ?STOP  taking these medications   ? ?lidocaine-prilocaine cream ?Commonly known as: EMLA ?  ? ?  ? ?TAKE these medications   ? ?bismuth subsalicylate 947 SJ/62EZ suspension ?Commonly known as: PEPTO BISMOL ?Take 30 mLs by mouth every 6 (six) hours as needed. ?  ?dexamethasone 4 MG tablet ?Commonly known as: DECADRON ?Take 1 tablet (4 mg total) by mouth every 8 (eight) hours. ?  ?diltiazem 180 MG 24 hr capsule ?Commonly known as: CARDIZEM CD ?Take 1 capsule (180 mg total) by mouth daily. ?  ?folic acid 1 MG tablet ?Commonly known as: FOLVITE ?Take 1 tablet (1 mg total) by mouth once daily. ?  ?levothyroxine 150 MCG tablet ?Commonly known as: Synthroid ?Take 1 tablet (150 mcg total) by mouth daily before breakfast. Take on empty stomach. ?  ?meclizine 25 MG tablet ?Commonly known as: ANTIVERT ?1 pill at night as needed; if dizziness not improved can take one pill every 8 hours as needed/tolerated. ?  ?Multi-Vitamins Tabs ?Take 1 tablet by mouth daily. ?  ?ondansetron 4 MG tablet ?Commonly known as: ZOFRAN ?Take 2 tablets (8mg  total) by mouth once every 8 hours as needed for nausea and vomiting. ?  ?TURMERIC PO ?Take 1 tablet by mouth daily. ?  ? ?  ? ?  ?  ? ? ?  ?Durable Medical Equipment  ?(From admission, onward)  ?  ? ? ?  ? ?  Start     Ordered  ? 01/03/22 0903  For home use only DME Wheelchair  electric  Once       ? 01/03/22 0903  ? 01/03/22 0902  For home use only DME Walker rolling  Once       ?Question Answer Comment  ?Walker: With 5 Inch Wheels   ?Patient needs a walker to treat with the following condition Metastatic lung cancer (metastasis from lung to other site) [facility])   ?  ? 01/03/22 0903  ? 01/03/22 0902  For home use only DME Bedside commode  Once       ?Question:  Patient needs a bedside commode to treat with the following condition  Answer:  Metastatic lung cancer (metastasis from lung to other site) [facility])  ? 01/03/22 0903  ? ?  ?  ? ?  ? ? Follow-up Information   ? ? [place] Follow up  in 1 week(s).   ?Contact information: ?[address] ?[place] [address] ?[phone] ? ? ?  ?  ? ? End, Harrell Gave, MD .   ?Specialty: Cardiology ?Contact information: ?[address] ?[place] [address] ?[phone] ? ? ?  ?  ? ?  ?  ? ?  ? ?Discharge Exam: ?Danley Danker Weights  ? 01/01/22 2015  ?Weight: 65 kg  ? ?General exam: Appears calm and comfortable  ?Respiratory system: Clear to auscultation. Respiratory effort normal. ?Cardiovascular system: S1 & S2 heard, RRR. No JVD, murmurs, rubs, gallops or clicks. No pedal edema. ?Gastrointestinal system: Abdomen is nondistended, soft and nontender. No organomegaly or masses felt. Normal bowel sounds heard. ?Central nervous system: Alert and oriented. No focal neurological deficits. ?Extremities: Symmetric 5 x 5 power. ?Skin: No rashes, lesions or ulcers ?Psychiatry: Judgement and insight appear normal. Mood & affect appropriate.  ? ? ?Condition at discharge: fair ? ?The results of significant diagnostics from this hospitalization (including imaging, microbiology, ancillary and laboratory) are listed below for reference.  ? ?Imaging Studies: ?DG Chest 2 View ? ?Result Date: 12/31/2021 ?CLINICAL DATA:  Suspected Sepsis EXAM: CHEST - 2 VIEW COMPARISON:  Chest x-ray 05/02/2021. FINDINGS: Redemonstrated right lung mass, better characterized on prior CT chest from 10/01/2021. Multiple subtle nodular areas of consolidation in both lungs. No visible pleural effusions or pneumothorax. Cardiomediastinal silhouette is similar. Right IJ approach central venous catheter with the tip projecting at the superior cavoatrial junction, similar. No displaced fracture. IMPRESSION: 1. Redemonstrated right lung mass, better characterized on prior CT chest from 10/01/2021. 2. Multiple subtle nodular areas of consolidation in both lungs, which could represent pneumonia, metastases, or artifact. Chest CT could further characterize if clinically warranted.  Electronically Signed   By: Margaretha Sheffield M.D.   On: 12/31/2021 17:31  ? ?CT Head Wo Contrast ? ?Result Date: 12/31/2021 ?CLINICAL DATA:  Altered mental status weakness EXAM: CT HEAD WITHOUT CONTRAST TECHNIQUE: Contiguous axial images were obtained from the base of the skull through the vertex without intravenous contrast. RADIATION DOSE REDUCTION: This exam was performed according to the departmental dose-optimization program which includes automated exposure control, adjustment of the mA and/or kV according to patient size and/or use of iterative reconstruction technique. COMPARISON:  MRI 10/24/2021 FINDINGS: Brain: No hemorrhage or large vessel territorial infarct. Worsened vasogenic edema within the left frontal and parietal white matter, suspicious for disease progression. Minimal 2 mm midline shift to the right. The ventricles are nonenlarged. Vascular: No hyperdense vessel or unexpected calcification. Carotid vascular calcification Skull: Normal. Negative for fracture or focal lesion. Sinuses/Orbits: No acute finding. Other: None IMPRESSION: 1. Worsened hypodensity/edema  within the left frontal and parietal white matter with minimal 2 mm midline shift to the right, concerning for disease progression. Follow-up MRI with and without contrast is recommended. Electronically Signed   By: Donavan Foil M.D.   On: 12/31/2021 17:31  ? ?CT Chest Wo Contrast ? ?Result Date: 12/31/2021 ?CLINICAL DATA:  Abnormal chest x-ray EXAM: CT CHEST WITHOUT CONTRAST TECHNIQUE: Multidetector CT imaging of the chest was performed following the standard protocol without IV contrast. RADIATION DOSE REDUCTION: This exam was performed according to the departmental dose-optimization program which includes automated exposure control, adjustment of the mA and/or kV according to patient size and/or use of iterative reconstruction technique. COMPARISON:  Chest x-ray 12/31/2021, CT 10/01/2021 FINDINGS: Cardiovascular: Limited evaluation  without intravenous contrast. Aorta is nonaneurysmal. Moderate aortic atherosclerosis. Normal cardiac size. No pericardial effusion. Right-sided central venous catheter tip at the cavoatrial junction. Mediastinum/Nodes

## 2022-01-03 NOTE — Telephone Encounter (Signed)
Please cancel the patient's appointment in the clinic today; also cancel appointment for ultrasound tomorrow ? ?Please Have the patient follow-up with nurse practitioner in 1 week; CBC BMP-  ? ?#Keep appointment with me as planned.  ? ?Thanks ?GB ?

## 2022-01-04 ENCOUNTER — Inpatient Hospital Stay: Admission: RE | Admit: 2022-01-04 | Payer: Medicaid Other | Source: Ambulatory Visit

## 2022-01-05 LAB — CULTURE, BLOOD (ROUTINE X 2)
Culture: NO GROWTH
Culture: NO GROWTH

## 2022-01-10 ENCOUNTER — Inpatient Hospital Stay: Payer: Medicaid Other

## 2022-01-10 ENCOUNTER — Encounter: Payer: Self-pay | Admitting: Hospice and Palliative Medicine

## 2022-01-10 ENCOUNTER — Other Ambulatory Visit: Payer: Self-pay

## 2022-01-10 ENCOUNTER — Encounter: Payer: Self-pay | Admitting: Internal Medicine

## 2022-01-10 ENCOUNTER — Telehealth: Payer: Self-pay | Admitting: Internal Medicine

## 2022-01-10 ENCOUNTER — Inpatient Hospital Stay: Payer: Medicaid Other | Attending: Internal Medicine | Admitting: Hospice and Palliative Medicine

## 2022-01-10 VITALS — BP 136/83 | HR 91 | Temp 98.2°F | Resp 17 | Wt 139.0 lb

## 2022-01-10 DIAGNOSIS — E038 Other specified hypothyroidism: Secondary | ICD-10-CM | POA: Diagnosis not present

## 2022-01-10 DIAGNOSIS — R110 Nausea: Secondary | ICD-10-CM | POA: Insufficient documentation

## 2022-01-10 DIAGNOSIS — E878 Other disorders of electrolyte and fluid balance, not elsewhere classified: Secondary | ICD-10-CM | POA: Insufficient documentation

## 2022-01-10 DIAGNOSIS — E871 Hypo-osmolality and hyponatremia: Secondary | ICD-10-CM | POA: Diagnosis not present

## 2022-01-10 DIAGNOSIS — I480 Paroxysmal atrial fibrillation: Secondary | ICD-10-CM | POA: Diagnosis not present

## 2022-01-10 DIAGNOSIS — J910 Malignant pleural effusion: Secondary | ICD-10-CM | POA: Insufficient documentation

## 2022-01-10 DIAGNOSIS — C3411 Malignant neoplasm of upper lobe, right bronchus or lung: Secondary | ICD-10-CM | POA: Insufficient documentation

## 2022-01-10 DIAGNOSIS — R739 Hyperglycemia, unspecified: Secondary | ICD-10-CM | POA: Diagnosis not present

## 2022-01-10 DIAGNOSIS — Z7952 Long term (current) use of systemic steroids: Secondary | ICD-10-CM | POA: Diagnosis not present

## 2022-01-10 DIAGNOSIS — J439 Emphysema, unspecified: Secondary | ICD-10-CM | POA: Diagnosis not present

## 2022-01-10 DIAGNOSIS — R4189 Other symptoms and signs involving cognitive functions and awareness: Secondary | ICD-10-CM | POA: Insufficient documentation

## 2022-01-10 DIAGNOSIS — R200 Anesthesia of skin: Secondary | ICD-10-CM | POA: Diagnosis not present

## 2022-01-10 DIAGNOSIS — D696 Thrombocytopenia, unspecified: Secondary | ICD-10-CM | POA: Diagnosis not present

## 2022-01-10 DIAGNOSIS — Z7984 Long term (current) use of oral hypoglycemic drugs: Secondary | ICD-10-CM | POA: Diagnosis not present

## 2022-01-10 DIAGNOSIS — R590 Localized enlarged lymph nodes: Secondary | ICD-10-CM | POA: Insufficient documentation

## 2022-01-10 DIAGNOSIS — C7931 Secondary malignant neoplasm of brain: Secondary | ICD-10-CM | POA: Diagnosis not present

## 2022-01-10 DIAGNOSIS — Z87891 Personal history of nicotine dependence: Secondary | ICD-10-CM | POA: Diagnosis not present

## 2022-01-10 DIAGNOSIS — E875 Hyperkalemia: Secondary | ICD-10-CM | POA: Insufficient documentation

## 2022-01-10 LAB — COMPREHENSIVE METABOLIC PANEL
ALT: 110 U/L — ABNORMAL HIGH (ref 0–44)
AST: 67 U/L — ABNORMAL HIGH (ref 15–41)
Albumin: 3.4 g/dL — ABNORMAL LOW (ref 3.5–5.0)
Alkaline Phosphatase: 76 U/L (ref 38–126)
Anion gap: 8 (ref 5–15)
BUN: 32 mg/dL — ABNORMAL HIGH (ref 6–20)
CO2: 25 mmol/L (ref 22–32)
Calcium: 9.1 mg/dL (ref 8.9–10.3)
Chloride: 99 mmol/L (ref 98–111)
Creatinine, Ser: 0.72 mg/dL (ref 0.44–1.00)
GFR, Estimated: >60 mL/min (ref >60)
Glucose, Bld: 225 mg/dL — ABNORMAL HIGH (ref 70–99)
Potassium: 4.4 mmol/L (ref 3.5–5.1)
Sodium: 132 mmol/L — ABNORMAL LOW (ref 135–145)
Total Bilirubin: 0.6 mg/dL (ref 0.3–1.2)
Total Protein: 7.4 g/dL (ref 6.5–8.1)

## 2022-01-10 LAB — CBC WITH DIFFERENTIAL/PLATELET
Abs Immature Granulocytes: 0.88 10*3/uL — ABNORMAL HIGH (ref 0.00–0.07)
Basophils Absolute: 0.1 10*3/uL (ref 0.0–0.1)
Basophils Relative: 1 %
Eosinophils Absolute: 0 10*3/uL (ref 0.0–0.5)
Eosinophils Relative: 0 %
HCT: 40.2 % (ref 36.0–46.0)
Hemoglobin: 13.8 g/dL (ref 12.0–15.0)
Immature Granulocytes: 5 %
Lymphocytes Relative: 6 %
Lymphs Abs: 1.1 10*3/uL (ref 0.7–4.0)
MCH: 33.7 pg (ref 26.0–34.0)
MCHC: 34.3 g/dL (ref 30.0–36.0)
MCV: 98.3 fL (ref 80.0–100.0)
Monocytes Absolute: 1.5 10*3/uL — ABNORMAL HIGH (ref 0.1–1.0)
Monocytes Relative: 8 %
Neutro Abs: 16.1 10*3/uL — ABNORMAL HIGH (ref 1.7–7.7)
Neutrophils Relative %: 80 %
Platelets: 187 10*3/uL (ref 150–400)
RBC: 4.09 MIL/uL (ref 3.87–5.11)
RDW: 13.3 % (ref 11.5–15.5)
WBC: 19.8 10*3/uL — ABNORMAL HIGH (ref 4.0–10.5)
nRBC: 0.2 % (ref 0.0–0.2)

## 2022-01-10 LAB — TSH: TSH: 0.011 u[IU]/mL — ABNORMAL LOW (ref 0.350–4.500)

## 2022-01-10 MED ORDER — DEXAMETHASONE 4 MG PO TABS: 4.0000 mg | ORAL_TABLET | Freq: Three times a day (TID) | ORAL | 0 refills | Status: DC

## 2022-01-10 NOTE — Telephone Encounter (Signed)
Patient's daughter called and requested more information about getting home health services for her mom. She needs assistance with bathing, medication management and fall prevention.  ? ?Routing to clinical team.  ?

## 2022-01-10 NOTE — Progress Notes (Signed)
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

## 2022-01-10 NOTE — Telephone Encounter (Signed)
Has an appointment with Josh B today. ? ?Documented by social worker during hospital admission that they were unable to obtain [facility] due to insurance. Pt and family was informed and agreed to outpatient therapy at the hospital. DME was ordered through [employer]. ?

## 2022-01-10 NOTE — Progress Notes (Signed)
? ?Symptom Management Clinic ?[place] at [facility] ?Telephone:(336) [phone] [phone] ? ?Patient Care Team: ?[place] as PCP - General ?End, Harrell Gave, MD as PCP - Cardiology (Cardiology) ?Telford Nab, RN as Sales executive  ? ?Name of the patient: Nichole Park  ?[id number]  ?1960-11-18  ? ?Date of visit: 01/10/22 ? ?Reason for Consult: ?Nichole Park is a 61 y.o. 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  Patient was hospitalized 12/31/2021 to 01/03/2022 with right-sided hemiparesis secondary to worsening brain metastases.  Patient was referred to XRT and improved on steroids. ? ?Patient presents to [facility] for post hospital follow-up.  She reports that she is overall improved.  She is ambulating with use of a walker.  She continues to have numbness in her right lower extremity.  She continues to have some cognitive deficits with memory impairment.  She also had a couple falls immediately following discharge from the hospital but denies any falls recently.  She does endorse compliance with her steroid.  She denies fever or chills.  No GI or GU symptoms.  Patient reports increased appetite on steroids. ? ?Denies recent fevers or illnesses. Denies any easy bleeding or bruising. Reports good appetite and denies weight loss. Denies chest pain. Denies any nausea, vomiting, constipation, or diarrhea. Denies urinary complaints. Patient offers no further specific complaints today. ? ? ? ?PAST MEDICAL HISTORY: ?Past Medical History:  ?Diagnosis Date  ? Graves disease   ? Malignant pericardial effusion   ? Paroxysmal atrial fibrillation (HCC)   ? Primary lung adenocarcinoma ([place])   ? ? ?PAST SURGICAL HISTORY:  ?Past Surgical History:  ?Procedure Laterality Date  ? IR CV LINE INJECTION  06/19/2021  ?  IR IMAGING GUIDED PORT INSERTION  04/27/2021  ? PERICARDIOCENTESIS N/A 03/27/2021  ? Procedure: PERICARDIOCENTESIS;  Surgeon: Nelva Bush, MD;  Location: [facility];  Service: Cardiovascular;  Laterality: N/A;  ? TUBAL LIGATION    ? ? ?HEMATOLOGY/ONCOLOGY HISTORY:  ?Oncology History Overview Note  ?# AUG [id number] IV adenocarcinoma of the lung [s/p pericardial effusion/tamponade]. MRI Brain AUG 16th, 2022- IMPRESSION: ?Multiple peripherally enhancing, centrally T1 hypointense lesions, ?with surrounding T2 hyperintense signal in the bilateral cerebral ?hemispheres, concerning for metastatic disease. ?  ? ?# AUG 17th 2022- CARBO-ALIMTA-Keytruda #1 ? ?# Mammogram- FEB 2023-no lesions noted in the breasts; 1 to 2 cm lymph node left underarm;. LYMPH NODE, LEFT AXILLARY; ULTRASOUND-GUIDED CORE NEEDLE BIOPSY:  ?- POSITIVE FOR MALIGNANCY.  ?- METASTATIC ADENOCARCINOMA, COMPATIBLE WITH PULMONARY PRIMARY ? ? # Repeat MRI brain- 11/22 Mildly increased size of a left parietal metastasis with increased, mild edema. Decreased size of 2 other metastases. S/p SRS- finished on DEC 23rd, 2022- MRI FEB 2023-improved brain lesions ? ?# JAN 2023-iatrogenic hypothyroidism- TSH 83; synthroid 145mg ? ?#Acute respiratory failure BiPAP/ICU.  S/p pericardiocentesis. ? ?NGS; PDL-1-0 ? ?  ?Malignant pericardial effusion  ?03/28/2021 Initial Diagnosis  ? Malignant pericardial effusion (HCC) ? ?  ?04/25/2021 -  Chemotherapy  ? Patient is on Treatment Plan : LUNG CARBOplatin / Pemetrexed / Pembrolizumab q21d Induction x 4 cycles / Maintenance Pemetrexed + Pembrolizumab  ? ?  ?  ?Cancer of [place] lobe of right lung ([place]  ?04/10/2021 Initial Diagnosis  ? [place] lung [place] ? ?  ?04/10/2021 Cancer Staging  ? Staging form: Lung, AJCC 8th Edition ?-  Clinical: Stage IVA (cT2, cN2, cM1a) - Signed by Cammie Sickle, MD on 04/10/2021 ? ?  ? ? ?ALLERGIES:  is allergic to diazepam, keytruda [pembrolizumab], metoprolol, and  tapazole [methimazole]. ? ?MEDICATIONS:  ?Current Outpatient Medications  ?Medication Sig Dispense Refill  ? bismuth subsalicylate (PEPTO BISMOL) 262 MG/15ML suspension Take 30 mLs by mouth every 6 (six) hours as needed.    ? dexamethasone (DECADRON) 4 MG tablet Take 1 tablet (4 mg total) by mouth every 8 (eight) hours. 24 tablet 0  ? diltiazem (CARDIZEM CD) 180 MG 24 hr capsule Take 1 capsule (180 mg total) by mouth daily. 90 capsule 3  ? folic acid (FOLVITE) 1 MG tablet Take 1 tablet (1 mg total) by mouth once daily. 90 tablet 1  ? levothyroxine (SYNTHROID) 150 MCG tablet Take 1 tablet (150 mcg total) by mouth daily. 90 tablet 0  ? Multiple Vitamin (MULTI-VITAMINS) TABS Take 1 tablet by mouth daily.    ? ondansetron (ZOFRAN) 4 MG tablet Take 2 tablets (65m total) by mouth once every 8 hours as needed for nausea and vomiting. 80 tablet 1  ? TURMERIC PO Take 1 tablet by mouth daily.    ? meclizine (ANTIVERT) 25 MG tablet 1 pill at night as needed; if dizziness not improved can take one pill every 8 hours as needed/tolerated. (Patient not taking: Reported on 12/31/2021) 30 tablet 0  ? ?No current facility-administered medications for this visit.  ? ? ?VITAL SIGNS: ?BP 136/83 (Patient Position: Sitting)   Pulse 91   Temp 98.2 ?F (36.8 ?C) (Oral)   Resp 17   Wt 139 lb (63 kg)   SpO2 99%   BMI 25.84 kg/m?  ?Filed Weights  ? 01/10/22 1348  ?Weight: 139 lb (63 kg)  ?  ?Estimated body mass index is 25.84 kg/m? as calculated from the following: ?  Height as of 01/01/22: 5' 1.5" (1.562 m). ?  Weight as of this encounter: 139 lb (63 kg). ? ?LABS: ?CBC: ?   ?Component Value Date/Time  ? WBC 19.8 (H) 01/10/2022 1322  ? HGB 13.8 01/10/2022 1322  ? HGB 15.9 12/01/2013 1440  ? HCT 40.2 01/10/2022 1322  ? HCT 46.8 12/01/2013 1440  ? PLT 187 01/10/2022 1322  ? PLT 236 12/01/2013 1440  ? MCV 98.3 01/10/2022 1322  ? MCV 87 12/01/2013 1440  ? NEUTROABS 16.1 (H) 01/10/2022 1322  ? LYMPHSABS 1.1 01/10/2022 1322  ? MONOABS 1.5 (H)  01/10/2022 1322  ? EOSABS 0.0 01/10/2022 1322  ? BASOSABS 0.1 01/10/2022 1322  ? ?Comprehensive Metabolic Panel: ?   ?Component Value Date/Time  ? NA 132 (L) 01/10/2022 1322  ? NA 137 12/01/2013 1440  ? K 4.4 01/10/2022 1322  ? K 3.7 12/01/2013 1440  ? CL 99 01/10/2022 1322  ? CL 108 (H) 12/01/2013 1440  ? CO2 25 01/10/2022 1322  ? CO2 25 12/01/2013 1440  ? BUN 32 (H) 01/10/2022 1322  ? BUN 8 12/01/2013 1440  ? CREATININE 0.72 01/10/2022 1322  ? CREATININE 0.43 (L) 12/01/2013 1440  ? GLUCOSE 225 (H) 01/10/2022 1322  ? GLUCOSE 117 (H) 12/01/2013 1440  ? CALCIUM 9.1 01/10/2022 1322  ? CALCIUM 8.8 12/01/2013 1440  ? AST 67 (H) 01/10/2022 1322  ? AST 35 12/01/2013 1440  ? ALT 110 (H) 01/10/2022 1322  ? ALT 36 12/01/2013 1440  ? ALKPHOS 76 01/10/2022 1322  ? ALKPHOS 327 (H) 12/01/2013 1440  ? BILITOT 0.6 01/10/2022 1322  ? BILITOT 0.4 12/01/2013 1440  ?  PROT 7.4 01/10/2022 1322  ? PROT 7.8 12/01/2013 1440  ? ALBUMIN 3.4 (L) 01/10/2022 1322  ? ALBUMIN 3.7 12/01/2013 1440  ? ? ?RADIOGRAPHIC STUDIES: ?DG Chest 2 View ? ?Result Date: 12/31/2021 ?CLINICAL DATA:  Suspected Sepsis EXAM: CHEST - 2 VIEW COMPARISON:  Chest x-ray 05/02/2021. FINDINGS: Redemonstrated right lung mass, better characterized on prior CT chest from 10/01/2021. Multiple subtle nodular areas of consolidation in both lungs. No visible pleural effusions or pneumothorax. Cardiomediastinal silhouette is similar. Right IJ approach central venous catheter with the tip projecting at the superior cavoatrial junction, similar. No displaced fracture. IMPRESSION: 1. Redemonstrated right lung mass, better characterized on prior CT chest from 10/01/2021. 2. Multiple subtle nodular areas of consolidation in both lungs, which could represent pneumonia, metastases, or artifact. Chest CT could further characterize if clinically warranted. Electronically Signed   By: Margaretha Sheffield M.D.   On: 12/31/2021 17:31  ? ?CT Head Wo Contrast ? ?Result Date: 12/31/2021 ?CLINICAL  DATA:  Altered mental status weakness EXAM: CT HEAD WITHOUT CONTRAST TECHNIQUE: Contiguous axial images were obtained from the base of the skull through the vertex without intravenous contrast. RADIATION DOSE R

## 2022-01-14 ENCOUNTER — Telehealth: Payer: Self-pay

## 2022-01-14 ENCOUNTER — Telehealth: Payer: Self-pay | Admitting: Student

## 2022-01-14 ENCOUNTER — Encounter: Payer: Self-pay | Admitting: Internal Medicine

## 2022-01-14 DIAGNOSIS — C3411 Malignant neoplasm of upper lobe, right bronchus or lung: Secondary | ICD-10-CM

## 2022-01-14 NOTE — Telephone Encounter (Signed)
Ret'd call to patient's daughter Summer Harden [facility]) and spoke with her about the Palliative referral/services and all questions were answered and verbal consent obtained.  I have scheduled a MyChart Palliative Consult for 01/15/22 @ 1:30 PM.  ?

## 2022-01-14 NOTE — Telephone Encounter (Signed)
Pete Glatter, NP referred patient to OT evaluation at the [facility].  Patient's daughter requested that patient receive HH/PT/OT services at home.  Will send referral for [facility] services. ?

## 2022-01-15 ENCOUNTER — Telehealth: Payer: Medicaid Other | Admitting: Student

## 2022-01-15 ENCOUNTER — Encounter: Payer: Self-pay | Admitting: Internal Medicine

## 2022-01-15 DIAGNOSIS — Z515 Encounter for palliative care: Secondary | ICD-10-CM

## 2022-01-15 DIAGNOSIS — C3490 Malignant neoplasm of unspecified part of unspecified bronchus or lung: Secondary | ICD-10-CM

## 2022-01-15 DIAGNOSIS — R531 Weakness: Secondary | ICD-10-CM

## 2022-01-15 NOTE — Telephone Encounter (Signed)
Left message on patient's voicemail with an update on [facility] services and will inform the CC referral coordinator to proceed with setting up an appointment the OT at the [facility]. ?

## 2022-01-15 NOTE — Telephone Encounter (Signed)
Message received form Lavonia Drafts, [employer] representative, that due to insurance patient not able to receive [facility] services.   ? ? ?

## 2022-01-15 NOTE — Progress Notes (Signed)
? ? ?Manufacturing engineer ?Community Palliative Care Consult Note ?Telephone: [phone]  ?Fax: [phone]  ? ?Date of encounter: 01/15/22 ?1:53 PM ?PATIENT NAME: Nichole Park ?Nichole Park [id number]   ?[phone] (home)  ?DOB: March 22, 1961 ?MRN: [id number] ?PRIMARY CARE PROVIDER:    ?[place],  ?[place], [place] ?[place] [address] ?[phone] ? ?REFERRING PROVIDER:   ?[facility] ?[address] [place] [place] ?[place],  [place] [address] ?[phone] ? ?RESPONSIBLE PARTY:    ?Contact Information   ? ? Name Relation Home Work Mobile  ? Harden,Summer Daughter   [phone]  ? ?  ? ? ?Due to the COVID-19 crisis, this visit was done via telemedicine from my office and it was initiated and consent by this patient and or family. ? ?I connected with  Donovan Kail OR PROXY on 01/15/22 by a video enabled telemedicine application and verified that I am speaking with the correct person using two identifiers. ?  ?I discussed the limitations of evaluation and management by telemedicine. The patient expressed understanding and agreed to proceed.  ? ? ?                                 ASSESSMENT AND PLAN / RECOMMENDATIONS:  ? ?Advance Care Planning/Goals of Care: Goals include to maximize quality of life and symptom management. Patient/health care surrogate gave his/her permission to discuss.Our advance care planning conversation included a discussion about:    ?The value and importance of advance care planning  ?Experiences with loved ones who have been seriously ill or have died  ?Exploration of personal, cultural or spiritual beliefs that might influence medical decisions  ?Exploration of goals of care in the event of a sudden injury or illness  ?CODE STATUS: Full Code ? ?Education provided on palliative medicine vs. Hospice services. Patient plans to receive chemo radiation. She is needing additional caregiver support in the home. Will refer to  palliative SW. She is awaiting to have therapy in the home.  ? ?Symptom Management/Plan: ? ?Stage 4 adenocarcinoma of right lung with metastasis to brain- continue chemo radiation as directed. Decadron 4 mg every 8 hours. Follow up with oncology as scheduled.  ? ?Generalized weakness-continue using walker for ambulation. PT/OT as directed. Monitor for falls/safety. ? ?Follow up Palliative Care Visit: Palliative care will continue to follow for complex medical decision making, advance care planning, and clarification of goals. PC in 3 weeks; Return 6 weeks or prn. ? ? ?This visit was coded based on medical decision making (MDM). ? ?PPS: 50%-60% at best ? ?HOSPICE ELIGIBILITY/DIAGNOSIS: TBD ? ?Chief Complaint: Palliative Medicine initial consult.  ? ?HISTORY OF PRESENT ILLNESS:  Nichole BREEDING is a 61 y.o. year old female  with lung cancer with metastasis to brain, COPD, hypertension, hypothyroidism, paroxysmal atrial fibrillation. ? ?Patient denies pain, headaches, worsening shortness of breath with exertion; dizziness has improved. Daughter reports impaired cognition, difficulty processing, moving slower. Patient with generalized weakness; awaiting to start therapy. Patient states she is to resume chemotherapy; previously on Alimta-Keytruda; unsure if she will resume this or switch to alternative. She is to receive XRT. ? ?History obtained from review of EMR, discussion with primary team, and interview with family, facility staff/caregiver and/or Ms. Kope.  ?I reviewed available labs, medications, imaging, studies and related documents from the EMR.  Records reviewed and summarized above.  ? ?  ROS ? ?General: NAD ?EYES: denies vision changes ?ENMT: denies dysphagia ?Cardiovascular: denies chest pain, denies DOE ?Pulmonary: denies cough, denies increased SOB ?Abdomen: endorses good appetite, denies constipation, endorses continence of bowel ?GU: denies dysuria,  ?MSK: increased weakness ?Skin: denies rashes or  wounds ?Neurological: denies pain, denies insomnia ?Psych: Endorses stable mood ?Heme/lymph/immuno: denies bruises, abnormal bleeding ? ?Physical Exam: ?Constitutional: NAD ?General: frail appearing  ?EYES: anicteric sclera, lids intact, no discharge  ?ENMT: intact hearing, dentition intact ?CV: no LE edema ?Pulmonary: no increased work of breathing, no cough, room air ?Abdomen: no ascites ?GU: deferred ?MSK: ambulatory ?Skin: warm and dry, no rashes or wounds on visible skin ?Neuro: +generalized weakness ?Psych: non-anxious affect, A and O x 3 ?Hem/lymph/immuno: no widespread bruising ?CURRENT PROBLEM LIST:  ?Patient Active Problem List  ? Diagnosis Date Noted  ? Hypothyroidism 01/01/2022  ? Paroxysmal atrial fibrillation ([place]) 01/01/2022  ? Lung cancer metastatic to brain [facility]) 01/01/2022  ? Metastatic lung cancer (metastasis from lung to other site) [facility]) 12/31/2021  ? Cancer of upper lobe of right lung ([place]) 04/10/2021  ? Lung mass   ? Palliative care encounter   ? PAF (paroxysmal atrial fibrillation) ([place]) 03/30/2021  ? COPD (chronic obstructive pulmonary disease) ([place]) 03/30/2021  ? Hyperthyroidism 03/29/2021  ? Malignant pericardial effusion   ? Tobacco use   ? Cardiac tamponade 03/27/2021  ? Cardiac/pericardial tamponade 03/27/2021  ? Hypertension 03/22/2014  ? ?PAST MEDICAL HISTORY:  ?Active Ambulatory Problems  ?  Diagnosis Date Noted  ? Cardiac tamponade 03/27/2021  ? Cardiac/pericardial tamponade 03/27/2021  ? Malignant pericardial effusion   ? Tobacco use   ? Hypertension 03/22/2014  ? Hyperthyroidism 03/29/2021  ? PAF (paroxysmal atrial fibrillation) ([place]) 03/30/2021  ? COPD (chronic obstructive pulmonary disease) ([place]) 03/30/2021  ? Lung mass   ? Palliative care encounter   ? Cancer of upper lobe of right lung ([place]) 04/10/2021  ? Metastatic lung cancer (metastasis from lung to other site) [facility]) 12/31/2021  ? Hypothyroidism 01/01/2022  ? Paroxysmal atrial fibrillation ([place]) 01/01/2022  ? Lung cancer  metastatic to brain [facility]) 01/01/2022  ? ?Resolved Ambulatory Problems  ?  Diagnosis Date Noted  ? No Resolved Ambulatory Problems  ? ?Past Medical History:  ?Diagnosis Date  ? Graves disease   ? Primary lung adenocarcinoma ([place])   ? ?SOCIAL HX:  ?Social History  ? ?Tobacco Use  ? Smoking status: Former  ?  Packs/day: 1.00  ?  Years: 40.00  ?  Pack years: 40.00  ?  Types: Cigarettes  ? Smokeless tobacco: Never  ?Substance Use Topics  ? Alcohol use: No  ? ?FAMILY HX:  ?Family History  ?Problem Relation Age of Onset  ? Peripheral Artery Disease Mother   ? Breast cancer Neg Hx   ?   ? ?ALLERGIES:  ?Allergies  ?Allergen Reactions  ? Diazepam Other (See Comments)  ?  Very high emotionally and very low emotionally, hyperventilate and passes out ?Very high emotionally and very low emotionally, hyperventilate and passes out ?  ? Keytruda [Pembrolizumab] Rash  ? Metoprolol   ? Tapazole [Methimazole]   ?   ?PERTINENT MEDICATIONS:  ?Outpatient Encounter Medications as of 01/15/2022  ?Medication Sig  ? bismuth subsalicylate (PEPTO BISMOL) 262 MG/15ML suspension Take 30 mLs by mouth every 6 (six) hours as needed.  ? dexamethasone (DECADRON) 4 MG tablet Take 1 tablet (4 mg total) by mouth every 8 (eight) hours.  ? diltiazem (CARDIZEM CD) 180 MG 24 hr capsule Take 1 capsule (  180 mg total) by mouth daily.  ? folic acid (FOLVITE) 1 MG tablet Take 1 tablet (1 mg total) by mouth once daily.  ? levothyroxine (SYNTHROID) 150 MCG tablet Take 1 tablet (150 mcg total) by mouth daily.  ? meclizine (ANTIVERT) 25 MG tablet 1 pill at night as needed; if dizziness not improved can take one pill every 8 hours as needed/tolerated. (Patient not taking: Reported on 12/31/2021)  ? Multiple Vitamin (MULTI-VITAMINS) TABS Take 1 tablet by mouth daily.  ? ondansetron (ZOFRAN) 4 MG tablet Take 2 tablets (8mg  total) by mouth once every 8 hours as needed for nausea and vomiting.  ? TURMERIC PO Take 1 tablet by mouth daily.  ? ?No facility-administered  encounter medications on file as of 01/15/2022.  ? ?Thank you for the opportunity to participate in the care of Ms. Carrell.  The palliative care team will continue to follow. Please call our office at [phone] if we can be

## 2022-01-16 ENCOUNTER — Encounter: Payer: Self-pay | Admitting: Internal Medicine

## 2022-01-17 ENCOUNTER — Telehealth: Payer: Self-pay | Admitting: Internal Medicine

## 2022-01-17 NOTE — Telephone Encounter (Signed)
The office did receive a letter from another insurance company regarding a voluntary recall of multiple drug products and EMLA cream is included in the list.  The letter stated reason for voluntary recall because the company declaring bankruptcy in 10/2021.  The letter also stated that Patients shoulc contact their pharmacy for a replacement.   ? ?Called patient's daughter to inform her of the voluntary recall and we are waiting for a return call from pharmacy.   ?

## 2022-01-17 NOTE — Telephone Encounter (Signed)
Spoke to patient's son for more detail.  They received a letter from her insurance company that the EMLA cream has been recalled and to discard/do not use what they may have.  The letter did include a lot and exp number that matched what she has.   ? ?[place] pharmacy for clarification of recall.  The pharmacy is going to look in to this and return my call with further details/instructions. ?

## 2022-01-17 NOTE — Telephone Encounter (Signed)
Patient left message with messaging service ? ?Port hole medicine : recalled  ? ?She is unsure what to do and has an infusion in the morning ?

## 2022-01-18 ENCOUNTER — Emergency Department: Payer: Medicaid Other

## 2022-01-18 ENCOUNTER — Encounter: Payer: Self-pay | Admitting: Internal Medicine

## 2022-01-18 ENCOUNTER — Encounter: Payer: Self-pay | Admitting: Emergency Medicine

## 2022-01-18 ENCOUNTER — Inpatient Hospital Stay
Admission: EM | Admit: 2022-01-18 | Discharge: 2022-01-21 | DRG: 641 | Disposition: A | Discharge status: Home Health | Payer: Medicaid Other | Source: Ambulatory Visit | Attending: Hospitalist | Admitting: Hospitalist

## 2022-01-18 ENCOUNTER — Inpatient Hospital Stay: Payer: Medicaid Other

## 2022-01-18 ENCOUNTER — Other Ambulatory Visit: Payer: Self-pay

## 2022-01-18 ENCOUNTER — Inpatient Hospital Stay (HOSPITAL_BASED_OUTPATIENT_CLINIC_OR_DEPARTMENT_OTHER): Payer: Medicaid Other | Admitting: Internal Medicine

## 2022-01-18 DIAGNOSIS — R531 Weakness: Principal | ICD-10-CM

## 2022-01-18 DIAGNOSIS — E872 Acidosis, unspecified: Secondary | ICD-10-CM | POA: Diagnosis present

## 2022-01-18 DIAGNOSIS — R739 Hyperglycemia, unspecified: Secondary | ICD-10-CM | POA: Diagnosis not present

## 2022-01-18 DIAGNOSIS — C3411 Malignant neoplasm of upper lobe, right bronchus or lung: Secondary | ICD-10-CM

## 2022-01-18 DIAGNOSIS — T381X5A Adverse effect of thyroid hormones and substitutes, initial encounter: Secondary | ICD-10-CM | POA: Diagnosis present

## 2022-01-18 DIAGNOSIS — E875 Hyperkalemia: Secondary | ICD-10-CM | POA: Diagnosis present

## 2022-01-18 DIAGNOSIS — Z6824 Body mass index (BMI) 24.0-24.9, adult: Secondary | ICD-10-CM

## 2022-01-18 DIAGNOSIS — Z9221 Personal history of antineoplastic chemotherapy: Secondary | ICD-10-CM

## 2022-01-18 DIAGNOSIS — I10 Essential (primary) hypertension: Secondary | ICD-10-CM | POA: Diagnosis present

## 2022-01-18 DIAGNOSIS — C3490 Malignant neoplasm of unspecified part of unspecified bronchus or lung: Secondary | ICD-10-CM | POA: Diagnosis present

## 2022-01-18 DIAGNOSIS — C7931 Secondary malignant neoplasm of brain: Secondary | ICD-10-CM | POA: Diagnosis present

## 2022-01-18 DIAGNOSIS — E871 Hypo-osmolality and hyponatremia: Secondary | ICD-10-CM | POA: Diagnosis present

## 2022-01-18 DIAGNOSIS — Z79899 Other long term (current) drug therapy: Secondary | ICD-10-CM

## 2022-01-18 DIAGNOSIS — E860 Dehydration: Secondary | ICD-10-CM | POA: Diagnosis present

## 2022-01-18 DIAGNOSIS — T380X5A Adverse effect of glucocorticoids and synthetic analogues, initial encounter: Secondary | ICD-10-CM | POA: Diagnosis present

## 2022-01-18 DIAGNOSIS — Z79890 Hormone replacement therapy (postmenopausal): Secondary | ICD-10-CM

## 2022-01-18 DIAGNOSIS — R5381 Other malaise: Secondary | ICD-10-CM | POA: Diagnosis present

## 2022-01-18 DIAGNOSIS — I3131 Malignant pericardial effusion in diseases classified elsewhere: Secondary | ICD-10-CM | POA: Diagnosis present

## 2022-01-18 DIAGNOSIS — Z8249 Family history of ischemic heart disease and other diseases of the circulatory system: Secondary | ICD-10-CM

## 2022-01-18 DIAGNOSIS — E0500 Thyrotoxicosis with diffuse goiter without thyrotoxic crisis or storm: Secondary | ICD-10-CM | POA: Diagnosis present

## 2022-01-18 DIAGNOSIS — R590 Localized enlarged lymph nodes: Secondary | ICD-10-CM | POA: Diagnosis present

## 2022-01-18 DIAGNOSIS — Z888 Allergy status to other drugs, medicaments and biological substances status: Secondary | ICD-10-CM

## 2022-01-18 DIAGNOSIS — E878 Other disorders of electrolyte and fluid balance, not elsewhere classified: Secondary | ICD-10-CM | POA: Diagnosis present

## 2022-01-18 DIAGNOSIS — R627 Adult failure to thrive: Secondary | ICD-10-CM | POA: Diagnosis present

## 2022-01-18 DIAGNOSIS — D72829 Elevated white blood cell count, unspecified: Secondary | ICD-10-CM | POA: Diagnosis present

## 2022-01-18 DIAGNOSIS — T68XXXA Hypothermia, initial encounter: Secondary | ICD-10-CM | POA: Diagnosis present

## 2022-01-18 DIAGNOSIS — Z87891 Personal history of nicotine dependence: Secondary | ICD-10-CM

## 2022-01-18 DIAGNOSIS — I480 Paroxysmal atrial fibrillation: Secondary | ICD-10-CM | POA: Diagnosis present

## 2022-01-18 DIAGNOSIS — R350 Frequency of micturition: Secondary | ICD-10-CM | POA: Diagnosis present

## 2022-01-18 LAB — COMPREHENSIVE METABOLIC PANEL
ALT: 179 U/L — ABNORMAL HIGH (ref 0–44)
ALT: 161 U/L — ABNORMAL HIGH (ref 0–44)
ALT: 158 U/L — ABNORMAL HIGH (ref 0–44)
AST: 65 U/L — ABNORMAL HIGH (ref 15–41)
AST: 50 U/L — ABNORMAL HIGH (ref 15–41)
AST: 66 U/L — ABNORMAL HIGH (ref 15–41)
Albumin: 3.4 g/dL — ABNORMAL LOW (ref 3.5–5.0)
Albumin: 3.1 g/dL — ABNORMAL LOW (ref 3.5–5.0)
Albumin: 3.1 g/dL — ABNORMAL LOW (ref 3.5–5.0)
Alkaline Phosphatase: 86 U/L (ref 38–126)
Alkaline Phosphatase: 75 U/L (ref 38–126)
Alkaline Phosphatase: 79 U/L (ref 38–126)
Anion gap: 11 (ref 5–15)
Anion gap: 9 (ref 5–15)
Anion gap: 12 (ref 5–15)
BUN: 40 mg/dL — ABNORMAL HIGH (ref 6–20)
BUN: 40 mg/dL — ABNORMAL HIGH (ref 6–20)
BUN: 37 mg/dL — ABNORMAL HIGH (ref 6–20)
CO2: 22 mmol/L (ref 22–32)
CO2: 23 mmol/L (ref 22–32)
CO2: 22 mmol/L (ref 22–32)
Calcium: 9 mg/dL (ref 8.9–10.3)
Calcium: 8.6 mg/dL — ABNORMAL LOW (ref 8.9–10.3)
Calcium: 9 mg/dL (ref 8.9–10.3)
Chloride: 94 mmol/L — ABNORMAL LOW (ref 98–111)
Chloride: 100 mmol/L (ref 98–111)
Chloride: 97 mmol/L — ABNORMAL LOW (ref 98–111)
Creatinine, Ser: 0.82 mg/dL (ref 0.44–1.00)
Creatinine, Ser: 0.68 mg/dL (ref 0.44–1.00)
Creatinine, Ser: 0.85 mg/dL (ref 0.44–1.00)
GFR, Estimated: >60 mL/min (ref >60)
GFR, Estimated: >60 mL/min (ref >60)
GFR, Estimated: >60 mL/min (ref >60)
Glucose, Bld: 461 mg/dL — ABNORMAL HIGH (ref 70–99)
Glucose, Bld: 404 mg/dL — ABNORMAL HIGH (ref 70–99)
Glucose, Bld: 484 mg/dL — ABNORMAL HIGH (ref 70–99)
Potassium: 5.3 mmol/L — ABNORMAL HIGH (ref 3.5–5.1)
Potassium: 4.7 mmol/L (ref 3.5–5.1)
Potassium: 5.2 mmol/L — ABNORMAL HIGH (ref 3.5–5.1)
Sodium: 127 mmol/L — ABNORMAL LOW (ref 135–145)
Sodium: 132 mmol/L — ABNORMAL LOW (ref 135–145)
Sodium: 131 mmol/L — ABNORMAL LOW (ref 135–145)
Total Bilirubin: 1.2 mg/dL (ref 0.3–1.2)
Total Bilirubin: 1.4 mg/dL — ABNORMAL HIGH (ref 0.3–1.2)
Total Bilirubin: 1.2 mg/dL (ref 0.3–1.2)
Total Protein: 6.8 g/dL (ref 6.5–8.1)
Total Protein: 6.2 g/dL — ABNORMAL LOW (ref 6.5–8.1)
Total Protein: 6.5 g/dL (ref 6.5–8.1)

## 2022-01-18 LAB — CBC WITH DIFFERENTIAL/PLATELET
Abs Immature Granulocytes: 0.31 10*3/uL — ABNORMAL HIGH (ref 0.00–0.07)
Basophils Absolute: 0.1 10*3/uL (ref 0.0–0.1)
Basophils Relative: 0 %
Eosinophils Absolute: 0 10*3/uL (ref 0.0–0.5)
Eosinophils Relative: 0 %
HCT: 46.1 % — ABNORMAL HIGH (ref 36.0–46.0)
Hemoglobin: 16.1 g/dL — ABNORMAL HIGH (ref 12.0–15.0)
Immature Granulocytes: 2 %
Lymphocytes Relative: 5 %
Lymphs Abs: 1 10*3/uL (ref 0.7–4.0)
MCH: 33.2 pg (ref 26.0–34.0)
MCHC: 34.9 g/dL (ref 30.0–36.0)
MCV: 95.1 fL (ref 80.0–100.0)
Monocytes Absolute: 0.9 10*3/uL (ref 0.1–1.0)
Monocytes Relative: 5 %
Neutro Abs: 16.5 10*3/uL — ABNORMAL HIGH (ref 1.7–7.7)
Neutrophils Relative %: 88 %
Platelets: 152 10*3/uL (ref 150–400)
RBC: 4.85 MIL/uL (ref 3.87–5.11)
RDW: 12.5 % (ref 11.5–15.5)
WBC: 18.7 10*3/uL — ABNORMAL HIGH (ref 4.0–10.5)
nRBC: 0.1 % (ref 0.0–0.2)

## 2022-01-18 LAB — CBC
HCT: 43.4 % (ref 36.0–46.0)
HCT: 43.7 % (ref 36.0–46.0)
Hemoglobin: 14.7 g/dL (ref 12.0–15.0)
Hemoglobin: 14.9 g/dL (ref 12.0–15.0)
MCH: 32.6 pg (ref 26.0–34.0)
MCH: 32.5 pg (ref 26.0–34.0)
MCHC: 33.9 g/dL (ref 30.0–36.0)
MCHC: 34.1 g/dL (ref 30.0–36.0)
MCV: 96.2 fL (ref 80.0–100.0)
MCV: 95.4 fL (ref 80.0–100.0)
Platelets: 124 10*3/uL — ABNORMAL LOW (ref 150–400)
Platelets: 132 10*3/uL — ABNORMAL LOW (ref 150–400)
RBC: 4.51 MIL/uL (ref 3.87–5.11)
RBC: 4.58 MIL/uL (ref 3.87–5.11)
RDW: 12.7 % (ref 11.5–15.5)
RDW: 12.8 % (ref 11.5–15.5)
WBC: 17.8 10*3/uL — ABNORMAL HIGH (ref 4.0–10.5)
WBC: 18.6 10*3/uL — ABNORMAL HIGH (ref 4.0–10.5)
nRBC: 0 % (ref 0.0–0.2)
nRBC: 0 % (ref 0.0–0.2)

## 2022-01-18 LAB — URINALYSIS, ROUTINE W REFLEX MICROSCOPIC
Bacteria, UA: NONE SEEN
Bilirubin Urine: NEGATIVE
Glucose, UA: >=500 mg/dL — AB
Hgb urine dipstick: NEGATIVE
Ketones, ur: NEGATIVE mg/dL
Leukocytes,Ua: NEGATIVE
Nitrite: NEGATIVE
Protein, ur: NEGATIVE mg/dL
Specific Gravity, Urine: 1.02 (ref 1.005–1.030)
pH: 5 (ref 5.0–8.0)

## 2022-01-18 LAB — LACTIC ACID, PLASMA
Lactic Acid, Venous: 2.1 mmol/L (ref 0.5–1.9)
Lactic Acid, Venous: 1.7 mmol/L (ref 0.5–1.9)

## 2022-01-18 LAB — PROCALCITONIN: Procalcitonin: <0.1 ng/mL

## 2022-01-18 LAB — GLUCOSE, CAPILLARY: Glucose-Capillary: 489 mg/dL — ABNORMAL HIGH (ref 70–99)

## 2022-01-18 LAB — CBG MONITORING, ED: Glucose-Capillary: 481 mg/dL — ABNORMAL HIGH (ref 70–99)

## 2022-01-18 LAB — TSH: TSH: <0.01 u[IU]/mL — ABNORMAL LOW (ref 0.350–4.500)

## 2022-01-18 MED ORDER — DEXAMETHASONE 2 MG PO TABS: 2.0000 mg | ORAL_TABLET | Freq: Two times a day (BID) | ORAL | 0 refills | Status: DC

## 2022-01-18 MED ORDER — SODIUM CHLORIDE 0.9 % IV SOLN
1.0000 g | Freq: Once | INTRAVENOUS | Status: AC
  Administered 2022-01-18: 1 g via INTRAVENOUS
  Filled 2022-01-18: qty 10

## 2022-01-18 MED ORDER — INSULIN DETEMIR 100 UNIT/ML ~~LOC~~ SOLN
10.0000 [IU] | Freq: Every day | SUBCUTANEOUS | Status: DC
  Administered 2022-01-18 – 2022-01-20 (×3): 10 [IU] via SUBCUTANEOUS
  Filled 2022-01-18 (×4): qty 0.1

## 2022-01-18 MED ORDER — ENOXAPARIN SODIUM 40 MG/0.4ML IJ SOSY
40.0000 mg | PREFILLED_SYRINGE | INTRAMUSCULAR | Status: DC
  Administered 2022-01-18 – 2022-01-20 (×3): 40 mg via SUBCUTANEOUS
  Filled 2022-01-18 (×3): qty 0.4

## 2022-01-18 MED ORDER — INSULIN ASPART 100 UNIT/ML IJ SOLN
0.0000 [IU] | Freq: Three times a day (TID) | INTRAMUSCULAR | Status: DC
  Administered 2022-01-18: 9 [IU] via SUBCUTANEOUS
  Administered 2022-01-19: 2 [IU] via SUBCUTANEOUS
  Administered 2022-01-19: 9 [IU] via SUBCUTANEOUS
  Filled 2022-01-18 (×3): qty 1

## 2022-01-18 MED ORDER — LEVOTHYROXINE SODIUM 150 MCG PO TABS
150.0000 ug | ORAL_TABLET | Freq: Every day | ORAL | Status: DC
  Administered 2022-01-19: 150 ug via ORAL
  Filled 2022-01-18: qty 1

## 2022-01-18 MED ORDER — INSULIN ASPART 100 UNIT/ML IJ SOLN
12.0000 [IU] | Freq: Once | INTRAMUSCULAR | Status: AC
  Administered 2022-01-18: 12 [IU] via SUBCUTANEOUS
  Filled 2022-01-18: qty 1

## 2022-01-18 MED ORDER — ADULT MULTIVITAMIN W/MINERALS CH
1.0000 | ORAL_TABLET | Freq: Every day | ORAL | Status: DC
  Administered 2022-01-19 – 2022-01-21 (×3): 1 via ORAL
  Filled 2022-01-18 (×3): qty 1

## 2022-01-18 MED ORDER — INSULIN ASPART 100 UNIT/ML IJ SOLN: 0.0000 [IU] | Freq: Three times a day (TID) | INTRAMUSCULAR | Status: DC

## 2022-01-18 MED ORDER — SODIUM CHLORIDE 0.9 % IV BOLUS
1000.0000 mL | Freq: Once | INTRAVENOUS | Status: AC
  Administered 2022-01-18: 1000 mL via INTRAVENOUS

## 2022-01-18 MED ORDER — ACETAMINOPHEN 650 MG RE SUPP: 650.0000 mg | Freq: Four times a day (QID) | RECTAL | Status: DC | PRN

## 2022-01-18 MED ORDER — ONDANSETRON HCL 4 MG PO TABS: 4.0000 mg | ORAL_TABLET | Freq: Four times a day (QID) | ORAL | Status: DC | PRN

## 2022-01-18 MED ORDER — INSULIN ASPART 100 UNIT/ML IJ SOLN: 0.0000 [IU] | Freq: Every day | INTRAMUSCULAR | Status: DC

## 2022-01-18 MED ORDER — SODIUM CHLORIDE 0.9 % IV SOLN: 1.0000 g | INTRAVENOUS | Status: DC

## 2022-01-18 MED ORDER — SODIUM CHLORIDE 0.45 % IV SOLN
INTRAVENOUS | Status: AC
  Administered 2022-01-18: 1000 mL via INTRAVENOUS

## 2022-01-18 MED ORDER — ACETAMINOPHEN 325 MG PO TABS: 650.0000 mg | ORAL_TABLET | Freq: Four times a day (QID) | ORAL | Status: DC | PRN

## 2022-01-18 MED ORDER — ONDANSETRON HCL 4 MG/2ML IJ SOLN: 4.0000 mg | Freq: Four times a day (QID) | INTRAMUSCULAR | Status: DC | PRN

## 2022-01-18 MED ORDER — SODIUM CHLORIDE 0.9 % IV SOLN
1.0000 g | INTRAVENOUS | Status: DC
  Administered 2022-01-19: 1 g via INTRAVENOUS
  Filled 2022-01-18 (×2): qty 10

## 2022-01-18 MED ORDER — DEXAMETHASONE 4 MG PO TABS
4.0000 mg | ORAL_TABLET | Freq: Three times a day (TID) | ORAL | Status: DC
  Administered 2022-01-18 – 2022-01-19 (×2): 4 mg via ORAL
  Filled 2022-01-18 (×3): qty 1

## 2022-01-18 MED ORDER — FOLIC ACID 1 MG PO TABS
1.0000 mg | ORAL_TABLET | Freq: Every day | ORAL | Status: DC
  Administered 2022-01-19 – 2022-01-21 (×3): 1 mg via ORAL
  Filled 2022-01-18 (×3): qty 1

## 2022-01-18 NOTE — ED Notes (Signed)
Previous RN sent message to inpatient inquiring about admission 27 mins ago, inpatient has yet to accept pt. Delay in inpatient admission at this time. Charge RN [place] notified and added to chat ?

## 2022-01-18 NOTE — ED Notes (Signed)
Pt to ED with husband for abnormal labs, sent from oncologist, who states BG is high from the steroids she has been on for brain cancer and that potassium is abnormal. Sent here to have labs normalized. EDP at bedside. Pt has no other complaints. ?

## 2022-01-18 NOTE — ED Notes (Signed)
Informed RN bed assigned 

## 2022-01-18 NOTE — ED Notes (Signed)
Per Dr Corky Downs, draw labs after IV bolus. Called lab, they state it may be okay to draw 1 set cultures off port, that this is provider dependent. Will check with EDP. ?

## 2022-01-18 NOTE — ED Notes (Signed)
Helped pt walk to hall bathroom to void. EDP at bedside discussing discharge. ?

## 2022-01-18 NOTE — Assessment & Plan Note (Addendum)
#  Stage IV-lung cancer-adenocarcinoma- currently on Alimta-Keytruda maintenance; MAY 2023- CT CAP-  Interval increase in size of large spiculated right upper lobe ?lung mass since prior CT concerning for disease progression. Interim development of multiple bilateral lung nodules, also concerning for progression of disease. Increased left axillary adenopathy also concerning for progression of disease. ? ?#Hold chemotherapy today given acute issues see below. ? ?# Subcentimeter brain lesions-clinically asymptomatic. S/p SRS- finished on DEC 23rd, 2022-April 24th MRI Brain [right-sided focal weakness-ER]-interval increase in the size of the previously noted left parietal metastatic lesion, with significantly increased surroundingedema, which results in mass effect on the left lateral ventricle ?and 5 mm of left-to-right midline shift.-Currently awaiting SBRT with Dr. Donella Stade. ? ?# Iatrogenic Hypothyrodism [TSH 86 in [place]- on sythroid 150 mcg/day; FEB 2023- 23;recent TSH-Low; will need adjustment of synthroid. ? ?#Electrolyte imbalance hyponatremia sodium 127/hyperkalemia potassium 5.3/blood sugars 461-contributing to patient's generalized weakness falls.  Recommend hydration/titration of steroids/blood sugar control.  Given multiple medical issues would recommend evaluation in the emergency room. ?  ?# COPD stable-significant provement s/p/bronchodilators/diuretics- STABLE ? ?# Port malfunction: STABLE.  ? ? *B12 injection- ? ?2/16-B12; ?# DISPOSITION: ?# HOLD chemo today; ?# please take pt to ER ?# follow up TBD-Dr.B ? ? ? ?

## 2022-01-18 NOTE — H&P (Incomplete Revision)
?History and Physical  ? ? ?TRINIDAD INGLE HEN:[id number] DOB: February 19, 1961 DOA: 01/18/2022 ? ?PCP: [facility]  ?Patient coming from: oncology clinic ? ?I have personally briefly reviewed patient's old medical records in [place] ? ?Chief Complaint: generalized weakness , abnormal electrolytes ? ?HPI: Nichole Park is a 61 y.o. female with medical history significant of metastatic lung ca on chemo with progressive disease noted on most recent CT scan, Iatrogenic hypothyroidism, COPD, who presented to oncology clinic for routine follow up and was found to have complaints of generalized weakness from baseline with labs noting NA fo 127, potassium of 5.3, and blood glucose of 461 on steroids.Due to multiple lab abnormalities and generalized weakness patient was referred to ED. Per patient and family member who gives most of history. Patient has over the last week to 3 days noted to have decrease energy, increase fatigue,generalized weakness and some confusion. He also noted increase urinary frequency. Patient noted during this time although she generally felt poorly she had a good appetite She also denied any associated symptoms of  abdominal pain, HA, chest pain , sob , fever or chills. Today however ,her generalized weakness progressed and she was unable to ambulate to care on her own with an episode of near fall.  ? ? ?ED Course:  ?In ED ?Vitals: temp 97.3, bp 147/102, hr 106 rr 16 sat  ?Wbc:18.7(chronic elevation), hgb 16. ?Na:127 repeat 132,K 5.3 now4.7, glu 461 now 404 :changes s/p ivfs. Cr 0.82, ast 65/alt 179 (previously elevated) ?TIR:[id number]: ?Stable right upper lobe mass is noted.  Left lung is clear. ?  ?CT head:  ?1. Overall definite interval improvement in the amount of tumoral ?edema in the left parietal white matter. The actual metastatic focus ?is difficult to identify this study without contrast and is better ?seen on the MRI. ?2. No new areas of edema to suggest other metastatic  lesions. ?Tx 1L ns ?Lactic 2.1 ? ?Review of Systems: As per HPI otherwise 10 point review of systems negative.  ? ?Past Medical History:  ?Diagnosis Date  ? Graves disease   ? Malignant pericardial effusion   ? Paroxysmal atrial fibrillation (HCC)   ? Primary lung adenocarcinoma ([place])   ? ? ?Past Surgical History:  ?Procedure Laterality Date  ? IR CV LINE INJECTION  06/19/2021  ? IR IMAGING GUIDED PORT INSERTION  04/27/2021  ? PERICARDIOCENTESIS N/A 03/27/2021  ? Procedure: PERICARDIOCENTESIS;  Surgeon: Nelva Bush, MD;  Location: [facility];  Service: Cardiovascular;  Laterality: N/A;  ? TUBAL LIGATION    ? ? ? reports that she has quit smoking. Her smoking use included cigarettes. She has a 40.00 pack-year smoking history. She has never used smokeless tobacco. She reports that she does not currently use drugs after having used the following drugs: Marijuana. She reports that she does not drink alcohol. ? ?Allergies  ?Allergen Reactions  ? Diazepam Other (See Comments)  ?  Very high emotionally and very low emotionally, hyperventilate and passes out ?Very high emotionally and very low emotionally, hyperventilate and passes out ?  ? Keytruda [Pembrolizumab] Rash  ? Metoprolol   ? Tapazole [Methimazole]   ? ? ?Family History  ?Problem Relation Age of Onset  ? Peripheral Artery Disease Mother   ? Breast cancer Neg Hx   ? ? ?Prior to Admission medications   ?Medication Sig Start Date End Date Taking? Authorizing Provider  ?bismuth subsalicylate (PEPTO BISMOL) 262 MG/15ML suspension Take 30 mLs by mouth every  6 (six) hours as needed.    [provider]  ?dexamethasone (DECADRON) 2 MG tablet Take 1 tablet (2 mg total) by mouth 2 (two) times daily. 01/18/22   Cammie Sickle, MD  ?diltiazem (CARDIZEM CD) 180 MG 24 hr capsule Take 1 capsule (180 mg total) by mouth daily. 11/28/21 02/26/22  Rise Mu, PA-C  ?folic acid (FOLVITE) 1 MG tablet Take 1 tablet (1 mg total) by mouth once daily. 10/15/21    Cammie Sickle, MD  ?levothyroxine (SYNTHROID) 150 MCG tablet Take 1 tablet (150 mcg total) by mouth daily. 01/03/22 04/03/22  Sharen Hones, MD  ?meclizine (ANTIVERT) 25 MG tablet 1 pill at night as needed; if dizziness not improved can take one pill every 8 hours as needed/tolerated. ?Patient not taking: Reported on 12/31/2021 12/28/21   Cammie Sickle, MD  ?Multiple Vitamin (MULTI-VITAMINS) TABS Take 1 tablet by mouth daily.    [provider]  ?ondansetron (ZOFRAN) 4 MG tablet Take 2 tablets (8mg  total) by mouth once every 8 hours as needed for nausea and vomiting. 04/16/21   Cammie Sickle, MD  ?TURMERIC PO Take 1 tablet by mouth daily.    [provider]  ? ? ?Physical Exam: ?Vitals:  ? 01/18/22 1200 01/18/22 1300 01/18/22 1330 01/18/22 1430  ?BP: (!) 139/92 (!) 156/102 (!) 137/93 (!) 154/98  ?Pulse: 85 86 85 93  ?Resp: 16 18 18 16   ?Temp:      ?TempSrc:      ?SpO2: 98% 98% 96% 97%  ?Weight:      ?Height:      ? ? ?Vitals:  ? 01/18/22 1200 01/18/22 1300 01/18/22 1330 01/18/22 1430  ?BP: (!) 139/92 (!) 156/102 (!) 137/93 (!) 154/98  ?Pulse: 85 86 85 93  ?Resp: 16 18 18 16   ?Temp:      ?TempSrc:      ?SpO2: 98% 98% 96% 97%  ?Weight:      ?Height:      ? ?Constitutional: NAD, calm, comfortable ?Eyes: PERRL, lids and conjunctivae normal ?ENMT: Mucous membranes are moist. Posterior pharynx clear of any exudate or lesions.Normal dentition.  ?Neck: normal, supple, no masses, no thyromegaly ?Respiratory: clear to auscultation bilaterally, no wheezing, no crackles. Normal respiratory effort. No accessory muscle use.  ?Cardiovascular: Regular rate and rhythm, no murmurs / rubs / gallops. trace extremity edema. 2+ pedal pulses.  ?Abdomen: no tenderness, no masses palpated. No hepatosplenomegaly. Bowel sounds positive.  ?Musculoskeletal: no clubbing / cyanosis. No joint deformity upper and lower extremities. Good ROM, no contractures. Normal muscle tone.  ?Skin: no rashes, lesions, ulcers.  No induration ?Neurologic: CN 2-12 grossly intact. Sensation intact,  Strength 5/5 in all except right lower ext 4/5(chronic issue).  ?Psychiatric: Normal judgment and insight. Alert and oriented x 3. Normal mood.  ? ? ?Labs on Admission: I have personally reviewed following labs and imaging studies ? ?CBC: ?Recent Labs  ?Lab 01/18/22 ?0830 01/18/22 ?1136  ?WBC 18.7* 17.8*  ?NEUTROABS 16.5*  --   ?HGB 16.1* 14.7  ?HCT 46.1* 43.4  ?MCV 95.1 96.2  ?PLT 152 124*  ? ?Basic Metabolic Panel: ?Recent Labs  ?Lab 01/18/22 ?0830 01/18/22 ?1136  ?NA 127* 132*  ?K 5.3* 4.7  ?CL 94* 100  ?CO2 22 23  ?GLUCOSE 461* 404*  ?BUN 40* 40*  ?CREATININE 0.82 0.68  ?CALCIUM 9.0 8.6*  ? ?GFR: ?Estimated Creatinine Clearance: 63.3 mL/min (by C-G formula based on SCr of 0.68 mg/dL). ?Liver Function Tests: ?Recent Labs  ?  Lab 01/18/22 ?0830 01/18/22 ?1136  ?AST 65* 50*  ?ALT 179* 161*  ?ALKPHOS 86 75  ?BILITOT 1.2 1.4*  ?PROT 6.8 6.2*  ?ALBUMIN 3.4* 3.1*  ? ?No results for input(s): LIPASE, AMYLASE in the last 168 hours. ?No results for input(s): AMMONIA in the last 168 hours. ?Coagulation Profile: ?No results for input(s): INR, PROTIME in the last 168 hours. ?Cardiac Enzymes: ?No results for input(s): CKTOTAL, CKMB, CKMBINDEX, TROPONINI in the last 168 hours. ?BNP (last 3 results) ?No results for input(s): PROBNP in the last 8760 hours. ?HbA1C: ?No results for input(s): HGBA1C in the last 72 hours. ?CBG: ?Recent Labs  ?Lab 01/18/22 ?1012  ?GLUCAP 481*  ? ?Lipid Profile: ?No results for input(s): CHOL, HDL, LDLCALC, TRIG, CHOLHDL, LDLDIRECT in the last 72 hours. ?Thyroid Function Tests: ?No results for input(s): TSH, T4TOTAL, FREET4, T3FREE, THYROIDAB in the last 72 hours. ?Anemia Panel: ?No results for input(s): VITAMINB12, FOLATE, FERRITIN, TIBC, IRON, RETICCTPCT in the last 72 hours. ?Urine analysis: ?   ?Component Value Date/Time  ? COLORURINE YELLOW (A) 01/18/2022 1136  ? APPEARANCEUR CLEAR (A) 01/18/2022 1136  ? APPEARANCEUR Clear  12/01/2013 1439  ? LABSPEC 1.020 01/18/2022 1136  ? LABSPEC 1.005 12/01/2013 1439  ? PHURINE 5.0 01/18/2022 1136  ? GLUCOSEU >=500 (A) 01/18/2022 1136  ? GLUCOSEU Negative 12/01/2013 1439  ? HGBUR NEGATIV

## 2022-01-18 NOTE — ED Provider Notes (Signed)
? ?[facility] ?Provider Note ? ? ? Event Date/Time  ? First MD Initiated Contact with Patient 01/18/22 1101   ?  (approximate) ? ? ?History  ? ?abnormal labs ? ? ?HPI ? ?Nichole Park is a 61 y.o. female with a history of Graves' disease, lung cancer, metastatic, COPD sent from the cancer center today for evaluation of abnormal lab work.  Patient reportedly was scheduled for chemotherapy.  Sent here because of elevated glucose abnormal sodium and generalized fatigue.  Patient has been on steroids recently reportedly. ? ?No fevers reported. ?  ? ? ?Physical Exam  ? ?Triage Vital Signs: ?ED Triage Vitals  ?Enc Vitals Group  ?   BP 01/18/22 1010 (!) 157/115  ?   Pulse Rate 01/18/22 1010 (!) 115  ?   Resp 01/18/22 1010 20  ?   Temp 01/18/22 1010 98.2 ?F (36.8 ?C)  ?   Temp Source 01/18/22 1010 Oral  ?   SpO2 01/18/22 1010 97 %  ?   Weight 01/18/22 1011 59 kg (130 lb)  ?   Height 01/18/22 1011 1.549 m (5\' 1" )  ?   Head Circumference --   ?   Peak Flow --   ?   Pain Score 01/18/22 1011 0  ?   Pain Loc --   ?   Pain Edu? --   ?   Excl. in [place]? --   ? ? ?Most recent vital signs: ?Vitals:  ? 01/18/22 1300 01/18/22 1330  ?BP: (!) 156/102 (!) 137/93  ?Pulse: 86 85  ?Resp: 18 18  ?Temp:    ?SpO2: 98% 96%  ? ? ? ?General: Awake, no distress.  ?CV:  Good peripheral perfusion.  ?Resp:  Normal effort.  ?Abd:  No distention.  ?Other:   ? ? ?ED Results / Procedures / Treatments  ? ?Labs ?(all labs ordered are listed, but only abnormal results are displayed) ?Labs Reviewed  ?CBC - Abnormal; Notable for the following components:  ?    Result Value  ? WBC 17.8 (*)   ? Platelets 124 (*)   ? All other components within normal limits  ?COMPREHENSIVE METABOLIC PANEL - Abnormal; Notable for the following components:  ? Sodium 132 (*)   ? Glucose, Bld 404 (*)   ? BUN 40 (*)   ? Calcium 8.6 (*)   ? Total Protein 6.2 (*)   ? Albumin 3.1 (*)   ? AST 50 (*)   ? ALT 161 (*)   ? Total Bilirubin 1.4 (*)   ? All other components  within normal limits  ?LACTIC ACID, PLASMA - Abnormal; Notable for the following components:  ? Lactic Acid, Venous 2.1 (*)   ? All other components within normal limits  ?URINALYSIS, ROUTINE W REFLEX MICROSCOPIC - Abnormal; Notable for the following components:  ? Color, Urine YELLOW (*)   ? APPearance CLEAR (*)   ? Glucose, UA >=500 (*)   ? All other components within normal limits  ?CBG MONITORING, ED - Abnormal; Notable for the following components:  ? Glucose-Capillary 481 (*)   ? All other components within normal limits  ?CULTURE, BLOOD (SINGLE)  ?URINE CULTURE  ?LACTIC ACID, PLASMA  ? ? ? ?EKG ? ? ? ? ?RADIOLOGY ?Chest x-ray interpreted by me, lung mass noted, no acute changes ? ? ? ?PROCEDURES: ? ?Critical Care performed:  ? ?Procedures ? ? ?MEDICATIONS ORDERED IN ED: ?Medications  ?sodium chloride 0.9 % bolus 1,000 mL (0 mLs Intravenous  Stopped 01/18/22 1242)  ?cefTRIAXone (ROCEPHIN) 1 g in sodium chloride 0.9 % 100 mL IVPB (0 g Intravenous Stopped 01/18/22 1345)  ? ? ? ?IMPRESSION / MDM / ASSESSMENT AND PLAN / ED COURSE  ?I reviewed the triage vital signs and the nursing notes. ? ?Patient presents with fatigue generalized weakness, increased confusion per spouse. ? ?Presentation represents acute illness or injury that poses a threat to life or bodily function potentially ? ?Differential is extensive, includes hyperglycemia, DKA, infection, sepsis, electrolyte abnormality ? ?We will give IV fluids for elevated glucose and then recheck labs ? ?Lab work is notable for glucose of 400, normal anion gap, normal CO2, mild hyponatremia likely related to increased glucose ? ?Lactic acid is elevated at 2.1, white blood cell count of 17.8 however the patient has been on steroids, will start on IV Rocephin as there are reports of frequent urination and concern for UTI ? ?Urinalysis is overall reassuring ? ?CT head obtained because of possibility of metastatic CA, demonstrates improvement ? ?We will admit to the  hospital service given increased weakness, difficulty ambulating, hyperglycemia, hyponatremia fatigue elevated lactic ? ? ? ?  ? ? ?FINAL CLINICAL IMPRESSION(S) / ED DIAGNOSES  ? ?Final diagnoses:  ?Weakness  ?Malignant neoplasm of lung, unspecified laterality, unspecified part of lung ([place])  ?Hyperglycemia  ? ? ? ?Rx / DC Orders  ? ?ED Discharge Orders   ? ? None  ? ?  ? ? ? ?Note:  This document was prepared using Dragon voice recognition software and may include unintentional dictation errors. ?  ?Lavonia Drafts, MD ?01/18/22 1458 ? ?

## 2022-01-18 NOTE — Progress Notes (Signed)
[facility] ?CONSULT NOTE ? ?Patient Care Team: ?[place] as PCP - General ?End, Harrell Gave, MD as PCP - Cardiology (Cardiology) ?Telford Nab, RN as Sales executive ? ?CHIEF COMPLAINTS/PURPOSE OF CONSULTATION: Lung cancer ? ?#  ?Oncology History Overview Note  ?# AUG [id number] IV adenocarcinoma of the lung [s/p pericardial effusion/tamponade]. MRI Brain AUG 16th, 2022- IMPRESSION: ?Multiple peripherally enhancing, centrally T1 hypointense lesions, ?with surrounding T2 hyperintense signal in the bilateral cerebral ?hemispheres, concerning for metastatic disease. ?  ? ?# AUG 17th 2022- CARBO-ALIMTA-Keytruda #1 ? ?# Mammogram- FEB 2023-no lesions noted in the breasts; 1 to 2 cm lymph node left underarm;. LYMPH NODE, LEFT AXILLARY; ULTRASOUND-GUIDED CORE NEEDLE BIOPSY:  ?- POSITIVE FOR MALIGNANCY.  ?- METASTATIC ADENOCARCINOMA, COMPATIBLE WITH PULMONARY PRIMARY ? ? # Repeat MRI brain- 11/22 Mildly increased size of a left parietal metastasis with increased, mild edema. Decreased size of 2 other metastases. S/p SRS- finished on DEC 23rd, 2022- MRI FEB 2023-improved brain lesions ? ?# JAN 2023-iatrogenic hypothyroidism- TSH 83; synthroid 14mg ? ?#Acute respiratory failure BiPAP/ICU.  S/p pericardiocentesis. ? ?NGS; PDL-1-0 ? ?  ?Malignant pericardial effusion  ?03/28/2021 Initial Diagnosis  ? Malignant pericardial effusion (HCC) ? ?  ?04/25/2021 -  Chemotherapy  ? Patient is on Treatment Plan : LUNG CARBOplatin / Pemetrexed / Pembrolizumab q21d Induction x 4 cycles / Maintenance Pemetrexed + Pembrolizumab  ? ?   ?Can[place] of [place] lobe of right lung ([place]  ?04/10/2021 Initial Diagnosis  ? [place] of right lung [place] ? ?  ?04/10/2021 Cancer Staging  ? Staging form: Lung, AJCC 8th Edition ?- Clinical: Stage IVA (cT2, cN2, cM1a) - Signed by BCammie Sickle MD on 04/10/2021 ? ?  ? ?HISTORY OF PRESENTING ILLNESS: Accompanied by family; patient currently in  wheelchair. ? ?Nichole MORAGNE89y.o.  female  stage IV adenocarcinoma the lung cancer is here currently on chemotherapy-immunotherapy is here for a follow up-reviewed results of the MRI brain/CT scan chest and pelvis.  ? ?Patient was recently in the hospital for focal upper lower extremity weakness.  Patient status post MRI showed recurrent progressive brain lesions.  Evaluated by radiation oncology.  Patient is currently awaiting starting radiation. ? ?Patient continues to complain of weakness.  Complains of falls.  No seizures.  Feels extremely fatigued.  Positive for nausea but no headaches. ? ?Review of Systems  ?Constitutional:  Positive for malaise/fatigue. Negative for chills, diaphoresis and fever.  ?HENT:  Negative for nosebleeds and sore throat.   ?Eyes:  Negative for double vision.  ?Respiratory:  Positive for shortness of breath. Negative for cough, hemoptysis, sputum production and wheezing.   ?Cardiovascular:  Negative for chest pain, palpitations, orthopnea and leg swelling.  ?Gastrointestinal:  Negative for abdominal pain, blood in stool, constipation, diarrhea, heartburn, melena, nausea and vomiting.  ?Genitourinary:  Negative for dysuria, frequency and urgency.  ?Musculoskeletal:  Positive for myalgias. Negative for back pain and joint pain.  ?Skin: Negative.  Negative for itching and rash.  ?Neurological:  Negative for dizziness, tingling, focal weakness, weakness and headaches.  ?Endo/Heme/Allergies:  Does not bruise/bleed easily.  ?Psychiatric/Behavioral:  Negative for depression. The patient is not nervous/anxious and does not have insomnia.    ? ?MEDICAL HISTORY:  ?Past Medical History:  ?Diagnosis Date  ? Graves disease   ? Malignant pericardial effusion   ? Paroxysmal atrial fibrillation (HCC)   ? [place] lung adenocarcinoma ([place]   ? ? ?SURGICAL HISTORY: ?Past Surgical History:  ?Procedure  Laterality Date  ? IR CV LINE INJECTION  06/19/2021  ? IR IMAGING GUIDED PORT INSERTION  04/27/2021  ?  PERICARDIOCENTESIS N/A 03/27/2021  ? Procedure: PERICARDIOCENTESIS;  Surgeon: Nelva Bush, MD;  Location: [facility];  Service: Cardiovascular;  Laterality: N/A;  ? TUBAL LIGATION    ? ? ?SOCIAL HISTORY: ?Social History  ? ?Socioeconomic History  ? Marital status: Widowed  ?  Spouse name: Not on file  ? Number of children: Not on file  ? Years of education: Not on file  ? Highest education level: Not on file  ?Occupational History  ? Not on file  ?Tobacco Use  ? Smoking status: Former  ?  Packs/day: 1.00  ?  Years: 40.00  ?  Pack years: 40.00  ?  Types: Cigarettes  ? Smokeless tobacco: Never  ?Vaping Use  ? Vaping Use: Never used  ?Substance and Sexual Activity  ? Alcohol use: No  ? Drug use: Not Currently  ?  Types: Marijuana  ? Sexual activity: Not Currently  ?Other Topics Concern  ? Not on file  ?Social History Narrative  ? Not on file  ? ?Social Determinants of Health  ? ?Financial Resource Strain: High Risk  ? Difficulty of Paying Living Expenses: Very hard  ?Food Insecurity: Not on file  ?Transportation Needs: Not on file  ?Physical Activity: Not on file  ?Stress: Stress Concern Present  ? Feeling of Stress : Very much  ?Social Connections: Not on file  ?Intimate Partner Violence: Not on file  ? ? ?FAMILY HISTORY: ?Family History  ?Problem Relation Age of Onset  ? Peripheral Artery Disease Mother   ? Breast cancer Neg Hx   ? ? ?ALLERGIES:  is allergic to diazepam, keytruda [pembrolizumab], metoprolol, and tapazole [methimazole]. ? ?MEDICATIONS:  ?Current Outpatient Medications  ?Medication Sig Dispense Refill  ? bismuth subsalicylate (PEPTO BISMOL) 262 MG/15ML suspension Take 30 mLs by mouth every 6 (six) hours as needed.    ? diltiazem (CARDIZEM CD) 180 MG 24 hr capsule Take 1 capsule (180 mg total) by mouth daily. 90 capsule 3  ? folic acid (FOLVITE) 1 MG tablet Take 1 tablet (1 mg total) by mouth once daily. 90 tablet 1  ? levothyroxine (SYNTHROID) 150 MCG tablet Take 1 tablet (150 mcg total)  by mouth daily. 90 tablet 0  ? Multiple Vitamin (MULTI-VITAMINS) TABS Take 1 tablet by mouth daily.    ? ondansetron (ZOFRAN) 4 MG tablet Take 2 tablets (48m total) by mouth once every 8 hours as needed for nausea and vomiting. 80 tablet 1  ? TURMERIC PO Take 1 tablet by mouth daily.    ? dexamethasone (DECADRON) 2 MG tablet Take 1 tablet (2 mg total) by mouth 2 (two) times daily. 45 tablet 0  ? meclizine (ANTIVERT) 25 MG tablet 1 pill at night as needed; if dizziness not improved can take one pill every 8 hours as needed/tolerated. (Patient not taking: Reported on 12/31/2021) 30 tablet 0  ? ?No current facility-administered medications for this visit.  ? ? ?Left axillary lymphadenopathy noted ~1 to 2 cm in size ? ? ?PHYSICAL EXAMINATION: ?ECOG PERFORMANCE STATUS: 1 - Symptomatic but completely ambulatory ? ?Vitals:  ? 01/18/22 0800  ?BP: (!) 147/102  ?Pulse: (!) 106  ?Resp: 16  ?Temp: (!) 97.3 ?F (36.3 ?C)  ? ?Filed Weights  ? 01/18/22 0800  ?Weight: 129 lb 8 oz (58.7 kg)  ? ? ?Physical Exam ?Vitals and nursing note reviewed.  ?HENT:  ?  Head: Normocephalic and atraumatic.  ?   Mouth/Throat:  ?   Pharynx: Oropharynx is clear.  ?Eyes:  ?   Extraocular Movements: Extraocular movements intact.  ?   Pupils: Pupils are equal, round, and reactive to light.  ?Cardiovascular:  ?   Rate and Rhythm: Normal rate and regular rhythm.  ?Pulmonary:  ?   Comments: Decreased breath sounds bilaterally.  ?Abdominal:  ?   Palpations: Abdomen is soft.  ?Musculoskeletal:     ?   General: Normal range of motion.  ?   Cervical back: Normal range of motion.  ?Skin: ?   General: Skin is warm.  ?Neurological:  ?   General: No focal deficit present.  ?   Mental Status: She is alert and oriented to person, place, and time.  ?Psychiatric:     ?   Behavior: Behavior normal.     ?   Judgment: Judgment normal.  ? ? ? ?LABORATORY DATA:  ?I have reviewed the data as listed ?Lab Results  ?Component Value Date  ? WBC 18.7 (H) 01/18/2022  ? HGB 16.1  (H) 01/18/2022  ? HCT 46.1 (H) 01/18/2022  ? MCV 95.1 01/18/2022  ? PLT 152 01/18/2022  ? ?Recent Labs  ?  03/27/21 ?1635 03/28/21 ?0131 12/31/21 ?1639 01/10/22 ?1322 01/18/22 ?0830  ?NA  --    < > 138 132* 127*  ?K

## 2022-01-18 NOTE — ED Notes (Signed)
EDP at bedside discussing findings and plan of care. ?

## 2022-01-18 NOTE — H&P (Addendum)
History and Physical    Nichole Park:[id number] DOB: [birth year] DOA: 01/18/2022  PCP: [place]  Patient coming from: oncology clinic  I have personally briefly reviewed patient's old medical records in [place]  Chief Complaint: generalized weakness , abnormal electrolytes  HPI: Nichole Park is a 61 y.o. female with medical history significant of metastatic lung ca on chemo with progressive disease noted on most recent CT scan, Iatrogenic hypothyroidism, COPD, who presented to oncology clinic for routine follow up and was found to have complaints of generalized weakness from baseline with labs noting NA fo 127, potassium of 5.3, and blood glucose of 461 on steroids.Due to multiple lab abnormalities and generalized weakness patient was referred to ED. Per patient and family member who gives most of history. Patient has over the last week to 3 days noted to have decrease energy, increase fatigue,generalized weakness and some confusion. He also noted increase urinary frequency. Patient noted during this time although she generally felt poorly she had a good appetite She also denied any associated symptoms of  abdominal pain, HA, chest pain , sob , fever or chills. Today however ,her generalized weakness progressed and she was unable to ambulate to care on her own with an episode of near fall.    ED Course:  In ED Vitals: temp 97.3, bp 147/102, hr 106 rr 16 sat  Wbc:18.7(chronic elevation), hgb 16. Na:127 repeat 132,K 5.3 now4.7, glu 461 now 404 :changes s/p ivfs. Cr 0.82, ast 65/alt 179 (previously elevated) CHE:NIDPOEUMPN: Stable right upper lobe mass is noted.  Left lung is clear.   CT head:  1. Overall definite interval improvement in the amount of tumoral edema in the left parietal white matter. The actual metastatic focus is difficult to identify this study without contrast and is better seen on the MRI. 2. No new areas of edema to suggest other metastatic  lesions. Tx 1L ns Lactic 2.1  Review of Systems: As per HPI otherwise 10 point review of systems negative.   Past Medical History:  Diagnosis Date   Graves disease    Malignant pericardial effusion    Paroxysmal atrial fibrillation ([place])    Primary lung adenocarcinoma [facility])     Past Surgical History:  Procedure Laterality Date   IR CV LINE INJECTION  06/19/2021   IR IMAGING GUIDED PORT INSERTION  04/27/2021   PERICARDIOCENTESIS N/A 03/27/2021   Procedure: PERICARDIOCENTESIS;  Surgeon: Nelva Bush, MD;  Location: [facility];  Service: Cardiovascular;  Laterality: N/A;   TUBAL LIGATION       reports that she has quit smoking. Her smoking use included cigarettes. She has a 40.00 pack-year smoking history. She has never used smokeless tobacco. She reports that she does not currently use drugs after having used the following drugs: Marijuana. She reports that she does not drink alcohol.  Allergies  Allergen Reactions   Diazepam Other (See Comments)    Very high emotionally and very low emotionally, hyperventilate and passes out Very high emotionally and very low emotionally, hyperventilate and passes out    Keytruda [Pembrolizumab] Rash   Metoprolol    Tapazole [Methimazole]     Family History  Problem Relation Age of Onset   Peripheral Artery Disease Mother    Breast cancer Neg Hx     Prior to Admission medications   Medication Sig Start Date End Date Taking? Authorizing Provider  bismuth subsalicylate (PEPTO BISMOL) 262 MG/15ML suspension Take 30 mLs by mouth every  6 (six) hours as needed.    [provider]  dexamethasone (DECADRON) 2 MG tablet Take 1 tablet (2 mg total) by mouth 2 (two) times daily. 01/18/22   Cammie Sickle, MD  diltiazem (CARDIZEM CD) 180 MG 24 hr capsule Take 1 capsule (180 mg total) by mouth daily. 11/28/21 02/26/22  Rise Mu, PA-C  folic acid (FOLVITE) 1 MG tablet Take 1 tablet (1 mg total) by mouth once daily. 10/15/21    Cammie Sickle, MD  levothyroxine (SYNTHROID) 150 MCG tablet Take 1 tablet (150 mcg total) by mouth daily. 01/03/22 04/03/22  Sharen Hones, MD  meclizine (ANTIVERT) 25 MG tablet 1 pill at night as needed; if dizziness not improved can take one pill every 8 hours as needed/tolerated. Patient not taking: Reported on 12/31/2021 12/28/21   Cammie Sickle, MD  Multiple Vitamin (MULTI-VITAMINS) TABS Take 1 tablet by mouth daily.    [provider]  ondansetron (ZOFRAN) 4 MG tablet Take 2 tablets (8mg  total) by mouth once every 8 hours as needed for nausea and vomiting. 04/16/21   Cammie Sickle, MD  TURMERIC PO Take 1 tablet by mouth daily.    [provider]    Physical Exam: Vitals:   01/18/22 1200 01/18/22 1300 01/18/22 1330 01/18/22 1430  BP: (!) 139/92 (!) 156/102 (!) 137/93 (!) 154/98  Pulse: 85 86 85 93  Resp: 16 18 18 16   Temp:      TempSrc:      SpO2: 98% 98% 96% 97%  Weight:      Height:        Vitals:   01/18/22 1200 01/18/22 1300 01/18/22 1330 01/18/22 1430  BP: (!) 139/92 (!) 156/102 (!) 137/93 (!) 154/98  Pulse: 85 86 85 93  Resp: 16 18 18 16   Temp:      TempSrc:      SpO2: 98% 98% 96% 97%  Weight:      Height:       Constitutional: NAD, calm, comfortable Eyes: PERRL, lids and conjunctivae normal ENMT: Mucous membranes are moist. Posterior pharynx clear of any exudate or lesions.Normal dentition.  Neck: normal, supple, no masses, no thyromegaly Respiratory: clear to auscultation bilaterally, no wheezing, no crackles. Normal respiratory effort. No accessory muscle use.  Cardiovascular: Regular rate and rhythm, no murmurs / rubs / gallops. trace extremity edema. 2+ pedal pulses.  Abdomen: no tenderness, no masses palpated. No hepatosplenomegaly. Bowel sounds positive.  Musculoskeletal: no clubbing / cyanosis. No joint deformity upper and lower extremities. Good ROM, no contractures. Normal muscle tone.  Skin: no rashes, lesions, ulcers.  No induration Neurologic: CN 2-12 grossly intact. Sensation intact,  Strength 5/5 in all except right lower ext 4/5(chronic issue).  Psychiatric: Normal judgment and insight. Alert and oriented x 3. Normal mood.    Labs on Admission: I have personally reviewed following labs and imaging studies  CBC: Recent Labs  Lab 01/18/22 0830 01/18/22 1136  WBC 18.7* 17.8*  NEUTROABS 16.5*  --   HGB 16.1* 14.7  HCT 46.1* 43.4  MCV 95.1 96.2  PLT 152 443*   Basic Metabolic Panel: Recent Labs  Lab 01/18/22 0830 01/18/22 1136  NA 127* 132*  K 5.3* 4.7  CL 94* 100  CO2 22 23  GLUCOSE 461* 404*  BUN 40* 40*  CREATININE 0.82 0.68  CALCIUM 9.0 8.6*   GFR: Estimated Creatinine Clearance: 63.3 mL/min (by C-G formula based on SCr of 0.68 mg/dL). Liver Function Tests: Recent Labs  Lab 01/18/22 0830 01/18/22 1136  AST 65* 50*  ALT 179* 161*  ALKPHOS 86 75  BILITOT 1.2 1.4*  PROT 6.8 6.2*  ALBUMIN 3.4* 3.1*   No results for input(s): LIPASE, AMYLASE in the last 168 hours. No results for input(s): AMMONIA in the last 168 hours. Coagulation Profile: No results for input(s): INR, PROTIME in the last 168 hours. Cardiac Enzymes: No results for input(s): CKTOTAL, CKMB, CKMBINDEX, TROPONINI in the last 168 hours. BNP (last 3 results) No results for input(s): PROBNP in the last 8760 hours. HbA1C: No results for input(s): HGBA1C in the last 72 hours. CBG: Recent Labs  Lab 01/18/22 1012  GLUCAP 481*   Lipid Profile: No results for input(s): CHOL, HDL, LDLCALC, TRIG, CHOLHDL, LDLDIRECT in the last 72 hours. Thyroid Function Tests: No results for input(s): TSH, T4TOTAL, FREET4, T3FREE, THYROIDAB in the last 72 hours. Anemia Panel: No results for input(s): VITAMINB12, FOLATE, FERRITIN, TIBC, IRON, RETICCTPCT in the last 72 hours. Urine analysis:    Component Value Date/Time   COLORURINE YELLOW (A) 01/18/2022 1136   APPEARANCEUR CLEAR (A) 01/18/2022 1136   APPEARANCEUR Clear  12/01/2013 1439   LABSPEC 1.020 01/18/2022 1136   LABSPEC 1.005 12/01/2013 1439   PHURINE 5.0 01/18/2022 1136   GLUCOSEU >=500 (A) 01/18/2022 1136   GLUCOSEU Negative 12/01/2013 1439   HGBUR NEGATIVE 01/18/2022 1136   BILIRUBINUR NEGATIVE 01/18/2022 1136   BILIRUBINUR Negative 12/01/2013 1439   KETONESUR NEGATIVE 01/18/2022 1136   PROTEINUR NEGATIVE 01/18/2022 1136   NITRITE NEGATIVE 01/18/2022 1136   LEUKOCYTESUR NEGATIVE 01/18/2022 1136   LEUKOCYTESUR 1+ 12/01/2013 1439    Radiological Exams on Admission: CT HEAD WO CONTRAST (5MM)  Result Date: 01/18/2022 CLINICAL DATA:  History of metastatic non-small cell lung cancer. EXAM: CT HEAD WITHOUT CONTRAST TECHNIQUE: Contiguous axial images were obtained from the base of the skull through the vertex without intravenous contrast. RADIATION DOSE REDUCTION: This exam was performed according to the departmental dose-optimization program which includes automated exposure control, adjustment of the mA and/or kV according to patient size and/or use of iterative reconstruction technique. COMPARISON:  Head CT and MRI brain 12/31/2021. FINDINGS: Brain: Overall definite interval improvement in the amount of tumoral edema seen in the left parietal white matter. The actual metastatic focus is difficult to identify this study without contrast and is better seen on the MRI. No new areas of edema to suggest other metastatic lesions. No evidence of acute hemispheric infarction or intracranial hemorrhage. The brainstem and cerebellum are grossly and stable. Vascular: Minimal vascular calcifications are stable. No hyperdense vessels. Skull: No skull lesions. Sinuses/Orbits: The paranasal sinuses and mastoid air cells are clear. The globes are intact. Other: No scalp lesions or scalp hematoma. IMPRESSION: 1. Overall definite interval improvement in the amount of tumoral edema in the left parietal white matter. The actual metastatic focus is difficult to identify this  study without contrast and is better seen on the MRI. 2. No new areas of edema to suggest other metastatic lesions. Electronically Signed   By: Marijo Sanes M.D.   On: 01/18/2022 14:30   DG Chest Port 1 View  Result Date: 01/18/2022 CLINICAL DATA:  Weakness. EXAM: PORTABLE CHEST 1 VIEW COMPARISON:  December 31, 2021. FINDINGS: The heart size and mediastinal contours are within normal limits. Right internal jugular Port-A-Cath is unchanged in position. Left lung is clear. Stable right upper lobe mass is noted. The visualized skeletal structures are unremarkable. IMPRESSION: Stable right upper lobe mass is noted.  Left  lung is clear. Electronically Signed   By: Marijo Conception M.D.   On: 01/18/2022 11:35    EKG: Independently reviewed. N/a  Assessment/Plan  Steriod induced hyperglycemia -start on hyperglycemic protocol  - start low dose lantus  -check a1c last was 5.6  -patient currently on tapering dose of steroid as recommended by oncologist   Failure to thrive  Acute volume depletion due to polyuria Abnormal electrolytes In background of metastatic lung ca ,with brain mets,on chemo/steroids -admit for ivfs and monitoring of electrolytes   -repeat bmp this bmp  Leukocytosis -chronic due to steroids use/ metastatic cancer  - will r/o infection, blood cultures pending  -patient hypothermic on admit ,with lactic acidosis  -start broad spectrum abx due to immune compromise state  -de-escalate abx as able  - UA unremarkable, urine culture pending  -cxr NAD  -monitor temperature curve  -check inflammatory markers     Lung CA IV -followed by oncology     Hypothyroidism -continue synthroid   COPD -no acute issues   DVT prophylaxis:  Heparin Code Status: full Family Communication: family at bedside   Consults called: n/a Admission status: obs  Clance Boll MD [facility]   If 7PM-7AM, please contact night-coverage [email or web address] Password  [facility]  01/18/2022, 3:39 PM

## 2022-01-18 NOTE — ED Triage Notes (Signed)
Pt presents to ED from the cancer center after she had labs drawn and they sent her over due to abnormal labs. Elevated CBG, potassium, with low sodium. Not all labs were not resulted but her MD felt she needed to be evaluated with possible admission instead of receiving chemo today.  ?

## 2022-01-18 NOTE — ED Notes (Signed)
Warm blankets provided and light turned down. ?

## 2022-01-18 NOTE — ED Notes (Signed)
Provided snacks to pt and husband.  ?

## 2022-01-18 NOTE — ED Notes (Signed)
Pt to inpatient unit at this time by transporter  ?

## 2022-01-18 NOTE — ED Notes (Addendum)
This RN called and spoke with staff on 1C, per 1C secretary who communicated with receiving RN okay to transport patient to the floor.  ?

## 2022-01-18 NOTE — ED Notes (Signed)
Lab called, lactic 2.1, EDP informed. ?

## 2022-01-18 NOTE — Progress Notes (Signed)
[facility]) Hospital Liaison note: ? ?This patient is currently enrolled in [facility] outpatient-based Palliative Care. Will continue to follow for disposition. ? ?Please call with any outpatient palliative questions or concerns. ? ?Thank you, ?Lorelee Market, LPN ?[facility] Hospital Liaison ?[phone] ?

## 2022-01-18 NOTE — Progress Notes (Signed)
Patient reports increase in weakness and feel trying to get in the car this morning.  Also feeling confused.  Has enough Dexamethasone for today and tomorrow will need a refill if she is to continue. ? ?Has a 10 lb wt loss since last documented wt 8 days ago with a decrease in appetite and drinks 1 meal replacement shake a day. ? ?Blood pressure in office check today is 156/104 HR 108, 147/102 HR 106. ?

## 2022-01-19 ENCOUNTER — Encounter: Payer: Self-pay | Admitting: Radiology

## 2022-01-19 ENCOUNTER — Inpatient Hospital Stay: Payer: Medicaid Other

## 2022-01-19 DIAGNOSIS — E872 Acidosis, unspecified: Secondary | ICD-10-CM | POA: Diagnosis present

## 2022-01-19 DIAGNOSIS — D72829 Elevated white blood cell count, unspecified: Secondary | ICD-10-CM | POA: Diagnosis present

## 2022-01-19 DIAGNOSIS — C7931 Secondary malignant neoplasm of brain: Secondary | ICD-10-CM | POA: Diagnosis present

## 2022-01-19 DIAGNOSIS — I480 Paroxysmal atrial fibrillation: Secondary | ICD-10-CM | POA: Diagnosis present

## 2022-01-19 DIAGNOSIS — E871 Hypo-osmolality and hyponatremia: Secondary | ICD-10-CM | POA: Diagnosis present

## 2022-01-19 DIAGNOSIS — R627 Adult failure to thrive: Secondary | ICD-10-CM | POA: Diagnosis present

## 2022-01-19 DIAGNOSIS — E0500 Thyrotoxicosis with diffuse goiter without thyrotoxic crisis or storm: Secondary | ICD-10-CM | POA: Diagnosis present

## 2022-01-19 DIAGNOSIS — E878 Other disorders of electrolyte and fluid balance, not elsewhere classified: Secondary | ICD-10-CM | POA: Diagnosis present

## 2022-01-19 DIAGNOSIS — T68XXXA Hypothermia, initial encounter: Secondary | ICD-10-CM | POA: Diagnosis present

## 2022-01-19 DIAGNOSIS — Z9221 Personal history of antineoplastic chemotherapy: Secondary | ICD-10-CM | POA: Diagnosis not present

## 2022-01-19 DIAGNOSIS — I10 Essential (primary) hypertension: Secondary | ICD-10-CM | POA: Diagnosis present

## 2022-01-19 DIAGNOSIS — Z888 Allergy status to other drugs, medicaments and biological substances status: Secondary | ICD-10-CM | POA: Diagnosis not present

## 2022-01-19 DIAGNOSIS — R739 Hyperglycemia, unspecified: Secondary | ICD-10-CM | POA: Diagnosis present

## 2022-01-19 DIAGNOSIS — C3490 Malignant neoplasm of unspecified part of unspecified bronchus or lung: Secondary | ICD-10-CM | POA: Diagnosis present

## 2022-01-19 DIAGNOSIS — R350 Frequency of micturition: Secondary | ICD-10-CM | POA: Diagnosis present

## 2022-01-19 DIAGNOSIS — E860 Dehydration: Secondary | ICD-10-CM | POA: Diagnosis present

## 2022-01-19 DIAGNOSIS — Z87891 Personal history of nicotine dependence: Secondary | ICD-10-CM | POA: Diagnosis not present

## 2022-01-19 DIAGNOSIS — Z79890 Hormone replacement therapy (postmenopausal): Secondary | ICD-10-CM | POA: Diagnosis not present

## 2022-01-19 DIAGNOSIS — E875 Hyperkalemia: Secondary | ICD-10-CM | POA: Diagnosis present

## 2022-01-19 DIAGNOSIS — I3131 Malignant pericardial effusion in diseases classified elsewhere: Secondary | ICD-10-CM | POA: Diagnosis present

## 2022-01-19 DIAGNOSIS — Z79899 Other long term (current) drug therapy: Secondary | ICD-10-CM | POA: Diagnosis not present

## 2022-01-19 DIAGNOSIS — T380X5A Adverse effect of glucocorticoids and synthetic analogues, initial encounter: Secondary | ICD-10-CM | POA: Diagnosis present

## 2022-01-19 DIAGNOSIS — Z8249 Family history of ischemic heart disease and other diseases of the circulatory system: Secondary | ICD-10-CM | POA: Diagnosis not present

## 2022-01-19 DIAGNOSIS — T381X5A Adverse effect of thyroid hormones and substitutes, initial encounter: Secondary | ICD-10-CM | POA: Diagnosis present

## 2022-01-19 LAB — URINE CULTURE: Culture: NO GROWTH

## 2022-01-19 LAB — CBC
HCT: 39.5 % (ref 36.0–46.0)
Hemoglobin: 13.8 g/dL (ref 12.0–15.0)
MCH: 33 pg (ref 26.0–34.0)
MCHC: 34.9 g/dL (ref 30.0–36.0)
MCV: 94.5 fL (ref 80.0–100.0)
Platelets: 115 10*3/uL — ABNORMAL LOW (ref 150–400)
RBC: 4.18 MIL/uL (ref 3.87–5.11)
RDW: 12.4 % (ref 11.5–15.5)
WBC: 19.5 10*3/uL — ABNORMAL HIGH (ref 4.0–10.5)
nRBC: 0 % (ref 0.0–0.2)

## 2022-01-19 LAB — GLUCOSE, CAPILLARY
Glucose-Capillary: 185 mg/dL — ABNORMAL HIGH (ref 70–99)
Glucose-Capillary: 379 mg/dL — ABNORMAL HIGH (ref 70–99)
Glucose-Capillary: 189 mg/dL — ABNORMAL HIGH (ref 70–99)
Glucose-Capillary: 207 mg/dL — ABNORMAL HIGH (ref 70–99)

## 2022-01-19 LAB — T4, FREE: Free T4: 2.31 ng/dL — ABNORMAL HIGH (ref 0.61–1.12)

## 2022-01-19 LAB — BASIC METABOLIC PANEL
Anion gap: 9 (ref 5–15)
BUN: 28 mg/dL — ABNORMAL HIGH (ref 6–20)
CO2: 24 mmol/L (ref 22–32)
Calcium: 8.7 mg/dL — ABNORMAL LOW (ref 8.9–10.3)
Chloride: 99 mmol/L (ref 98–111)
Creatinine, Ser: 0.56 mg/dL (ref 0.44–1.00)
GFR, Estimated: >60 mL/min (ref >60)
Glucose, Bld: 166 mg/dL — ABNORMAL HIGH (ref 70–99)
Potassium: 4.5 mmol/L (ref 3.5–5.1)
Sodium: 132 mmol/L — ABNORMAL LOW (ref 135–145)

## 2022-01-19 LAB — C-REACTIVE PROTEIN: CRP: 1.6 mg/dL — ABNORMAL HIGH (ref <1.0)

## 2022-01-19 MED ORDER — INSULIN ASPART 100 UNIT/ML IJ SOLN: 0.0000 [IU] | Freq: Every day | INTRAMUSCULAR | Status: DC

## 2022-01-19 MED ORDER — GADOBUTROL 1 MMOL/ML IV SOLN
6.0000 mL | Freq: Once | INTRAVENOUS | Status: AC | PRN
  Administered 2022-01-19: 6 mL via INTRAVENOUS

## 2022-01-19 MED ORDER — DEXAMETHASONE 4 MG PO TABS
2.0000 mg | ORAL_TABLET | Freq: Two times a day (BID) | ORAL | Status: DC
  Administered 2022-01-19 – 2022-01-21 (×4): 2 mg via ORAL
  Filled 2022-01-19 (×4): qty 1

## 2022-01-19 MED ORDER — INSULIN ASPART 100 UNIT/ML IJ SOLN
0.0000 [IU] | Freq: Three times a day (TID) | INTRAMUSCULAR | Status: DC
  Administered 2022-01-19 – 2022-01-20 (×2): 4 [IU] via SUBCUTANEOUS
  Administered 2022-01-20: 15 [IU] via SUBCUTANEOUS
  Administered 2022-01-20: 4 [IU] via SUBCUTANEOUS
  Administered 2022-01-21: 3 [IU] via SUBCUTANEOUS
  Administered 2022-01-21: 4 [IU] via SUBCUTANEOUS
  Filled 2022-01-19 (×6): qty 1

## 2022-01-19 NOTE — Assessment & Plan Note (Addendum)
61 year old female patient with stage IV lung cancer with brain metastases currently admitted to hospital for generalized weakness/hyperglycemia: ? ?# Generalized weakness-multifactorial secondary to hyperglycemia/hyper thyroidism: Continue dexamethasone 2 mg twice daily.  Given improvement of edema on CT scan brain.  Defer to hospitalist service regarding blood glucose control. ?  ?#Brain metastases : [April 24th MRI Brain]-thin slice MRI of the brain [5/13]-shows improvement of the edema continued lesions noted in the brain.  Discussed with Dr. Donella Stade.  We will plan SBRT. ? 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  After resolution of acute issues patient will need change of systemic therapy. ? ?#Debility: Steroid myopathy.  S/p physical therapy evaluation.  Recommend physical therapy at home. ? ?# Discussed with the patient and her significant other, Nichole Park in detail.  ? ?

## 2022-01-19 NOTE — Evaluation (Signed)
Physical Therapy Evaluation ?Patient Details ?Name: Nichole Park ?MRN: [id number] ?DOB: Dec 20, 1960 ?Today's Date: 01/19/2022 ? ?History of Present Illness ? Pt is a 61 y/o F admitted on 01/18/22 after presenting to the oncology clinic for routine f/u & had c/o generalized weakness & due to multiple lab abnormalities pt was referred to the ED. Pt is being treated for steroid induced hyperglycemia. PMH: metastatic lung CA on chemo with brain metastasis & progressive disease noted on most recent CT scan, latrogenic hypothyroidism, COPD, graves disease  ?Clinical Impression ? Pt seen for PT evaluation with pt received in bed. Pt reports she lives with her fiance who works during the day so she thinks she stays in bed during that time, but when fiance is home he provides assistance to her for gait with RW. Pt is AxOx3, not oriented to situation & bothered by this with PT attempting to reorient pt & provide encouragement. Pt is able to complete STP transfers with min assist & gait in room with RW & min assist. Pt is limited by fatigue but is encouraged to sit in recliner as much as possible. Will continue to follow pt acutely to address balance, endurance, and gait with LRAD. ?   ?   ? ?Recommendations for follow up therapy are one component of a multi-disciplinary discharge planning process, led by the attending physician.  Recommendations may be updated based on patient status, additional functional criteria and insurance authorization. ? ?Follow Up Recommendations Home health PT ? ?  ?Assistance Recommended at Discharge Frequent or constant Supervision/Assistance  ?Patient can return home with the following ? A little help with walking and/or transfers;A little help with bathing/dressing/bathroom;Assist for transportation;Direct supervision/assist for medications management;Help with stairs or ramp for entrance;Direct supervision/assist for financial management;Assistance with cooking/housework ? ?  ?Equipment  Recommendations None recommended by PT  ?Recommendations for Other Services ?    ?  ?Functional Status Assessment Patient has had a recent decline in their functional status and demonstrates the ability to make significant improvements in function in a reasonable and predictable amount of time.  ? ?  ?Precautions / Restrictions Precautions ?Precautions: Fall ?Restrictions ?Weight Bearing Restrictions: No  ? ?  ? ?Mobility ? Bed Mobility ?Overal bed mobility: Needs Assistance ?Bed Mobility: Supine to Sit ?  ?  ?Supine to sit: Supervision, HOB elevated ?  ?  ?General bed mobility comments: use of bed rails, HOB slightly elevated ?  ? ?Transfers ?Overall transfer level: Needs assistance ?Equipment used: Rolling walker (2 wheels) ?Transfers: Sit to/from Stand, Bed to chair/wheelchair/BSC ?Sit to Stand: Mod assist, Min assist ?Stand pivot transfers: Min assist (STP recliner<>BSC without AD & min assist) ?Step pivot transfers: Min assist (bed>recliner with RW) ?  ?  ?  ?General transfer comment: cuing for safe hand placement ?  ? ?Ambulation/Gait ?Ambulation/Gait assistance: Min assist ?Gait Distance (Feet): 30 Feet ?Assistive device: Rolling walker (2 wheels) ?Gait Pattern/deviations: Decreased step length - right, Decreased step length - left, Decreased stride length ?Gait velocity: decreased ?  ?  ?General Gait Details: Poor ability to negotiate around end of bed with RW. ? ?Stairs ?  ?  ?  ?  ?  ? ?Wheelchair Mobility ?  ? ?Modified Rankin (Stroke Patients Only) ?  ? ?  ? ?Balance Overall balance assessment: Needs assistance ?Sitting-balance support: Feet unsupported, Bilateral upper extremity supported ?Sitting balance-Leahy Scale: Good ?  ?  ?Standing balance support: During functional activity, No upper extremity supported ?Standing balance-Leahy Scale: Poor ?Standing balance comment:  Pt able to stand and managing mesh underwear for toileting with min assist. ?  ?  ?  ?  ?  ?  ?  ?  ?  ?  ?  ?   ? ? ? ?Pertinent  Vitals/Pain Pain Assessment ?Pain Assessment: No/denies pain  ? ? ?Home Living Family/patient expects to be discharged to:: Private residence ?Living Arrangements:  (fiance) ?Available Help at Discharge: Family;Available PRN/intermittently ?Type of Home: Mobile home ?Home Access: Stairs to enter ?Entrance Stairs-Rails:  (reports she has something she could possibly hold to but not formal rails) ?Entrance Stairs-Number of Steps: 1-2 STE from carport ?  ?Home Layout: One level ?Home Equipment: Rolling Walker (2 wheels);BSC/3in1;Wheelchair - manual ?   ?  ?Prior Function   ?  ?  ?  ?  ?  ?  ?Mobility Comments: Pt reports she ambulates with RW with assistance from fiance but then states he works & states "I guess I'm in the bed all day (while he's at work) which is why my legs are weak" ?  ?  ? ? ?Hand Dominance  ?   ? ?  ?Extremity/Trunk Assessment  ? Upper Extremity Assessment ?Upper Extremity Assessment: Generalized weakness ?  ? ?Lower Extremity Assessment ?Lower Extremity Assessment: Generalized weakness ?  ? ?   ?Communication  ? Communication: No difficulties  ?Cognition Arousal/Alertness: Awake/alert ?Behavior During Therapy: [facility] for tasks assessed/performed ?Overall Cognitive Status: No family/caregiver present to determine baseline cognitive functioning ?  ?  ?  ?  ?  ?  ?  ?  ?  ?  ?  ?  ?  ?  ?  ?  ?General Comments: Pt reports "I'm confused, I'm confused about what's going on" but AxO to self, location, time but not situation (reports she had a brain bleed) with pt attempting to orient pt to situation ?  ?  ? ?  ?General Comments General comments (skin integrity, edema, etc.): Pt with continent void on BSC. ? ?  ?Exercises    ? ?Assessment/Plan  ?  ?PT Assessment Patient needs continued PT services  ?PT Problem List Decreased strength;Decreased knowledge of use of DME;Decreased activity tolerance;Decreased balance;Decreased coordination;Impaired sensation;Decreased safety awareness;Decreased  mobility;Decreased cognition ? ?   ?  ?PT Treatment Interventions DME instruction;Balance training;Gait training;Neuromuscular re-education;Stair training;Functional mobility training;Patient/family education;Therapeutic activities;Therapeutic exercise;Cognitive remediation   ? ?PT Goals (Current goals can be found in the Care Plan section)  ?Acute Rehab PT Goals ?Patient Stated Goal: to clear up confusion re: situation ?PT Goal Formulation: With patient ?Time For Goal Achievement: 02/02/22 ?Potential to Achieve Goals: Fair ? ?  ?Frequency Min 2X/week ?  ? ? ?Co-evaluation   ?  ?  ?  ?  ? ? ?  ?AM-PAC PT "6 Clicks" Mobility  ?Outcome Measure Help needed turning from your back to your side while in a flat bed without using bedrails?: None ?Help needed moving from lying on your back to sitting on the side of a flat bed without using bedrails?: None ?Help needed moving to and from a bed to a chair (including a wheelchair)?: A Little ?Help needed standing up from a chair using your arms (e.g., wheelchair or bedside chair)?: A Little ?Help needed to walk in hospital room?: A Little ?Help needed climbing 3-5 steps with a railing? : A Lot ?6 Click Score: 19 ? ?  ?End of Session   ?Activity Tolerance: Patient tolerated treatment well;Patient limited by fatigue ?Patient left: in chair;with chair alarm set;with call bell/phone  within reach ?Nurse Communication: Mobility status ?PT Visit Diagnosis: Unsteadiness on feet (R26.81);Muscle weakness (generalized) (M62.81) ?  ? ?Time: 9611-6435 ?PT Time Calculation (min) (ACUTE ONLY): 15 min ? ? ?Charges:   PT Evaluation ?$PT Eval Low Complexity: 1 Low ?PT Treatments ?$Therapeutic Activity: 8-22 mins ?  ?   ? ? ?Lavone Nian, PT, DPT ?01/19/22, 10:16 AM ? ? ?[name] ?01/19/2022, 10:14 AM ? ?

## 2022-01-19 NOTE — Consult Note (Signed)
[place] ?CONSULT NOTE ? ?Patient Care Team: ?Hendrix as PCP - General ?End, Harrell Gave, MD as PCP - Cardiology (Cardiology) ?Telford Nab, RN as Sales executive ? ?CHIEF COMPLAINTS/PURPOSE OF CONSULTATION: Lung cancer ? ?HISTORY OF PRESENTING ILLNESS:  ?Nichole Park 61 y.o.  female stage IV adenocarcinoma the lung cancer is here currently on chemotherapy-immunotherapy is currently admitted to the hospital for generalized weakness/hyper glycemia. ? ?Patient was recently in the hospital for focal upper lower extremity weakness.  Patient status post MRI showed recurrent progressive brain lesions.  Evaluated by radiation oncology.  Patient is currently awaiting starting radiation. Patient continues to complain of weakness.  Complains of falls.  No seizures.  Feels extremely fatigued.  Positive for nausea but no headaches. ? ?Since admission to the hospital-patient had a CT scan of the brain noncontrast that showed improvement of the edema around the metastasis.  Patient is on insulin.  Blood sugars improved. ? ? ?Review of Systems  ?Constitutional:  Positive for malaise/fatigue. Negative for chills, diaphoresis, fever and weight loss.  ?HENT:  Negative for nosebleeds and sore throat.   ?Eyes:  Negative for double vision.  ?Respiratory:  Negative for cough, hemoptysis, sputum production, shortness of breath and wheezing.   ?Cardiovascular:  Negative for chest pain, palpitations, orthopnea and leg swelling.  ?Gastrointestinal:  Negative for abdominal pain, blood in stool, constipation, diarrhea, heartburn, melena, nausea and vomiting.  ?Genitourinary:  Negative for dysuria, frequency and urgency.  ?Musculoskeletal:  Negative for back pain and joint pain.  ?Skin: Negative.  Negative for itching and rash.  ?Neurological:  Negative for dizziness, tingling, focal weakness, weakness and headaches.  ?Endo/Heme/Allergies:  Does not bruise/bleed easily.  ?Psychiatric/Behavioral:   Positive for memory loss. Negative for depression. The patient is not nervous/anxious and does not have insomnia.    ? ?MEDICAL HISTORY:  ?Past Medical History:  ?Diagnosis Date  ? Graves disease   ? Malignant pericardial effusion   ? Paroxysmal atrial fibrillation (HCC)   ? Primary lung adenocarcinoma ([place])   ? ? ?SURGICAL HISTORY: ?Past Surgical History:  ?Procedure Laterality Date  ? IR CV LINE INJECTION  06/19/2021  ? IR IMAGING GUIDED PORT INSERTION  04/27/2021  ? PERICARDIOCENTESIS N/A 03/27/2021  ? Procedure: PERICARDIOCENTESIS;  Surgeon: Nelva Bush, MD;  Location: [facility];  Service: Cardiovascular;  Laterality: N/A;  ? TUBAL LIGATION    ? ? ?SOCIAL HISTORY: ?Social History  ? ?Socioeconomic History  ? Marital status: Widowed  ?  Spouse name: Not on file  ? Number of children: Not on file  ? Years of education: Not on file  ? Highest education level: Not on file  ?Occupational History  ? Not on file  ?Tobacco Use  ? Smoking status: Former  ?  Packs/day: 1.00  ?  Years: 40.00  ?  Pack years: 40.00  ?  Types: Cigarettes  ? Smokeless tobacco: Never  ?Vaping Use  ? Vaping Use: Never used  ?Substance and Sexual Activity  ? Alcohol use: No  ? Drug use: Not Currently  ?  Types: Marijuana  ? Sexual activity: Not Currently  ?Other Topics Concern  ? Not on file  ?Social History Narrative  ? Not on file  ? ?Social Determinants of Health  ? ?Financial Resource Strain: High Risk  ? Difficulty of Paying Living Expenses: Very hard  ?Food Insecurity: Not on file  ?Transportation Needs: Not on file  ?Physical Activity: Not on file  ?Stress: Stress Concern  Present  ? Feeling of Stress : Very much  ?Social Connections: Not on file  ?Intimate Partner Violence: Not on file  ? ? ?FAMILY HISTORY: ?Family History  ?Problem Relation Age of Onset  ? Peripheral Artery Disease Mother   ? Breast cancer Neg Hx   ? ? ?ALLERGIES:  is allergic to diazepam, keytruda [pembrolizumab], metoprolol, and tapazole  [methimazole]. ? ?MEDICATIONS:  ?Current Facility-Administered Medications  ?Medication Dose Route Frequency Provider Last Rate Last Admin  ? acetaminophen (TYLENOL) tablet 650 mg  650 mg Oral Q6H PRN Clance Boll, MD      ? Or  ? acetaminophen (TYLENOL) suppository 650 mg  650 mg Rectal Q6H PRN Clance Boll, MD      ? dexamethasone (DECADRON) tablet 2 mg  2 mg Oral Q12H Charlaine Dalton R, MD   2 mg at 01/19/22 2139  ? enoxaparin (LOVENOX) injection 40 mg  40 mg Subcutaneous Q24H Myles Rosenthal A, MD   40 mg at 59/56/38 7564  ? folic acid (FOLVITE) tablet 1 mg  1 mg Oral Daily Clance Boll, MD   1 mg at 01/19/22 0855  ? insulin aspart (novoLOG) injection 0-20 Units  0-20 Units Subcutaneous TID WC Enzo Bi, MD   4 Units at 01/19/22 1755  ? insulin detemir (LEVEMIR) injection 10 Units  10 Units Subcutaneous QHS Clance Boll, MD   10 Units at 01/19/22 2139  ? multivitamin with minerals tablet 1 tablet  1 tablet Oral Daily Clance Boll, MD   1 tablet at 01/19/22 0854  ? ondansetron (ZOFRAN) tablet 4 mg  4 mg Oral Q6H PRN Clance Boll, MD      ? Or  ? ondansetron (ZOFRAN) injection 4 mg  4 mg Intravenous Q6H PRN Clance Boll, MD      ? ? ?  ?. ? ?PHYSICAL EXAMINATION: ? ?Vitals:  ? 01/19/22 1705 01/19/22 2137  ?BP: (!) 148/94 (!) 143/91  ?Pulse: (!) 110 (!) 107  ?Resp: 16 16  ?Temp: (!) 97.5 ?F (36.4 ?C) 97.8 ?F (36.6 ?C)  ?SpO2: 99% 99%  ? ?Filed Weights  ? 01/18/22 1011 01/18/22 2003 01/19/22 0500  ?Weight: 130 lb (59 kg) 138 lb 0.1 oz (62.6 kg) 141 lb 8.6 oz (64.2 kg)  ? ? ?Physical Exam ?Vitals and nursing note reviewed.  ?HENT:  ?   Head: Normocephalic and atraumatic.  ?   Mouth/Throat:  ?   Pharynx: Oropharynx is clear.  ?Eyes:  ?   Extraocular Movements: Extraocular movements intact.  ?   Pupils: Pupils are equal, round, and reactive to light.  ?Cardiovascular:  ?   Rate and Rhythm: Normal rate and regular rhythm.  ?Pulmonary:  ?   Comments: Decreased breath  sounds bilaterally.  ?Abdominal:  ?   Palpations: Abdomen is soft.  ?Musculoskeletal:     ?   General: Normal range of motion.  ?   Cervical back: Normal range of motion.  ?Skin: ?   General: Skin is warm.  ?Neurological:  ?   General: No focal deficit present.  ?   Mental Status: She is alert and oriented to person, place, and time.  ?Psychiatric:     ?   Behavior: Behavior normal.     ?   Judgment: Judgment normal.  ? ? ? ?LABORATORY DATA:  ?I have reviewed the data as listed ?Lab Results  ?Component Value Date  ? WBC 19.5 (H) 01/19/2022  ? HGB 13.8 01/19/2022  ? HCT  39.5 01/19/2022  ? MCV 94.5 01/19/2022  ? PLT 115 (L) 01/19/2022  ? ?Recent Labs  ?  03/27/21 ?1635 03/28/21 ?0131 01/18/22 ?0830 01/18/22 ?1136 01/18/22 ?2003 01/19/22 ?0527  ?NA  --    < > 127* 132* 131* 132*  ?K  --    < > 5.3* 4.7 5.2* 4.5  ?CL  --    < > 94* 100 97* 99  ?CO2  --    < > 22 23 22 24   ?GLUCOSE  --    < > 461* 404* 484* 166*  ?BUN  --    < > 40* 40* 37* 28*  ?CREATININE  --    < > 0.82 0.68 0.85 0.56  ?CALCIUM  --    < > 9.0 8.6* 9.0 8.7*  ?GFRNONAA  --    < > >60 >60 >60 >60  ?PROT 7.7   < > 6.8 6.2* 6.5  --   ?ALBUMIN 3.2*   < > 3.4* 3.1* 3.1*  --   ?AST 31   < > 65* 50* 66*  --   ?ALT 21   < > 179* 161* 158*  --   ?ALKPHOS 74   < > 86 75 79  --   ?BILITOT 1.2   < > 1.2 1.4* 1.2  --   ?BILIDIR 0.4*  --   --   --   --   --   ?IBILI 0.8  --   --   --   --   --   ? < > = values in this interval not displayed.  ? ? ?RADIOGRAPHIC STUDIES: ?I have personally reviewed the radiological images as listed and agreed with the findings in the report. ?DG Chest 2 View ? ?Result Date: 12/31/2021 ?CLINICAL DATA:  Suspected Sepsis EXAM: CHEST - 2 VIEW COMPARISON:  Chest x-ray 05/02/2021. FINDINGS: Redemonstrated right lung mass, better characterized on prior CT chest from 10/01/2021. Multiple subtle nodular areas of consolidation in both lungs. No visible pleural effusions or pneumothorax. Cardiomediastinal silhouette is similar. Right IJ approach  central venous catheter with the tip projecting at the superior cavoatrial junction, similar. No displaced fracture. IMPRESSION: 1. Redemonstrated right lung mass, better characterized on prior CT chest from 10/01/2021

## 2022-01-19 NOTE — Progress Notes (Signed)
?PROGRESS NOTE ? ? ? ?Nichole Park  RSW:[id number] DOB: 03/26/1961 DOA: 01/18/2022 ?PCP: [facility]  ?120A/120A-AA ? ? ?Assessment & Plan: ?  ?Principal Problem: ?  Hyperglycemia ?Active Problems: ?  Electrolyte abnormality ? ? ?Nichole Park is a 61 y.o. 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  patient was referred to ED.  Patient has over the last week to 3 days noted to have decrease energy, increase fatigue, generalized weakness and some confusion.  Also noted increase urinary frequency.  ?  ?Steroid induced hyperglycemia ?--A1c 5.3 2 weeks ago.  Started on insulin on presentation. ?Plan: ?--cont Levemir 10u nightly ?--SSI resistant scale ?  ?Failure to thrive  ?Weakness ?Acute volume depletion due to polyuria ?Abnormal electrolytes ?In background of metastatic lung ca, with brain mets, on chemo/steroids ?--PT ?  ?Leukocytosis ?-due to steroids use.  Currently no signs of infection. ?--started on ceftriaxone on admission ?Plan: ?--d/c ceftriaxone ? 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 Rogue Bussing ?--inpatient oncology consult, with Dr. Rogue Bussing ?  ?Hyperthyroidism ?--was recently put on Synthroid due to elevated TSH (86) 3 months ago, however, unknown free T4 level at that time. ?--current TSH <0.01, and free T4 2.31 ?Plan: ?--d/c home Synthroid ?  ?COPD ?-no acute issues  ? ? ?DVT prophylaxis: Lovenox SQ ?Code Status: Full code  ?Family Communication: significant other updated at bedside today ? ?Level of care: Med-Surg ?Dispo:   ?The patient is from: home ?Anticipated d/c is to: home ?Anticipated d/c date is: 1-2 days ? ? ?Subjective and Interval History:  ?Pt reported feeling confused.  Continued to be  weak. ? ? ?Objective: ?Vitals:  ? 01/19/22 0026 01/19/22 0500 01/19/22 0509 01/19/22 0811  ?BP: (!) 147/98  (!) 160/104 (!) 150/90  ?Pulse: 98  93 91  ?Resp: 16  18 16   ?Temp: 97.9 ?F (36.6 ?C)  98.2 ?F (36.8 ?C) 97.8 ?F (36.6 ?C)  ?TempSrc: Oral   Oral  ?SpO2: 97%  98% 99%  ?Weight:  64.2 kg    ?Height:      ? ? ?Intake/Output Summary (Last 24 hours) at 01/19/2022 1538 ?Last data filed at 01/19/2022 0815 ?Gross per 24 hour  ?Intake 868.15 ml  ?Output 1150 ml  ?Net -281.85 ml  ? ?Filed Weights  ? 01/18/22 1011 01/18/22 2003 01/19/22 0500  ?Weight: 59 kg 62.6 kg 64.2 kg  ? ? ?Examination:  ? ?Constitutional: NAD, alert, oriented to person, place, president but not year. ?HEENT: conjunctivae and lids normal, EOMI ?CV: No cyanosis.   ?RESP: normal respiratory effort, on RA ?Extremities: No effusions, edema in BLE ?SKIN: warm, dry ?Neuro: II - XII grossly intact.   ? ? ?Data Reviewed: I have personally reviewed following labs and imaging studies ? ?CBC: ?Recent Labs  ?Lab 01/18/22 ?0830 01/18/22 ?1136 01/18/22 ?2003 01/19/22 ?0527  ?WBC 18.7* 17.8* 18.6* 19.5*  ?NEUTROABS 16.5*  --   --   --   ?HGB 16.1* 14.7 14.9 13.8  ?HCT 46.1* 43.4 43.7 39.5  ?MCV 95.1 96.2 95.4 94.5  ?PLT 152 124* 132* 115*  ? ?Basic Metabolic Panel: ?Recent Labs  ?Lab 01/18/22 ?0830 01/18/22 ?1136 01/18/22 ?2003 01/19/22 ?0527  ?NA 127* 132* 131* 132*  ?  K 5.3* 4.7 5.2* 4.5  ?CL 94* 100 97* 99  ?CO2 22 23 22 24   ?GLUCOSE 461* 404* 484* 166*  ?BUN 40* 40* 37* 28*  ?CREATININE 0.82 0.68 0.85 0.56  ?CALCIUM 9.0 8.6* 9.0 8.7*  ? ?GFR: ?Estimated Creatinine Clearance: 66.7 mL/min (by C-G formula based on SCr of 0.56 mg/dL). ?Liver Function Tests: ?Recent Labs  ?Lab 01/18/22 ?0830 01/18/22 ?1136 01/18/22 ?2003  ?AST 65* 50* 66*  ?ALT 179* 161* 158*  ?ALKPHOS 86 75 79  ?BILITOT 1.2 1.4* 1.2  ?PROT 6.8 6.2* 6.5  ?ALBUMIN 3.4* 3.1* 3.1*  ? ?No results for input(s): LIPASE, AMYLASE in the last 168 hours. ?No results for input(s): AMMONIA in the last 168  hours. ?Coagulation Profile: ?No results for input(s): INR, PROTIME in the last 168 hours. ?Cardiac Enzymes: ?No results for input(s): CKTOTAL, CKMB, CKMBINDEX, TROPONINI in the last 168 hours. ?BNP (last 3 results) ?No results for input(s): PROBNP in the last 8760 hours. ?HbA1C: ?No results for input(s): HGBA1C in the last 72 hours. ?CBG: ?Recent Labs  ?Lab 01/18/22 ?1012 01/18/22 ?2016 01/19/22 ?3154 01/19/22 ?1212  ?GLUCAP 481* 489* 185* 379*  ? ?Lipid Profile: ?No results for input(s): CHOL, HDL, LDLCALC, TRIG, CHOLHDL, LDLDIRECT in the last 72 hours. ?Thyroid Function Tests: ?Recent Labs  ?  01/18/22 ?2003 01/19/22 ?0500  ?TSH <0.010*  --   ?FREET4  --  2.31*  ? ?Anemia Panel: ?No results for input(s): VITAMINB12, FOLATE, FERRITIN, TIBC, IRON, RETICCTPCT in the last 72 hours. ?Sepsis Labs: ?Recent Labs  ?Lab 01/18/22 ?1118 01/18/22 ?1330 01/18/22 ?2003  ?PROCALCITON  --   --  <0.10  ?LATICACIDVEN 2.1* 1.7  --   ? ? ?Recent Results (from the past 240 hour(s))  ?Blood culture (single)     Status: None (Preliminary result)  ? Collection Time: 01/18/22 11:18 AM  ? Specimen: BLOOD  ?Result Value Ref Range Status  ? Specimen Description BLOOD BLOOD RIGHT WRIST  Final  ? Special Requests   Final  ?  BOTTLES DRAWN AEROBIC AND ANAEROBIC Blood Culture results may not be optimal due to an inadequate volume of blood received in culture bottles  ? Culture   Final  ?  NO GROWTH < 24 HOURS ?Performed at [facility], [address] [place], [place] [address] ?  ? Report Status PENDING  Incomplete  ?Urine Culture     Status: None  ? Collection Time: 01/18/22 11:36 AM  ? Specimen: Urine, Clean Catch  ?Result Value Ref Range Status  ? Specimen Description   Final  ?  URINE, CLEAN CATCH ?Performed at [facility], [name], [place], [place] [address] ?  ? Special Requests   Final  ?  NONE ?Performed at [facility], [address] [place], [place] [address] ?  ? Culture   Final  ?  NO  GROWTH ?Performed at [facility], [place] [address], [place], [place] [address] ?  ? Report Status 01/19/2022 FINAL  Final  ?  ? ? ?Radiology Studies: ?CT HEAD WO CONTRAST (5MM) ? ?Result Date: 01/18/2022 ?CLINICAL DATA:  History of metastatic non-small cell lung cancer. EXAM: CT HEAD WITHOUT CONTRAST TECHNIQUE: Contiguous axial images were obtained from the base of the skull through the vertex without intravenous contrast. RADIATION DOSE REDUCTION: This exam was performed according to the departmental dose-optimization program which includes automated exposure control, adjustment of the mA and/or kV according to patient size and/or use of iterative reconstruction technique. COMPARISON:  Head CT and MRI brain 12/31/2021. FINDINGS: Brain: Overall definite interval improvement in the amount of tumoral edema seen in the left parietal white matter. The actual metastatic focus is difficult to identify this study without contrast and is better seen on the MRI. No new areas of edema to suggest other metastatic lesions. No evidence of acute hemispheric infarction or intracranial hemorrhage. The brainstem and cerebellum are grossly and stable. Vascular: Minimal vascular calcifications are stable. No hyperdense vessels. Skull: No skull lesions. Sinuses/Orbits: The paranasal sinuses and mastoid air cells are clear. The globes are intact. Other: No scalp lesions or scalp hematoma. IMPRESSION: 1. Overall definite interval improvement in the amount of tumoral edema in the left parietal white matter. The actual metastatic focus is difficult to identify this study without contrast and is better seen on the MRI. 2. No new areas of edema to suggest other metastatic lesions. Electronically Signed   By: Marijo Sanes M.D.   On: 01/18/2022 14:30  ? ?DG Chest Port 1 View ? ?Result Date: 01/18/2022 ?CLINICAL DATA:  Weakness. EXAM: PORTABLE CHEST 1 VIEW COMPARISON:  December 31, 2021. FINDINGS: The heart size and mediastinal contours are  within normal limits. Right internal jugular Port-A-Cath is unchanged in position. Left lung is clear. Stable right upper lobe mass is noted. The visualized skeletal structures are unremarkable. IMPRESSION: Stable right upp

## 2022-01-19 NOTE — TOC Initial Note (Signed)
Transition of Care (TOC) - Initial/Assessment Note  ? ? ?Patient Details  ?Name: Nichole Park ?MRN: [id number] ?Date of Birth: 01-Mar-1961 ? ?Transition of Care (TOC) CM/SW Contact:    ?Harriet Masson, RN ?Phone Number: [phone] ?01/19/2022, 3:29 PM ? ?Clinical Narrative:                 ?Recommended for HHPT for discharged services. Initially spoke with pt and friend in the room Shanon Brow who requested RN to spoke with the daughter Summer who is the primary caregiver. RN reached out the Summer to initiation [facility] (receptive). Several agencies call with the following response: ? ?12:20 pm [facility]ne Cornea) Declined ?12:24 pm [facility] ([place]) Declined ?12:28 pm [facility] Judson Roch)  Declined ?12/26 pm [facility]mi Rumps) Accepted ? ?Verified the acceptance for HHPT upon pt's discharged and updated the pt's daughter Summer on the agency that will services the pt. TOC aware of the above arrangements. ? ?Pt lives alone in a trailer with a friend Shanon Brow) who is able to transport pt to all her medical appointments and obtain her medications at the [facility]. Pt has DME already for a RW and wheelchair. Daughter Summer updated on the acceptance for [facility] and to expect a call once pt returns home for pending services for HHPT.  No other needs presented. ? ?TOC will continue to follow up accordingly. ? ?Expected Discharge Plan: [place] ?Barriers to Discharge: Continued Medical Work up ? ? ?Patient Goals and CMS Choice ?  ?CMS Medicare.gov Compare Post Acute Care list provided to:: Patient ?Choice offered to / list presented to : Adult Children ? ?Expected Discharge Plan and Services ?Expected Discharge Plan: [place] ?  ?Discharge Planning Services: CM Consult ?  ?Living arrangements for the past 2 months: Mobile Home ?                ?  ?  ?  ?  ?  ?HH Arranged: PT ?[place] Agency: [facility] ?Date HH Agency Contacted: 01/19/22 ?Time [place]:  1500 ?Representative spoke with at [place]: Georgina Snell ? ?Prior Living Arrangements/Services ?Living arrangements for the past 2 months: Mobile Home ?Lives with:: Self ?Patient language and need for interpreter reviewed:: Yes ?Do you feel safe going back to the place where you live?: Yes      ?Need for Family Participation in Patient Care: Yes (Comment) ?Care giver support system in place?: Yes (comment) ?  ?Criminal Activity/Legal Involvement Pertinent to Current Situation/Hospitalization: No - Comment as needed ? ?Activities of Daily Living ?Home Assistive Devices/Equipment: Eyeglasses ?ADL Screening (condition at time of admission) ?Patient's cognitive ability adequate to safely complete daily activities?: Yes ?Is the patient deaf or have difficulty hearing?: No ?Does the patient have difficulty seeing, even when wearing glasses/contacts?: No ?Does the patient have difficulty concentrating, remembering, or making decisions?: No ?Patient able to express need for assistance with ADLs?: Yes ?Does the patient have difficulty dressing or bathing?: No ?Independently performs ADLs?: No ?Does the patient have difficulty walking or climbing stairs?: Yes ?Weakness of Legs: Both ?Weakness of Arms/Hands: None ? ?Permission Sought/Granted ?  ?Permission granted to share information with : Yes, Verbal Permission Granted ?   ?   ?   ?   ? ?Emotional Assessment ?Appearance:: Appears older than stated age ?Attitude/Demeanor/Rapport: Engaged ?Affect (typically observed): Accepting ?Orientation: : Oriented to Self, Oriented to Place, Oriented to  Time, Oriented to Situation ?  ?Psych Involvement: No (comment) ? ?  Admission diagnosis:  Weakness [R53.1] ?Hyperglycemia [R73.9] ?Malignant neoplasm of lung, unspecified laterality, unspecified part of lung ([place]) [C34.90] ?Electrolyte abnormality [E87.8] ?Patient Active Problem List  ? Diagnosis Date Noted  ? Electrolyte abnormality 01/19/2022  ? Hyperglycemia 01/18/2022  ? Hypothyroidism  01/01/2022  ? Paroxysmal atrial fibrillation ([place]) 01/01/2022  ? Lung cancer metastatic to brain [facility]) 01/01/2022  ? Metastatic lung cancer (metastasis from lung to other site) [facility]) 12/31/2021  ? Cancer of upper lobe of right lung ([place]) 04/10/2021  ? Lung mass   ? Palliative care encounter   ? PAF (paroxysmal atrial fibrillation) ([place]) 03/30/2021  ? COPD (chronic obstructive pulmonary disease) ([place]) 03/30/2021  ? Hyperthyroidism 03/29/2021  ? Malignant pericardial effusion   ? Tobacco use   ? Cardiac tamponade 03/27/2021  ? Cardiac/pericardial tamponade 03/27/2021  ? Hypertension 03/22/2014  ? ?PCP:  [place]:   ?[place], [address] [place] ?[place] ?[place] [address] ?Phone: [phone] Fax: [phone] ? ? ? ? ?Social Determinants of Health (SDOH) Interventions ?  ? ?Readmission Risk Interventions ?   ? View : No data to display.  ?  ?  ?  ? ? ? ?

## 2022-01-20 DIAGNOSIS — R739 Hyperglycemia, unspecified: Secondary | ICD-10-CM | POA: Diagnosis not present

## 2022-01-20 LAB — BASIC METABOLIC PANEL
Anion gap: 7 (ref 5–15)
BUN: 30 mg/dL — ABNORMAL HIGH (ref 6–20)
CO2: 21 mmol/L — ABNORMAL LOW (ref 22–32)
Calcium: 8.8 mg/dL — ABNORMAL LOW (ref 8.9–10.3)
Chloride: 106 mmol/L (ref 98–111)
Creatinine, Ser: 0.6 mg/dL (ref 0.44–1.00)
GFR, Estimated: >60 mL/min (ref >60)
Glucose, Bld: 202 mg/dL — ABNORMAL HIGH (ref 70–99)
Potassium: 4.9 mmol/L (ref 3.5–5.1)
Sodium: 134 mmol/L — ABNORMAL LOW (ref 135–145)

## 2022-01-20 LAB — HEMOGLOBIN A1C
Hgb A1c MFr Bld: 7.3 % — ABNORMAL HIGH (ref 4.8–5.6)
Mean Plasma Glucose: 162.81 mg/dL

## 2022-01-20 LAB — GLUCOSE, CAPILLARY
Glucose-Capillary: 188 mg/dL — ABNORMAL HIGH (ref 70–99)
Glucose-Capillary: 189 mg/dL — ABNORMAL HIGH (ref 70–99)
Glucose-Capillary: 301 mg/dL — ABNORMAL HIGH (ref 70–99)
Glucose-Capillary: 112 mg/dL — ABNORMAL HIGH (ref 70–99)

## 2022-01-20 LAB — CBC
HCT: 40.2 % (ref 36.0–46.0)
Hemoglobin: 14.1 g/dL (ref 12.0–15.0)
MCH: 32.8 pg (ref 26.0–34.0)
MCHC: 35.1 g/dL (ref 30.0–36.0)
MCV: 93.5 fL (ref 80.0–100.0)
Platelets: 82 10*3/uL — ABNORMAL LOW (ref 150–400)
RBC: 4.3 MIL/uL (ref 3.87–5.11)
RDW: 12.7 % (ref 11.5–15.5)
WBC: 17.8 10*3/uL — ABNORMAL HIGH (ref 4.0–10.5)
nRBC: 0 % (ref 0.0–0.2)

## 2022-01-20 LAB — MAGNESIUM: Magnesium: 2.6 mg/dL — ABNORMAL HIGH (ref 1.7–2.4)

## 2022-01-20 MED ORDER — DILTIAZEM HCL ER COATED BEADS 180 MG PO CP24
180.0000 mg | ORAL_CAPSULE | Freq: Every day | ORAL | Status: DC
  Administered 2022-01-20 – 2022-01-21 (×2): 180 mg via ORAL
  Filled 2022-01-20 (×2): qty 1

## 2022-01-20 NOTE — Progress Notes (Signed)
Physical Therapy Treatment ?Patient Details ?Name: Nichole Park ?MRN: [id number] ?DOB: [birth year] ?Today's Date: 01/20/2022 ? ? ?History of Present Illness Pt is a 61 y/o F admitted on 01/18/22 after presenting to the oncology clinic for routine f/u & had c/o generalized weakness & due to multiple lab abnormalities pt was referred to the ED. Pt is being treated for steroid induced hyperglycemia. 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 ?  ?PT Comments  ? ? Pt seen for PT tx with pt agreeable but visitor no longer present. Pt is AxOx4 but continues to state "I'm confused, I'm confused" & unable to elaborate; PT attempts to educate her on situation & purpose of PT. Pt also appears anxious. Pt does demonstrate either decreased BLE activation or more weakness in BLE during STS from elevated EOB, requiring multiple attempts with use of RW. Pt is able to ambulate in room with RW & CGA but min assist for impaired balance when turning. Pt does endorse chest pain during gait so assisted pt back to chair. SpO2 97-98%, HR 144bpm decreased to 128bpm with seated rest break. MD & nurse notified of events of session. Unsure if pt has 24 hr supervision at home so have updated d/c recommendations to STR as pt is unsafe to d/c home without 24 hr assistance at this time. ?  ?Recommendations for follow up therapy are one component of a multi-disciplinary discharge planning process, led by the attending physician.  Recommendations may be updated based on patient status, additional functional criteria and insurance authorization. ? ?Follow Up Recommendations ? Skilled nursing-short term rehab (<3 hours/day) ?  ?  ?Assistance Recommended at Discharge Frequent or constant Supervision/Assistance  ?Patient can return home with the following A little help with walking and/or transfers;A little help with  bathing/dressing/bathroom;Assist for transportation;Direct supervision/assist for medications management;Help with stairs or ramp for entrance;Direct supervision/assist for financial management;Assistance with cooking/housework ?  ?Equipment Recommendations ? None recommended by PT  ?  ?Recommendations for Other Services   ? ? ?  ?Precautions / Restrictions Precautions ?Precautions: Fall ?Restrictions ?Weight Bearing Restrictions: No  ?  ? ?Mobility ? Bed Mobility ?Overal bed mobility: Modified Independent ?Bed Mobility: Supine to Sit ?  ?  ?Supine to sit: Modified independent (Device/Increase time), HOB elevated ?  ?  ?General bed mobility comments: use of bed rails ?  ? ?Transfers ?Overall transfer level: Needs assistance ?Equipment used: Rolling walker (2 wheels) ?Transfers: Sit to/from Stand ?Sit to Stand: Min assist ?  ?  ?  ?  ?  ?General transfer comment: multiple attempts for STS from elevated EOB with cuing for hand placement, pt appears to either have BLE weakness or not fully actively, activate BLE during transfer ?  ? ?Ambulation/Gait ?Ambulation/Gait assistance: Min assist, Min guard ?Gait Distance (Feet): 30 Feet ?Assistive device: Rolling walker (2 wheels) ?Gait Pattern/deviations: Decreased step length - right, Decreased step length - left, Decreased stride length ?Gait velocity: decreased ?  ?  ?General Gait Details: Poor balance when turning around & min assist to correct. ? ? ?Stairs ?  ?  ?  ?  ?  ? ? ?Wheelchair Mobility ?  ? ?Modified Rankin (Stroke Patients Only) ?  ? ? ?  ?Balance Overall balance assessment: Needs assistance ?Sitting-balance support: Feet unsupported, Bilateral upper extremity supported ?Sitting balance-Leahy Scale: Good ?Sitting balance - Comments: able to adjust socks while sitting EOB with supervision ?  ?  Standing balance support: Bilateral upper extremity supported, During functional activity ?Standing balance-Leahy Scale: Poor ?  ?  ?  ?  ?  ?  ?  ?  ?  ?  ?  ?  ?  ? ?   ?Cognition Arousal/Alertness: Awake/alert ?Behavior During Therapy: [facility] for tasks assessed/performed ?Overall Cognitive Status: No family/caregiver present to determine baseline cognitive functioning ?  ?  ?  ?  ?  ?  ?  ?  ?  ?  ?  ?  ?  ?  ?  ?  ?General Comments: Pt AxOx4 but continues to state "I'm confused, I'm confused" but uanble to elaborate, appears to have impaired awareness especially in regards to mobility. Also appears anxious. ?  ?  ? ?  ?Exercises   ? ?  ?General Comments   ?  ?  ? ?Pertinent Vitals/Pain Pain Assessment ?Pain Assessment: No/denies pain  ? ? ?Home Living   ?  ?  ?  ?  ?  ?  ?  ?  ?  ?   ?  ?Prior Function    ?  ?  ?   ? ?PT Goals (current goals can now be found in the care plan section) Acute Rehab PT Goals ?Patient Stated Goal: to clear up confusion re: situation ?PT Goal Formulation: With patient ?Time For Goal Achievement: 02/02/22 ?Potential to Achieve Goals: Fair ?Progress towards PT goals: Progressing toward goals ? ?  ?Frequency ? ? ? Min 2X/week ? ? ? ?  ?PT Plan Discharge plan needs to be updated  ? ? ?Co-evaluation   ?  ?  ?  ?  ? ?  ?AM-PAC PT "6 Clicks" Mobility   ?Outcome Measure ? Help needed turning from your back to your side while in a flat bed without using bedrails?: None ?Help needed moving from lying on your back to sitting on the side of a flat bed without using bedrails?: None ?Help needed moving to and from a bed to a chair (including a wheelchair)?: A Little ?Help needed standing up from a chair using your arms (e.g., wheelchair or bedside chair)?: A Little ?Help needed to walk in hospital room?: A Little ?Help needed climbing 3-5 steps with a railing? : A Lot ?6 Click Score: 19 ? ?  ?End of Session   ?Activity Tolerance: Treatment limited secondary to medical complications (Comment) ?Patient left: in chair;with chair alarm set;with call bell/phone within reach ?Nurse Communication: Mobility status (HR & O2 during session - nurse & MD notified) ?PT Visit  Diagnosis: Unsteadiness on feet (R26.81);Muscle weakness (generalized) (M62.81) ?  ? ? ?Time: 2979-8921 ?PT Time Calculation (min) (ACUTE ONLY): 8 min ? ?Charges:  $Therapeutic Activity: 8-22 mins          ?          ? ?Lavone Nian, PT, DPT ?01/20/22, 2:35 PM ? ? ? ?[name] ?01/20/2022, 2:33 PM ? ?

## 2022-01-20 NOTE — Progress Notes (Signed)
Nichole Park   DOB:15-May-1961   WI#:[id number]   ? ?Subjective: Patient denies any worsening pain.  Continues to complain of fatigue.  She is sitting in the bed; no acute distress.  ? ?Objective:  ?Vitals:  ? 01/20/22 0741 01/20/22 1611  ?BP: (!) 144/95 (!) 161/95  ?Pulse: 96 (!) 110  ?Resp: 17 16  ?Temp: 98 ?F (36.7 ?C) 97.6 ?F (36.4 ?C)  ?SpO2: 98% 98%  ?  ? ?Intake/Output Summary (Last 24 hours) at 01/20/2022 1808 ?Last data filed at 01/20/2022 1118 ?Gross per 24 hour  ?Intake --  ?Output 1000 ml  ?Net -1000 ml  ? ? ?Physical Exam ?Vitals and nursing note reviewed.  ?HENT:  ?   Head: Normocephalic and atraumatic.  ?   Mouth/Throat:  ?   Pharynx: Oropharynx is clear.  ?Eyes:  ?   Extraocular Movements: Extraocular movements intact.  ?   Pupils: Pupils are equal, round, and reactive to light.  ?Cardiovascular:  ?   Rate and Rhythm: Normal rate and regular rhythm.  ?Pulmonary:  ?   Comments: Decreased breath sounds bilaterally.  ?Abdominal:  ?   Palpations: Abdomen is soft.  ?Musculoskeletal:     ?   General: Normal range of motion.  ?   Cervical back: Normal range of motion.  ?Skin: ?   General: Skin is warm.  ?Neurological:  ?   General: No focal deficit present.  ?   Mental Status: She is alert and oriented to person, place, and time.  ?Psychiatric:     ?   Behavior: Behavior normal.     ?   Judgment: Judgment normal.  ? ?  ?Labs:  ?Lab Results  ?Component Value Date  ? WBC 17.8 (H) 01/20/2022  ? HGB 14.1 01/20/2022  ? HCT 40.2 01/20/2022  ? MCV 93.5 01/20/2022  ? PLT 82 (L) 01/20/2022  ? NEUTROABS 16.5 (H) 01/18/2022  ? ? ?Lab Results  ?Component Value Date  ? NA 134 (L) 01/20/2022  ? K 4.9 01/20/2022  ? CL 106 01/20/2022  ? CO2 21 (L) 01/20/2022  ? ? ?Studies:  ?MR BRAIN W WO CONTRAST ? ?Result Date: 01/20/2022 ?CLINICAL DATA:  Initial evaluation for brain CNS neoplasm. EXAM: MRI HEAD WITHOUT AND WITH CONTRAST TECHNIQUE: Multiplanar, multiecho pulse sequences of the brain and surrounding structures were obtained  without and with intravenous contrast. CONTRAST:  66mL GADAVIST GADOBUTROL 1 MMOL/ML IV SOLN COMPARISON:  Prior MRI from 12/31/2021. FINDINGS: Brain: Previously identified intracranial metastatic deposit positioned at the left parietal lobe again seen. Overall, lesion is not significantly changed in overall size measuring 1.7 x 1.7 x 1.9 cm. However, the lesion demonstrates diminished peripheral enhancement as compared to previous exam. Additionally, associated vasogenic edema within the adjacent left parietal region is also improved. Improved mass effect on the left lateral ventricle without significant midline shift. Overall, changes are consistent with interval response to therapy. No new lesions identified. No acute or subacute infarct. Gray-white matter differentiation otherwise maintained. Underlying mild chronic microvascular ischemic disease noted. No acute intracranial hemorrhage. Small focus of chronic hemosiderin staining at the left frontal lobe noted, stable. No other new acute or chronic intracranial blood products. No other mass lesion or mass effect. No hydrocephalus or extra-axial fluid collection. Pituitary gland suprasellar region normal. Midline structures intact and normal. No other abnormal enhancement. Vascular: Major intracranial vascular flow voids are maintained. Skull and upper cervical spine: Craniocervical junction with normal limits. Bone marrow signal intensity normal. No focal marrow replacing lesion.  No scalp soft tissue abnormality. Sinuses/Orbits: Globes and orbital soft tissues within normal limits. Mild scattered mucosal thickening noted about the sphenoid ethmoidal and maxillary sinuses. Small bilateral mastoid effusions noted. Other: None. IMPRESSION: 1. Similar size of left parietal metastatic lesion, but with decreased enhancement and associated vasogenic edema, consistent with interval response to therapy. Secondary improved mass effect within the left parietal region as  compared to previous. No new lesions identified. 2. No other new or acute intracranial abnormality. Electronically Signed   By: Jeannine Boga M.D.   On: 01/20/2022 01:50   ? ?Cancer of upper lobe of right lung ([place]) ?61 year old female patient with stage IV lung cancer with brain metastases currently admitted to hospital for generalized weakness/hyperglycemia: ? ? ?# Generalized weakness-multifactorial secondary to hyperglycemia/hyper thyroidism: Continue dexamethasone 2 mg twice daily.  Given improvement of edema on CT scan brain.  Continue blood sugar control-patient will likely need oral hypoglycemic agent at discharge.  Also recommend holding Synthroid given hyper thyroid we will plan to monitor thyroid panel outpatient. ?  ?#Brain metastases : [April 24th MRI Brain]-thin slice MRI of the brain [5/13]-shows improvement of the edema continued lesions noted in the brain.  Will discuss with Dr. Donella Stade regarding initiation of radiation sooner, as patient has poor tolerance to steroids.c ? 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  After resolution of acute issues patient will need change of systemic therapy. ? ?#Debility: Steroid myopathy.  Await physical therapy evaluation. ? ?# Discussed with the patient and her significant other, [name] in detail.  ? ? ? ?Cammie Sickle, MD ?01/20/2022  6:08 PM ?  ?

## 2022-01-20 NOTE — TOC Progression Note (Addendum)
Transition of Care (TOC) - Progression Note  ? ? ?Patient Details  ?Name: Nichole Park ?MRN: [id number] ?Date of Birth: November 08, 1960 ? ?Transition of Care (TOC) CM/SW Contact  ?Anuar Walgren, LCSW ?Phone Number: [phone] ?01/20/2022, 2:59 PM ? ?Clinical Narrative:    ? ?CSW spoke with patient's daughter/primary caregiver [name] (Daughter)  ?[phone] to discuss discharge plan.  PT was concerned about patient returning home with h/h health and wanted to ensure patient would have assist at home.  Patient's SO works-part time and [name] stated she would assist with care, since she only live 10 minutes away.  CSW discuss SNF placement with [name] who stated she would rather patient return home with home health, since specific SNF placement and location could not be guaranteed. CSW updated PT/Unit RN and Attending.  Planned discharge for tomorrow 01/21/2022, patient has DME at home. ? ?Patient has been accepted by [facility] for home health services. ? ?Expected Discharge Plan: [place] ?Barriers to Discharge: Continued Medical Work up ? ?Expected Discharge Plan and Services ?Expected Discharge Plan: [place] ?  ?Discharge Planning Services: CM Consult ?  ?Living arrangements for the past 2 months: Mobile Home ?                ?  ?  ?  ?  ?  ?HH Arranged: PT ?[place] Agency: [place] ?Date HH Agency Contacted: 01/19/22 ?Time [place]: 1500 ?Representative spoke with at [place]: Georgina Snell ? ? ?Social Determinants of Health (SDOH) Interventions ?  ? ?Readmission Risk Interventions ?   ? View : No data to display.  ?  ?  ?  ? ? ?

## 2022-01-20 NOTE — Progress Notes (Signed)
?PROGRESS NOTE ? ? ? ?ARIZA EVANS  HCW:[id number] DOB: May 01, 1961 DOA: 01/18/2022 ?PCP: [place]  ?120A/120A-AA ? ? ?Assessment & Plan: ?  ?Principal Problem: ?  Hyperglycemia ?Active Problems: ?  Electrolyte abnormality ? ? ?AASHA DINA is a 61 y.o. 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  patient was referred to ED.  Patient has over the last week to 3 days noted to have decrease energy, increase fatigue, generalized weakness and some confusion.  Also noted increase urinary frequency.  ?  ?Steroid induced hyperglycemia ?--A1c 5.3 2 weeks ago.  Started on insulin on presentation. ?Plan: ?--cont Levemir 10u nightly ?--SSI ?  ?Failure to thrive  ?Weakness ?Acute volume depletion due to polyuria ?Abnormal electrolytes ?In background of metastatic lung ca, with brain mets, on chemo/steroids ?--PT re-eval today ?  ?Leukocytosis ?-due to steroids use.  Currently no signs of infection. ?--started on ceftriaxone on admission, since d/c'ed ? ?Lung CA IV with mets to brain, on chemo  ?--followed by oncology Dr. Rogue Bussing ?--inpatient oncology consult, with Dr. Rogue Bussing ?  ?Hyperthyroidism ?--was recently put on Synthroid due to elevated TSH (86) 3 months ago, however, unknown free T4 level at that time. ?--current TSH <0.01, and free T4 2.31 ?--d/c'ed home Synthroid ?--recheck TSH in 2-3 months ?  ?COPD ?-no acute issues  ? ?HTN ?--resume home Cardizem ? ? ?DVT prophylaxis: Lovenox SQ ?Code Status: Full code  ?Family Communication: significant other updated at bedside today ? ?Level of care: Med-Surg ?Dispo:   ?The patient is from: home ?Anticipated d/c is to: home ?Anticipated d/c date is: 1-2 days ? ? ?Subjective and Interval History:   ?Pt and significant other were concerned about pt's weakness.   ? ? ?Objective: ?Vitals:  ? 01/20/22 0500 01/20/22 0631 01/20/22 0741 01/20/22 1611  ?BP:  (!) 145/106 (!) 144/95 (!) 161/95  ?Pulse:  (!) 106 96 (!) 110  ?Resp:  16 17 16   ?Temp:  98.1 ?F (36.7 ?C) 98 ?F (36.7 ?C) 97.6 ?F (36.4 ?C)  ?TempSrc:   Oral   ?SpO2:  98% 98% 98%  ?Weight: 56.5 kg     ?Height:      ? ? ?Intake/Output Summary (Last 24 hours) at 01/20/2022 1642 ?Last data filed at 01/20/2022 1118 ?Gross per 24 hour  ?Intake --  ?Output 1000 ml  ?Net -1000 ml  ? ?Filed Weights  ? 01/18/22 2003 01/19/22 0500 01/20/22 0500  ?Weight: 62.6 kg 64.2 kg 56.5 kg  ? ? ?Examination:  ? ?Constitutional: NAD, AAOx3 ?HEENT: conjunctivae and lids normal, EOMI ?CV: No cyanosis.   ?RESP: normal respiratory effort, on RA ?Extremities: No effusions, edema in BLE ?SKIN: warm, dry ?Neuro: II - XII grossly intact.   ?Psych: anxious mood and affect.   ? ? ?Data Reviewed: I have personally reviewed following labs and imaging studies ? ?CBC: ?Recent Labs  ?Lab 01/18/22 ?0830 01/18/22 ?1136 01/18/22 ?2003 01/19/22 ?1761 01/20/22 ?6073  ?WBC 18.7* 17.8* 18.6* 19.5* 17.8*  ?NEUTROABS 16.5*  --   --   --   --   ?HGB 16.1* 14.7 14.9 13.8 14.1  ?HCT 46.1* 43.4 43.7 39.5 40.2  ?MCV 95.1 96.2 95.4 94.5 93.5  ?PLT 152 124* 132* 115* 82*  ? ?  Basic Metabolic Panel: ?Recent Labs  ?Lab 01/18/22 ?0830 01/18/22 ?1136 01/18/22 ?2003 01/19/22 ?7482 01/20/22 ?7078  ?NA 127* 132* 131* 132* 134*  ?K 5.3* 4.7 5.2* 4.5 4.9  ?CL 94* 100 97* 99 106  ?CO2 22 23 22 24  21*  ?GLUCOSE 461* 404* 484* 166* 202*  ?BUN 40* 40* 37* 28* 30*  ?CREATININE 0.82 0.68 0.85 0.56 0.60  ?CALCIUM 9.0 8.6* 9.0 8.7* 8.8*  ?MG  --   --   --   --  2.6*  ? ?GFR: ?Estimated Creatinine Clearance: 59.3 mL/min (by C-G formula based on SCr of 0.6 mg/dL). ?Liver Function Tests: ?Recent Labs  ?Lab 01/18/22 ?0830 01/18/22 ?1136 01/18/22 ?2003  ?AST 65* 50* 66*  ?ALT 179* 161* 158*  ?ALKPHOS 86 75 79  ?BILITOT 1.2 1.4* 1.2   ?PROT 6.8 6.2* 6.5  ?ALBUMIN 3.4* 3.1* 3.1*  ? ?No results for input(s): LIPASE, AMYLASE in the last 168 hours. ?No results for input(s): AMMONIA in the last 168 hours. ?Coagulation Profile: ?No results for input(s): INR, PROTIME in the last 168 hours. ?Cardiac Enzymes: ?No results for input(s): CKTOTAL, CKMB, CKMBINDEX, TROPONINI in the last 168 hours. ?BNP (last 3 results) ?No results for input(s): PROBNP in the last 8760 hours. ?HbA1C: ?No results for input(s): HGBA1C in the last 72 hours. ?CBG: ?Recent Labs  ?Lab 01/19/22 ?1703 01/19/22 ?2137 01/20/22 ?0713 01/20/22 ?1158 01/20/22 ?1607  ?GLUCAP 189* 207* 188* 189* 301*  ? ?Lipid Profile: ?No results for input(s): CHOL, HDL, LDLCALC, TRIG, CHOLHDL, LDLDIRECT in the last 72 hours. ?Thyroid Function Tests: ?Recent Labs  ?  01/18/22 ?2003 01/19/22 ?0500  ?TSH <0.010*  --   ?FREET4  --  2.31*  ? ?Anemia Panel: ?No results for input(s): VITAMINB12, FOLATE, FERRITIN, TIBC, IRON, RETICCTPCT in the last 72 hours. ?Sepsis Labs: ?Recent Labs  ?Lab 01/18/22 ?1118 01/18/22 ?1330 01/18/22 ?2003  ?PROCALCITON  --   --  <0.10  ?LATICACIDVEN 2.1* 1.7  --   ? ? ?Recent Results (from the past 240 hour(s))  ?Blood culture (single)     Status: None (Preliminary result)  ? Collection Time: 01/18/22 11:18 AM  ? Specimen: BLOOD  ?Result Value Ref Range Status  ? Specimen Description BLOOD BLOOD RIGHT WRIST  Final  ? Special Requests   Final  ?  BOTTLES DRAWN AEROBIC AND ANAEROBIC Blood Culture results may not be optimal due to an inadequate volume of blood received in culture bottles  ? Culture   Final  ?  NO GROWTH 2 DAYS ?Performed at [facility], [address] [place], [place] [address] ?  ? Report Status PENDING  Incomplete  ?Urine Culture     Status: None  ? Collection Time: 01/18/22 11:36 AM  ? Specimen: Urine, Clean Catch  ?Result Value Ref Range Status  ? Specimen Description   Final  ?  URINE, CLEAN CATCH ?Performed at [facility], [address], [place], [place] [address] ?  ? Special Requests   Final  ?  NONE ?Performed at [facility], [address] [place], [place] [address] ?  ? Culture   Final  ?  NO GROWTH ?Performed at [facility], [place] [address], [place],  [address] ?  ? Report Status 01/19/2022 FINAL  Final  ?  ? ? ?Radiology Studies: ?MR BRAIN W WO CONTRAST ? ?Result Date: 01/20/2022 ?CLINICAL DATA:  Initial evaluation for brain CNS neoplasm. EXAM: MRI HEAD WITHOUT AND WITH CONTRAST TECHNIQUE: Multiplanar, multiecho pulse  sequences of the brain and surrounding structures were obtained without and with intravenous contrast. CONTRAST:  13mL GADAVIST GADOBUTROL 1 MMOL/ML IV SOLN COMPARISON:  Prior MRI from 12/31/2021. FINDINGS: Brain: Previously identified intracranial metastatic deposit positioned at the left parietal lobe again seen. Overall, lesion is not significantly changed in overall size measuring 1.7 x 1.7 x 1.9 cm. However, the lesion demonstrates diminished peripheral enhancement as compared to previous exam. Additionally, associated vasogenic edema within the adjacent left parietal region is also improved. Improved mass effect on the left lateral ventricle without significant midline shift. Overall, changes are consistent with interval response to therapy. No new lesions identified. No acute or subacute infarct. Gray-white matter differentiation otherwise maintained. Underlying mild chronic microvascular ischemic disease noted. No acute intracranial hemorrhage. Small focus of chronic hemosiderin staining at the left frontal lobe noted, stable. No other new acute or chronic intracranial blood products. No other mass lesion or mass effect. No hydrocephalus or extra-axial fluid collection. Pituitary gland suprasellar region normal. Midline structures intact and normal. No other abnormal enhancement. Vascular: Major intracranial vascular flow voids are maintained. Skull and upper cervical spine: Craniocervical junction  with normal limits. Bone marrow signal intensity normal. No focal marrow replacing lesion. No scalp soft tissue abnormality. Sinuses/Orbits: Globes and orbital soft tissues within normal limits. Mild scattered

## 2022-01-21 ENCOUNTER — Encounter: Payer: Self-pay | Admitting: Internal Medicine

## 2022-01-21 DIAGNOSIS — R739 Hyperglycemia, unspecified: Secondary | ICD-10-CM | POA: Diagnosis not present

## 2022-01-21 LAB — BASIC METABOLIC PANEL
Anion gap: 6 (ref 5–15)
BUN: 34 mg/dL — ABNORMAL HIGH (ref 6–20)
CO2: 25 mmol/L (ref 22–32)
Calcium: 9.2 mg/dL (ref 8.9–10.3)
Chloride: 105 mmol/L (ref 98–111)
Creatinine, Ser: 0.67 mg/dL (ref 0.44–1.00)
GFR, Estimated: >60 mL/min (ref >60)
Glucose, Bld: 118 mg/dL — ABNORMAL HIGH (ref 70–99)
Potassium: 4.6 mmol/L (ref 3.5–5.1)
Sodium: 136 mmol/L (ref 135–145)

## 2022-01-21 LAB — CBC
HCT: 41.7 % (ref 36.0–46.0)
Hemoglobin: 14.2 g/dL (ref 12.0–15.0)
MCH: 32.3 pg (ref 26.0–34.0)
MCHC: 34.1 g/dL (ref 30.0–36.0)
MCV: 94.8 fL (ref 80.0–100.0)
Platelets: 77 10*3/uL — ABNORMAL LOW (ref 150–400)
RBC: 4.4 MIL/uL (ref 3.87–5.11)
RDW: 12.6 % (ref 11.5–15.5)
WBC: 18.4 10*3/uL — ABNORMAL HIGH (ref 4.0–10.5)
nRBC: 0 % (ref 0.0–0.2)

## 2022-01-21 LAB — HEMOGLOBIN A1C
Hgb A1c MFr Bld: 7.1 % — ABNORMAL HIGH (ref 4.8–5.6)
Mean Plasma Glucose: 157.07 mg/dL

## 2022-01-21 LAB — GLUCOSE, CAPILLARY
Glucose-Capillary: 150 mg/dL — ABNORMAL HIGH (ref 70–99)
Glucose-Capillary: 196 mg/dL — ABNORMAL HIGH (ref 70–99)
Glucose-Capillary: 260 mg/dL — ABNORMAL HIGH (ref 70–99)

## 2022-01-21 LAB — MAGNESIUM: Magnesium: 2.7 mg/dL — ABNORMAL HIGH (ref 1.7–2.4)

## 2022-01-21 MED ORDER — INSULIN GLARGINE 100 UNIT/ML SOLOSTAR PEN: 10.0000 [IU] | PEN_INJECTOR | Freq: Every day | SUBCUTANEOUS | 2 refills | Status: AC

## 2022-01-21 MED ORDER — DEXAMETHASONE 2 MG PO TABS: 2.0000 mg | ORAL_TABLET | Freq: Two times a day (BID) | ORAL | 0 refills | Status: AC

## 2022-01-21 MED ORDER — HEPARIN SOD (PORK) LOCK FLUSH 100 UNIT/ML IV SOLN
500.0000 [IU] | Freq: Once | INTRAVENOUS | Status: AC
  Administered 2022-01-21: 500 [IU] via INTRAVENOUS
  Filled 2022-01-21: qty 5

## 2022-01-21 MED ORDER — INSULIN PEN NEEDLE 31G X 6 MM MISC: 1.0000 [IU] | Freq: Every day | 2 refills | Status: AC

## 2022-01-21 NOTE — Progress Notes (Addendum)
Inpatient Diabetes Program Recommendations ? ?AACE/ADA: New Consensus Statement on Inpatient Glycemic Control (2015) ? ?Target Ranges:  Prepandial:   less than 140 mg/dL ?     Peak postprandial:   less than 180 mg/dL (1-2 hours) ?     Critically ill patients:  140 - 180 mg/dL  ? ?Lab Results  ?Component Value Date  ? GLUCAP 196 (H) 01/21/2022  ? HGBA1C 7.3 (H) 01/20/2022  ? ? ?Review of Glycemic Control ? Latest Reference Range & Units 01/21/22 07:51 01/21/22 12:12  ?Glucose-Capillary 70 - 99 mg/dL 150 (H) 196 (H)  ? ?Diabetes history: None- steroid induced hyperglycemia ?Outpatient Diabetes medications:  ?None ?Current orders for Inpatient glycemic control:  ?Novolog resistant tid with meals ?Levemir 10 units q HS ?Decadron 2 mg bid ? ?Inpatient Diabetes Program Recommendations:   ? ?Patient to be discharged home with insulin due to steroids and hyperglycemia.  Her boyfriend was very attentive to her needs and states that he will be the one to administer insulin/check blood sugars.  Offered them CGM to check blood sugars while on steroids and insulin.  ? ?MD ordered application of Freestyle CGM at discharge for patient. Education done regarding application and changing CGM sensor (alternate every 14 days on back of arms), 1 hour warm-up, use of glucometer when alert displays, how to scan CGM for glucose reading and information for PCP. Patient has also been given educational packet regarding use CGM sensor including the 1-800 toll free number for any questions, problems or needs related to the [facility] sensors or reader.    Sensor applied by patient to Left Arm at 12:45p.  Explained that glucose readings will not be available until 1 hour after application (warm up period is 12 hours). Reviewed use of CGM including how to scan, changing Sensor, Vitamin C warning, arrows with glucose readings, and Freestyle app.   ? ?Also reviewed use of insulin pen. Educated patient and significant otheron insulin pen use at  home.   Reviewed all steps if insulin pen including attachment of needle, 2-unit air shot, dialing up dose, giving injection, removing needle, disposal of sharps, storage of unused insulin, disposal of insulin etc. Significant other able to provide successful return demonstration.  Also reviewed troubleshooting with insulin pen.  MD to give patient Rxs for insulin pens and insulin pen needles.  ? ?Thanks,  ?Adah Perl, RN, BC-ADM ?Inpatient Diabetes Coordinator ?Pager [phone]  (8a-5p) ? ?1400- alerted by RN that CGM reading was significantly lower then fingerstick.  Patient had just eaten lunch.  Explained that there can be a variance between fingersticks and CGM readings especially when blood sugars changing rapidly and in the first 12 hours after application.  Discussed with Significant other.  Patient will be on one injection per day (not dependent on glucose reading).  CGM should help with trending and assist MD in making further changes in insulin regimen.  Once steroids stopped, patient will likely not need insulin.  Explained this to patient and her boyfriend.  ? ? ?

## 2022-01-21 NOTE — Discharge Summary (Signed)
Physician Discharge Summary   Nichole Park  female DOB: 06-06-1961  BHA:[id number]  PCP: [place] date: 01/18/2022 Discharge date: 01/21/2022  Admitted From: home Disposition:  home Home Health: Yes CODE STATUS: Full code  Discharge Instructions     Diet Carb Modified   Complete by: As directed    Discharge instructions   Complete by: As directed    Dr. Rogue Bussing wants you to continue taking Decadron 2 mg twice daily.  While you are on Decadron, please take Insulin Lantus 10 units nightly.  Stop taking Synthroid.  Your thyroid hormone is too high now.  Please have your outpatient provider recheck thyroid level in 2 months.   Dr. Enzo Bi [facility] Course:  For full details, please see H&P, progress notes, consult notes and ancillary notes.  Briefly,  Nichole Park is a 61 y.o. 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  patient was referred to ED.  Patient has over the last week to 3 days noted to have decrease energy, increase fatigue, generalized weakness and some confusion.  Also noted increase urinary frequency.    Steroid induced hyperglycemia --A1c 5.3 2 weeks ago, currently 7.3.  Hyperglycemia likely due to steroid use.  Started on insulin on presentation.  Pt was discharged on Lantus 10u nightly.  Teaching to pt and significant other provided by diabetic coordinator.  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 Rogue Bussing, who saw pt as inpatient consult.  Will continue to follow as outpatient after discharge. --Decadron decreased to 2 mg BID by Dr. Rogue Bussing.   Hyperkalemia --resolved with insulin treatment of  hyperglycemia  Pseudohyponatremia --2/2 hyperglycemia and dehydration.  Resolved with treatment of hyperglycemia and IVF.  Failure to thrive  Weakness Acute volume depletion due to polyuria Abnormal electrolytes In background of metastatic lung ca, with brain mets, on chemo/steroids   Leukocytosis -due to steroids use.  Currently no signs of infection. --started on ceftriaxone on admission, since d/c'ed   Hyperthyroidism --was recently put on Synthroid due to elevated TSH (86) 3 months ago, however, unknown free T4 level at that time. --current TSH <0.01, and free T4 2.31 --d/c'ed home Synthroid --recheck TSH in 2-3 months   COPD -no acute issues    HTN --cont home Cardizem   Discharge Diagnoses:  Principal Problem:   Hyperglycemia Active Problems:   Electrolyte abnormality   30 Day Unplanned Readmission Risk Score    Flowsheet Row ED to Hosp-Admission (Current) from 01/18/2022 in [place]  30 Day Unplanned Readmission Risk Score (%) 22.84 Filed at 01/21/2022 1200       This score is the patient's risk of an unplanned readmission within 30 days of being discharged (0 -100%). The score is based on dignosis, age, lab data, medications, orders, and past utilization.   Low:  0-14.9   Medium: 15-21.9   High: 22-29.9   Extreme: 30 and above         Discharge Instructions:  Allergies as of 01/21/2022       Reactions   Diazepam Other (See Comments)   Very high emotionally and very low  emotionally, hyperventilate and passes out Very high emotionally and very low emotionally, hyperventilate and passes out   [place] [pembrolizumab] Rash   Metoprolol    Tapazole [methimazole]         Medication List     STOP taking these medications    levothyroxine 150 MCG tablet Commonly known as: Synthroid   meclizine 25 MG tablet Commonly known as: ANTIVERT       TAKE these medications    bismuth subsalicylate 510  CH/85ID suspension Commonly known as: PEPTO BISMOL Take 30 mLs by mouth every 6 (six) hours as needed.   dexamethasone 2 MG tablet Commonly known as: DECADRON Take 1 tablet (2 mg total) by mouth 2 (two) times daily. What changed: Another medication with the same name was removed. Continue taking this medication, and follow the directions you see here.   diltiazem 180 MG 24 hr capsule Commonly known as: CARDIZEM CD Take 1 capsule (180 mg total) by mouth daily.   folic acid 1 MG tablet Commonly known as: FOLVITE Take 1 tablet (1 mg total) by mouth once daily.   insulin glargine 100 UNIT/ML Solostar Pen Commonly known as: LANTUS Inject 10 Units into the skin at bedtime.   Insulin Pen Needle 31G X 6 MM Misc 1 Units by Does not apply route at bedtime.   Multi-Vitamins Tabs Take 1 tablet by mouth daily.   ondansetron 4 MG tablet Commonly known as: ZOFRAN Take 2 tablets (8mg  total) by mouth once every 8 hours as needed for nausea and vomiting.   TURMERIC PO Take 1 tablet by mouth daily.         Follow-up Information     Cammie Sickle, MD Follow up.   Specialties: Internal Medicine, Oncology Contact information: [place] [id number] [phone]                 Allergies  Allergen Reactions   Diazepam Other (See Comments)    Very high emotionally and very low emotionally, hyperventilate and passes out Very high emotionally and very low emotionally, hyperventilate and passes out    Keytruda [Pembrolizumab] Rash   Metoprolol    Tapazole [Methimazole]      The results of significant diagnostics from this hospitalization (including imaging, microbiology, ancillary and laboratory) are listed below for reference.   Consultations:   Procedures/Studies: DG Chest 2 View  Result Date: 12/31/2021 CLINICAL DATA:  Suspected Sepsis EXAM: CHEST - 2 VIEW COMPARISON:  Chest x-ray 05/02/2021. FINDINGS: Redemonstrated right lung mass,  better characterized on prior CT chest from 10/01/2021. Multiple subtle nodular areas of consolidation in both lungs. No visible pleural effusions or pneumothorax. Cardiomediastinal silhouette is similar. Right IJ approach central venous catheter with the tip projecting at the superior cavoatrial junction, similar. No displaced fracture. IMPRESSION: 1. Redemonstrated right lung mass, better characterized on prior CT chest from 10/01/2021. 2. Multiple subtle nodular areas of consolidation in both lungs, which could represent pneumonia, metastases, or artifact. Chest CT could further characterize if clinically warranted. Electronically Signed   By: Margaretha Sheffield M.D.   On: 12/31/2021 17:31   CT HEAD WO CONTRAST (5MM)  Result Date: 01/18/2022 CLINICAL DATA:  History of metastatic non-small cell lung cancer. EXAM: CT HEAD WITHOUT CONTRAST TECHNIQUE: Contiguous axial images were obtained from the base of the skull through the vertex without intravenous contrast. RADIATION DOSE REDUCTION: This exam was performed according to the departmental dose-optimization program which includes automated exposure control, adjustment of the mA and/or  kV according to patient size and/or use of iterative reconstruction technique. COMPARISON:  Head CT and MRI brain 12/31/2021. FINDINGS: Brain: Overall definite interval improvement in the amount of tumoral edema seen in the left parietal white matter. The actual metastatic focus is difficult to identify this study without contrast and is better seen on the MRI. No new areas of edema to suggest other metastatic lesions. No evidence of acute hemispheric infarction or intracranial hemorrhage. The brainstem and cerebellum are grossly and stable. Vascular: Minimal vascular calcifications are stable. No hyperdense vessels. Skull: No skull lesions. Sinuses/Orbits: The paranasal sinuses and mastoid air cells are clear. The globes are intact. Other: No scalp lesions or scalp hematoma.  IMPRESSION: 1. Overall definite interval improvement in the amount of tumoral edema in the left parietal white matter. The actual metastatic focus is difficult to identify this study without contrast and is better seen on the MRI. 2. No new areas of edema to suggest other metastatic lesions. Electronically Signed   By: Marijo Sanes M.D.   On: 01/18/2022 14:30   CT Head Wo Contrast  Result Date: 12/31/2021 CLINICAL DATA:  Altered mental status weakness EXAM: CT HEAD WITHOUT CONTRAST TECHNIQUE: Contiguous axial images were obtained from the base of the skull through the vertex without intravenous contrast. RADIATION DOSE REDUCTION: This exam was performed according to the departmental dose-optimization program which includes automated exposure control, adjustment of the mA and/or kV according to patient size and/or use of iterative reconstruction technique. COMPARISON:  MRI 10/24/2021 FINDINGS: Brain: No hemorrhage or large vessel territorial infarct. Worsened vasogenic edema within the left frontal and parietal white matter, suspicious for disease progression. Minimal 2 mm midline shift to the right. The ventricles are nonenlarged. Vascular: No hyperdense vessel or unexpected calcification. Carotid vascular calcification Skull: Normal. Negative for fracture or focal lesion. Sinuses/Orbits: No acute finding. Other: None IMPRESSION: 1. Worsened hypodensity/edema within the left frontal and parietal white matter with minimal 2 mm midline shift to the right, concerning for disease progression. Follow-up MRI with and without contrast is recommended. Electronically Signed   By: Donavan Foil M.D.   On: 12/31/2021 17:31   CT Chest Wo Contrast  Result Date: 12/31/2021 CLINICAL DATA:  Abnormal chest x-ray EXAM: CT CHEST WITHOUT CONTRAST TECHNIQUE: Multidetector CT imaging of the chest was performed following the standard protocol without IV contrast. RADIATION DOSE REDUCTION: This exam was performed according to the  departmental dose-optimization program which includes automated exposure control, adjustment of the mA and/or kV according to patient size and/or use of iterative reconstruction technique. COMPARISON:  Chest x-ray 12/31/2021, CT 10/01/2021 FINDINGS: Cardiovascular: Limited evaluation without intravenous contrast. Aorta is nonaneurysmal. Moderate aortic atherosclerosis. Normal cardiac size. No pericardial effusion. Right-sided central venous catheter tip at the cavoatrial junction. Mediastinum/Nodes: Midline trachea. No thyroid mass. Subcentimeter mediastinal lymph nodes. Previously measured right paratracheal lymph node mildly increased to 7 mm from 4 mm previously. Low right paratracheal lymph node esophagus normal. Multiple enlarged left axillary lymph nodes measuring up to 2 cm, increased compared to prior. Lungs/Pleura: Emphysema. Spiculated right upper lobe lung mass increased in size, this measures 4.5 x 3.3 by 4.2 cm, prior measurements of 3.5 x 2.4 x 2.9 cm. Interim development of multiple pulmonary nodules consistent with metastatic disease, for example right lower lobe pulmonary nodule measuring 8 mm, series 4, image 65. Irregular left lower lobe pulmonary nodule measuring 10 mm, series 4, image number 110. Pulmonary nodules are felt to correspond to the radiographic abnormality seen today. Upper Abdomen:  No acute abnormality. Musculoskeletal: No chest wall mass or suspicious bone lesions identified. IMPRESSION: 1. Interval increase in size of large spiculated right upper lobe lung mass since prior CT concerning for disease progression. Interim development of multiple bilateral lung nodules, also concerning for progression of disease. Increased left axillary adenopathy also concerning for progression of disease. 2. Emphysema Aortic Atherosclerosis (ICD10-I70.0) and Emphysema (ICD10-J43.9). Electronically Signed   By: Donavan Foil M.D.   On: 12/31/2021 18:51   MR BRAIN W WO CONTRAST  Result Date:  01/20/2022 CLINICAL DATA:  Initial evaluation for brain CNS neoplasm. EXAM: MRI HEAD WITHOUT AND WITH CONTRAST TECHNIQUE: Multiplanar, multiecho pulse sequences of the brain and surrounding structures were obtained without and with intravenous contrast. CONTRAST:  37mL GADAVIST GADOBUTROL 1 MMOL/ML IV SOLN COMPARISON:  Prior MRI from 12/31/2021. FINDINGS: Brain: Previously identified intracranial metastatic deposit positioned at the left parietal lobe again seen. Overall, lesion is not significantly changed in overall size measuring 1.7 x 1.7 x 1.9 cm. However, the lesion demonstrates diminished peripheral enhancement as compared to previous exam. Additionally, associated vasogenic edema within the adjacent left parietal region is also improved. Improved mass effect on the left lateral ventricle without significant midline shift. Overall, changes are consistent with interval response to therapy. No new lesions identified. No acute or subacute infarct. Gray-white matter differentiation otherwise maintained. Underlying mild chronic microvascular ischemic disease noted. No acute intracranial hemorrhage. Small focus of chronic hemosiderin staining at the left frontal lobe noted, stable. No other new acute or chronic intracranial blood products. No other mass lesion or mass effect. No hydrocephalus or extra-axial fluid collection. Pituitary gland suprasellar region normal. Midline structures intact and normal. No other abnormal enhancement. Vascular: Major intracranial vascular flow voids are maintained. Skull and upper cervical spine: Craniocervical junction with normal limits. Bone marrow signal intensity normal. No focal marrow replacing lesion. No scalp soft tissue abnormality. Sinuses/Orbits: Globes and orbital soft tissues within normal limits. Mild scattered mucosal thickening noted about the sphenoid ethmoidal and maxillary sinuses. Small bilateral mastoid effusions noted. Other: None. IMPRESSION: 1. Similar size  of left parietal metastatic lesion, but with decreased enhancement and associated vasogenic edema, consistent with interval response to therapy. Secondary improved mass effect within the left parietal region as compared to previous. No new lesions identified. 2. No other new or acute intracranial abnormality. Electronically Signed   By: Jeannine Boga M.D.   On: 01/20/2022 01:50   MR BRAIN W WO CONTRAST  Result Date: 12/31/2021 CLINICAL DATA:  Right foot tingling and numb EXAM: MRI HEAD WITHOUT AND WITH CONTRAST TECHNIQUE: Multiplanar, multiecho pulse sequences of the brain and surrounding structures were obtained without and with intravenous contrast. CONTRAST:  82mL GADAVIST GADOBUTROL 1 MMOL/ML IV SOLN COMPARISON:  10/24/2021 MRI, correlation is also made with 12/31/2021 CT head FINDINGS: Brain: Restricted diffusion in the left parietal lobe, which appears associated with the known mass. No other restricted diffusion to suggest acute or subacute infarct. Interval increase in the size of the peripherally enhancing mass in the left parietal lobe, which now measures up to 1.7 x 1.7 x 2.0 cm (series 18, image 110 and series 19, image 10), previously 0.7 x 0.6 x 0.7 cm when remeasured similarly. Significantly increased surrounding edema, which extends into the posterior left frontal lobe and superior left temporal and occipital lobes, which causes mass effect on the left lateral ventricle and 5 mm of left-to-right midline shift. The previously noted punctate left frontal lobe enhancing lesion is no longer seen. No  new lesions. No acute hemorrhage, hydrocephalus, or extra-axial collection. Vascular: Normal flow voids. Skull and upper cervical spine: Normal marrow signal. No abnormal enhancement. Sinuses/Orbits: Mucosal thickening in the maxillary sinuses and ethmoid air cells. The orbits are unremarkable. Other: Fluid in bilateral mastoid air cells. IMPRESSION: 1. Interval increase in the size of the  previously noted left parietal metastatic lesion, with significantly increased surrounding edema, which results in mass effect on the left lateral ventricle and 5 mm of left-to-right midline shift. 2. Previously noted left frontal lesion is no longer seen. Electronically Signed   By: Merilyn Baba M.D.   On: 12/31/2021 22:56   DG Chest Port 1 View  Result Date: 01/18/2022 CLINICAL DATA:  Weakness. EXAM: PORTABLE CHEST 1 VIEW COMPARISON:  December 31, 2021. FINDINGS: The heart size and mediastinal contours are within normal limits. Right internal jugular Port-A-Cath is unchanged in position. Left lung is clear. Stable right upper lobe mass is noted. The visualized skeletal structures are unremarkable. IMPRESSION: Stable right upper lobe mass is noted.  Left lung is clear. Electronically Signed   By: Marijo Conception M.D.   On: 01/18/2022 11:35      Labs: BNP (last 3 results) Recent Labs    04/03/21 0327  BNP 570.1*   Basic Metabolic Panel: Recent Labs  Lab 01/18/22 1136 01/18/22 2003 01/19/22 0527 01/20/22 0545 01/21/22 0354  NA 132* 131* 132* 134* 136  K 4.7 5.2* 4.5 4.9 4.6  CL 100 97* 99 106 105  CO2 23 22 24  21* 25  GLUCOSE 404* 484* 166* 202* 118*  BUN 40* 37* 28* 30* 34*  CREATININE 0.68 0.85 0.56 0.60 0.67  CALCIUM 8.6* 9.0 8.7* 8.8* 9.2  MG  --   --   --  2.6* 2.7*   Liver Function Tests: Recent Labs  Lab 01/18/22 0830 01/18/22 1136 01/18/22 2003  AST 65* 50* 66*  ALT 179* 161* 158*  ALKPHOS 86 75 79  BILITOT 1.2 1.4* 1.2  PROT 6.8 6.2* 6.5  ALBUMIN 3.4* 3.1* 3.1*   No results for input(s): LIPASE, AMYLASE in the last 168 hours. No results for input(s): AMMONIA in the last 168 hours. CBC: Recent Labs  Lab 01/18/22 0830 01/18/22 1136 01/18/22 2003 01/19/22 0527 01/20/22 0545 01/21/22 0354  WBC 18.7* 17.8* 18.6* 19.5* 17.8* 18.4*  NEUTROABS 16.5*  --   --   --   --   --   HGB 16.1* 14.7 14.9 13.8 14.1 14.2  HCT 46.1* 43.4 43.7 39.5 40.2 41.7  MCV 95.1  96.2 95.4 94.5 93.5 94.8  PLT 152 124* 132* 115* 82* 77*   Cardiac Enzymes: No results for input(s): CKTOTAL, CKMB, CKMBINDEX, TROPONINI in the last 168 hours. BNP: Invalid input(s): POCBNP CBG: Recent Labs  Lab 01/20/22 1158 01/20/22 1607 01/20/22 2035 01/21/22 0751 01/21/22 1212  GLUCAP 189* 301* 112* 150* 196*   D-Dimer No results for input(s): DDIMER in the last 72 hours. Hgb A1c Recent Labs    01/18/22 2003 01/20/22 1700  HGBA1C 7.1* 7.3*   Lipid Profile No results for input(s): CHOL, HDL, LDLCALC, TRIG, CHOLHDL, LDLDIRECT in the last 72 hours. Thyroid function studies Recent Labs    01/18/22 2003  TSH <0.010*   Anemia work up No results for input(s): VITAMINB12, FOLATE, FERRITIN, TIBC, IRON, RETICCTPCT in the last 72 hours. Urinalysis    Component Value Date/Time   COLORURINE YELLOW (A) 01/18/2022 1136   APPEARANCEUR CLEAR (A) 01/18/2022 1136   APPEARANCEUR Clear 12/01/2013 1439  LABSPEC 1.020 01/18/2022 1136   LABSPEC 1.005 12/01/2013 1439   PHURINE 5.0 01/18/2022 1136   GLUCOSEU >=500 (A) 01/18/2022 1136   GLUCOSEU Negative 12/01/2013 1439   HGBUR NEGATIVE 01/18/2022 1136   BILIRUBINUR NEGATIVE 01/18/2022 1136   BILIRUBINUR Negative 12/01/2013 1439   KETONESUR NEGATIVE 01/18/2022 1136   PROTEINUR NEGATIVE 01/18/2022 1136   NITRITE NEGATIVE 01/18/2022 1136   LEUKOCYTESUR NEGATIVE 01/18/2022 1136   LEUKOCYTESUR 1+ 12/01/2013 1439   Sepsis Labs Invalid input(s): PROCALCITONIN,  WBC,  LACTICIDVEN Microbiology Recent Results (from the past 240 hour(s))  Blood culture (single)     Status: None (Preliminary result)   Collection Time: 01/18/22 11:18 AM   Specimen: BLOOD  Result Value Ref Range Status   Specimen Description BLOOD BLOOD RIGHT WRIST  Final   Special Requests   Final    BOTTLES DRAWN AEROBIC AND ANAEROBIC Blood Culture results may not be optimal due to an inadequate volume of blood received in culture bottles   Culture   Final    NO  GROWTH 3 DAYS Performed at [facility], [address], [place], [place] [address]    Report Status PENDING  Incomplete  Urine Culture     Status: None   Collection Time: 01/18/22 11:36 AM   Specimen: Urine, Clean Catch  Result Value Ref Range Status   Specimen Description   Final    URINE, CLEAN CATCH Performed at [facility], [address], [place], [place] [address]    Special Requests   Final    NONE Performed at [facility], [address], [place], [place] [address]    Culture   Final    NO GROWTH Performed at [facility], [place] [address], [place],  [address]    Report Status 01/19/2022 FINAL  Final     Total time spend on discharging this patient, including the last patient exam, discussing the hospital stay, instructions for ongoing care as it relates to all pertinent caregivers, as well as preparing the medical discharge records, prescriptions, and/or referrals as applicable, is 40 minutes.    Enzo Bi, MD  [facility] 01/21/2022, 12:41 PM

## 2022-01-21 NOTE — Progress Notes (Signed)
I spoke to patient significant other [name] regarding patient?s plan of care. Discussed that she?s a difficult situation where she?s having side effects of the treatment/steroids, and at the same time has progression disease. recommend proceeding with PT

## 2022-01-21 NOTE — Progress Notes (Signed)
Nichole Park   DOB:[birth year]   ZD#:[id number]   ? ?Subjective: Patient denies any worsening pain.  Continues to complain of fatigue.  She is sitting in the bed; no acute distress.  ? ?Objective:  ?Vitals:  ? 01/21/22 0818 01/21/22 0820  ?BP: (!) 146/100 (!) 152/97  ?Pulse: 100 (!) 101  ?Resp: 18 18  ?Temp: (!) 97.4 ?F (36.3 ?C)   ?SpO2: 98% 98%  ?  ? ?Intake/Output Summary (Last 24 hours) at 01/21/2022 1844 ?Last data filed at 01/21/2022 1440 ?Gross per 24 hour  ?Intake 360 ml  ?Output 400 ml  ?Net -40 ml  ? ? ?Physical Exam ?Vitals and nursing note reviewed.  ?HENT:  ?   Head: Normocephalic and atraumatic.  ?   Mouth/Throat:  ?   Pharynx: Oropharynx is clear.  ?Eyes:  ?   Extraocular Movements: Extraocular movements intact.  ?   Pupils: Pupils are equal, round, and reactive to light.  ?Cardiovascular:  ?   Rate and Rhythm: Normal rate and regular rhythm.  ?Pulmonary:  ?   Comments: Decreased breath sounds bilaterally.  ?Abdominal:  ?   Palpations: Abdomen is soft.  ?Musculoskeletal:     ?   General: Normal range of motion.  ?   Cervical back: Normal range of motion.  ?Skin: ?   General: Skin is warm.  ?Neurological:  ?   General: No focal deficit present.  ?   Mental Status: She is alert and oriented to person, place, and time.  ?Psychiatric:     ?   Behavior: Behavior normal.     ?   Judgment: Judgment normal.  ? ?  ?Labs:  ?Lab Results  ?Component Value Date  ? WBC 18.4 (H) 01/21/2022  ? HGB 14.2 01/21/2022  ? HCT 41.7 01/21/2022  ? MCV 94.8 01/21/2022  ? PLT 77 (L) 01/21/2022  ? NEUTROABS 16.5 (H) 01/18/2022  ? ? ?Lab Results  ?Component Value Date  ? NA 136 01/21/2022  ? K 4.6 01/21/2022  ? CL 105 01/21/2022  ? CO2 25 01/21/2022  ? ? ?Studies:  ?MR BRAIN W WO CONTRAST ? ?Result Date: 01/20/2022 ?CLINICAL DATA:  Initial evaluation for brain CNS neoplasm. EXAM: MRI HEAD WITHOUT AND WITH CONTRAST TECHNIQUE: Multiplanar, multiecho pulse sequences of the brain and surrounding structures were obtained without and with  intravenous contrast. CONTRAST:  11mL GADAVIST GADOBUTROL 1 MMOL/ML IV SOLN COMPARISON:  Prior MRI from 12/31/2021. FINDINGS: Brain: Previously identified intracranial metastatic deposit positioned at the left parietal lobe again seen. Overall, lesion is not significantly changed in overall size measuring 1.7 x 1.7 x 1.9 cm. However, the lesion demonstrates diminished peripheral enhancement as compared to previous exam. Additionally, associated vasogenic edema within the adjacent left parietal region is also improved. Improved mass effect on the left lateral ventricle without significant midline shift. Overall, changes are consistent with interval response to therapy. No new lesions identified. No acute or subacute infarct. Gray-white matter differentiation otherwise maintained. Underlying mild chronic microvascular ischemic disease noted. No acute intracranial hemorrhage. Small focus of chronic hemosiderin staining at the left frontal lobe noted, stable. No other new acute or chronic intracranial blood products. No other mass lesion or mass effect. No hydrocephalus or extra-axial fluid collection. Pituitary gland suprasellar region normal. Midline structures intact and normal. No other abnormal enhancement. Vascular: Major intracranial vascular flow voids are maintained. Skull and upper cervical spine: Craniocervical junction with normal limits. Bone marrow signal intensity normal. No focal marrow replacing lesion. No scalp soft  tissue abnormality. Sinuses/Orbits: Globes and orbital soft tissues within normal limits. Mild scattered mucosal thickening noted about the sphenoid ethmoidal and maxillary sinuses. Small bilateral mastoid effusions noted. Other: None. IMPRESSION: 1. Similar size of left parietal metastatic lesion, but with decreased enhancement and associated vasogenic edema, consistent with interval response to therapy. Secondary improved mass effect within the left parietal region as compared to previous.  No new lesions identified. 2. No other new or acute intracranial abnormality. Electronically Signed   By: Jeannine Boga M.D.   On: 01/20/2022 01:50   ? 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 ?# Generalized weakness-multifactorial secondary to hyperglycemia/hyper thyroidism: Continue dexamethasone 2 mg twice daily.  Given improvement of edema on CT scan brain.  Defer to hospitalist service regarding blood glucose control. ?  ?#Brain metastases : [April 24th MRI Brain]-thin slice MRI of the brain [5/13]-shows improvement of the edema continued lesions noted in the brain.  Discussed with Dr. Donella Stade.  We will plan SBRT. ? 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  After resolution of acute issues patient will need change of systemic therapy. ? ?#Debility: Steroid myopathy.  S/p physical therapy evaluation.  Recommend physical therapy at home. ? ?# Discussed with the patient and her significant other, [name] in detail.  ? ? ? ? ?Cammie Sickle, MD ?01/21/2022  6:44 PM ?  ?

## 2022-01-21 NOTE — Progress Notes (Signed)
Nichole Park to be D/C'd Home with [facility] per MD order.  Discussed prescriptions and follow up appointments with the patient and boyfriend. Prescriptions given to patient, medication list explained in detail. Pt verbalized understanding. ? ?Allergies as of 01/21/2022   ? ?   Reactions  ? Diazepam Other (See Comments)  ? Very high emotionally and very low emotionally, hyperventilate and passes out ?Very high emotionally and very low emotionally, hyperventilate and passes out  ? Keytruda [pembrolizumab] Rash  ? Metoprolol   ? Tapazole [methimazole]   ? ?  ? ?  ?Medication List  ?  ? ?STOP taking these medications   ? ?levothyroxine 150 MCG tablet ?Commonly known as: Synthroid ?  ?meclizine 25 MG tablet ?Commonly known as: ANTIVERT ?  ? ?  ? ?TAKE these medications   ? ?bismuth subsalicylate 374 MO/70BE suspension ?Commonly known as: PEPTO BISMOL ?Take 30 mLs by mouth every 6 (six) hours as needed. ?  ?dexamethasone 2 MG tablet ?Commonly known as: DECADRON ?Take 1 tablet (2 mg total) by mouth 2 (two) times daily. ?What changed: Another medication with the same name was removed. Continue taking this medication, and follow the directions you see here. ?  ?diltiazem 180 MG 24 hr capsule ?Commonly known as: CARDIZEM CD ?Take 1 capsule (180 mg total) by mouth daily. ?  ?folic acid 1 MG tablet ?Commonly known as: FOLVITE ?Take 1 tablet (1 mg total) by mouth once daily. ?  ?insulin glargine 100 UNIT/ML Solostar Pen ?Commonly known as: LANTUS ?Inject 10 Units into the skin at bedtime. ?  ?Insulin Pen Needle 31G X 6 MM Misc ?1 Units by Does not apply route at bedtime. ?  ?Multi-Vitamins Tabs ?Take 1 tablet by mouth daily. ?  ?ondansetron 4 MG tablet ?Commonly known as: ZOFRAN ?Take 2 tablets (8mg  total) by mouth once every 8 hours as needed for nausea and vomiting. ?  ?TURMERIC PO ?Take 1 tablet by mouth daily. ?  ? ?  ? ? ?Vitals:  ? 01/21/22 0818 01/21/22 0820  ?BP: (!) 146/100 (!) 152/97  ?Pulse: 100 (!) 101  ?Resp: 18 18  ?Temp:  (!) 97.4 ?F (36.3 ?C)   ?SpO2: 98% 98%  ? ? ?Skin clean, dry and intact without evidence of skin break down, no evidence of skin tears noted. IV catheter discontinued intact. Site without signs and symptoms of complications. Dressing and pressure applied. Pt denies pain at this time. No complaints noted. ? ?An After Visit Summary was printed and given to the patient. ?Patient escorted via [place], and D/C home via private auto. ? ?Nichole Park  ?

## 2022-01-21 NOTE — Plan of Care (Signed)
?  Problem: Clinical Measurements: ?Goal: Ability to maintain clinical measurements within normal limits will improve ?Outcome: Progressing ?  ?Problem: Clinical Measurements: ?Goal: Respiratory complications will improve ?Outcome: Progressing ?Goal: Cardiovascular complication will be avoided ?Outcome: Progressing ?  ?

## 2022-01-22 ENCOUNTER — Ambulatory Visit
Admission: RE | Admit: 2022-01-22 | Discharge: 2022-01-22 | Disposition: A | Discharge status: Home / Self Care | Payer: Medicaid Other | Source: Ambulatory Visit | Attending: Radiation Oncology | Admitting: Radiation Oncology

## 2022-01-22 DIAGNOSIS — C3411 Malignant neoplasm of upper lobe, right bronchus or lung: Secondary | ICD-10-CM | POA: Diagnosis present

## 2022-01-22 DIAGNOSIS — Z510 Encounter for antineoplastic radiation therapy: Secondary | ICD-10-CM | POA: Insufficient documentation

## 2022-01-22 DIAGNOSIS — C7931 Secondary malignant neoplasm of brain: Secondary | ICD-10-CM | POA: Diagnosis present

## 2022-01-23 ENCOUNTER — Telehealth: Payer: Self-pay | Admitting: Internal Medicine

## 2022-01-23 ENCOUNTER — Inpatient Hospital Stay: Payer: Medicaid Other | Admitting: Occupational Therapy

## 2022-01-23 DIAGNOSIS — C3490 Malignant neoplasm of unspecified part of unspecified bronchus or lung: Secondary | ICD-10-CM

## 2022-01-23 DIAGNOSIS — Z510 Encounter for antineoplastic radiation therapy: Secondary | ICD-10-CM | POA: Diagnosis not present

## 2022-01-23 DIAGNOSIS — M6281 Muscle weakness (generalized): Secondary | ICD-10-CM

## 2022-01-23 LAB — CULTURE, BLOOD (SINGLE): Culture: NO GROWTH

## 2022-01-23 NOTE — Addendum Note (Signed)
Addended by: Vanice Sarah on: 01/23/2022 12:56 PM ? ? Modules accepted: Orders ? ?

## 2022-01-23 NOTE — Telephone Encounter (Signed)
Please have pt see me on 5/25 at 8:30- MD labs- cbc/cmp; thyroid panel with TSH/port. Thanks ?GB ?

## 2022-01-23 NOTE — Telephone Encounter (Signed)
Please schedule and inform patient of appt details.  Lab orders entered. ?

## 2022-01-23 NOTE — Therapy (Signed)
[place] [facility] ?[address], [address] ?[place], [place], [address] ?Phone: [phone]   Fax:  [phone] ? ?Occupational Therapy screen ? ?Patient Details  ?Name: Nichole Park ?MRN: [id number] ?Date of Birth: 1961-08-03 ?No data recorded ? ?Encounter Date: 01/23/2022 ? ? OT End of Session - 01/23/22 1147   ? ? Visit Number 0   ? ?  ?  ? ?  ? ? ?Past Medical History:  ?Diagnosis Date  ? Graves disease   ? Malignant pericardial effusion   ? Paroxysmal atrial fibrillation (HCC)   ? Primary lung adenocarcinoma ([place])   ? ? ?Past Surgical History:  ?Procedure Laterality Date  ? IR CV LINE INJECTION  06/19/2021  ? IR IMAGING GUIDED PORT INSERTION  04/27/2021  ? PERICARDIOCENTESIS N/A 03/27/2021  ? Procedure: PERICARDIOCENTESIS;  Surgeon: Nelva Bush, MD;  Location: [facility];  Service: Cardiovascular;  Laterality: N/A;  ? TUBAL LIGATION    ? ? ?There were no vitals filed for this visit. ? ? Subjective Assessment - 01/23/22 1144   ? ? Subjective  The chemo really hit me hard.  About 3 to 4 weeks ago I was still walking some without a walker.  I was still on a walker prior to this last hospitalization but now using a wheelchair more because my 1 leg gives out.   ? Currently in Pain? No/denies   ? ?  ?  ? ?  ? ? ? ?DR Rogue Bussing note 01/21/22: ?Cancer of upper lobe of right lung ([place]) ?61 year old female patient with stage IV lung cancer with brain metastases currently admitted to hospital for generalized weakness/hyperglycemia: ?  ?# Generalized weakness-multifactorial secondary to hyperglycemia/hyper thyroidism: Continue dexamethasone 2 mg twice daily.  Given improvement of edema on CT scan brain.  Defer to hospitalist service regarding blood glucose control. ?  ?#Brain metastases : [April 24th MRI Brain]-thin slice MRI of the brain [5/13]-shows improvement of the edema continued lesions noted in the brain.  Discussed with Dr. Donella Stade.  We will plan SBRT. ?  ?#  Stage IV-lung cancer-adenocarcinoma-most recently on Alimta-Keytruda maintenance; MAY 2023- CT CAP- progressive disease.  After resolution of acute issues patient will need change of systemic therapy. ?  ?#Debility: Steroid myopathy.  S/p physical therapy evaluation.  Recommend physical therapy at home. ?  ?# Discussed with the patient and her significant other, [name] in detail.  ? ? ? OT SCREEN 01/23/22: ?Patient arrived in wheelchair with significant other, [name].  They report since discharge 2 days ago patient using mostly the wheelchair at home because of her leg giving out.  Up to 3 to 4 weeks ago patient was still walking without a walker.  And prior to last hospitalization was using a walker.  Reports chemo made her weaker and weaker staying more in bed sleeping a lot.. But she is now trying to stay awake during day and night time sleeping. ?Fatigue level today 7/10. ?Sit to stand patient needed  mod assist out of chair but min A with pillow in chair and after education for sit<> stand.  Standing holding onto counter patient was able to march for 30 seconds.  Was able holding onto counter doing hip abduction left and right 5 reps each. ?Patient right lower extremity weaker than the left.  Proximal larger muscles weaker than knee extension/ flexion. ?Discussed with patient and significant other about importance of staying active during chemo to maintain muscle strength and balance.  Especially with her  having neuropathy in the feet too. ?Discussed with patient exercising during chemo-breaking it up in shorter manageable exercise sessions or activity sessions. ?Recommended 4 x 5 to 7 minutes at home until getting in with outpatient therapy.  Per Merrily Pew, palliative care home health is not covered under insurance. ?Significant other, can bring her to therapy in outpatient. ?Message send to therapy dept  ?Pt can benefit from skilled PT services  ?  ? ? ? ? ? ? ? ? ? ? ? ? ? ? ? ? ? ? ? ? ? ? ? ? ? ? ? ? ? ? ?Patient  will benefit from skilled therapeutic intervention in order to improve the following deficits and impairments:   ?  ?  ?  ? ? ?Visit Diagnosis: ?Muscle weakness (generalized) ? ? ? ?Problem List ?Patient Active Problem List  ? Diagnosis Date Noted  ? Electrolyte abnormality 01/19/2022  ? Hyperglycemia 01/18/2022  ? Hypothyroidism 01/01/2022  ? Paroxysmal atrial fibrillation ([place]) 01/01/2022  ? Lung cancer metastatic to brain [facility]) 01/01/2022  ? Metastatic lung cancer (metastasis from lung to other site) [facility]) 12/31/2021  ? Cancer of upper lobe of right lung ([place]) 04/10/2021  ? Lung mass   ? Palliative care encounter   ? PAF (paroxysmal atrial fibrillation) ([place]) 03/30/2021  ? COPD (chronic obstructive pulmonary disease) ([place]) 03/30/2021  ? Hyperthyroidism 03/29/2021  ? Malignant pericardial effusion   ? Tobacco use   ? Cardiac tamponade 03/27/2021  ? Cardiac/pericardial tamponade 03/27/2021  ? Hypertension 03/22/2014  ? ? ?Rosalyn Gess, OTR/L,CLT ?01/23/2022, 11:48 AM ? ?[place] [facility] ?[address], [address] ?[place], [place], [address] ?Phone: [phone]   Fax:  [phone] ? ?Name: Nichole Park ?MRN: [id number] ?Date of Birth: [birth year] ? ?

## 2022-01-23 NOTE — Addendum Note (Signed)
Addended by: Vanice Sarah on: 01/23/2022 01:25 PM ? ? Modules accepted: Orders ? ?

## 2022-01-24 ENCOUNTER — Ambulatory Visit
Admission: RE | Admit: 2022-01-24 | Discharge: 2022-01-24 | Disposition: A | Discharge status: Home / Self Care | Payer: Medicaid Other | Source: Ambulatory Visit | Attending: Radiation Oncology | Admitting: Radiation Oncology

## 2022-01-24 ENCOUNTER — Other Ambulatory Visit: Payer: Self-pay

## 2022-01-24 ENCOUNTER — Ambulatory Visit: Payer: Medicaid Other

## 2022-01-24 DIAGNOSIS — Z510 Encounter for antineoplastic radiation therapy: Secondary | ICD-10-CM | POA: Diagnosis not present

## 2022-01-24 LAB — RAD ONC ARIA SESSION SUMMARY
Course Elapsed Days: 0
Plan Fractions Treated to Date: 1
Plan Prescribed Dose Per Fraction: 18 Gy
Plan Total Fractions Prescribed: 1
Plan Total Prescribed Dose: 18 Gy
Reference Point Dosage Given to Date: 18 Gy
Reference Point Session Dosage Given: 18 Gy
Session Number: 1

## 2022-01-28 ENCOUNTER — Ambulatory Visit: Payer: Medicaid Other | Admitting: Radiation Oncology

## 2022-01-28 ENCOUNTER — Ambulatory Visit: Payer: Medicaid Other | Attending: Internal Medicine

## 2022-01-28 DIAGNOSIS — C3490 Malignant neoplasm of unspecified part of unspecified bronchus or lung: Secondary | ICD-10-CM | POA: Insufficient documentation

## 2022-01-28 DIAGNOSIS — R262 Difficulty in walking, not elsewhere classified: Secondary | ICD-10-CM | POA: Diagnosis present

## 2022-01-28 DIAGNOSIS — M6281 Muscle weakness (generalized): Secondary | ICD-10-CM | POA: Diagnosis present

## 2022-01-28 NOTE — Therapy (Addendum)
[facility] [address] [place], [address] Phone: [phone]   Fax:  [phone]  Physical Therapy Evaluation  Patient Details  Name: Nichole Park MRN: [id number] Date of Birth: 1960-12-14 Referring Provider (PT): Charlaine Dalton, MD   Encounter Date: 01/28/2022   PT End of Session - 01/28/22 1801     Visit Number 1    Number of Visits 13    Date for PT Re-Evaluation 03/25/22    Authorization Type [place] - Visit Number 1    Progress Note Due on Visit 10    PT Start Time 1430    PT Stop Time 1515    PT Time Calculation (min) 45 min    Activity Tolerance Patient limited by fatigue    Behavior During Therapy Agitated;Anxious             Past Medical History:  Diagnosis Date   Graves disease    Malignant pericardial effusion    Paroxysmal atrial fibrillation ([place])    Primary lung adenocarcinoma [facility])     Past Surgical History:  Procedure Laterality Date   IR CV LINE INJECTION  06/19/2021   IR IMAGING GUIDED PORT INSERTION  04/27/2021   PERICARDIOCENTESIS N/A 03/27/2021   Procedure: PERICARDIOCENTESIS;  Surgeon: Nelva Bush, MD;  Location: [facility];  Service: Cardiovascular;  Laterality: N/A;   TUBAL LIGATION      There were no vitals filed for this visit.    Subjective Assessment - 01/28/22 1438     Subjective Pt was diagnosed with lung cancer back in July of 2022 and has been treated since that time.  [name] caregiver/boyfriend notes that she was doing ok until she was treated with steroids and has been having difficulty since that point in time approximately 1.5-2 months ago.  [name] boyfriend notes that she has been needing more and more assistance with daily tasks and is not able to mobilize well.  Boyfriend is hoping that the steroids will be reduced in the upcoming weeks so that she can feel and function better.    Patient is  accompained by: Family member   Boyfriend   Limitations Standing;Walking;House hold activities    How long can you stand comfortably? 3-4 minutes with holding onto something    How long can you walk comfortably? Pt unable to walk independently with walker.    Currently in Pain? No/denies    Pain Score 0-No pain                [facility] PT Assessment - 01/28/22 1440       Assessment   Medical Diagnosis M62.81 (ICD-10-CM) - Muscle weakness (generalized)  C34.90 (ICD-10-CM) - Malignant neoplasm of lung, unspecified laterality, unspecified part of lung [facility])    Referring Provider (PT) Charlaine Dalton, MD    Onset Date/Surgical Date 01/07/22    Hand Dominance Right    Prior Therapy No                      SUBJECTIVE  Chief complaint:  Generalized deconditioning and weakness due to cancer treatments and steroids.  History:  Pt was diagnosed with lung cancer back in July of 2022 and has been treated since that time.  [name] caregiver/boyfriend notes that she was doing ok until she was treated with steroids and has been having difficulty since that point in time approximately 1.5-2 months ago.  [name] boyfriend notes that she has been needing more and more assistance with daily tasks and is not able to mobilize well.  Boyfriend is hoping that the steroids will be reduced in the upcoming weeks so that she can feel and function better.      Red flags (bowel/bladder changes, saddle paresthesia, personal history of cancer, chills/fever, night sweats, unrelenting pain): Pt diagnosed with lung cancer and 2-3 spots in the head.  Pt was having to time her voiding trips, but has since resolved in the past weeks.  Referring Dx:  M62.81 (ICD-10-CM) - Muscle weakness (generalized) C34.90 (ICD-10-CM) - Malignant neoplasm of lung, unspecified laterality, unspecified part of lung [facility])  Referring Provider:  Charlaine Dalton  Recent changes in overall health/medication: No  Directional  pattern for falls: straight down due to leg giving way.  Prior history of physical therapy for balance: None  Follow-up appointment with MD: Yes  Dominant hand: right  Falls in the last 6 months: Yes   Occupational demands: Not currently working  Hobbies: Pt reports she enjoys gardening and grandkids.  Goals: Be able to walk again.  Pt notes being able to walk last in April around Easter.    OBJECTIVE  Musculoskeletal Tremor: Absent Bulk: Normal Tone: Normal, no clonus   Posture Pt with forward rolled shoulders and poor sitting posture.   Gait Pt unable to attempt due to being unable to stand at this time.   Strength R/L 3+/3+ Hip flexion 3+/3+ Hip abduction 3+/3+ Hip adduction 3+/3+ Knee extension 3+/3+ Knee flexion 3+/3+ Ankle Dorsiflexion    NEUROLOGICAL:  Mental Status Patient is oriented to person, place and time.  Recent memory is intact.  Remote memory is intact.  Attention span and concentration are intact.  Expressive speech is intact.  Patient's fund of knowledge is within normal limits for educational level.   Cranial Nerves Visual acuity and visual fields are intact  Extraocular muscles are intact  Facial sensation is intact bilaterally  Facial strength is intact bilaterally  Hearing is normal as tested by gross conversation Palate elevates midline, normal phonation  Shoulder shrug strength is intact  Tongue protrudes midline    Sensation Grossly intact to light touch bilateral UEs/LEs as determined by testing dermatomes C2-T2/L2-S2 respectively. Proprioception and hot/cold testing deferred on this date    Coordination/Cerebellar Finger to Nose: WNL Heel to Shin: WNL Rapid alternating movements: WNL Finger Opposition: WNL    FUNCTIONAL OUTCOME MEASURES   Results Comments  TUG Unable to perform   5TSTS Unable to perform   ABC Scale 3.125%       ASSESSMENT Clinical Impression: Pt is a pleasant 61 year-old female  referred for difficulty with generalized deconditioning and weakness. PT examination reveals deficits of inability to mobilize currently when she was able to do so <3 weeks prior to evaluation, and significant weakness of the LE's during evaluation.  Pt unable to stand without assistance from therapist or significant other.   Pt presents with deficits in strength, gait and balance and will benefit from skilled PT services to address these deficits, to improve balance, and decrease risk for future falls.    PLAN Next Visit: assign HEP and check on performance with exercises given by Gwenette Greet at Cancer center.  Check on grogginess and if she is feeling better from reducing the steroids. HEP: Not given at this time.     Objective measurements completed on examination: See above findings.           PT  Education - 01/28/22 1441     Education Details pt educated on HEP in order to perform at home.    Person(s) Educated Associate Professor)   Boyfriend   Methods Explanation;Demonstration    Comprehension Verbalized understanding;Returned demonstration              PT Short Term Goals - 01/28/22 1810       PT SHORT TERM GOAL #1   Title Pt will be independent with HEP in order to improve strength and balance in order to decrease fall risk and improve function at home and work.    Baseline HEP: Not given at this time.    Time 4    Period Weeks    Status New    Target Date 02/25/22               PT Long Term Goals - 01/28/22 1811       PT LONG TERM GOAL #1   Title Pt will decrease TUG to below 14 seconds/decrease in order to demonstrate decreased fall risk.    Baseline Eval: TUG not performed due to inability to stand.    Time 8    Period Weeks    Status New    Target Date 03/25/22      PT LONG TERM GOAL #2   Title Pt will improve ABC by at least 13% in order to demonstrate clinically significant improvement in balance confidence.    Baseline EVAL: 3.125%    Time 8     Period Weeks    Status New    Target Date 03/25/22      PT LONG TERM GOAL #3   Title Pt will decrease 5TSTS by at least 3 seconds in order to demonstrate clinically significant improvement in LE strength.    Baseline EVAL: Unable to perform    Time 8    Period Weeks    Status New    Target Date 03/25/22      PT LONG TERM GOAL #4   Title Patient will demonstrate improved function as evidenced by a score of 47 on FOTO measure for full participation in activities at home and in the community.    Baseline EVAL:  FOTO: 34    Time 8    Period Weeks    Status New    Target Date 03/25/22                    Plan - 01/28/22 1804     Clinical Impression Statement Pt is a pleasant 61 year-old female referred for difficulty with generalized deconditioning and weakness. PT examination reveals deficits of inability to mobilize currently when she was able to do so <3 weeks prior to evaluation, and significant weakness of the LE's during evaluation.  Pt unable to stand without assistance from therapist or significant other.   Pt presents with deficits in strength, gait and balance and will benefit from skilled PT services to address these deficits, to improve balance, and decrease risk for future falls.    Personal Factors and Comorbidities Age;Comorbidity 3+    Comorbidities Cancer, COPD, HTN    Examination-Activity Limitations Bathing;Bed Mobility;Dressing;Hygiene/Grooming;Squat;Stairs;Stand;Toileting;Transfers    Stability/Clinical Decision Making Evolving/Moderate complexity    Clinical Decision Making High    Rehab Potential Fair    PT Frequency 2x / week    PT Duration 8 weeks    PT Treatment/Interventions ADLs/Self Care Home Management;DME Instruction;Gait training;Stair training;Functional mobility training;Therapeutic activities;Therapeutic exercise;Balance training;Neuromuscular re-education;Patient/family education;Manual techniques;Passive range  of motion    PT Next Visit  Plan assign HEP and check on performance with exercises given by Gwenette Greet at Cancer center.  Check on grogginess and if she is feeling better from reducing the steroids.    PT Home Exercise Plan give at next visit    Consulted and Agree with Plan of Care Patient;Family member/caregiver    Family Member Consulted Significant Other.             Patient will benefit from skilled therapeutic intervention in order to improve the following deficits and impairments:  Abnormal gait, Decreased activity tolerance, Decreased balance, Decreased endurance, Decreased mobility, Decreased safety awareness, Decreased strength, Difficulty walking, Impaired perceived functional ability  Visit Diagnosis: Difficulty in walking, not elsewhere classified  Muscle weakness (generalized)     Problem List Patient Active Problem List   Diagnosis Date Noted   Electrolyte abnormality 01/19/2022   Hyperglycemia 01/18/2022   Hypothyroidism 01/01/2022   Paroxysmal atrial fibrillation ([place]) 01/01/2022   Lung cancer metastatic to brain ([place]) 01/01/2022   Metastatic lung cancer (metastasis from lung to other site) [facility]) 12/31/2021   Cancer of upper lobe of right lung ([place]) 04/10/2021   Lung mass    Palliative care encounter    PAF (paroxysmal atrial fibrillation) ([place]) 03/30/2021   COPD (chronic obstructive pulmonary disease) ([place]) 03/30/2021   Hyperthyroidism 03/29/2021   Malignant pericardial effusion    Tobacco use    Cardiac tamponade 03/27/2021   Cardiac/pericardial tamponade 03/27/2021   Hypertension 03/22/2014    Gwenlyn Saran, PT, DPT 01/28/22, 6:24 PM   [facility] [address], [place], [address] Phone: [phone]   Fax:  [phone]  Name: Nichole Park MRN: [id number] Date of Birth: July 04, 1961

## 2022-01-29 ENCOUNTER — Telehealth: Payer: Self-pay

## 2022-01-29 NOTE — Telephone Encounter (Signed)
Received call back from [facility] and states that the referral needs to be sent to patient's insurance [employer] and provided contact number [phone].  Will call tomorrow.

## 2022-01-29 NOTE — Telephone Encounter (Signed)
[facility] is listed as a covered facility to provide PCS/Home Health.  Message left for [employer] to return my call for clarification of services available for patient.

## 2022-01-29 NOTE — Telephone Encounter (Signed)
Received a form for personal care services from [employer].  Form was completed and faxed back to [employer].  The form was received back stating they are unable to process due to "other--Beneficiary is enrolled in a [place].  Please contact Health Plan for PCS".    I called patient's insurance to find out what company would be covered for personal care servives.  I was provided with website:  [email or web address] to search for covered providers.  Went on the website that provided the following facilities:  [facility], [facility] [phone]), [place] [phone].

## 2022-01-30 DIAGNOSIS — C3490 Malignant neoplasm of unspecified part of unspecified bronchus or lung: Secondary | ICD-10-CM

## 2022-01-30 NOTE — Addendum Note (Signed)
Addended by: Christie Nottingham on: 01/30/2022 10:34 AM   Modules accepted: Orders

## 2022-01-30 NOTE — Telephone Encounter (Signed)
Patient's daughter informed.  MyChart message sent with contact information as requested by Summer.

## 2022-01-30 NOTE — Telephone Encounter (Signed)
Spoke to rapid response team dept of patient's insurance [phone]).  [place] referral needs to be submitted by patient's case manager but patient does not have an assigned case Freight forwarder.  Patient's daughter, Summer, is notified and given rapid response team contact number for them to call to get assigned case manager as well as need for [facility] assessment.

## 2022-01-31 ENCOUNTER — Inpatient Hospital Stay: Payer: Medicaid Other

## 2022-01-31 ENCOUNTER — Encounter: Payer: Self-pay | Admitting: Internal Medicine

## 2022-01-31 ENCOUNTER — Inpatient Hospital Stay (HOSPITAL_BASED_OUTPATIENT_CLINIC_OR_DEPARTMENT_OTHER): Payer: Medicaid Other | Admitting: Internal Medicine

## 2022-01-31 ENCOUNTER — Ambulatory Visit: Payer: Medicaid Other | Admitting: Radiation Oncology

## 2022-01-31 VITALS — BP 120/96 | HR 107 | Temp 96.8°F | Resp 16 | Ht 61.5 in | Wt 126.3 lb

## 2022-01-31 DIAGNOSIS — C3411 Malignant neoplasm of upper lobe, right bronchus or lung: Secondary | ICD-10-CM

## 2022-01-31 DIAGNOSIS — D696 Thrombocytopenia, unspecified: Secondary | ICD-10-CM | POA: Diagnosis not present

## 2022-01-31 DIAGNOSIS — Z95828 Presence of other vascular implants and grafts: Secondary | ICD-10-CM

## 2022-01-31 DIAGNOSIS — C3490 Malignant neoplasm of unspecified part of unspecified bronchus or lung: Secondary | ICD-10-CM

## 2022-01-31 LAB — COMPREHENSIVE METABOLIC PANEL
ALT: 96 U/L — ABNORMAL HIGH (ref 0–44)
AST: 33 U/L (ref 15–41)
Albumin: 3 g/dL — ABNORMAL LOW (ref 3.5–5.0)
Alkaline Phosphatase: 75 U/L (ref 38–126)
Anion gap: 9 (ref 5–15)
BUN: 25 mg/dL — ABNORMAL HIGH (ref 6–20)
CO2: 22 mmol/L (ref 22–32)
Calcium: 9.2 mg/dL (ref 8.9–10.3)
Chloride: 97 mmol/L — ABNORMAL LOW (ref 98–111)
Creatinine, Ser: 0.59 mg/dL (ref 0.44–1.00)
GFR, Estimated: >60 mL/min (ref >60)
Glucose, Bld: 416 mg/dL — ABNORMAL HIGH (ref 70–99)
Potassium: 4.8 mmol/L (ref 3.5–5.1)
Sodium: 128 mmol/L — ABNORMAL LOW (ref 135–145)
Total Bilirubin: 0.9 mg/dL (ref 0.3–1.2)
Total Protein: 6.3 g/dL — ABNORMAL LOW (ref 6.5–8.1)

## 2022-01-31 LAB — CBC WITH DIFFERENTIAL/PLATELET
Abs Immature Granulocytes: 0.12 10*3/uL — ABNORMAL HIGH (ref 0.00–0.07)
Basophils Absolute: 0.1 10*3/uL (ref 0.0–0.1)
Basophils Relative: 2 %
Eosinophils Absolute: 0 10*3/uL (ref 0.0–0.5)
Eosinophils Relative: 0 %
HCT: 43 % (ref 36.0–46.0)
Hemoglobin: 15.3 g/dL — ABNORMAL HIGH (ref 12.0–15.0)
Immature Granulocytes: 4 %
Lymphocytes Relative: 33 %
Lymphs Abs: 1 10*3/uL (ref 0.7–4.0)
MCH: 32.6 pg (ref 26.0–34.0)
MCHC: 35.6 g/dL (ref 30.0–36.0)
MCV: 91.5 fL (ref 80.0–100.0)
Monocytes Absolute: 0.2 10*3/uL (ref 0.1–1.0)
Monocytes Relative: 7 %
Neutro Abs: 1.6 10*3/uL — ABNORMAL LOW (ref 1.7–7.7)
Neutrophils Relative %: 54 %
Platelets: 78 10*3/uL — ABNORMAL LOW (ref 150–400)
RBC: 4.7 MIL/uL (ref 3.87–5.11)
RDW: 12.6 % (ref 11.5–15.5)
Smear Review: DECREASED
WBC: 2.9 10*3/uL — ABNORMAL LOW (ref 4.0–10.5)
nRBC: 0 % (ref 0.0–0.2)

## 2022-01-31 MED ORDER — METFORMIN HCL 500 MG PO TABS: 1000.0000 mg | ORAL_TABLET | Freq: Two times a day (BID) | ORAL | 1 refills | Status: AC

## 2022-01-31 MED ORDER — HEPARIN SOD (PORK) LOCK FLUSH 100 UNIT/ML IV SOLN
500.0000 [IU] | Freq: Once | INTRAVENOUS | Status: AC
  Administered 2022-01-31: 500 [IU] via INTRAVENOUS
  Filled 2022-01-31: qty 5

## 2022-01-31 MED ORDER — SODIUM CHLORIDE 0.9 % IV SOLN
INTRAVENOUS | Status: AC
  Filled 2022-01-31 (×2): qty 250

## 2022-01-31 MED ORDER — SODIUM CHLORIDE 0.9% FLUSH
10.0000 mL | Freq: Once | INTRAVENOUS | Status: AC
  Administered 2022-01-31: 10 mL via INTRAVENOUS
  Filled 2022-01-31: qty 10

## 2022-01-31 MED ORDER — METFORMIN HCL 500 MG PO TABS: 1000.0000 mg | ORAL_TABLET | Freq: Two times a day (BID) | ORAL | 1 refills | Status: DC

## 2022-01-31 NOTE — Patient Instructions (Signed)
#   RECOMMEND Metformin 1000 BID #  check Blood glucose 3 times a day  # HOLD nighttime insulin/[place] if blood glucose less than 150.

## 2022-01-31 NOTE — Progress Notes (Signed)
[place] NOTE  Patient Care Team: [facility] as PCP - General End, Harrell Gave, MD as PCP - Cardiology (Cardiology) Telford Nab, RN as Oncology Nurse Navigator Cammie Sickle, MD as Consulting Physician (Oncology)  CHIEF COMPLAINTS/PURPOSE OF CONSULTATION: Lung cancer  #  Oncology History Overview Note  # AUG 2022-stage IV adenocarcinoma of the lung [s/p pericardial effusion/tamponade]. MRI Brain AUG 16th, 2022- IMPRESSION: Multiple peripherally enhancing, centrally T1 hypointense lesions, with surrounding T2 hyperintense signal in the bilateral cerebral hemispheres, concerning for metastatic disease.    # AUG 17th 2022- CARBO-ALIMTA-Keytruda #1 x4 followed by Alimta Keytruda maintenance ; May 2023-CT scan chest and pelvis progression of disease  # Mammogram- FEB 2023-no lesions noted in the breasts; 1 to 2 cm lymph node left underarm;. LYMPH NODE, LEFT AXILLARY; ULTRASOUND-GUIDED CORE NEEDLE BIOPSY:  - POSITIVE FOR MALIGNANCY.  - METASTATIC ADENOCARCINOMA, COMPATIBLE WITH PULMONARY PRIMARY   # Repeat MRI brain- 11/22 Mildly increased size of a left parietal metastasis with increased, mild edema. Decreased size of 2 other metastases. 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  S/p pericardiocentesis.  NGS; PDL-1-0    Malignant pericardial effusion  03/28/2021 Initial Diagnosis   Malignant [place]ardial effusion ([facility]    04/25/2021 - 12/28/2021 Chemotherapy   Patient is on Treatment Plan : LUNG CARBOplatin / Pemetrexed / Pembrolizumab q21d Induction x 4 cycles / Maintenance Pemetrexed + Pembrolizumab      01/31/2022 -  Chemotherapy   Patient is on Treatment Plan : LUNG Docetaxel + Ramucirumab q21d       Cancer of [place] lobe  of right lung ([place]  04/10/2021 Initial Diagnosis   [place] lobe of right lung ([facility]    04/10/2021 Cancer Staging   Staging form: Lung, AJCC 8th Edition - Clinical: Stage IVA (cT2, cN2, cM1a) - Signed by BCammie Sickle MD on 04/10/2021    01/31/2022 -  Chemotherapy   Patient is on Treatment Plan : LUNG Docetaxel + Ramucirumab q21d        HISTORY OF PRESENTING ILLNESS: Accompanied by family; patient currently in wheelchair.  LBOSTON CATARINO89y.o.  female  stage IV adenocarcinoma the lung cancer with brain metastases is here currently on chemotherapy-immunotherapy is here for a follow up.  At last visit patient was admitted to hospital because of hyperglycemia.  Patient was discharged home after tapering the dose of steroids dexamethasone 2 mg twice daily.  Patient underwent SBRT to the brain lesions on 5/18.    Patient continues to complain of weakness.  Complains of falls.  No seizures.  Feels extremely fatigued.  Positive for nausea but no headaches.  Review of Systems  Constitutional:  Positive for malaise/fatigue. Negative for chills, diaphoresis and fever.  HENT:  Negative for nosebleeds and sore throat.   Eyes:  Negative for double vision.  Respiratory:  Positive for shortness of breath. Negative for cough, hemoptysis, sputum production and wheezing.   Cardiovascular:  Negative for chest pain, palpitations, orthopnea and leg swelling.  Gastrointestinal:  Negative for abdominal pain, blood in stool, constipation, diarrhea, heartburn, melena, nausea and vomiting.  Genitourinary:  Negative for dysuria, frequency and urgency.  Musculoskeletal:  Positive for myalgias. Negative for back pain and joint pain.  Skin: Negative.  Negative for itching and rash.  Neurological:  Negative for dizziness, tingling, focal weakness, weakness and headaches.  Endo/Heme/Allergies:  Does not bruise/bleed easily.  Psychiatric/Behavioral:  Negative for depression. The patient is not  nervous/anxious and does not have insomnia.     MEDICAL HISTORY:  Past Medical History:  Diagnosis Date   Graves disease    Malignant pericardial effusion    Paroxysmal atrial fibrillation ([place])    Primary lung adenocarcinoma ([place])     SURGICAL HISTORY: Past Surgical History:  Procedure Laterality Date   IR CV LINE INJECTION  06/19/2021   IR IMAGING GUIDED PORT INSERTION  04/27/2021   PERICARDIOCENTESIS N/A 03/27/2021   Procedure: PERICARDIOCENTESIS;  Surgeon: Nelva Bush, MD;  Location: [facility];  Service: Cardiovascular;  Laterality: N/A;   TUBAL LIGATION      SOCIAL HISTORY: Social History   Socioeconomic History   Marital status: Widowed    Spouse name: Not on file   Number of children: Not on file   Years of education: Not on file   Highest education level: Not on file  Occupational History   Not on file  Tobacco Use   Smoking status: Former    Packs/day: 1.00    Years: 40.00    Pack years: 40.00    Types: Cigarettes   Smokeless tobacco: Never  Vaping Use   Vaping Use: Never used  Substance and Sexual Activity   Alcohol use: No   Drug use: Not Currently    Types: Marijuana   Sexual activity: Not Currently  Other Topics Concern   Not on file  Social History Narrative   Not on file   Social Determinants of Health   Financial Resource Strain: High Risk   Difficulty of Paying Living Expenses: Very hard  Food Insecurity: Not on file  Transportation Needs: Not on file  Physical Activity: Not on file  Stress: Stress Concern Present   Feeling of Stress : Very much  Social Connections: Not on file  Intimate Partner Violence: Not on file    FAMILY HISTORY: Family History  Problem Relation Age of Onset   Peripheral Artery Disease Mother    Breast cancer Neg Hx     ALLERGIES:  is allergic to diazepam, keytruda [pembrolizumab], metoprolol, and tapazole [methimazole].  MEDICATIONS:  Current Outpatient Medications  Medication Sig  Dispense Refill   bismuth subsalicylate (PEPTO BISMOL) 262 MG/15ML suspension Take 30 mLs by mouth every 6 (six) hours as needed.     dexamethasone (DECADRON) 2 MG tablet Take 1 tablet (2 mg total) by mouth 2 (two) times daily. 60 tablet 0   diltiazem (CARDIZEM CD) 180 MG 24 hr capsule Take 1 capsule (180 mg total) by mouth daily. 90 capsule 3   folic acid (FOLVITE) 1 MG tablet Take 1 tablet (1 mg total) by mouth once daily. 90 tablet 1   insulin glargine (LANTUS) 100 UNIT/ML Solostar Pen Inject 10 Units into the skin at bedtime. 3 mL 2   Insulin Pen Needle 31G X 6 MM MISC 1 Units by Does not apply route at bedtime. 30 each 2   metFORMIN (GLUCOPHAGE) 500 MG tablet Take 2 tablets (1,000 mg total) by mouth 2 (two) times daily with a meal. 60 tablet 1   Multiple Vitamin (MULTI-VITAMINS) TABS Take 1 tablet by mouth daily.     ondansetron (ZOFRAN) 4 MG tablet Take 2 tablets (87m total) by mouth once every 8 hours as needed for nausea and vomiting. 80 tablet 1   TURMERIC PO Take 1 tablet by mouth  daily.     No current facility-administered medications for this visit.    Left axillary lymphadenopathy noted ~1 to 2 cm in size   PHYSICAL EXAMINATION: ECOG PERFORMANCE STATUS: 1 - Symptomatic but completely ambulatory  Vitals:   01/31/22 0900  BP: (!) 120/96  Pulse: (!) 107  Resp: 16  Temp: (!) 96.8 F (36 C)  SpO2: 95%    Filed Weights   01/31/22 0900  Weight: 126 lb 4.8 oz (57.3 kg)     Physical Exam Vitals and nursing note reviewed.  HENT:     Head: Normocephalic and atraumatic.     Mouth/Throat:     Pharynx: Oropharynx is clear.  Eyes:     Extraocular Movements: Extraocular movements intact.     Pupils: Pupils are equal, round, and reactive to light.  Cardiovascular:     Rate and Rhythm: Normal rate and regular rhythm.  Pulmonary:     Comments: Decreased breath sounds bilaterally.  Abdominal:     Palpations: Abdomen is soft.  Musculoskeletal:        General: Normal range  of motion.     Cervical back: Normal range of motion.  Skin:    General: Skin is warm.  Neurological:     General: No focal deficit present.     Mental Status: She is alert and oriented to person, place, and time.  Psychiatric:        Behavior: Behavior normal.        Judgment: Judgment normal.     LABORATORY DATA:  I have reviewed the data as listed Lab Results  Component Value Date   WBC 2.9 (L) 01/31/2022   HGB 15.3 (H) 01/31/2022   HCT 43.0 01/31/2022   MCV 91.5 01/31/2022   PLT 78 (L) 01/31/2022   Recent Labs    03/27/21 1635 03/28/21 0131 01/18/22 1136 01/18/22 2003 01/19/22 0527 01/20/22 0545 01/21/22 0354 01/31/22 0850  NA  --    < > 132* 131*   < > 134* 136 128*  K  --    < > 4.7 5.2*   < > 4.9 4.6 4.8  CL  --    < > 100 97*   < > 106 105 97*  CO2  --    < > 23 22   < > 21* 25 22  GLUCOSE  --    < > 404* 484*   < > 202* 118* 416*  BUN  --    < > 40* 37*   < > 30* 34* 25*  CREATININE  --    < > 0.68 0.85   < > 0.60 0.67 0.59  CALCIUM  --    < > 8.6* 9.0   < > 8.8* 9.2 9.2  GFRNONAA  --    < > >60 >60   < > >60 >60 >60  PROT 7.7   < > 6.2* 6.5  --   --   --  6.3*  ALBUMIN 3.2*   < > 3.1* 3.1*  --   --   --  3.0*  AST 31   < > 50* 66*  --   --   --  33  ALT 21   < > 161* 158*  --   --   --  96*  ALKPHOS 74   < > 75 79  --   --   --  75  BILITOT 1.2   < > 1.4* 1.2  --   --   --  0.9  BILIDIR 0.4*  --   --   --   --   --   --   --   IBILI 0.8  --   --   --   --   --   --   --    < > = values in this interval not displayed.    RADIOGRAPHIC STUDIES: I have personally reviewed the radiological images as listed and agreed with the findings in the report. CT HEAD WO CONTRAST (5MM)  Result Date: 01/18/2022 CLINICAL DATA:  History of metastatic non-small cell lung cancer. EXAM: CT HEAD WITHOUT CONTRAST TECHNIQUE: Contiguous axial images were obtained from the base of the skull through the vertex without intravenous contrast. RADIATION DOSE REDUCTION: This exam  was performed according to the departmental dose-optimization program which includes automated exposure control, adjustment of the mA and/or kV according to patient size and/or use of iterative reconstruction technique. COMPARISON:  Head CT and MRI brain 12/31/2021. FINDINGS: Brain: Overall definite interval improvement in the amount of tumoral edema seen in the left parietal white matter. The actual metastatic focus is difficult to identify this study without contrast and is better seen on the MRI. No new areas of edema to suggest other metastatic lesions. No evidence of acute hemispheric infarction or intracranial hemorrhage. The brainstem and cerebellum are grossly and stable. Vascular: Minimal vascular calcifications are stable. No hyperdense vessels. Skull: No skull lesions. Sinuses/Orbits: The paranasal sinuses and mastoid air cells are clear. The globes are intact. Other: No scalp lesions or scalp hematoma. IMPRESSION: 1. Overall definite interval improvement in the amount of tumoral edema in the left parietal white matter. The actual metastatic focus is difficult to identify this study without contrast and is better seen on the MRI. 2. No new areas of edema to suggest other metastatic lesions. Electronically Signed   By: Marijo Sanes M.D.   On: 01/18/2022 14:30   MR BRAIN W WO CONTRAST  Result Date: 01/20/2022 CLINICAL DATA:  Initial evaluation for brain CNS neoplasm. EXAM: MRI HEAD WITHOUT AND WITH CONTRAST TECHNIQUE: Multiplanar, multiecho pulse sequences of the brain and surrounding structures were obtained without and with intravenous contrast. CONTRAST:  68m GADAVIST GADOBUTROL 1 MMOL/ML IV SOLN COMPARISON:  Prior MRI from 12/31/2021. FINDINGS: Brain: Previously identified intracranial metastatic deposit positioned at the left parietal lobe again seen. Overall, lesion is not significantly changed in overall size measuring 1.7 x 1.7 x 1.9 cm. However, the lesion demonstrates diminished peripheral  enhancement as compared to previous exam. Additionally, associated vasogenic edema within the adjacent left parietal region is also improved. Improved mass effect on the left lateral ventricle without significant midline shift. Overall, changes are consistent with interval response to therapy. No new lesions identified. No acute or subacute infarct. Gray-white matter differentiation otherwise maintained. Underlying mild chronic microvascular ischemic disease noted. No acute intracranial hemorrhage. Small focus of chronic hemosiderin staining at the left frontal lobe noted, stable. No other new acute or chronic intracranial blood products. No other mass lesion or mass effect. No hydrocephalus or extra-axial fluid collection. Pituitary gland suprasellar region normal. Midline structures intact and normal. No other abnormal enhancement. Vascular: Major intracranial vascular flow voids are maintained. Skull and upper cervical spine: Craniocervical junction with normal limits. Bone marrow signal intensity normal. No focal marrow replacing lesion. No scalp soft tissue abnormality. Sinuses/Orbits: Globes and orbital soft tissues within normal limits. Mild scattered mucosal thickening noted about the sphenoid ethmoidal and maxillary sinuses. Small bilateral mastoid effusions noted. Other:  None. IMPRESSION: 1. Similar size of left parietal metastatic lesion, but with decreased enhancement and associated vasogenic edema, consistent with interval response to therapy. Secondary improved mass effect within the left parietal region as compared to previous. No new lesions identified. 2. No other new or acute intracranial abnormality. Electronically Signed   By: Jeannine Boga M.D.   On: 01/20/2022 01:50   DG Chest Port 1 View  Result Date: 01/18/2022 CLINICAL DATA:  Weakness. EXAM: PORTABLE CHEST 1 VIEW COMPARISON:  December 31, 2021. FINDINGS: The heart size and mediastinal contours are within normal limits. Right internal  jugular Port-A-Cath is unchanged in position. Left lung is clear. Stable right upper lobe mass is noted. The visualized skeletal structures are unremarkable. IMPRESSION: Stable right upper lobe mass is noted.  Left lung is clear. Electronically Signed   By: Marijo Conception M.D.   On: 01/18/2022 11:35    ASSESSMENT & PLAN:   Cancer of upper lobe of right lung ([facility]) #Stage IV-lung cancer-adenocarcinoma- currently on Alimta-Keytruda maintenance; MAY 2023- CT CAP-  Interval increase in size of large spiculated right upper lobe lung mass since prior CT concerning for disease progression. Interim development of multiple bilateral lung nodules, also concerning for progression of disease. Increased left axillary adenopathy also concerning for progression of disease.  #Given the progression of disease noted on Alimta Keytruda maintenance-I would recommend discontinuation of current therapy.  Discussed subsequent line therapy include- Docetaxel + Ramicrirumab-weekly 3 weeks.  Understands treatment would be palliative not curative.  Also understands that response rates are limited; and would be of shorter duration.  See discussion below.  #Thrombocytopenia platelets in the 70s-etiology unclear; unlikely from chemotherapy as last treatment was on aril21st, 2023; check immature platelet fraction.  Monitor closely.  Check DIC parameters/peripheral smear next visit.  # RECURRENT Subcentimeter brain lesions S/p SRS- finished on DEC 23rd, 2022-April 24th MRI Brain [right-sided focal weakness-ER]-is/p SBRT on 5/18.  Clinically stable.  Will taper steroids-2 mg twice daily.  For the next 1 week and then discontinue.  # Iatrogenic Hypothyrodism [TSH 86 in JAN 2023]- on sythroid 150 mcg/day; FEB 2023- 23;recent TSH-Low-currently off Synthroid.  Monitor TSH closely.   #Hyperglycemia secondary steroids/Myopathy-  416; continue lantus 10 qhs.  Also add Metformin 1000 mg BID; check BG TID; and HOLD Lantus if BG less than 150.     # COPD stable-significant improvement s/p/bronchodilators/diuretics- STABLE  #Prognosis/quality of life issues-I do long discussion with the patient and her family regarding the difficult situation with patient's performance status is declining; and it is very possible that patient could get extreme side effects of the chemotherapy-and declining quality of life.   # IV ACCESS: STABLE.    *B12 injection-  2/16-B12; # DISPOSITION: # IVFs over 1 lit #  Follow up in 1 week- NP-labs- cbc/bmp; 1lits IVFs over 1 hour # follow up in 2 weeks-MD; labs- cbc/cmp; Taxoeter-cyramza; D2- Undenyca- Dr.B  # 40 minutes face-to-face with the patient discussing the above plan of care; more than 50% of time spent on prognosis/ natural history; counseling and coordination.         All questions were answered. The patient knows to call the clinic with any problems, questions or concerns.    Cammie Sickle, MD 01/31/2022 10:25 PM

## 2022-01-31 NOTE — Progress Notes (Signed)
DISCONTINUE OFF PATHWAY REGIMEN - Non-Small Cell Lung   [id number]:Pembrolizumab 200 mg  IV D1 + Pemetrexed 500 mg/m2 IV D1 + Carboplatin AUC=5 IV D1 q21 Days:   A cycle is every 21 days:     Pembrolizumab      Pemetrexed      Carboplatin   **Always confirm dose/schedule in your pharmacy ordering system**  REASON: Disease Progression PRIOR TREATMENT: Off Pathway: Pembrolizumab 200 mg  IV D1 + Pemetrexed 500 mg/m2 IV D1 + Carboplatin AUC=5 IV D1 q21 Days TREATMENT RESPONSE: Progressive Disease (PD)  START ON PATHWAY REGIMEN - Non-Small Cell Lung     A cycle is every 21 days:     Ramucirumab      Docetaxel   **Always confirm dose/schedule in your pharmacy ordering system**  Patient Characteristics: Stage IV Metastatic, Nonsquamous, Molecular Analysis Completed, Molecular Alteration Present and Targeted Therapy Exhausted OR EGFR Exon 20+ or KRAS G12C+ or HER2+ Present and No Prior Chemo/Immunotherapy OR No Alteration Present, Second Line -  Chemotherapy/Immunotherapy, PS = 0, 1, No Prior PD-1/PD-L1  Inhibitor or Prior PD-1/PD-L1 Inhibitor + Chemotherapy, and Not a Candidate for Immunotherapy Therapeutic Status: Stage IV Metastatic Histology: Nonsquamous Cell Broad Molecular Profiling Status: Engineer, manufacturing Analysis Results: No Alteration Present ECOG Performance Status: 1 Chemotherapy/Immunotherapy Line of Therapy: Second Line Chemotherapy/Immunotherapy Immunotherapy Candidate Status: Not a Candidate for Immunotherapy Prior Immunotherapy Status: Prior PD-1/PD-L1 Inhibitor + Chemotherapy Intent of Therapy: Non-Curative / Palliative Intent, Discussed with Patient

## 2022-01-31 NOTE — Assessment & Plan Note (Addendum)
#  Stage IV-lung cancer-adenocarcinoma- currently on Alimta-Keytruda maintenance; MAY 2023- CT CAP-  Interval increase in size of large spiculated right upper lobe lung mass since prior CT concerning for disease progression. Interim development of multiple bilateral lung nodules, also concerning for progression of disease. Increased left axillary adenopathy also concerning for progression of disease.  #Given the progression of disease noted on Alimta Keytruda maintenance-I would recommend discontinuation of current therapy.  Discussed subsequent line therapy include- Docetaxel + Ramicrirumab-weekly 3 weeks.  Understands treatment would be palliative not curative.  Also understands that response rates are limited; and would be of shorter duration.  See discussion below.  #Thrombocytopenia platelets in the 70s-etiology unclear; unlikely from chemotherapy as last treatment was on aril21st, 2023; check immature platelet fraction.  Monitor closely.  Check DIC parameters/peripheral smear next visit.  # RECURRENT Subcentimeter brain lesions S/p SRS- finished on DEC 23rd, 2022-April 24th MRI Brain [right-sided focal weakness-ER]-is/p SBRT on 5/18.  Clinically stable.  Will taper steroids-2 mg twice daily.  For the next 1 week and then discontinue.  # Iatrogenic Hypothyrodism [TSH 86 in JAN 2023]- on sythroid 150 mcg/day; FEB 2023- 23;recent TSH-Low-currently off Synthroid.  Monitor TSH closely.   #Hyperglycemia secondary steroids/Myopathy-  416; continue lantus 10 qhs.  Also add Metformin 1000 mg BID; check BG TID; and HOLD Lantus if BG less than 150.    # COPD stable-significant improvement s/p/bronchodilators/diuretics- STABLE  #Prognosis/quality of life issues-I do long discussion with the patient and her family regarding the difficult situation with patient's performance status is declining; and it is very possible that patient could get extreme side effects of the chemotherapy-and declining quality of life.    # IV ACCESS: STABLE.    *B12 injection-  2/16-B12; # DISPOSITION: # IVFs over 1 lit #  Follow up in 1 week- NP-labs- cbc/bmp; 1lits IVFs over 1 hour # follow up in 2 weeks-MD; labs- cbc/cmp; Taxoeter-cyramza; D2- Undenyca- Dr.B  # 40 minutes face-to-face with the patient discussing the above plan of care; more than 50% of time spent on prognosis/ natural history; counseling and coordination.

## 2022-01-31 NOTE — Progress Notes (Signed)
Patient still having leg weakness and not able to walk and does use a wheelchair to ambulate at home.  Will be decreasing her steroid dose from 8 mg to 4 mg starting tonight.  Appetite is "OK" with a slight decrease in the past couple days with a 6 lb wt loss since last documented wt.    Bp in office check today 120/96 HR 107.

## 2022-02-01 LAB — THYROID PANEL WITH TSH
Free Thyroxine Index: 1.2 (ref 1.2–4.9)
T3 Uptake Ratio: 28 % (ref 24–39)
T4, Total: 4.4 ug/dL — ABNORMAL LOW (ref 4.5–12.0)
TSH: 0.209 u[IU]/mL — ABNORMAL LOW (ref 0.450–4.500)

## 2022-02-02 ENCOUNTER — Encounter: Payer: Self-pay | Admitting: Internal Medicine

## 2022-02-05 ENCOUNTER — Encounter: Payer: Self-pay | Admitting: Physical Therapy

## 2022-02-05 ENCOUNTER — Ambulatory Visit: Payer: Medicaid Other | Admitting: Physical Therapy

## 2022-02-05 ENCOUNTER — Telehealth: Payer: Self-pay

## 2022-02-05 DIAGNOSIS — M6281 Muscle weakness (generalized): Secondary | ICD-10-CM

## 2022-02-05 DIAGNOSIS — R262 Difficulty in walking, not elsewhere classified: Secondary | ICD-10-CM | POA: Diagnosis not present

## 2022-02-05 NOTE — Addendum Note (Signed)
Addended by: Christie Nottingham on: 02/05/2022 03:49 PM   Modules accepted: Orders

## 2022-02-05 NOTE — Telephone Encounter (Signed)
Pt sent mychart message stating glucose was 108 and should she adjust insulin.      Per Dr B Mychart message: Please inform patient- that it is OK to take metformin as she still on the steroids. Ask her to lower the dose of insulin at night to 8 units.   Mychart message sent with above info from Dr. Jacinto Reap.

## 2022-02-05 NOTE — Therapy (Unsigned)
[facility] [address], [place], [address] Phone: [phone]   Fax:  [phone]  Physical Therapy Treatment  Patient Details  Name: Nichole Park MRN: [id number] Date of Birth: 31-Aug-1961 Referring Provider (PT): Charlaine Dalton, MD   Encounter Date: 02/05/2022    Past Medical History:  Diagnosis Date   Graves disease    Malignant pericardial effusion    Paroxysmal atrial fibrillation [facility])    Primary lung adenocarcinoma [facility])     Past Surgical History:  Procedure Laterality Date   IR CV LINE INJECTION  06/19/2021   IR IMAGING GUIDED PORT INSERTION  04/27/2021   PERICARDIOCENTESIS N/A 03/27/2021   Procedure: PERICARDIOCENTESIS;  Surgeon: Nelva Bush, MD;  Location: [place] [facility];  Service: Cardiovascular;  Laterality: N/A;   TUBAL LIGATION      There were no vitals filed for this visit.     Ther-Ex Alt heel raise 2x 12 pursed lip breathing between each set for O2 to raise from 86% to 90% Seated abd <> add 2x 10/8 with PLB, long rest between sets Alt UE flex from hand from knee in min ROM 2x 10 SpO2 >89% throughout exercise  Therapeutic Activity Education to caregiver (boyfriend) on transferring patient safely in current state of maxA (ie getting WC as close to surface to complete pivot transfer as possible). Caregiver is able to complete WC to car with safety- pt with more diffiuclty with PT attempting transfer, complete maxA.  Education on home health PT avenue and purpose, with boyfriend on board, advises PT to call patients daughter.                         PT Short Term Goals - 01/28/22 1810       PT SHORT TERM GOAL #1   Title Pt will be independent with HEP in order to improve strength and balance in order to decrease fall risk and improve function at home and work.    Baseline HEP: Not given at this time.    Time 4    Period Weeks    Status New     Target Date 02/25/22               PT Long Term Goals - 01/28/22 1811       PT LONG TERM GOAL #1   Title Pt will decrease TUG to below 14 seconds/decrease in order to demonstrate decreased fall risk.    Baseline Eval: TUG not performed due to inability to stand.    Time 8    Period Weeks    Status New    Target Date 03/25/22      PT LONG TERM GOAL #2   Title Pt will improve ABC by at least 13% in order to demonstrate clinically significant improvement in balance confidence.    Baseline EVAL: 3.125%    Time 8    Period Weeks    Status New    Target Date 03/25/22      PT LONG TERM GOAL #3   Title Pt will decrease 5TSTS by at least 3 seconds in order to demonstrate clinically significant improvement in LE strength.    Baseline EVAL: Unable to perform    Time 8    Period Weeks    Status New    Target Date 03/25/22      PT LONG TERM GOAL #4   Title Patient will demonstrate improved  function as evidenced by a score of 47 on FOTO measure for full participation in activities at home and in the community.    Baseline EVAL:  FOTO: 34    Time 8    Period Weeks    Status New    Target Date 03/25/22                    Patient will benefit from skilled therapeutic intervention in order to improve the following deficits and impairments:     Visit Diagnosis: No diagnosis found.     Problem List Patient Active Problem List   Diagnosis Date Noted   Electrolyte abnormality 01/19/2022   Hyperglycemia 01/18/2022   Hypothyroidism 01/01/2022   Paroxysmal atrial fibrillation ([place]) 01/01/2022   Lung cancer metastatic to brain ([place]) 01/01/2022   Metastatic lung cancer (metastasis from lung to other site) [facility]) 12/31/2021   Cancer of upper lobe of right lung ([place]) 04/10/2021   Lung mass    Palliative care encounter    PAF (paroxysmal atrial fibrillation) ([place]) 03/30/2021   COPD (chronic obstructive pulmonary disease) ([place]) 03/30/2021   Hyperthyroidism 03/29/2021    Malignant pericardial effusion    Tobacco use    Cardiac tamponade 03/27/2021   Cardiac/pericardial tamponade 03/27/2021   Hypertension 03/22/2014    Durwin Reges, PT 02/05/2022, 1:52 PM  [facility] [address], [place], [address] Phone: [phone]   Fax:  [phone]  Name: DALINDA HEIDT MRN: [id number] Date of Birth: 17-Feb-1961

## 2022-02-06 ENCOUNTER — Inpatient Hospital Stay: Payer: Medicaid Other | Admitting: Licensed Clinical Social Worker

## 2022-02-06 DIAGNOSIS — C3490 Malignant neoplasm of unspecified part of unspecified bronchus or lung: Secondary | ICD-10-CM

## 2022-02-06 NOTE — Progress Notes (Signed)
[facility]  Initial Assessment   Nichole Park is a 61 y.o. year old female contacted caregiver by phone. Clinical Social Work was referred by nurse navigator for assessment of psychosocial needs.   SDOH (Social Determinants of Health) assessments performed: Yes SDOH Interventions    Flowsheet Row Most Recent Value  SDOH Interventions   Food Insecurity Interventions Intervention Not Indicated  Financial Strain Interventions Intervention Not Indicated  Housing Interventions Intervention Not Indicated  Stress Interventions Provide Counseling  Social Connections Interventions Intervention Not Indicated  Transportation Interventions Patient Resources (Friends/Family), Intervention Not Indicated  Depression Interventions/Treatment  Counseling       SDOH Screenings   Alcohol Screen: Low Risk    Last Alcohol Screening Score (AUDIT): 0  Depression (PHQ2-9): Medium Risk   PHQ-2 Score: 5  Financial Resource Strain: Medium Risk   Difficulty of Paying Living Expenses: Somewhat hard  Food Insecurity: Landscape architect Present   Worried About Charity fundraiser in the Last Year: Never true   Ran Out of Food in the Last Year: Sometimes true  Housing: Low Risk    Last Housing Risk Score: 0  Physical Activity: Inactive   Days of Exercise per Week: 0 days   Minutes of Exercise per Session: 0 min  Social Connections: Moderately Integrated   Frequency of Communication with Friends and Family: Three times a week   Frequency of Social Gatherings with Friends and Family: Three times a week   Attends Religious Services: Never   Active Member of Clubs or Organizations: Yes   Attends Archivist Meetings: Never   Marital Status: Living with partner  Stress: Stress Concern Present   Feeling of Stress : Rather much  Tobacco Use: Medium Risk   Smoking Tobacco Use: Former   Smokeless Tobacco Use: Never   Passive Exposure: Not on file  Transportation Needs: No Transportation  Needs   Lack of Transportation (Medical): No   Lack of Transportation (Non-Medical): No     Distress Screen completed: Yes    04/12/2021    9:02 AM  ONCBCN DISTRESS SCREENING  Screening Type Initial Screening  Distress experienced in past week (1-10) 7  Practical problem type Insurance;Housing  Emotional problem type Adjusting to illness  Information Concerns Type Lack of info about treatment  Physician notified of physical symptoms Yes  Referral to clinical social work Yes  Referral to financial advocate Yes      Family/Social Information:  Housing Arrangement: patient lives with significant other , primary care giver/daughter Nichole Park [phone] Family members/support persons in your life? Family and Medical Staff Transportation concerns: no  Employment: Disabled .  Income source: No income Financial concerns: Yes, current concerns Type of concern: Medical bills and Food Food access concerns: no Religious or spiritual practice: Not known Services Currently in place:  Medicaid prepaid health plan, SSI  Coping/ Adjustment to diagnosis: Patient understands treatment plan and what happens next? yes Concerns about diagnosis and/or treatment: Pain or discomfort during procedures, Overwhelmed by information, Afraid of cancer, How I will pay for the services I need, How will I care for myself, and Quality of life Patient reported stressors: Finances, Depression, Anxiety/ nervousness, Adjusting to my illness, and Physical issues Hopes and/or priorities: N/A Patient enjoys  N/A Current coping skills/ strengths: Supportive family/friends     SUMMARY: Current SDOH Barriers:  Fixed income, caregiver concerns, in ability to perform ADLs unassisted, limited social contact  Clinical Social Work Clinical Goal(s):  Patient will follow  up with PCP and PT to discuss concerns with lack of ambulation since discharge on 01/20/2022* as directed by SW  Interventions: Discussed common  feeling and emotions when being diagnosed with cancer, and the importance of support during treatment Informed patient of the support team roles and support services at [facility] Provided CSW contact information and encouraged patient to call with any questions or concerns Provided patient with information about CSW role in patient care and Provided education regarding Skilled Nursing Placement process.   Follow Up Plan: Patient will contact CSW with any support or resource needs and Patient will contact PCP to discuss SNF placement.  Patient verbalizes understanding of plan: Yes  CSW spoke with patient's primary caregiver, daughter Nichole Park.  Nichole will contact PCP and outpatient PT about SNF placement.  Adelene Amas, LCSW   Patient is participating in a Managed Medicaid Plan:  Yes

## 2022-02-07 ENCOUNTER — Other Ambulatory Visit: Payer: Self-pay

## 2022-02-07 ENCOUNTER — Emergency Department: Payer: Medicaid Other

## 2022-02-07 ENCOUNTER — Encounter: Admission: EM | Disposition: E | Payer: Self-pay | Source: Home / Self Care | Attending: Surgery

## 2022-02-07 ENCOUNTER — Encounter: Payer: Self-pay | Admitting: Surgery

## 2022-02-07 ENCOUNTER — Encounter: Payer: Self-pay | Admitting: Internal Medicine

## 2022-02-07 ENCOUNTER — Inpatient Hospital Stay: Payer: Medicaid Other

## 2022-02-07 ENCOUNTER — Inpatient Hospital Stay (HOSPITAL_BASED_OUTPATIENT_CLINIC_OR_DEPARTMENT_OTHER): Payer: Medicaid Other | Admitting: Medical Oncology

## 2022-02-07 ENCOUNTER — Emergency Department: Payer: Medicaid Other | Admitting: Anesthesiology

## 2022-02-07 ENCOUNTER — Inpatient Hospital Stay
Admission: EM | Admit: 2022-02-07 | Discharge: 2022-03-09 | DRG: 853 | Disposition: E | Payer: Medicaid Other | Attending: Surgery | Admitting: Surgery

## 2022-02-07 ENCOUNTER — Inpatient Hospital Stay: Payer: Medicaid Other | Attending: Internal Medicine

## 2022-02-07 ENCOUNTER — Encounter: Payer: Self-pay | Admitting: Medical Oncology

## 2022-02-07 VITALS — BP 107/86 | HR 115 | Temp 97.8°F | Resp 21

## 2022-02-07 DIAGNOSIS — A4151 Sepsis due to Escherichia coli [E. coli]: Principal | ICD-10-CM | POA: Diagnosis present

## 2022-02-07 DIAGNOSIS — R590 Localized enlarged lymph nodes: Secondary | ICD-10-CM | POA: Diagnosis present

## 2022-02-07 DIAGNOSIS — C3431 Malignant neoplasm of lower lobe, right bronchus or lung: Secondary | ICD-10-CM | POA: Diagnosis present

## 2022-02-07 DIAGNOSIS — C3411 Malignant neoplasm of upper lobe, right bronchus or lung: Secondary | ICD-10-CM | POA: Diagnosis present

## 2022-02-07 DIAGNOSIS — J9622 Acute and chronic respiratory failure with hypercapnia: Secondary | ICD-10-CM | POA: Diagnosis present

## 2022-02-07 DIAGNOSIS — Z978 Presence of other specified devices: Secondary | ICD-10-CM

## 2022-02-07 DIAGNOSIS — G8918 Other acute postprocedural pain: Secondary | ICD-10-CM | POA: Diagnosis not present

## 2022-02-07 DIAGNOSIS — K5720 Diverticulitis of large intestine with perforation and abscess without bleeding: Secondary | ICD-10-CM

## 2022-02-07 DIAGNOSIS — I3131 Malignant pericardial effusion in diseases classified elsewhere: Secondary | ICD-10-CM | POA: Insufficient documentation

## 2022-02-07 DIAGNOSIS — E875 Hyperkalemia: Secondary | ICD-10-CM | POA: Diagnosis present

## 2022-02-07 DIAGNOSIS — E1110 Type 2 diabetes mellitus with ketoacidosis without coma: Secondary | ICD-10-CM | POA: Diagnosis present

## 2022-02-07 DIAGNOSIS — Z794 Long term (current) use of insulin: Secondary | ICD-10-CM

## 2022-02-07 DIAGNOSIS — J9601 Acute respiratory failure with hypoxia: Secondary | ICD-10-CM

## 2022-02-07 DIAGNOSIS — J440 Chronic obstructive pulmonary disease with acute lower respiratory infection: Secondary | ICD-10-CM | POA: Diagnosis present

## 2022-02-07 DIAGNOSIS — Z20822 Contact with and (suspected) exposure to covid-19: Secondary | ICD-10-CM | POA: Diagnosis present

## 2022-02-07 DIAGNOSIS — R1084 Generalized abdominal pain: Secondary | ICD-10-CM

## 2022-02-07 DIAGNOSIS — K760 Fatty (change of) liver, not elsewhere classified: Secondary | ICD-10-CM | POA: Diagnosis present

## 2022-02-07 DIAGNOSIS — K660 Peritoneal adhesions (postprocedural) (postinfection): Secondary | ICD-10-CM | POA: Diagnosis present

## 2022-02-07 DIAGNOSIS — E869 Volume depletion, unspecified: Secondary | ICD-10-CM | POA: Diagnosis not present

## 2022-02-07 DIAGNOSIS — R531 Weakness: Secondary | ICD-10-CM

## 2022-02-07 DIAGNOSIS — C3490 Malignant neoplasm of unspecified part of unspecified bronchus or lung: Secondary | ICD-10-CM | POA: Diagnosis not present

## 2022-02-07 DIAGNOSIS — C7931 Secondary malignant neoplasm of brain: Secondary | ICD-10-CM

## 2022-02-07 DIAGNOSIS — I700 Atherosclerosis of aorta: Secondary | ICD-10-CM | POA: Diagnosis present

## 2022-02-07 DIAGNOSIS — R410 Disorientation, unspecified: Secondary | ICD-10-CM

## 2022-02-07 DIAGNOSIS — I480 Paroxysmal atrial fibrillation: Secondary | ICD-10-CM | POA: Diagnosis present

## 2022-02-07 DIAGNOSIS — A419 Sepsis, unspecified organism: Secondary | ICD-10-CM | POA: Diagnosis present

## 2022-02-07 DIAGNOSIS — D696 Thrombocytopenia, unspecified: Secondary | ICD-10-CM | POA: Diagnosis present

## 2022-02-07 DIAGNOSIS — Z9221 Personal history of antineoplastic chemotherapy: Secondary | ICD-10-CM

## 2022-02-07 DIAGNOSIS — J9621 Acute and chronic respiratory failure with hypoxia: Secondary | ICD-10-CM | POA: Diagnosis present

## 2022-02-07 DIAGNOSIS — I469 Cardiac arrest, cause unspecified: Secondary | ICD-10-CM

## 2022-02-07 DIAGNOSIS — E032 Hypothyroidism due to medicaments and other exogenous substances: Secondary | ICD-10-CM | POA: Insufficient documentation

## 2022-02-07 DIAGNOSIS — T380X5A Adverse effect of glucocorticoids and synthetic analogues, initial encounter: Secondary | ICD-10-CM | POA: Diagnosis present

## 2022-02-07 DIAGNOSIS — Z515 Encounter for palliative care: Secondary | ICD-10-CM | POA: Diagnosis not present

## 2022-02-07 DIAGNOSIS — Z7984 Long term (current) use of oral hypoglycemic drugs: Secondary | ICD-10-CM

## 2022-02-07 DIAGNOSIS — Z9911 Dependence on respirator [ventilator] status: Secondary | ICD-10-CM

## 2022-02-07 DIAGNOSIS — Z87891 Personal history of nicotine dependence: Secondary | ICD-10-CM

## 2022-02-07 DIAGNOSIS — Z888 Allergy status to other drugs, medicaments and biological substances status: Secondary | ICD-10-CM

## 2022-02-07 DIAGNOSIS — J189 Pneumonia, unspecified organism: Secondary | ICD-10-CM | POA: Diagnosis present

## 2022-02-07 DIAGNOSIS — Z79899 Other long term (current) drug therapy: Secondary | ICD-10-CM

## 2022-02-07 DIAGNOSIS — Z923 Personal history of irradiation: Secondary | ICD-10-CM

## 2022-02-07 DIAGNOSIS — R6521 Severe sepsis with septic shock: Secondary | ICD-10-CM | POA: Diagnosis present

## 2022-02-07 DIAGNOSIS — R58 Hemorrhage, not elsewhere classified: Secondary | ICD-10-CM | POA: Diagnosis present

## 2022-02-07 DIAGNOSIS — E871 Hypo-osmolality and hyponatremia: Secondary | ICD-10-CM | POA: Diagnosis present

## 2022-02-07 HISTORY — PX: COLECTOMY WITH COLOSTOMY CREATION/HARTMANN PROCEDURE: SHX6598

## 2022-02-07 HISTORY — PX: UNILATERAL SALPINGECTOMY: SHX6160

## 2022-02-07 HISTORY — PX: LAPAROTOMY: SHX154

## 2022-02-07 LAB — CBC WITH DIFFERENTIAL/PLATELET
Abs Immature Granulocytes: 1.43 10*3/uL — ABNORMAL HIGH (ref 0.00–0.07)
Abs Immature Granulocytes: 0.92 10*3/uL — ABNORMAL HIGH (ref 0.00–0.07)
Abs Immature Granulocytes: 0.68 10*3/uL — ABNORMAL HIGH (ref 0.00–0.07)
Basophils Absolute: 0 10*3/uL (ref 0.0–0.1)
Basophils Absolute: 0 10*3/uL (ref 0.0–0.1)
Basophils Absolute: 0 10*3/uL (ref 0.0–0.1)
Basophils Relative: 0 %
Basophils Relative: 0 %
Basophils Relative: 0 %
Eosinophils Absolute: 0 10*3/uL (ref 0.0–0.5)
Eosinophils Absolute: 0 10*3/uL (ref 0.0–0.5)
Eosinophils Absolute: 0 10*3/uL (ref 0.0–0.5)
Eosinophils Relative: 0 %
Eosinophils Relative: 0 %
Eosinophils Relative: 0 %
HCT: 41.3 % (ref 36.0–46.0)
HCT: 43.6 % (ref 36.0–46.0)
HCT: 39.8 % (ref 36.0–46.0)
Hemoglobin: 14.6 g/dL (ref 12.0–15.0)
Hemoglobin: 14.7 g/dL (ref 12.0–15.0)
Hemoglobin: 13.1 g/dL (ref 12.0–15.0)
Immature Granulocytes: 12 %
Immature Granulocytes: 10 %
Immature Granulocytes: 7 %
Lymphocytes Relative: 8 %
Lymphocytes Relative: 9 %
Lymphocytes Relative: 11 %
Lymphs Abs: 0.9 10*3/uL (ref 0.7–4.0)
Lymphs Abs: 0.8 10*3/uL (ref 0.7–4.0)
Lymphs Abs: 1 10*3/uL (ref 0.7–4.0)
MCH: 32.8 pg (ref 26.0–34.0)
MCH: 31.9 pg (ref 26.0–34.0)
MCH: 31.8 pg (ref 26.0–34.0)
MCHC: 35.4 g/dL (ref 30.0–36.0)
MCHC: 33.7 g/dL (ref 30.0–36.0)
MCHC: 32.9 g/dL (ref 30.0–36.0)
MCV: 92.8 fL (ref 80.0–100.0)
MCV: 94.6 fL (ref 80.0–100.0)
MCV: 96.6 fL (ref 80.0–100.0)
Monocytes Absolute: 0.3 10*3/uL (ref 0.1–1.0)
Monocytes Absolute: 0.4 10*3/uL (ref 0.1–1.0)
Monocytes Absolute: 0.5 10*3/uL (ref 0.1–1.0)
Monocytes Relative: 3 %
Monocytes Relative: 4 %
Monocytes Relative: 5 %
Neutro Abs: 9.3 10*3/uL — ABNORMAL HIGH (ref 1.7–7.7)
Neutro Abs: 6.9 10*3/uL (ref 1.7–7.7)
Neutro Abs: 7.1 10*3/uL (ref 1.7–7.7)
Neutrophils Relative %: 77 %
Neutrophils Relative %: 77 %
Neutrophils Relative %: 77 %
Platelets: 95 10*3/uL — ABNORMAL LOW (ref 150–400)
Platelets: 94 10*3/uL — ABNORMAL LOW (ref 150–400)
Platelets: 76 10*3/uL — ABNORMAL LOW (ref 150–400)
RBC: 4.45 MIL/uL (ref 3.87–5.11)
RBC: 4.61 MIL/uL (ref 3.87–5.11)
RBC: 4.12 MIL/uL (ref 3.87–5.11)
RDW: 13.3 % (ref 11.5–15.5)
RDW: 13.5 % (ref 11.5–15.5)
RDW: 13.6 % (ref 11.5–15.5)
Smear Review: DECREASED
Smear Review: NORMAL
WBC: 12 10*3/uL — ABNORMAL HIGH (ref 4.0–10.5)
WBC: 9 10*3/uL (ref 4.0–10.5)
WBC: 9.3 10*3/uL (ref 4.0–10.5)
nRBC: 1 % — ABNORMAL HIGH (ref 0.0–0.2)
nRBC: 1.9 % — ABNORMAL HIGH (ref 0.0–0.2)
nRBC: 1.7 % — ABNORMAL HIGH (ref 0.0–0.2)

## 2022-02-07 LAB — FIBRINOGEN: Fibrinogen: 705 mg/dL — ABNORMAL HIGH (ref 210–475)

## 2022-02-07 LAB — BASIC METABOLIC PANEL
Anion gap: 14 (ref 5–15)
BUN: 27 mg/dL — ABNORMAL HIGH (ref 6–20)
CO2: 18 mmol/L — ABNORMAL LOW (ref 22–32)
Calcium: 9.4 mg/dL (ref 8.9–10.3)
Chloride: 95 mmol/L — ABNORMAL LOW (ref 98–111)
Creatinine, Ser: 0.71 mg/dL (ref 0.44–1.00)
GFR, Estimated: >60 mL/min (ref >60)
Glucose, Bld: 429 mg/dL — ABNORMAL HIGH (ref 70–99)
Potassium: 4.6 mmol/L (ref 3.5–5.1)
Sodium: 127 mmol/L — ABNORMAL LOW (ref 135–145)

## 2022-02-07 LAB — COMPREHENSIVE METABOLIC PANEL
ALT: 61 U/L — ABNORMAL HIGH (ref 0–44)
AST: 56 U/L — ABNORMAL HIGH (ref 15–41)
Albumin: 2.1 g/dL — ABNORMAL LOW (ref 3.5–5.0)
Alkaline Phosphatase: 122 U/L (ref 38–126)
Anion gap: 17 — ABNORMAL HIGH (ref 5–15)
BUN: 31 mg/dL — ABNORMAL HIGH (ref 6–20)
CO2: 17 mmol/L — ABNORMAL LOW (ref 22–32)
Calcium: 9.7 mg/dL (ref 8.9–10.3)
Chloride: 98 mmol/L (ref 98–111)
Creatinine, Ser: 0.77 mg/dL (ref 0.44–1.00)
GFR, Estimated: >60 mL/min (ref >60)
Glucose, Bld: 441 mg/dL — ABNORMAL HIGH (ref 70–99)
Potassium: 4.4 mmol/L (ref 3.5–5.1)
Sodium: 132 mmol/L — ABNORMAL LOW (ref 135–145)
Total Bilirubin: 1 mg/dL (ref 0.3–1.2)
Total Protein: 6 g/dL — ABNORMAL LOW (ref 6.5–8.1)

## 2022-02-07 LAB — TYPE AND SCREEN
ABO/RH(D): B POS
Antibody Screen: NEGATIVE

## 2022-02-07 LAB — LACTIC ACID, PLASMA
Lactic Acid, Venous: 6.2 mmol/L (ref 0.5–1.9)
Lactic Acid, Venous: 3.5 mmol/L (ref 0.5–1.9)

## 2022-02-07 LAB — PROTIME-INR
INR: 1.1 (ref 0.8–1.2)
Prothrombin Time: 13.6 seconds (ref 11.4–15.2)

## 2022-02-07 LAB — SARS CORONAVIRUS 2 BY RT PCR: SARS Coronavirus 2 by RT PCR: NEGATIVE

## 2022-02-07 LAB — CBG MONITORING, ED
Glucose-Capillary: 291 mg/dL — ABNORMAL HIGH (ref 70–99)
Glucose-Capillary: 266 mg/dL — ABNORMAL HIGH (ref 70–99)

## 2022-02-07 LAB — IMMATURE PLATELET FRACTION: Immature Platelet Fraction: 19.1 % — ABNORMAL HIGH (ref 1.2–8.6)

## 2022-02-07 LAB — GLUCOSE, CAPILLARY: Glucose-Capillary: 190 mg/dL — ABNORMAL HIGH (ref 70–99)

## 2022-02-07 LAB — PATHOLOGIST SMEAR REVIEW

## 2022-02-07 LAB — LACTATE DEHYDROGENASE: LDH: 311 U/L — ABNORMAL HIGH (ref 98–192)

## 2022-02-07 LAB — APTT: aPTT: 23 seconds — ABNORMAL LOW (ref 24–36)

## 2022-02-07 SURGERY — LAPAROTOMY, EXPLORATORY
Anesthesia: General | Site: Abdomen

## 2022-02-07 MED ORDER — OXYCODONE HCL 5 MG PO TABS: 5.0000 mg | ORAL_TABLET | Freq: Once | ORAL | Status: DC | PRN

## 2022-02-07 MED ORDER — FENTANYL CITRATE (PF) 100 MCG/2ML IJ SOLN
INTRAMUSCULAR | Status: AC
  Filled 2022-02-07: qty 2

## 2022-02-07 MED ORDER — INSULIN REGULAR(HUMAN) IN NACL 100-0.9 UT/100ML-% IV SOLN
INTRAVENOUS | Status: DC
  Administered 2022-02-07: 6.5 [IU]/h via INTRAVENOUS
  Filled 2022-02-07: qty 100

## 2022-02-07 MED ORDER — MIDAZOLAM HCL 2 MG/2ML IJ SOLN
INTRAMUSCULAR | Status: DC | PRN
  Administered 2022-02-07 (×2): .5 mg via INTRAVENOUS

## 2022-02-07 MED ORDER — DILTIAZEM HCL ER COATED BEADS 180 MG PO CP24: 180.0000 mg | ORAL_CAPSULE | Freq: Every day | ORAL | Status: DC

## 2022-02-07 MED ORDER — ACETAMINOPHEN 10 MG/ML IV SOLN
INTRAVENOUS | Status: DC | PRN
  Administered 2022-02-07: 1000 mg via INTRAVENOUS

## 2022-02-07 MED ORDER — PHENYLEPHRINE HCL-NACL 20-0.9 MG/250ML-% IV SOLN
25.0000 ug/min | INTRAVENOUS | Status: DC
  Administered 2022-02-07: 160 ug/min via INTRAVENOUS
  Administered 2022-02-08 (×2): 150 ug/min via INTRAVENOUS
  Filled 2022-02-07 (×4): qty 250

## 2022-02-07 MED ORDER — PHENYLEPHRINE HCL (PRESSORS) 10 MG/ML IV SOLN
INTRAVENOUS | Status: DC | PRN
  Administered 2022-02-07: 120 ug via INTRAVENOUS

## 2022-02-07 MED ORDER — VANCOMYCIN HCL IN DEXTROSE 1-5 GM/200ML-% IV SOLN
1000.0000 mg | Freq: Once | INTRAVENOUS | Status: AC
  Administered 2022-02-07: 1000 mg via INTRAVENOUS
  Filled 2022-02-07: qty 200

## 2022-02-07 MED ORDER — SUGAMMADEX SODIUM 200 MG/2ML IV SOLN
INTRAVENOUS | Status: DC | PRN
  Administered 2022-02-07: 200 mg via INTRAVENOUS

## 2022-02-07 MED ORDER — ONDANSETRON 4 MG PO TBDP: 4.0000 mg | ORAL_TABLET | Freq: Four times a day (QID) | ORAL | Status: DC | PRN

## 2022-02-07 MED ORDER — IOHEXOL 300 MG/ML  SOLN
100.0000 mL | Freq: Once | INTRAMUSCULAR | Status: AC | PRN
  Administered 2022-02-07: 100 mL via INTRAVENOUS

## 2022-02-07 MED ORDER — PHENYLEPHRINE HCL-NACL 20-0.9 MG/250ML-% IV SOLN
INTRAVENOUS | Status: AC
  Filled 2022-02-07: qty 250

## 2022-02-07 MED ORDER — SODIUM CHLORIDE 0.9 % IV SOLN
2.0000 g | Freq: Once | INTRAVENOUS | Status: AC
  Administered 2022-02-07: 2 g via INTRAVENOUS
  Filled 2022-02-07: qty 12.5

## 2022-02-07 MED ORDER — LACTATED RINGERS IV SOLN: INTRAVENOUS | Status: DC

## 2022-02-07 MED ORDER — BUDESONIDE 0.5 MG/2ML IN SUSP
0.5000 mg | Freq: Two times a day (BID) | RESPIRATORY_TRACT | Status: DC
  Administered 2022-02-08 – 2022-02-10 (×6): 0.5 mg via RESPIRATORY_TRACT
  Filled 2022-02-07 (×6): qty 2

## 2022-02-07 MED ORDER — MIDAZOLAM HCL 2 MG/2ML IJ SOLN
INTRAMUSCULAR | Status: AC
  Filled 2022-02-07: qty 2

## 2022-02-07 MED ORDER — ROCURONIUM BROMIDE 100 MG/10ML IV SOLN
INTRAVENOUS | Status: DC | PRN
  Administered 2022-02-07: 40 mg via INTRAVENOUS

## 2022-02-07 MED ORDER — CELECOXIB 200 MG PO CAPS
200.0000 mg | ORAL_CAPSULE | Freq: Two times a day (BID) | ORAL | Status: DC
  Administered 2022-02-09: 200 mg via ORAL
  Filled 2022-02-07 (×4): qty 1

## 2022-02-07 MED ORDER — ONDANSETRON HCL 4 MG/2ML IJ SOLN: 4.0000 mg | Freq: Four times a day (QID) | INTRAMUSCULAR | Status: DC | PRN

## 2022-02-07 MED ORDER — PROPOFOL 10 MG/ML IV BOLUS
INTRAVENOUS | Status: DC | PRN
  Administered 2022-02-07: 50 mg via INTRAVENOUS

## 2022-02-07 MED ORDER — OXYCODONE HCL 5 MG/5ML PO SOLN: 5.0000 mg | Freq: Once | ORAL | Status: DC | PRN

## 2022-02-07 MED ORDER — SODIUM BICARBONATE 8.4 % IV SOLN
INTRAVENOUS | Status: AC
  Administered 2022-02-07: 100 meq via INTRAVENOUS
  Filled 2022-02-07: qty 100

## 2022-02-07 MED ORDER — METRONIDAZOLE 500 MG/100ML IV SOLN
500.0000 mg | Freq: Two times a day (BID) | INTRAVENOUS | Status: AC
  Administered 2022-02-08 (×3): 500 mg via INTRAVENOUS
  Filled 2022-02-07 (×3): qty 100

## 2022-02-07 MED ORDER — IPRATROPIUM-ALBUTEROL 0.5-2.5 (3) MG/3ML IN SOLN
3.0000 mL | Freq: Four times a day (QID) | RESPIRATORY_TRACT | Status: DC | PRN
  Administered 2022-02-10: 3 mL via RESPIRATORY_TRACT
  Filled 2022-02-07: qty 3

## 2022-02-07 MED ORDER — ACETAMINOPHEN 10 MG/ML IV SOLN
INTRAVENOUS | Status: AC
  Filled 2022-02-07: qty 100

## 2022-02-07 MED ORDER — SODIUM CHLORIDE 0.9 % IV SOLN
250.0000 mL | INTRAVENOUS | Status: DC
  Administered 2022-02-08 – 2022-02-10 (×3): 250 mL via INTRAVENOUS

## 2022-02-07 MED ORDER — SODIUM BICARBONATE 8.4 % IV SOLN: 100.0000 meq | Freq: Once | INTRAVENOUS | Status: AC

## 2022-02-07 MED ORDER — IPRATROPIUM-ALBUTEROL 0.5-2.5 (3) MG/3ML IN SOLN
RESPIRATORY_TRACT | Status: AC
  Administered 2022-02-07: 3 mL via RESPIRATORY_TRACT
  Filled 2022-02-07: qty 3

## 2022-02-07 MED ORDER — 0.9 % SODIUM CHLORIDE (POUR BTL) OPTIME
TOPICAL | Status: DC | PRN
  Administered 2022-02-07: 2000 mL

## 2022-02-07 MED ORDER — PROPOFOL 10 MG/ML IV BOLUS
INTRAVENOUS | Status: AC
  Filled 2022-02-07: qty 20

## 2022-02-07 MED ORDER — ONDANSETRON HCL 4 MG/2ML IJ SOLN: 4.0000 mg | Freq: Once | INTRAMUSCULAR | Status: DC | PRN

## 2022-02-07 MED ORDER — ONDANSETRON HCL 4 MG/2ML IJ SOLN
INTRAMUSCULAR | Status: DC | PRN
  Administered 2022-02-07: 4 mg via INTRAVENOUS

## 2022-02-07 MED ORDER — SODIUM CHLORIDE 0.9 % IV SOLN: INTRAVENOUS | Status: DC | PRN

## 2022-02-07 MED ORDER — FOLIC ACID 1 MG PO TABS
1.0000 mg | ORAL_TABLET | Freq: Every day | ORAL | Status: DC
  Administered 2022-02-09 – 2022-02-10 (×2): 1 mg via ORAL
  Filled 2022-02-07 (×2): qty 1

## 2022-02-07 MED ORDER — LACTATED RINGERS IV BOLUS
1000.0000 mL | Freq: Once | INTRAVENOUS | Status: AC
  Administered 2022-02-07: 1000 mL via INTRAVENOUS

## 2022-02-07 MED ORDER — ACETAMINOPHEN 500 MG PO TABS: 1000.0000 mg | ORAL_TABLET | Freq: Four times a day (QID) | ORAL | Status: DC

## 2022-02-07 MED ORDER — FENTANYL CITRATE (PF) 100 MCG/2ML IJ SOLN: 25.0000 ug | INTRAMUSCULAR | Status: DC | PRN

## 2022-02-07 MED ORDER — HYDROMORPHONE HCL 1 MG/ML IJ SOLN
0.5000 mg | INTRAMUSCULAR | Status: DC | PRN
  Administered 2022-02-10: 0.5 mg via INTRAVENOUS
  Filled 2022-02-07: qty 1

## 2022-02-07 MED ORDER — ACETAMINOPHEN 10 MG/ML IV SOLN
1000.0000 mg | Freq: Once | INTRAVENOUS | Status: DC | PRN
Start: 2022-02-07 — End: 2022-02-07

## 2022-02-07 MED ORDER — FENTANYL CITRATE (PF) 100 MCG/2ML IJ SOLN
INTRAMUSCULAR | Status: DC | PRN
  Administered 2022-02-07: 100 ug via INTRAVENOUS

## 2022-02-07 MED ORDER — OXYCODONE HCL 5 MG PO TABS: 5.0000 mg | ORAL_TABLET | ORAL | Status: DC | PRN

## 2022-02-07 MED ORDER — SODIUM CHLORIDE (PF) 0.9 % IJ SOLN
INTRAMUSCULAR | Status: DC | PRN
  Administered 2022-02-07: 100 mL via INTRAMUSCULAR

## 2022-02-07 MED ORDER — LIDOCAINE HCL (CARDIAC) PF 100 MG/5ML IV SOSY
PREFILLED_SYRINGE | INTRAVENOUS | Status: DC | PRN
  Administered 2022-02-07: 60 mg via INTRAVENOUS

## 2022-02-07 MED ORDER — IPRATROPIUM-ALBUTEROL 0.5-2.5 (3) MG/3ML IN SOLN
3.0000 mL | Freq: Four times a day (QID) | RESPIRATORY_TRACT | Status: DC
  Administered 2022-02-08 – 2022-02-10 (×12): 3 mL via RESPIRATORY_TRACT
  Filled 2022-02-07 (×12): qty 3

## 2022-02-07 MED ORDER — IPRATROPIUM-ALBUTEROL 0.5-2.5 (3) MG/3ML IN SOLN: 3.0000 mL | RESPIRATORY_TRACT | Status: DC

## 2022-02-07 MED ORDER — TRAMADOL HCL 50 MG PO TABS: 50.0000 mg | ORAL_TABLET | Freq: Four times a day (QID) | ORAL | Status: DC | PRN

## 2022-02-07 MED ORDER — SUCCINYLCHOLINE CHLORIDE 200 MG/10ML IV SOSY
PREFILLED_SYRINGE | INTRAVENOUS | Status: DC | PRN
  Administered 2022-02-07: 80 mg via INTRAVENOUS

## 2022-02-07 MED ORDER — PANTOPRAZOLE SODIUM 40 MG IV SOLR
40.0000 mg | Freq: Every day | INTRAVENOUS | Status: DC
  Administered 2022-02-08 – 2022-02-09 (×3): 40 mg via INTRAVENOUS
  Filled 2022-02-07 (×4): qty 10

## 2022-02-07 SURGICAL SUPPLY — 79 items
ADHESIVE MASTISOL STRL (MISCELLANEOUS) ×1 IMPLANT
BULB RESERV EVAC DRAIN JP 100C (MISCELLANEOUS) ×1 IMPLANT
CELL SAVER LIPIGURD (MISCELLANEOUS) IMPLANT
CHLORAPREP W/TINT 26 (MISCELLANEOUS) ×3 IMPLANT
COVER LIGHT HANDLE STERIS (MISCELLANEOUS) ×1 IMPLANT
DEVICE RETRIEVAL ALEXIS 14 (MISCELLANEOUS) IMPLANT
DRAIN CHANNEL JP 15F RND 16 (MISCELLANEOUS) ×1 IMPLANT
DRAPE LAPAROTOMY 100X77 ABD (DRAPES) ×4 IMPLANT
DRSG OPSITE POSTOP 4X10 (GAUZE/BANDAGES/DRESSINGS) ×3 IMPLANT
DRSG OPSITE POSTOP 4X12 (GAUZE/BANDAGES/DRESSINGS) ×1 IMPLANT
DRSG OPSITE POSTOP 4X8 (GAUZE/BANDAGES/DRESSINGS) ×3 IMPLANT
DRSG TEGADERM 4X10 (GAUZE/BANDAGES/DRESSINGS) IMPLANT
DRSG TEGADERM 4X4.75 (GAUZE/BANDAGES/DRESSINGS) ×1 IMPLANT
DRSG TELFA 3X8 NADH (GAUZE/BANDAGES/DRESSINGS) IMPLANT
ELECT BLADE 6.5 EXT (BLADE) ×4 IMPLANT
ELECT CAUTERY BLADE 6.4 (BLADE) ×4 IMPLANT
ELECT REM PT RETURN 9FT ADLT (ELECTROSURGICAL) ×4
ELECTRODE REM PT RTRN 9FT ADLT (ELECTROSURGICAL) ×3 IMPLANT
EXTRT SYSTEM ALEXIS 14CM (MISCELLANEOUS)
EXTRT SYSTEM ALEXIS 17CM (MISCELLANEOUS)
GAUZE 4X4 16PLY ~~LOC~~+RFID DBL (SPONGE) ×4 IMPLANT
GLOVE BIO SURGEON STRL SZ 6.5 (GLOVE) ×1 IMPLANT
GLOVE BIOGEL PI IND STRL 7.0 (GLOVE) ×9 IMPLANT
GLOVE BIOGEL PI IND STRL 7.5 (GLOVE) IMPLANT
GLOVE BIOGEL PI INDICATOR 7.0 (GLOVE) ×3
GLOVE BIOGEL PI INDICATOR 7.5 (GLOVE) ×1
GLOVE INDICATOR 7.0 STRL GRN (GLOVE) ×1 IMPLANT
GLOVE SURG SYN 6.5 ES PF (GLOVE) ×20 IMPLANT
GLOVE SURG SYN 6.5 PF PI (GLOVE) ×9 IMPLANT
GOWN STRL REUS W/ TWL LRG LVL3 (GOWN DISPOSABLE) ×12 IMPLANT
GOWN STRL REUS W/TWL LRG LVL3 (GOWN DISPOSABLE) ×3
HANDLE SUCTION POOLE (INSTRUMENTS) ×3 IMPLANT
HOLDER FOLEY CATH W/STRAP (MISCELLANEOUS) ×4 IMPLANT
KIT OSTOMY DRAINABLE 2.75 STR (WOUND CARE) ×1 IMPLANT
KIT TURNOVER KIT A (KITS) ×4 IMPLANT
LABEL OR SOLS (LABEL) ×3 IMPLANT
LIGASURE IMPACT 36 18CM CVD LR (INSTRUMENTS) ×4 IMPLANT
LIGHT WAVEGUIDE WIDE FLAT (MISCELLANEOUS) ×3 IMPLANT
MANIFOLD NEPTUNE II (INSTRUMENTS) ×4 IMPLANT
NEEDLE HYPO 22GX1.5 SAFETY (NEEDLE) ×4 IMPLANT
NS IRRIG 1000ML POUR BTL (IV SOLUTION) ×7 IMPLANT
PACK BASIN MAJOR ARMC (MISCELLANEOUS) ×4 IMPLANT
PACK COLON CLEAN CLOSURE (MISCELLANEOUS) ×3 IMPLANT
PAD DRESSING TELFA 3X8 NADH (GAUZE/BANDAGES/DRESSINGS) IMPLANT
RELOAD PROXIMATE 75MM BLUE (ENDOMECHANICALS) IMPLANT
RELOAD STAPLE 75 3.8 BLU REG (ENDOMECHANICALS) IMPLANT
RETRACTOR WND ALEXIS-O 25 LRG (MISCELLANEOUS) IMPLANT
RTRCTR WOUND ALEXIS O 25CM LRG (MISCELLANEOUS) ×4
SOL PREP PVP 2OZ (MISCELLANEOUS) ×4
SOLUTION PREP PVP 2OZ (MISCELLANEOUS) ×3 IMPLANT
SPIKE FLUID TRANSFER (MISCELLANEOUS) ×2 IMPLANT
SPONGE DRAIN TRACH 4X4 STRL 2S (GAUZE/BANDAGES/DRESSINGS) ×1 IMPLANT
SPONGE T-LAP 18X18 ~~LOC~~+RFID (SPONGE) ×15 IMPLANT
STAPLER CVD CUT GN 40 RELOAD (ENDOMECHANICALS) ×4 IMPLANT
STAPLER CVD CUT GRN 40 RELOAD (ENDOMECHANICALS) IMPLANT
STAPLER PROXIMATE 75MM BLUE (STAPLE) ×1 IMPLANT
STAPLER SKIN PROX 35W (STAPLE) ×3 IMPLANT
SUCTION POOLE HANDLE (INSTRUMENTS) ×4
SURGILUBE 2OZ TUBE FLIPTOP (MISCELLANEOUS) IMPLANT
SUT ETHILON 3-0 FS-10 30 BLK (SUTURE) ×8
SUT PDS AB 1 TP1 54 (SUTURE) ×8 IMPLANT
SUT PROLENE 2 0 SH DA (SUTURE) ×1 IMPLANT
SUT SILK 2 0 (SUTURE) ×1
SUT SILK 2-0 18XBRD TIE 12 (SUTURE) ×3 IMPLANT
SUT SILK 3 0 (SUTURE) ×1
SUT SILK 3-0 (SUTURE) ×4 IMPLANT
SUT SILK 3-0 18XBRD TIE 12 (SUTURE) ×3 IMPLANT
SUT VIC AB 3-0 SH 27 (SUTURE) ×2
SUT VIC AB 3-0 SH 27X BRD (SUTURE) ×6 IMPLANT
SUT VIC AB 3-0 SH 8-18 (SUTURE) ×1 IMPLANT
SUTURE EHLN 3-0 FS-10 30 BLK (SUTURE) IMPLANT
SWAB CULTURE AMIES ANAERIB BLU (MISCELLANEOUS) ×1 IMPLANT
SYR 20ML LL LF (SYRINGE) ×4 IMPLANT
SYR 30ML LL (SYRINGE) ×1 IMPLANT
SYSTEM CONTND EXTRCTN KII BLLN (MISCELLANEOUS) IMPLANT
TOWEL OR 17X26 4PK STRL BLUE (TOWEL DISPOSABLE) ×4 IMPLANT
TRAY FOLEY MTR SLVR 16FR STAT (SET/KITS/TRAYS/PACK) ×4 IMPLANT
TRAY FOLEY SLVR 16FR LF STAT (SET/KITS/TRAYS/PACK) ×3 IMPLANT
WATER STERILE IRR 500ML POUR (IV SOLUTION) ×4 IMPLANT

## 2022-02-07 NOTE — Consult Note (Cosign Needed Addendum)
NAME:  Nichole Park, MRN:  [id number], DOB:  [birth year], LOS: 0 ADMISSION DATE:  02/27/2022, CONSULTATION DATE: 02/15/2022 REFERRING MD: Dr. Lysle Pearl  CHIEF COMPLAINT:  Hypotension   History of Present Illness:  This is a 61 yo female with a PMH of Stage IV Adenocarcinoma (most recent oncology outpatient progress note on 01/31/22 concern of possible progression of disease despite alimta keytruda maintenance therapy.  Therefore, maintenance therapy discontinued and discussed subsequent line therapy with understanding treatment would be palliative not curative.    She presented to [facility] ER on 06/1 from oncology clinic with a 3 day hx of worsening weakness, bilateral lower extremity weakness, abdominal pain, and confusion.  She also endorsed worsening shortness of breath.  Lab results revealed K+ 127, chloride 95, CO2 18, glucose 429, BUN 27, LDH 311, lactic acid 6.2, wbc 12.0, platelets 95, and fibrinogen 705.  CT Abd/Pelvis revealed perforated sigmoid diverticulitis with diffuse pneumoperitoneum with small gas/fluid collection at the site of diverticulitis and LLL pneumonia.  ER physician consulted General Surgeon Dr. Lysle Pearl and pt transported to OR for exploratory laparotomy/Hartman's procedure.  Pt also started on insulin gtt due to DKA.  During exploratory laparotomy, hartmans procedure pt had bleeding in the left adnexa requiring open left salpingectomy performed by OBGYN Dr. Leafy Ro.  Postop pt became hypotensive requiring neo-synephrine gtt PCCM consulted to assist with management.    Pertinent  Medical History  Grave's Disease  Malignant Pericardial Effusion  Paroxysmal Atrial Fibrillation  Stage IV Adenocarcinoma  Recurrent Sub centimeter Brain Lesions s/p SRS Iatrogenic Hypothyroidism  Hyperglycemia secondary to steroids/Myopathy   Thrombocytopenia  COPD  Significant Hospital Events: Including procedures, antibiotic start and stop dates in addition to other pertinent events   06/1: Pt admitted  to ICU with DKA and septic shock secondary perforated sigmoid diverticulitis with diffuse pneumoperitoneum and LLL pneumonia s/p exploratory laparotomy/Hartman's found to have bleeding in the left adnexa requiring open left salpingectomy by Dr. Leafy Ro.  Postop developed hypotension requiring neo-synephrine gtt PCCM consulted to assist with management 06/1: CT Abd Pelvis revealed acute perforated sigmoid         diverticulitis as above. Diffuse pneumoperitoneum, with small        gas/fluid collection at the site of diverticulitis. No rim        enhancement to suggest abscess at this time. Left lower lobe        consolidation, favor pneumonia. Please see previous chest x-ray        discussion. Stable findings of a static lung cancer, with        mediastinal adenopathy and right lower lobe nodularity        unchanged. Please see recent chest CT 12/31/2021 full        description. Hepatic steatosis. Aortic Atherosclerosis       (ICD10-I70.0). 06/1: CT Head revealed LEFT parietal white matter vasogenic        edema due to underlying known metastatic lesion as       demonstrated by prior MR. No new intracranial abnormalities.  Micro Data:  Aerobic/Anaerobic 06/1>> MRSA PCR 06/1>> COVID-19 by PCR 06/1>>negative  Blood x2 06/1>>  Antimicrobials:  Cefepime 06/1>> Vancomycin 06/1>> Flagyl 06/1>>  Interim History / Subjective:  Pt with increased work of breathing and wheezing throughout.  Pt also hypotensive sbp 70's despite iv fluid resuscitation, therefore neo-synephrine gtt initiated   Objective   Blood pressure 106/81, pulse 96, temperature 98.8 F (37.1 C), temperature source Rectal, resp. rate (!) 25, height 5'  1" (1.549 m), weight 59 kg, SpO2 100 %.        Intake/Output Summary (Last 24 hours) at 03/08/2022 2224 Last data filed at 03/07/2022 2209 Gross per 24 hour  Intake 3500 ml  Output 300 ml  Net 3200 ml   Filed Weights   02/14/2022 1536  Weight: 59 kg   Examination: General:  Acute on chronically ill appearing female, in respiratory distress on NRB  HENT: Supple, no JVD present  Lungs: Faint wheezes throughout, tachypneic with use of accessory muscles to breath  Cardiovascular: NSR, rrr, no r/g, 2+ radial/1+ distal pulses, generalized edema present  Abdomen: no audible BS present, tenderness present     Extremities: Moves all extremities  Neuro: Lethargic oriented, follows commands, PERRLA GU: Indwelling foley catheter draining clear yellow urine   Resolved Hospital Problem list     Assessment & Plan:   Acute on chronic hypoxic respiratory failure secondary to LLL pneumonia, metabolic acidosis in the setting of DKA, and stage IV adenocarcinoma of the lung COPD  - Supplemental O2 for dyspnea and/or hypoxia  - Scheduled and prn bronchodilator therapy  - Nebulized steroids  - Continue abx as outlined above  - Aggressive pulmonary hygiene  Septic shock secondary to perforated sigmoid diverticulitis with diffuse pneumoperitoneum and LLL pneumonia s/p exploratory laparotomy/Hartman's found to have bleeding in the left adnexa requiring open left salpingectomy~006/12/2021 - Continuous telemetry monitoring  - Aggressive iv fluid resuscitation and prn neo-synephrine gtt to maintain map >65 - Continue abx therapy as outlined above - General surgery consulted appreciate input: dressing changes to midline abdominal incisions per recommendations  - NGT to LIS   Metabolic acidosis and lactic acidosis in setting of sepsis and DKA  Hyponatremia secondary to volume depletion  - Trend BMP, lactic acid, and VBG - Replace electrolytes as indicated  - Monitor UOP - Avoid nephrotoxic medications  - 2 amps of sodium bicarb   Diabetic ketoacidosis  - Will recheck BMP if ruled in for DKA will restart insulin gtt and DKA protocol   Chronic thrombocytopenia  - Trend CBC  - Monitor for s/sx of bleeding - Transfuse for platelet count <50 or signs of bleeding and/or hgb <7 -  Avoid chemical VTE px   Postop pain  - Prn oxycodone, tramadol, and dilaudid for pain management   Best Practice (right click and "Reselect all SmartList Selections" daily)   Diet/type: NPO DVT prophylaxis: SCD GI prophylaxis: PPI Lines: N/A Foley:  Yes, and it is still needed Code Status:  full code Last date of multidisciplinary goals of care discussion [N/A]  Will place palliative care consult to discuss goals of treatment and code status  Labs   CBC: Recent Labs  Lab 02/08/2022 1347 03/05/2022 1610  WBC 12.0* 9.0  NEUTROABS 9.3* 6.9  HGB 14.6 14.7  HCT 41.3 43.6  MCV 92.8 94.6  PLT 95* 94*    Basic Metabolic Panel: Recent Labs  Lab 02/26/2022 1347 02/27/2022 1610  NA 127* 132*  K 4.6 4.4  CL 95* 98  CO2 18* 17*  GLUCOSE 429* 441*  BUN 27* 31*  CREATININE 0.71 0.77  CALCIUM 9.4 9.7   GFR: Estimated Creatinine Clearance: 63.3 mL/min (by C-G formula based on SCr of 0.77 mg/dL). Recent Labs  Lab 03/06/2022 1347 03/04/2022 1610 03/01/2022 1810  WBC 12.0* 9.0  --   LATICACIDVEN  --  6.2* 3.5*    Liver Function Tests: Recent Labs  Lab 03/04/2022 1610  AST 56*  ALT 61*  ALKPHOS 122  BILITOT 1.0  PROT 6.0*  ALBUMIN 2.1*   No results for input(s): LIPASE, AMYLASE in the last 168 hours. No results for input(s): AMMONIA in the last 168 hours.  ABG    Component Value Date/Time   PHART 7.54 (H) 04/01/2021 2109   PCO2ART 44 04/01/2021 2109   PO2ART 78 (L) 04/01/2021 2109   HCO3 37.6 (H) 04/01/2021 2109   O2SAT 96.8 04/01/2021 2109     Coagulation Profile: Recent Labs  Lab 02/23/2022 1347  INR 1.1    Cardiac Enzymes: No results for input(s): CKTOTAL, CKMB, CKMBINDEX, TROPONINI in the last 168 hours.  HbA1C: Hgb A1c MFr Bld  Date/Time Value Ref Range Status  01/20/2022 05:00 PM 7.3 (H) 4.8 - 5.6 % Final    Comment:    (NOTE) Pre diabetes:          5.7%-6.4%  Diabetes:              >6.4%  Glycemic control for   <7.0% adults with diabetes    01/18/2022 08:03 PM 7.1 (H) 4.8 - 5.6 % Final    Comment:    (NOTE) Pre diabetes:          5.7%-6.4%  Diabetes:              >6.4%  Glycemic control for   <7.0% adults with diabetes     CBG: Recent Labs  Lab 02/16/2022 2016 02/16/2022 2209  GLUCAP 291* 266*    Review of Systems: Positives in BOLD   Gen: Denies fever, chills, weight change, fatigue, night sweats HEENT: Denies blurred vision, double vision, hearing loss, tinnitus, sinus congestion, rhinorrhea, sore throat, neck stiffness, dysphagia PULM: Denies shortness of breath, cough, sputum production, hemoptysis, wheezing CV: Denies chest pain, edema, orthopnea, paroxysmal nocturnal dyspnea, palpitations GI: abdominal pain, nausea, vomiting, diarrhea, hematochezia, melena, constipation, change in bowel habits GU: Denies dysuria, hematuria, polyuria, oliguria, urethral discharge Endocrine: Denies hot or cold intolerance, polyuria, polyphagia or appetite change Derm: Denies rash, dry skin, scaling or peeling skin change Heme: Denies easy bruising, bleeding, bleeding gums Neuro: Denies headache, numbness, weakness, slurred speech, loss of memory or consciousness   Past Medical History:  She,  has a past medical history of Graves disease, Malignant pericardial effusion, Paroxysmal atrial fibrillation ([place]), and Primary lung adenocarcinoma ([place]).   Surgical History:   Past Surgical History:  Procedure Laterality Date   IR CV LINE INJECTION  06/19/2021   IR IMAGING GUIDED PORT INSERTION  04/27/2021   PERICARDIOCENTESIS N/A 03/27/2021   Procedure: PERICARDIOCENTESIS;  Surgeon: Nelva Bush, MD;  Location: [place] [facility];  Service: Cardiovascular;  Laterality: N/A;   TUBAL LIGATION       Social History:   reports that she has quit smoking. Her smoking use included cigarettes. She has a 40.00 pack-year smoking history. She has never used smokeless tobacco. She reports that she does not currently use drugs after having  used the following drugs: Marijuana. She reports that she does not drink alcohol.   Family History:  Her family history includes Peripheral Artery Disease in her mother. There is no history of Breast cancer.   Allergies Allergies  Allergen Reactions   Diazepam Other (See Comments)    Very high emotionally and very low emotionally, hyperventilate and passes out Very high emotionally and very low emotionally, hyperventilate and passes out    Keytruda [Pembrolizumab] Rash   Metoprolol    Tapazole [Methimazole]      Home Medications  Prior to Admission medications   Medication Sig Start Date End Date Taking? Authorizing Provider  bismuth subsalicylate (PEPTO BISMOL) 262 MG/15ML suspension Take 30 mLs by mouth every 6 (six) hours as needed. Patient not taking: Reported on 02/19/2022    [provider]  dexamethasone (DECADRON) 2 MG tablet Take 1 tablet (2 mg total) by mouth 2 (two) times daily. 01/21/22 02/20/22  Enzo Bi, MD  diltiazem (CARDIZEM CD) 180 MG 24 hr capsule Take 1 capsule (180 mg total) by mouth daily. 11/28/21 02/26/22  Rise Mu, PA-C  folic acid (FOLVITE) 1 MG tablet Take 1 tablet (1 mg total) by mouth once daily. 10/15/21   Cammie Sickle, MD  insulin glargine (LANTUS) 100 UNIT/ML Solostar Pen Inject 10 Units into the skin at bedtime. 01/21/22 04/21/22  Enzo Bi, MD  Insulin Pen Needle 31G X 6 MM MISC 1 Units by Does not apply route at bedtime. 01/21/22   Enzo Bi, MD  metFORMIN (GLUCOPHAGE) 500 MG tablet Take 2 tablets (1,000 mg total) by mouth 2 (two) times daily with a meal. 01/31/22   Cammie Sickle, MD  Multiple Vitamin (MULTI-VITAMINS) TABS Take 1 tablet by mouth daily. Patient not taking: Reported on 02/13/2022    [provider]  ondansetron (ZOFRAN) 4 MG tablet Take 2 tablets (8mg  total) by mouth once every 8 hours as needed for nausea and vomiting. 04/16/21   Cammie Sickle, MD  TURMERIC PO Take 1 tablet by mouth daily. Patient not  taking: Reported on 02/15/2022    [provider]     Critical care time: 60 minutes      Donell Beers, [place] Pager [phone] (please enter 7 digits) PCCM Consult Pager [phone] (please enter 7 digits)

## 2022-02-07 NOTE — Progress Notes (Signed)
Pharmacist Chemotherapy Monitoring - Initial Assessment    Anticipated start date: 02/14/22   The following has been reviewed per standard work regarding the patient's treatment regimen: The patient's diagnosis, treatment plan and drug doses, and organ/hematologic function Lab orders and baseline tests specific to treatment regimen  The treatment plan start date, drug sequencing, and pre-medications Prior authorization status  Patient's documented medication list, including drug-drug interaction screen and prescriptions for anti-emetics and supportive care specific to the treatment regimen The drug concentrations, fluid compatibility, administration routes, and timing of the medications to be used The patient's access for treatment and lifetime cumulative dose history, if applicable  The patient's medication allergies and previous infusion related reactions, if applicable   Changes made to treatment plan:  treatment plan date  Follow up needed:  signing treatment plan   Adelina Mings, [facility], 02/13/2022  12:05 PM

## 2022-02-07 NOTE — Anesthesia Preprocedure Evaluation (Signed)
Anesthesia Evaluation  Patient identified by MRN, date of birth, ID band Patient awake  General Assessment Comment:  Perforated bowel. Denies nausea or vomiting. Had bowel movement today  Reviewed: Allergy & Precautions, NPO status , Patient's Chart, lab work & pertinent test results  History of Anesthesia Complications Negative for: history of anesthetic complications  Airway Mallampati: III  TM Distance: >3 FB Neck ROM: Full    Dental  (+) Poor Dentition, Chipped   Pulmonary neg sleep apnea, COPD, Patient abstained from smoking.Not current smoker, former smoker,  Metastatic lung cancer   Pulmonary exam normal breath sounds clear to auscultation       Cardiovascular Exercise Tolerance: Poor METShypertension, Pt. on medications (-) CAD and (-) Past MI + dysrhythmias Atrial Fibrillation  Rhythm:Regular Rate:Normal - Systolic murmurs Hx pericarditis and pericardial effusion (malignant). Recent cardiovascular status OK per daughter, sees cardiologist regularly   Neuro/Psych negative neurological ROS  negative psych ROS   GI/Hepatic neg GERD  ,(+)     (-) substance abuse  ,   Endo/Other  neg diabetesHypothyroidism   Renal/GU negative Renal ROS     Musculoskeletal   Abdominal   Peds  Hematology   Anesthesia Other Findings Past Medical History: No date: Graves disease No date: Malignant pericardial effusion No date: Paroxysmal atrial fibrillation (HCC) No date: Primary lung adenocarcinoma (HCC)  Reproductive/Obstetrics                             Anesthesia Physical Anesthesia Plan  ASA: 4 and emergent  Anesthesia Plan: General   Post-op Pain Management: Ofirmev IV (intra-op)* and Dilaudid IV   Induction: Intravenous and Rapid sequence  PONV Risk Score and Plan: 4 or greater and Ondansetron, Dexamethasone and Treatment may vary due to age or medical condition  Airway Management  Planned: Oral ETT and Video Laryngoscope Planned  Additional Equipment: Arterial line  Intra-op Plan:   Post-operative Plan: Extubation in OR and Possible Post-op intubation/ventilation  Informed Consent: I have reviewed the patients History and Physical, chart, labs and discussed the procedure including the risks, benefits and alternatives for the proposed anesthesia with the patient or authorized representative who has indicated his/her understanding and acceptance.     Dental advisory given  Plan Discussed with: CRNA and Surgeon  Anesthesia Plan Comments: (Discussed risks of anesthesia with patient and daughter at bedside, including PONV, sore throat, lip/dental/eye damage. Rare risks discussed as well, such as cardiorespiratory and neurological sequelae, and allergic reactions. Discussed the role of CRNA in patient's perioperative care. Patient understands. Patient counseled on being higher risk for anesthesia due to comorbidities: metastatic lung cancer, frailty. Patient was told about increased risk of cardiac and respiratory events, including death. )        Anesthesia Quick Evaluation

## 2022-02-07 NOTE — ED Notes (Signed)
Report given to OR RN. PT A&ox4, NADN, pt critically stable. . OR MD spoke with pt and consent signed by daughter. Pt taken to OR

## 2022-02-07 NOTE — Progress Notes (Signed)
eLink Physician-Brief Progress Note Patient Name: TEYAH ROSSY DOB: 05-Nov-1960 MRN: [id number]   Date of Service  03/02/2022  HPI/Events of Note  Patient arrived to ICU post op. Has had general surgery and Ob GYN operate on her for perforated diverticulitis as well as adnexal bleeding. PCCM consulted and their note is pending. Currently not in overt distress on nasal o2. Seen on camera HR in 90s. SBP 100s.   eICU Interventions  Given vanc and cefepime, would suggest adding flagyl to cover anaerobes Hydration Trend labs including lactate Insulin drip for glucose control  Monitor serial labs while on IV insulin Pressors to maintain map > 65 Overall advanced cancer, may need discussion for goals of care in AM  DVT prophylaxis with anticoagulation when ok with surgery D/w ICU NP Please call if needed     Intervention Category Major Interventions: Sepsis - evaluation and management;Shock - evaluation and management Evaluation Type: New Patient Evaluation  Margaretmary Lombard 02/27/2022, 11:27 PM

## 2022-02-07 NOTE — Transfer of Care (Signed)
Immediate Anesthesia Transfer of Care Note  Patient: Nichole Park  Procedure(s) Performed: EXPLORATORY LAPAROTOMY COLECTOMY WITH COLOSTOMY CREATION/HARTMANN PROCEDURE OPEN LEFT SALPINGECTOMY (Left: Abdomen) EXAM UNDER ANESTHESIA  Patient Location: PACU  Anesthesia Type:General  Level of Consciousness: awake  Airway & Oxygen Therapy: Patient Spontanous Breathing and Patient connected to nasal cannula oxygen  Post-op Assessment: Report given to RN and Post -op Vital signs reviewed and stable  Post vital signs: Reviewed and stable  Last Vitals:  Vitals Value Taken Time  BP 91/82 02/13/2022 2208  Temp    Pulse 98 03/08/2022 2208  Resp 20 02/19/2022 2211  SpO2 82 % 02/21/2022 2208  Vitals shown include unvalidated device data.  Last Pain:  Vitals:   02/12/2022 1555  TempSrc: Rectal  PainSc:          Complications: No notable events documented.

## 2022-02-07 NOTE — H&P (Signed)
Subjective:   CC: abdominal pain  HPI:  Nichole Park is a 61 y.o. female who was consulted by Archie Balboa for issue above.  Symptoms were first noted 3 days ago. Pain is diffuse, abdominal.  Associated with nothing, exacerbated by nothing     Past Medical History:  has a past medical history of Graves disease, Malignant pericardial effusion, Paroxysmal atrial fibrillation ([place]), and Primary lung adenocarcinoma ([place]).  Past Surgical History:  Past Surgical History:  Procedure Laterality Date   IR CV LINE INJECTION  06/19/2021   IR IMAGING GUIDED PORT INSERTION  04/27/2021   PERICARDIOCENTESIS N/A 03/27/2021   Procedure: PERICARDIOCENTESIS;  Surgeon: Nelva Bush, MD;  Location: [facility];  Service: Cardiovascular;  Laterality: N/A;   TUBAL LIGATION      Family History: family history includes Peripheral Artery Disease in her mother.  Social History:  reports that she has quit smoking. Her smoking use included cigarettes. She has a 40.00 pack-year smoking history. She has never used smokeless tobacco. She reports that she does not currently use drugs after having used the following drugs: Marijuana. She reports that she does not drink alcohol.  Current Medications:  Prior to Admission medications   Medication Sig Start Date End Date Taking? Authorizing Provider  bismuth subsalicylate (PEPTO BISMOL) 262 MG/15ML suspension Take 30 mLs by mouth every 6 (six) hours as needed. Patient not taking: Reported on 03/05/2022    [provider]  dexamethasone (DECADRON) 2 MG tablet Take 1 tablet (2 mg total) by mouth 2 (two) times daily. 01/21/22 02/20/22  Enzo Bi, MD  diltiazem (CARDIZEM CD) 180 MG 24 hr capsule Take 1 capsule (180 mg total) by mouth daily. 11/28/21 02/26/22  Rise Mu, PA-C  folic acid (FOLVITE) 1 MG tablet Take 1 tablet (1 mg total) by mouth once daily. 10/15/21   Cammie Sickle, MD  insulin glargine (LANTUS) 100 UNIT/ML Solostar Pen Inject 10 Units into the  skin at bedtime. 01/21/22 04/21/22  Enzo Bi, MD  Insulin Pen Needle 31G X 6 MM MISC 1 Units by Does not apply route at bedtime. 01/21/22   Enzo Bi, MD  metFORMIN (GLUCOPHAGE) 500 MG tablet Take 2 tablets (1,000 mg total) by mouth 2 (two) times daily with a meal. 01/31/22   Cammie Sickle, MD  Multiple Vitamin (MULTI-VITAMINS) TABS Take 1 tablet by mouth daily. Patient not taking: Reported on 02/26/2022    [provider]  ondansetron (ZOFRAN) 4 MG tablet Take 2 tablets (8mg  total) by mouth once every 8 hours as needed for nausea and vomiting. 04/16/21   Cammie Sickle, MD  TURMERIC PO Take 1 tablet by mouth daily. Patient not taking: Reported on 02/15/2022    [provider]    Allergies:  Allergies as of 02/09/2022 - Review Complete 02/18/2022  Allergen Reaction Noted   Diazepam Other (See Comments) 12/07/2013   Keytruda [pembrolizumab] Rash 12/28/2021   Metoprolol  03/19/2017   Tapazole [methimazole]  03/19/2017    ROS:  General: Denies weight loss, weight gain, fatigue, fevers, chills, and night sweats. Eyes: Denies blurry vision, double vision, eye pain, itchy eyes, and tearing. Ears: Denies hearing loss, earache, and ringing in ears. Nose: Denies sinus pain, congestion, infections, runny nose, and nosebleeds. Mouth/throat: Denies hoarseness, sore throat, bleeding gums, and difficulty swallowing. Heart: Denies chest pain, palpitations, racing heart, irregular heartbeat, leg pain or swelling, and decreased activity tolerance. Respiratory: Denies breathing difficulty, shortness of breath, wheezing, cough, and sputum. GI: Denies  change in appetite, heartburn, nausea, vomiting, constipation, diarrhea, and blood in stool. GU: Denies difficulty urinating, pain with urinating, urgency, frequency, blood in urine. Musculoskeletal: Denies joint stiffness, pain, swelling, muscle weakness. Skin: Denies rash, itching, mass, tumors, sores, and boils Neurologic: Denies  headache, fainting, dizziness, seizures, numbness, and tingling. Psychiatric: Denies depression, anxiety, difficulty sleeping, and memory loss. Endocrine: Denies heat or cold intolerance, and increased thirst or urination. Blood/lymph: Denies easy bruising, easy bruising, and swollen glands     Objective:     BP (!) 142/99   Pulse 95   Temp 98.8 F (37.1 C) (Rectal)   Resp (!) 32   Ht 5\' 1"  (1.549 m)   Wt 59 kg   SpO2 97%   BMI 24.56 kg/m   Constitutional :  alert, cooperative, and moderate distress  Lymphatics/Throat:  no asymmetry, masses, or scars  Respiratory:  clear to auscultation bilaterally  Cardiovascular:  regular rate and rhythm  Gastrointestinal: Diffuse guarding and TTP all four quadrants, consistent with acute abdomen .   Musculoskeletal: Steady movement  Skin: Cool and moist, no surgical scars   Psychiatric: Normal affect, non-agitated, not confused       LABS:     Latest Ref Rng & Units 02/20/2022    4:10 PM 02/09/2022    1:47 PM 01/31/2022    8:50 AM  CMP  Glucose 70 - 99 mg/dL 441   429   416    BUN 6 - 20 mg/dL 31   27   25     Creatinine 0.44 - 1.00 mg/dL 0.77   0.71   0.59    Sodium 135 - 145 mmol/L 132   127   128    Potassium 3.5 - 5.1 mmol/L 4.4   4.6   4.8    Chloride 98 - 111 mmol/L 98   95   97    CO2 22 - 32 mmol/L 17   18   22     Calcium 8.9 - 10.3 mg/dL 9.7   9.4   9.2    Total Protein 6.5 - 8.1 g/dL 6.0    6.3    Total Bilirubin 0.3 - 1.2 mg/dL 1.0    0.9    Alkaline Phos 38 - 126 U/L 122    75    AST 15 - 41 U/L 56    33    ALT 0 - 44 U/L 61    96        Latest Ref Rng & Units 02/25/2022    4:10 PM 03/06/2022    1:47 PM 01/31/2022    8:50 AM  CBC  WBC 4.0 - 10.5 K/uL 9.0   12.0   2.9    Hemoglobin 12.0 - 15.0 g/dL 14.7   14.6   15.3    Hematocrit 36.0 - 46.0 % 43.6   41.3   43.0    Platelets 150 - 400 K/uL 94   95   78      RADS: CLINICAL DATA:  Abdominal pain, lower extremity swelling, short of breath worsening over last 3 days,  history of lung cancer   EXAM: CT ABDOMEN AND PELVIS WITH CONTRAST   TECHNIQUE: Multidetector CT imaging of the abdomen and pelvis was performed using the standard protocol following bolus administration of intravenous contrast.   RADIATION DOSE REDUCTION: This exam was performed according to the departmental dose-optimization program which includes automated exposure control, adjustment of the mA and/or kV according to patient size and/or use  of iterative reconstruction technique.   CONTRAST:  137mL OMNIPAQUE IOHEXOL 300 MG/ML  SOLN   COMPARISON:  12/31/2021, 10/01/2021, 03/06/2022   FINDINGS: Lower chest: Dense left lower lobe consolidation favor pneumonia given rapid development since prior exams. Nodularity within the right middle and right lower lobe consistent with known metastatic lung cancer. Necrotic appearing 1 cm lymph node anterior to the aorta in the left infrahilar region unchanged.   Hepatobiliary: Hepatic steatosis. No focal liver abnormality. Gallbladder is unremarkable. No biliary duct dilation.   Pancreas: Unremarkable. No pancreatic ductal dilatation or surrounding inflammatory changes.   Spleen: Normal in size without focal abnormality.   Adrenals/Urinary Tract: Stable appearance of the kidneys. No urinary tract calculi or obstruction. The adrenals and bladder are unremarkable.   Stomach/Bowel: There is distal colonic diverticulosis, with wall thickening and fat stranding of the sigmoid colon consistent with acute diverticulitis. There is free fluid in free gas adjacent to the inflamed sigmoid colon consistent with perforation. Small multilocular gas and fluid collection along the left pelvic sidewall measures up to 2.1 x 2.9 cm, without rim enhancement to suggest abscess at this time.   No bowel obstruction or ileus. Mild wall thickening of the small bowel within the lower central abdomen adjacent to the perforated sigmoid colon likely secondary  inflammation.   Vascular/Lymphatic: Stable aortic atherosclerosis. No pathologic adenopathy.   Reproductive: Uterus and bilateral adnexa are unremarkable.   Other: Pneumoperitoneum from perforated sigmoid diverticulitis as above. Small amount of free fluid within the lower abdomen, with localized gas/fluid collection adjacent to the sigmoid diverticulitis as described above.   No evidence of abdominal wall hernia.   Musculoskeletal: No acute or destructive bony lesions. No acute or destructive bony scotch that reconstructed images demonstrate no additional findings.   IMPRESSION: 1. Acute perforated sigmoid diverticulitis as above. Diffuse pneumoperitoneum, with small gas/fluid collection at the site of diverticulitis. No rim enhancement to suggest abscess at this time. 2. Left lower lobe consolidation, favor pneumonia. Please see previous chest x-ray discussion. 3. Stable findings of a static lung cancer, with mediastinal adenopathy and right lower lobe nodularity unchanged. Please see recent chest CT 12/31/2021 full description. 4. Hepatic steatosis. 5.  Aortic Atherosclerosis (ICD10-I70.0).   Critical Value/emergent results were called by telephone at the time of interpretation on 02/22/2022 at 5:05 pm to provider [facility] , who verbally acknowledged these results.     Electronically Signed   By: Randa Ngo M.D.   On: 03/02/2022 17:05 Assessment:   Perforated diverticulitis with extensive inflammation, free air, clinical exam consistent with acute abdomen and septic shock  Stage IV lung CA, currently on treatment, chronic steroid use  New onset LLL PNA  Thrombocytopenia  Lactic acidosis  Hyperglycemia  Plan:    Non-surgical management likely will not suffice.  Unfortunately, her comorbid conditions make her a extremely high risk of complications, up to and including death as noted below. The risk of surgery include, but not limited to, recurrence,  bleeding, chronic pain, post-op infxn, post-op SBO or ileus, hernias, and need for re-operation to address said risks. The risks of general anesthetic, if used, includes MI, CVA, sudden death or even reaction to anesthetic medications also discussed. Alternatives include continued observation and NG decompression.  Benefits include possible symptom relief, preventing further decline in health and possible death.  Typical post-op recovery time of additional days in hospital for observation afterwards also discussed, along with needs for likely permanent ostomy due to her comorbidities  The patient and daughter  verbalized understanding and all questions were answered to the patient's satisfaction. They specifically stated they are not ready to discuss palliative care, since there are not aware of what their prognosis is from her stage IV lung CA.  They area aware the patient may not every recover from this surgery, and even if she does, we are expecting a very prolonged recovery.  Both again verbalized understanding, still wishes to proceed if there is even a small chance of recovery from this illness by proceeding with surgery.  To OR emergently for ex-lap, hartmans.  Type and screen, abx , IVF, in the meantime.  labs/images/medications/previous chart entries reviewed personally and relevant changes/updates noted above.

## 2022-02-07 NOTE — ED Notes (Signed)
Blood band on pt. Blood type and screen sent to lab for OR.

## 2022-02-07 NOTE — Progress Notes (Signed)
Pt and daughter in for follow up, reports no appetite, confusion, severe abdominal pain, swelling in hands and feet bilaterally, short of breath.  Reports has worsened in the last 3 days.

## 2022-02-07 NOTE — Progress Notes (Signed)
IV consult completed. Pt w/ 2 IV access. PAC and L AC. IV meds compatible.Informed unit RN.

## 2022-02-07 NOTE — Progress Notes (Signed)
Symptom Management [place] at [facility] [phone] [phone]) [phone]  Patient Care Team: [place] as PCP - General End, Harrell Gave, MD as PCP - Cardiology (Cardiology) Telford Nab, RN as Oncology Nurse Navigator Cammie Sickle, MD as Consulting Physician (Oncology)   Name of the patient: Nichole Park  [id number]  [birth year]   Date of visit: 02/09/2022  Reason for Consult: Nichole Park is a 61 y.o. female who presents today for:  Patient presents today in a wheelchair accompanied by her daughter.  They state that the past 3 days she has "gone downhill".  She is having progressive weakness, trouble moving her lower legs, significant abdominal pain, confusion.  She also is feeling more shortness of breath. No changes to medications. NO fevers, vomiting, diarrhea, constipation.      PAST MEDICAL HISTORY: Past Medical History:  Diagnosis Date   Graves disease    Malignant pericardial effusion    Paroxysmal atrial fibrillation ([place])    Primary lung adenocarcinoma ([place])     PAST SURGICAL HISTORY:  Past Surgical History:  Procedure Laterality Date   IR CV LINE INJECTION  06/19/2021   IR IMAGING GUIDED PORT INSERTION  04/27/2021   PERICARDIOCENTESIS N/A 03/27/2021   Procedure: PERICARDIOCENTESIS;  Surgeon: Nelva Bush, MD;  Location: [place] [facility];  Service: Cardiovascular;  Laterality: N/A;   TUBAL LIGATION      HEMATOLOGY/ONCOLOGY HISTORY:  Oncology History Overview Note  # AUG 2022-stage IV adenocarcinoma of the lung [s/p pericardial effusion/tamponade]. MRI Brain AUG 16th, 2022- IMPRESSION: Multiple peripherally enhancing, centrally T1 hypointense lesions, with surrounding T2 hyperintense signal in the bilateral cerebral hemispheres, concerning for metastatic disease.    # AUG 17th 2022- CARBO-ALIMTA-Keytruda #1 x4 followed by Alimta Keytruda maintenance ; May 2023-CT scan chest and  pelvis progression of disease  # Mammogram- FEB 2023-no lesions noted in the breasts; 1 to 2 cm lymph node left underarm;. LYMPH NODE, LEFT AXILLARY; ULTRASOUND-GUIDED CORE NEEDLE BIOPSY:  - POSITIVE FOR MALIGNANCY.  - METASTATIC ADENOCARCINOMA, COMPATIBLE WITH PULMONARY PRIMARY   # Repeat MRI brain- 11/22 Mildly increased size of a left parietal metastasis with increased, mild edema. Decreased size of 2 other metastases. S/p SRS- finished on DEC 23rd, 2022- MRI FEB 2023-improved brain lesions;    #April 2023-worsening brain metastases s/p SBRT [facility] 18th, 2023]  # JAN 2023-iatrogenic hypothyroidism- TSH 83; synthroid 122mg  #Acute respiratory failure BiPAP/ICU.  S/p pericardiocentesis.  NGS; PDL-1-0    Malignant pericardial effusion  03/28/2021 Initial Diagnosis   Malignant pericardial effusion ([place]    04/25/2021 - 12/28/2021 Chemotherapy   Patient is on Treatment Plan : LUNG CARBOplatin / Pemetrexed / Pembrolizumab q21d Induction x 4 cycles / Maintenance Pemetrexed + Pembrolizumab      02/14/2022 -  Chemotherapy   Patient is on Treatment Plan : LUNG Docetaxel + Ramucirumab q21d       Cancer of upper lobe of right lung ([place]  04/10/2021 Initial Diagnosis   [place] of right lung [place]    04/10/2021 Cancer Staging   Staging form: Lung, AJCC 8th Edition - Clinical: Stage IVA (cT2, cN2, cM1a) - Signed by BCammie Sickle MD on 04/10/2021    02/14/2022 -  Chemotherapy   Patient is on Treatment Plan : LUNG Docetaxel + Ramucirumab q21d         ALLERGIES:  is allergic to diazepam, keytruda [pembrolizumab], metoprolol, and tapazole [methimazole].  MEDICATIONS:  Current Outpatient Medications  Medication  Sig Dispense Refill   dexamethasone (DECADRON) 2 MG tablet Take 1 tablet (2 mg total) by mouth 2 (two) times daily. 60 tablet 0   diltiazem (CARDIZEM CD) 180 MG 24 hr capsule Take 1 capsule (180 mg total) by mouth daily. 90 capsule 3   folic acid (FOLVITE) 1 MG  tablet Take 1 tablet (1 mg total) by mouth once daily. 90 tablet 1   insulin glargine (LANTUS) 100 UNIT/ML Solostar Pen Inject 10 Units into the skin at bedtime. 3 mL 2   Insulin Pen Needle 31G X 6 MM MISC 1 Units by Does not apply route at bedtime. 30 each 2   metFORMIN (GLUCOPHAGE) 500 MG tablet Take 2 tablets (1,000 mg total) by mouth 2 (two) times daily with a meal. 60 tablet 1   ondansetron (ZOFRAN) 4 MG tablet Take 2 tablets (59m total) by mouth once every 8 hours as needed for nausea and vomiting. 80 tablet 1   bismuth subsalicylate (PEPTO BISMOL) 262 MG/15ML suspension Take 30 mLs by mouth every 6 (six) hours as needed. (Patient not taking: Reported on 02/28/2022)     Multiple Vitamin (MULTI-VITAMINS) TABS Take 1 tablet by mouth daily. (Patient not taking: Reported on 02/16/2022)     TURMERIC PO Take 1 tablet by mouth daily. (Patient not taking: Reported on 02/16/2022)     No current facility-administered medications for this visit.    VITAL SIGNS: BP 107/86 (BP Location: Right Arm, Patient Position: Sitting)   Pulse (!) 115   Temp 97.8 F (36.6 C) (Tympanic)   Resp (!) 21   SpO2 96%  Filed Weights    Estimated body mass index is 23.48 kg/m as calculated from the following:   Height as of 01/31/22: 5' 1.5" (1.562 m).   Weight as of 01/31/22: 126 lb 4.8 oz (57.3 kg).  LABS: CBC:    Component Value Date/Time   WBC 12.0 (H) 02/22/2022 1347   HGB 14.6 02/12/2022 1347   HGB 15.9 12/01/2013 1440   HCT 41.3 02/25/2022 1347   HCT 46.8 12/01/2013 1440   PLT 95 (L) 02/17/2022 1347   PLT 236 12/01/2013 1440   MCV 92.8 02/18/2022 1347   MCV 87 12/01/2013 1440   NEUTROABS 9.3 (H) 02/08/2022 1347   LYMPHSABS 0.9 02/09/2022 1347   MONOABS 0.3 03/06/2022 1347   EOSABS 0.0 02/24/2022 1347   BASOSABS 0.0 02/27/2022 1347   Comprehensive Metabolic Panel:    Component Value Date/Time   NA 127 (L) 03/03/2022 1347   NA 137 12/01/2013 1440   K 4.6 02/23/2022 1347   K 3.7 12/01/2013 1440    CL 95 (L) 02/14/2022 1347   CL 108 (H) 12/01/2013 1440   CO2 18 (L) 02/12/2022 1347   CO2 25 12/01/2013 1440   BUN 27 (H) 02/08/2022 1347   BUN 8 12/01/2013 1440   CREATININE 0.71 02/19/2022 1347   CREATININE 0.43 (L) 12/01/2013 1440   GLUCOSE 429 (H) 03/02/2022 1347   GLUCOSE 117 (H) 12/01/2013 1440   CALCIUM 9.4 03/06/2022 1347   CALCIUM 8.8 12/01/2013 1440   AST 33 01/31/2022 0850   AST 35 12/01/2013 1440   ALT 96 (H) 01/31/2022 0850   ALT 36 12/01/2013 1440   ALKPHOS 75 01/31/2022 0850   ALKPHOS 327 (H) 12/01/2013 1440   BILITOT 0.9 01/31/2022 0850   BILITOT 0.4 12/01/2013 1440   PROT 6.3 (L) 01/31/2022 0850   PROT 7.8 12/01/2013 1440   ALBUMIN 3.0 (L) 01/31/2022 0850   ALBUMIN 3.7  12/01/2013 1440    RADIOGRAPHIC STUDIES: CT HEAD WO CONTRAST (5MM)  Result Date: 01/18/2022 CLINICAL DATA:  History of metastatic non-small cell lung cancer. EXAM: CT HEAD WITHOUT CONTRAST TECHNIQUE: Contiguous axial images were obtained from the base of the skull through the vertex without intravenous contrast. RADIATION DOSE REDUCTION: This exam was performed according to the departmental dose-optimization program which includes automated exposure control, adjustment of the mA and/or kV according to patient size and/or use of iterative reconstruction technique. COMPARISON:  Head CT and MRI brain 12/31/2021. FINDINGS: Brain: Overall definite interval improvement in the amount of tumoral edema seen in the left parietal white matter. The actual metastatic focus is difficult to identify this study without contrast and is better seen on the MRI. No new areas of edema to suggest other metastatic lesions. No evidence of acute hemispheric infarction or intracranial hemorrhage. The brainstem and cerebellum are grossly and stable. Vascular: Minimal vascular calcifications are stable. No hyperdense vessels. Skull: No skull lesions. Sinuses/Orbits: The paranasal sinuses and mastoid air cells are clear. The globes  are intact. Other: No scalp lesions or scalp hematoma. IMPRESSION: 1. Overall definite interval improvement in the amount of tumoral edema in the left parietal white matter. The actual metastatic focus is difficult to identify this study without contrast and is better seen on the MRI. 2. No new areas of edema to suggest other metastatic lesions. Electronically Signed   By: Marijo Sanes M.D.   On: 01/18/2022 14:30   MR BRAIN W WO CONTRAST  Result Date: 01/20/2022 CLINICAL DATA:  Initial evaluation for brain CNS neoplasm. EXAM: MRI HEAD WITHOUT AND WITH CONTRAST TECHNIQUE: Multiplanar, multiecho pulse sequences of the brain and surrounding structures were obtained without and with intravenous contrast. CONTRAST:  52m GADAVIST GADOBUTROL 1 MMOL/ML IV SOLN COMPARISON:  Prior MRI from 12/31/2021. FINDINGS: Brain: Previously identified intracranial metastatic deposit positioned at the left parietal lobe again seen. Overall, lesion is not significantly changed in overall size measuring 1.7 x 1.7 x 1.9 cm. However, the lesion demonstrates diminished peripheral enhancement as compared to previous exam. Additionally, associated vasogenic edema within the adjacent left parietal region is also improved. Improved mass effect on the left lateral ventricle without significant midline shift. Overall, changes are consistent with interval response to therapy. No new lesions identified. No acute or subacute infarct. Gray-white matter differentiation otherwise maintained. Underlying mild chronic microvascular ischemic disease noted. No acute intracranial hemorrhage. Small focus of chronic hemosiderin staining at the left frontal lobe noted, stable. No other new acute or chronic intracranial blood products. No other mass lesion or mass effect. No hydrocephalus or extra-axial fluid collection. Pituitary gland suprasellar region normal. Midline structures intact and normal. No other abnormal enhancement. Vascular: Major intracranial  vascular flow voids are maintained. Skull and upper cervical spine: Craniocervical junction with normal limits. Bone marrow signal intensity normal. No focal marrow replacing lesion. No scalp soft tissue abnormality. Sinuses/Orbits: Globes and orbital soft tissues within normal limits. Mild scattered mucosal thickening noted about the sphenoid ethmoidal and maxillary sinuses. Small bilateral mastoid effusions noted. Other: None. IMPRESSION: 1. Similar size of left parietal metastatic lesion, but with decreased enhancement and associated vasogenic edema, consistent with interval response to therapy. Secondary improved mass effect within the left parietal region as compared to previous. No new lesions identified. 2. No other new or acute intracranial abnormality. Electronically Signed   By: BJeannine BogaM.D.   On: 01/20/2022 01:50   DG Chest Port 1 View  Result Date: 01/18/2022 CLINICAL  DATA:  Weakness. EXAM: PORTABLE CHEST 1 VIEW COMPARISON:  December 31, 2021. FINDINGS: The heart size and mediastinal contours are within normal limits. Right internal jugular Port-A-Cath is unchanged in position. Left lung is clear. Stable right upper lobe mass is noted. The visualized skeletal structures are unremarkable. IMPRESSION: Stable right upper lobe mass is noted.  Left lung is clear. Electronically Signed   By: Marijo Conception M.D.   On: 01/18/2022 11:35    PERFORMANCE STATUS (ECOG) : 3 - Symptomatic, >50% confined to bed  Review of Systems Unless otherwise noted, a complete review of systems is negative.  Physical Exam General: Grey, Mildly diaphoretic, answers questions appropriately  Cardiovascular: regular rate and rhythm Pulmonary: clear ant fields Abdomen: + bowel sounds, moderately distended, very tender to palpation throughout even to light touch Extremities: Mild edema of the left leg, no right leg edema, Pt is able to wiggles her toes bilaterally but is not able to raise her legs  Skin: no  rashes Neurological: Weakness but otherwise nonfocal  Assessment and Plan- Patient is a 61 y.o. female  Encounter Diagnoses  Name Primary?   Weakness Yes   Generalized abdominal pain    Lung cancer metastatic to brain [facility])    Confusion    New. Very concerning for disease progression vs other. Labs worsening- reviewed with patient. Daughter not able to manage her care at home. ER recommended.    Patient expressed understanding and was in agreement with this plan. She also understands that She can call clinic at any time with any questions, concerns, or complaints.   Thank you for allowing me to participate in the care of this very pleasant patient.   Time Total: 40  Visit consisted of counseling and education dealing with the complex and emotionally intense issues of symptom management in the setting of serious illness.Greater than 50%  of this time was spent counseling and coordinating care related to the above assessment and plan.  Signed by: Nelwyn Salisbury, PA-C

## 2022-02-07 NOTE — ED Notes (Signed)
Unable to get oral temp. Primary RN to check rectal temp

## 2022-02-07 NOTE — ED Provider Notes (Signed)
[facility] Provider Note    Event Date/Time   First MD Initiated Contact with Patient 02/09/2022 1558     (approximate)   History   Abdominal Pain and Weakness   HPI  Nichole Park is a 61 y.o. female  who, per oncology note dated today has history of lung adenocarcinoma who was sent from oncology clinic because of concern for "disease progression vs other." The patient has myriad complaints.  The patient had a hospitalization with discharge roughly few weeks ago.  Since that time family has been trying to take care of her at home but has noticed increased weakness.  She is having a hard time even using her legs at this point.  Additionally the patient has had decreased appetite.  Over the past few days she has developed lower abdominal pain.  Patient has also had some confusion.  She states that it is hard for her to comprehend what is going on around her.  The patient has not been noticed to have any fevers by her family although she has had sweating.  Family thinks that she has had some bad odor to her urine.    Physical Exam   Triage Vital Signs: ED Triage Vitals  Enc Vitals Group     BP 03/01/2022 1533 (!) 87/50     Pulse Rate 02/13/2022 1533 (!) 116     Resp 02/21/2022 1533 (!) 40     Temp 02/26/2022 1555 98.8 F (37.1 C)     Temp Source 03/02/2022 1533 Axillary     SpO2 02/26/2022 1533 91 %     Weight 02/18/2022 1536 130 lb (59 kg)     Height 02/25/2022 1536 5' 1" (1.549 m)     Head Circumference --      Peak Flow --      Pain Score 02/12/2022 1536 10   Most recent vital signs: Vitals:   02/17/2022 1545 02/14/2022 1555  BP: (!) 139/95   Pulse: (!) 107   Resp: (!) 35   Temp:  98.8 F (37.1 C)  SpO2: 93%    General: Awake, alert, oriented. CV:  Good peripheral perfusion. Tachycardia. Regular rhythm. Resp:  Increased work of breathing. Abd:  No distention. Tender to palpation in the lower abdomen.   ED Results / Procedures / Treatments   Labs (all labs  ordered are listed, but only abnormal results are displayed) Labs Reviewed  LACTIC ACID, PLASMA - Abnormal; Notable for the following components:      Result Value   Lactic Acid, Venous 6.2 (*)    All other components within normal limits  LACTIC ACID, PLASMA - Abnormal; Notable for the following components:   Lactic Acid, Venous 3.5 (*)    All other components within normal limits  COMPREHENSIVE METABOLIC PANEL - Abnormal; Notable for the following components:   Sodium 132 (*)    CO2 17 (*)    Glucose, Bld 441 (*)    BUN 31 (*)    Total Protein 6.0 (*)    Albumin 2.1 (*)    AST 56 (*)    ALT 61 (*)    Anion gap 17 (*)    All other components within normal limits  CBC WITH DIFFERENTIAL/PLATELET - Abnormal; Notable for the following components:   Platelets 94 (*)    nRBC 1.9 (*)    Abs Immature Granulocytes 0.92 (*)    All other components within normal limits  SARS CORONAVIRUS 2 BY RT PCR  CULTURE, BLOOD (ROUTINE X 2)  CULTURE, BLOOD (ROUTINE X 2)  URINALYSIS, COMPLETE (UACMP) WITH MICROSCOPIC  TYPE AND SCREEN     EKG  I, Nance Pear, attending physician, personally viewed and interpreted this EKG  EKG Time: 1545 Rate: 109 Rhythm: sinus tachycardia Axis: normal Intervals: qtc 460 QRS: narrow ST changes: no st elevation Impression: left atrial enlargement, otherwise normal ekg  RADIOLOGY I independently interpreted and visualized the CXR. My interpretation: Masses. No pneumothorax. Radiology interpretation:  IMPRESSION:  Stable known right upper lobe mass.     Note is made on recent chest CT of multiple new pulmonary nodules  concerning for pulmonary metastatic disease.     New large left perihilar density compared to recent 01/18/2022  radiographs only 20 days ago. Given the acute rapid change, this may  represent pneumonia however remains concerning for metastatic  disease.    I independently interpreted and visualized the CT abd/pel. My  interpretation: Free air. Radiology interpretation:    IMPRESSION:  1. Acute perforated sigmoid diverticulitis as above. Diffuse  pneumoperitoneum, with small gas/fluid collection at the site of  diverticulitis. No rim enhancement to suggest abscess at this time.  2. Left lower lobe consolidation, favor pneumonia. Please see  previous chest x-ray discussion.  3. Stable findings of a static lung cancer, with mediastinal  adenopathy and right lower lobe nodularity unchanged. Please see  recent chest CT 12/31/2021 full description.  4. Hepatic steatosis.  5.  Aortic Atherosclerosis (ICD10-I70.0).   I, Nance Pear, personally discussed these images (CT abd/pel) and results by phone with the on-call radiologist and used this discussion as part of my medical decision making.    I independently interpreted and visualized the CT head. My interpretation: No bleed Radiology interpretation:  IMPRESSION:  LEFT parietal white matter vasogenic edema due to underlying known  metastatic lesion as demonstrated by prior MR.     No new intracranial abnormalities.     PROCEDURES:  Critical Care performed: Yes, see critical care procedure note(s)  Procedures  CRITICAL CARE Performed by: Nance Pear   Total critical care time: 40 minutes  Critical care time was exclusive of separately billable procedures and treating other patients.  Critical care was necessary to treat or prevent imminent or life-threatening deterioration.  Critical care was time spent personally by me on the following activities: development of treatment plan with patient and/or surrogate as well as nursing, discussions with consultants, evaluation of patient's response to treatment, examination of patient, obtaining history from patient or surrogate, ordering and performing treatments and interventions, ordering and review of laboratory studies, ordering and review of radiographic studies, pulse oximetry and  re-evaluation of patient's condition.   MEDICATIONS ORDERED IN ED: Medications - No data to display   IMPRESSION / MDM / [place] / ED COURSE  I reviewed the triage vital signs and the nursing notes.                              Differential diagnosis includes, but is not limited to, infection, diverticulitis, anemia, electrolyte abnormality.  Patient presented to the emergency department today from oncology clinic.  On exam patient was notably tender in the abdomen.  Patient also appeared weak.  Blood work and imaging studies were ordered.  Blood work was notable for elevated lactic acid level.  Broad-spectrum antibiotics were started.  CT head showed no vasogenic edema secondary to mass.  I did obtain a CT  scan which was concerning for perforated diverticulitis.  I discussed this finding with the patient.  I discussed with Dr. Lysle Pearl with surgery who came and evaluated the patient.  Dr. Lysle Pearl will take the patient emergently to the operating room.  Patient's presentation is most consistent with acute presentation with potential threat to life or bodily function.     FINAL CLINICAL IMPRESSION(S) / ED DIAGNOSES   Final diagnoses:  Weakness  Perforated diverticulum of large intestine       Note:  This document was prepared using Dragon voice recognition software and may include unintentional dictation errors.    Nance Pear, MD 02/14/2022 [phone]

## 2022-02-07 NOTE — Op Note (Signed)
[facility] PROCEDURE DATE: 03/06/2022  INDICATIONS: Adnexal bleeding  PROCEDURE:  - Exam under anesthesia - Open left salpingectomy  SURGEON:  Dr. Benjaman Kindler ASSISTANT: Dr. Inocente Salles Sakei ANESTHESIOLOGIST: Anesthesiologist: Arita Miss, MD CRNA: Aline Brochure, CRNA  INDICATIONS: 61 y.o. F with history of suspected ruptured diverticulitis and metastatic lung adenocarcinoma. I was called to the OR by general surgery for bleeding in the left adnexa   FINDINGS:  Abdomen open with a self-retaining retractor, s/p colectomy. Left adnexa edematous with bleeding noted from the fimbria and mesosalpinx.  ANESTHESIA:  General INTRAVENOUS FLUIDS: see anesthesia ESTIMATED BLOOD LOSS: for my part of the procedure, minimal URINE OUTPUT: see anesthesia SPECIMENS: left salpingectomy COMPLICATIONS: None immediate  PROCEDURE IN DETAIL:  The patient was already asleep at the beginning of the procedure.  After packing and evaluation, minimal bleeding was noted from the adnexa.  The ovary was intact, and the IP was intact.  The fimbria of the edematous fallopian tube was bleeding, and the decision was made to remove the tube to control bleeding in the setting of malignancy and thrombocytopenia.  Examination of the ureter found it to be well posterior and of the surgical plan.  The ovary was left intact.  Using the LigaSure impact, the mesosalpinx was cauterized and cut.  The surgical field was noted to be hemostatic, and evaluation of the uterus noted intact round ligament.  The cornua was minimally bleeding, and this was controlled with cautery.  Control of the surgery was turned back over to Dr. Lysle Pearl; please see his report for the full case. She was hemodynamically stable when I exited the room.

## 2022-02-07 NOTE — Anesthesia Procedure Notes (Signed)
Procedure Name: Intubation Date/Time: 02/23/2022 7:35 PM Performed by: Aline Brochure, CRNA Pre-anesthesia Checklist: Patient identified, Patient being monitored, Timeout performed, Emergency Drugs available and Suction available Patient Re-evaluated:Patient Re-evaluated prior to induction Oxygen Delivery Method: Circle system utilized Preoxygenation: Pre-oxygenation with 100% oxygen Induction Type: IV induction Ventilation: Mask ventilation without difficulty Laryngoscope Size: 3 and McGraph Grade View: Grade I Tube type: Oral Tube size: 7.5 mm Number of attempts: 1 Airway Equipment and Method: Stylet and Video-laryngoscopy Placement Confirmation: ETT inserted through vocal cords under direct vision, positive ETCO2 and breath sounds checked- equal and bilateral Secured at: 21 cm Tube secured with: Tape Dental Injury: Teeth and Oropharynx as per pre-operative assessment

## 2022-02-08 ENCOUNTER — Inpatient Hospital Stay: Payer: Self-pay

## 2022-02-08 ENCOUNTER — Inpatient Hospital Stay: Payer: Medicaid Other

## 2022-02-08 ENCOUNTER — Encounter: Payer: Self-pay | Admitting: Surgery

## 2022-02-08 DIAGNOSIS — Z515 Encounter for palliative care: Secondary | ICD-10-CM

## 2022-02-08 DIAGNOSIS — R6521 Severe sepsis with septic shock: Secondary | ICD-10-CM | POA: Diagnosis not present

## 2022-02-08 DIAGNOSIS — A419 Sepsis, unspecified organism: Secondary | ICD-10-CM | POA: Diagnosis not present

## 2022-02-08 LAB — BLOOD GAS, VENOUS
Acid-Base Excess: 2.8 mmol/L — ABNORMAL HIGH (ref 0.0–2.0)
Bicarbonate: 27.2 mmol/L (ref 20.0–28.0)
O2 Saturation: 76.2 %
Patient temperature: 37
pCO2, Ven: 40 mmHg — ABNORMAL LOW (ref 44–60)
pH, Ven: 7.44 — ABNORMAL HIGH (ref 7.25–7.43)
pO2, Ven: 44 mmHg (ref 32–45)

## 2022-02-08 LAB — BLOOD CULTURE ID PANEL (REFLEXED) - BCID2

## 2022-02-08 LAB — BASIC METABOLIC PANEL
Anion gap: 4 — ABNORMAL LOW (ref 5–15)
Anion gap: 10 (ref 5–15)
BUN: 24 mg/dL — ABNORMAL HIGH (ref 6–20)
BUN: 21 mg/dL — ABNORMAL HIGH (ref 6–20)
CO2: 26 mmol/L (ref 22–32)
CO2: 26 mmol/L (ref 22–32)
Calcium: 7.6 mg/dL — ABNORMAL LOW (ref 8.9–10.3)
Calcium: 8 mg/dL — ABNORMAL LOW (ref 8.9–10.3)
Chloride: 108 mmol/L (ref 98–111)
Chloride: 103 mmol/L (ref 98–111)
Creatinine, Ser: 0.43 mg/dL — ABNORMAL LOW (ref 0.44–1.00)
Creatinine, Ser: 0.38 mg/dL — ABNORMAL LOW (ref 0.44–1.00)
GFR, Estimated: >60 mL/min (ref >60)
GFR, Estimated: >60 mL/min (ref >60)
Glucose, Bld: 206 mg/dL — ABNORMAL HIGH (ref 70–99)
Glucose, Bld: 162 mg/dL — ABNORMAL HIGH (ref 70–99)
Potassium: 3 mmol/L — ABNORMAL LOW (ref 3.5–5.1)
Potassium: 3.5 mmol/L (ref 3.5–5.1)
Sodium: 138 mmol/L (ref 135–145)
Sodium: 139 mmol/L (ref 135–145)

## 2022-02-08 LAB — LACTIC ACID, PLASMA
Lactic Acid, Venous: 2.3 mmol/L (ref 0.5–1.9)
Lactic Acid, Venous: 2.9 mmol/L (ref 0.5–1.9)
Lactic Acid, Venous: 4.6 mmol/L (ref 0.5–1.9)
Lactic Acid, Venous: 6.8 mmol/L (ref 0.5–1.9)

## 2022-02-08 LAB — HEPATIC FUNCTION PANEL
ALT: 40 U/L (ref 0–44)
AST: 40 U/L (ref 15–41)
Albumin: 1.9 g/dL — ABNORMAL LOW (ref 3.5–5.0)
Alkaline Phosphatase: 50 U/L (ref 38–126)
Bilirubin, Direct: 0.2 mg/dL (ref 0.0–0.2)
Indirect Bilirubin: 0.6 mg/dL (ref 0.3–0.9)
Total Bilirubin: 0.8 mg/dL (ref 0.3–1.2)
Total Protein: 4 g/dL — ABNORMAL LOW (ref 6.5–8.1)

## 2022-02-08 LAB — COMPREHENSIVE METABOLIC PANEL
ALT: 47 U/L — ABNORMAL HIGH (ref 0–44)
AST: 55 U/L — ABNORMAL HIGH (ref 15–41)
Albumin: <1.5 g/dL — ABNORMAL LOW (ref 3.5–5.0)
Alkaline Phosphatase: 78 U/L (ref 38–126)
Anion gap: 9 (ref 5–15)
BUN: 26 mg/dL — ABNORMAL HIGH (ref 6–20)
CO2: 16 mmol/L — ABNORMAL LOW (ref 22–32)
Calcium: 8 mg/dL — ABNORMAL LOW (ref 8.9–10.3)
Chloride: 109 mmol/L (ref 98–111)
Creatinine, Ser: 0.61 mg/dL (ref 0.44–1.00)
GFR, Estimated: >60 mL/min (ref >60)
Glucose, Bld: 192 mg/dL — ABNORMAL HIGH (ref 70–99)
Potassium: 3.7 mmol/L (ref 3.5–5.1)
Sodium: 134 mmol/L — ABNORMAL LOW (ref 135–145)
Total Bilirubin: 0.7 mg/dL (ref 0.3–1.2)
Total Protein: 4.3 g/dL — ABNORMAL LOW (ref 6.5–8.1)

## 2022-02-08 LAB — PROCALCITONIN
Procalcitonin: 2.74 ng/mL
Procalcitonin: 3.99 ng/mL

## 2022-02-08 LAB — GLUCOSE, CAPILLARY
Glucose-Capillary: 190 mg/dL — ABNORMAL HIGH (ref 70–99)
Glucose-Capillary: 153 mg/dL — ABNORMAL HIGH (ref 70–99)
Glucose-Capillary: 111 mg/dL — ABNORMAL HIGH (ref 70–99)
Glucose-Capillary: 76 mg/dL (ref 70–99)
Glucose-Capillary: 63 mg/dL — ABNORMAL LOW (ref 70–99)
Glucose-Capillary: 152 mg/dL — ABNORMAL HIGH (ref 70–99)
Glucose-Capillary: 101 mg/dL — ABNORMAL HIGH (ref 70–99)

## 2022-02-08 LAB — CBC
HCT: 34.2 % — ABNORMAL LOW (ref 36.0–46.0)
Hemoglobin: 11.6 g/dL — ABNORMAL LOW (ref 12.0–15.0)
MCH: 32 pg (ref 26.0–34.0)
MCHC: 33.9 g/dL (ref 30.0–36.0)
MCV: 94.5 fL (ref 80.0–100.0)
Platelets: 74 10*3/uL — ABNORMAL LOW (ref 150–400)
RBC: 3.62 MIL/uL — ABNORMAL LOW (ref 3.87–5.11)
RDW: 13.4 % (ref 11.5–15.5)
WBC: 9.2 10*3/uL (ref 4.0–10.5)
nRBC: 1.6 % — ABNORMAL HIGH (ref 0.0–0.2)

## 2022-02-08 LAB — MRSA NEXT GEN BY PCR, NASAL: MRSA by PCR Next Gen: NOT DETECTED

## 2022-02-08 LAB — PHOSPHORUS: Phosphorus: 2.8 mg/dL (ref 2.5–4.6)

## 2022-02-08 LAB — MAGNESIUM
Magnesium: 1.4 mg/dL — ABNORMAL LOW (ref 1.7–2.4)
Magnesium: 2.7 mg/dL — ABNORMAL HIGH (ref 1.7–2.4)

## 2022-02-08 MED ORDER — METHYLPREDNISOLONE SODIUM SUCC 40 MG IJ SOLR
20.0000 mg | Freq: Two times a day (BID) | INTRAMUSCULAR | Status: DC
  Administered 2022-02-08 – 2022-02-11 (×6): 20 mg via INTRAVENOUS
  Filled 2022-02-08 (×6): qty 1

## 2022-02-08 MED ORDER — LACTATED RINGERS IV BOLUS
500.0000 mL | Freq: Once | INTRAVENOUS | Status: AC
  Administered 2022-02-08: 500 mL via INTRAVENOUS

## 2022-02-08 MED ORDER — MAGNESIUM SULFATE 4 GM/100ML IV SOLN
4.0000 g | Freq: Once | INTRAVENOUS | Status: AC
Start: 2022-02-08 — End: 2022-02-08
  Administered 2022-02-08: 4 g via INTRAVENOUS
  Filled 2022-02-08: qty 100

## 2022-02-08 MED ORDER — INSULIN ASPART 100 UNIT/ML IJ SOLN
0.0000 [IU] | INTRAMUSCULAR | Status: DC
  Administered 2022-02-08: 2 [IU] via SUBCUTANEOUS
  Filled 2022-02-08: qty 1

## 2022-02-08 MED ORDER — VANCOMYCIN HCL 500 MG/100ML IV SOLN
500.0000 mg | Freq: Once | INTRAVENOUS | Status: AC
  Administered 2022-02-08: 500 mg via INTRAVENOUS
  Filled 2022-02-08: qty 100

## 2022-02-08 MED ORDER — PIPERACILLIN-TAZOBACTAM 3.375 G IVPB
3.3750 g | Freq: Three times a day (TID) | INTRAVENOUS | Status: DC
  Administered 2022-02-09 – 2022-02-10 (×6): 3.375 g via INTRAVENOUS
  Filled 2022-02-08 (×6): qty 50

## 2022-02-08 MED ORDER — ALBUMIN HUMAN 25 % IV SOLN
25.0000 g | Freq: Four times a day (QID) | INTRAVENOUS | Status: AC
  Administered 2022-02-08 – 2022-02-09 (×2): 25 g via INTRAVENOUS
  Filled 2022-02-08 (×2): qty 100

## 2022-02-08 MED ORDER — PHENYLEPHRINE CONCENTRATED 100MG/250ML (0.4 MG/ML) INFUSION SIMPLE: 0.0000 ug/min | INTRAVENOUS | Status: DC

## 2022-02-08 MED ORDER — INSULIN ASPART 100 UNIT/ML IJ SOLN
0.0000 [IU] | INTRAMUSCULAR | Status: DC
  Administered 2022-02-08: 3 [IU] via SUBCUTANEOUS
  Administered 2022-02-09 – 2022-02-10 (×3): 2 [IU] via SUBCUTANEOUS
  Administered 2022-02-10: 8 [IU] via SUBCUTANEOUS
  Administered 2022-02-10: 3 [IU] via SUBCUTANEOUS
  Filled 2022-02-08 (×7): qty 1

## 2022-02-08 MED ORDER — SODIUM CHLORIDE 0.9 % IV SOLN
2.0000 g | INTRAVENOUS | Status: DC
  Administered 2022-02-08: 2 g via INTRAVENOUS
  Filled 2022-02-08: qty 2

## 2022-02-08 MED ORDER — STERILE WATER FOR INJECTION IV SOLN
INTRAVENOUS | Status: DC
  Filled 2022-02-08 (×2): qty 1000
  Filled 2022-02-08: qty 150
  Filled 2022-02-08: qty 1000

## 2022-02-08 MED ORDER — POTASSIUM CHLORIDE 10 MEQ/100ML IV SOLN
10.0000 meq | INTRAVENOUS | Status: AC
  Administered 2022-02-08 (×4): 10 meq via INTRAVENOUS
  Filled 2022-02-08 (×4): qty 100

## 2022-02-08 MED ORDER — SODIUM CHLORIDE 0.9% FLUSH
10.0000 mL | Freq: Two times a day (BID) | INTRAVENOUS | Status: DC
  Administered 2022-02-08 – 2022-02-10 (×5): 10 mL

## 2022-02-08 MED ORDER — SODIUM CHLORIDE 0.9 % IV SOLN
2.0000 g | Freq: Three times a day (TID) | INTRAVENOUS | Status: DC
  Administered 2022-02-08: 2 g via INTRAVENOUS
  Filled 2022-02-08 (×2): qty 12.5

## 2022-02-08 MED ORDER — PHENYLEPHRINE CONCENTRATED 100MG/250ML (0.4 MG/ML) INFUSION SIMPLE
25.0000 ug/min | INTRAVENOUS | Status: DC
  Administered 2022-02-08: 150 ug/min via INTRAVENOUS
  Filled 2022-02-08: qty 250

## 2022-02-08 MED ORDER — ORAL CARE MOUTH RINSE
15.0000 mL | Freq: Two times a day (BID) | OROMUCOSAL | Status: DC
  Administered 2022-02-08 – 2022-02-10 (×5): 15 mL via OROMUCOSAL

## 2022-02-08 MED ORDER — SODIUM CHLORIDE 0.9% FLUSH: 10.0000 mL | INTRAVENOUS | Status: DC | PRN

## 2022-02-08 MED ORDER — ACETAMINOPHEN 10 MG/ML IV SOLN
1000.0000 mg | Freq: Four times a day (QID) | INTRAVENOUS | Status: AC
Start: 2022-02-08 — End: 2022-02-08
  Administered 2022-02-08 (×2): 1000 mg via INTRAVENOUS
  Filled 2022-02-08 (×2): qty 100

## 2022-02-08 MED ORDER — DEXTROSE 50 % IV SOLN
INTRAVENOUS | Status: AC
  Filled 2022-02-08: qty 50

## 2022-02-08 MED ORDER — ALBUMIN HUMAN 25 % IV SOLN
12.5000 g | Freq: Four times a day (QID) | INTRAVENOUS | Status: DC
  Administered 2022-02-08 (×2): 12.5 g via INTRAVENOUS
  Filled 2022-02-08 (×2): qty 50

## 2022-02-08 MED ORDER — VANCOMYCIN HCL 1250 MG/250ML IV SOLN: 1250.0000 mg | INTRAVENOUS | Status: DC

## 2022-02-08 MED ORDER — ACETAMINOPHEN 10 MG/ML IV SOLN
1000.0000 mg | Freq: Four times a day (QID) | INTRAVENOUS | Status: DC
Start: 2022-02-08 — End: 2022-02-08
  Administered 2022-02-08: 1000 mg via INTRAVENOUS
  Filled 2022-02-08: qty 100

## 2022-02-08 MED ORDER — ALBUMIN HUMAN 25 % IV SOLN
12.5000 g | Freq: Once | INTRAVENOUS | Status: AC
  Administered 2022-02-08: 12.5 g via INTRAVENOUS
  Filled 2022-02-08: qty 50

## 2022-02-08 MED ORDER — PHENYLEPHRINE CONCENTRATED 100MG/250ML (0.4 MG/ML) INFUSION SIMPLE
0.0000 ug/min | INTRAVENOUS | Status: DC
  Administered 2022-02-08: 150 ug/min via INTRAVENOUS
  Administered 2022-02-08: 200 ug/min via INTRAVENOUS
  Administered 2022-02-09: 25 ug/min via INTRAVENOUS
  Filled 2022-02-08 (×2): qty 250

## 2022-02-08 MED ORDER — DEXTROSE 50 % IV SOLN
12.5000 g | INTRAVENOUS | Status: AC
  Administered 2022-02-08: 12.5 g via INTRAVENOUS

## 2022-02-08 MED ORDER — CHLORHEXIDINE GLUCONATE CLOTH 2 % EX PADS
6.0000 | MEDICATED_PAD | Freq: Every day | CUTANEOUS | Status: DC
  Administered 2022-02-08 – 2022-02-10 (×3): 6 via TOPICAL

## 2022-02-08 NOTE — Progress Notes (Signed)
PHARMACY CONSULT NOTE  Pharmacy Consult for Electrolyte Monitoring and Replacement   Recent Labs: Potassium (mmol/L)  Date Value  02/08/2022 3.5  12/01/2013 3.7   Magnesium (mg/dL)  Date Value  02/08/2022 2.7 (H)   Calcium (mg/dL)  Date Value  02/08/2022 8.0 (L)   Calcium, Total (mg/dL)  Date Value  12/01/2013 8.8   Albumin (g/dL)  Date Value  02/08/2022 1.9 (L)  12/01/2013 3.7   Phosphorus (mg/dL)  Date Value  02/08/2022 2.8   Sodium (mmol/L)  Date Value  02/08/2022 139  12/01/2013 137     Assessment:  female w/ PMH of metastatic lung ca on chemo with mets to brain, COPD, who presented with DKA and septic shock secondary perforated sigmoid diverticulitis with diffuse pneumoperitoneum and LLL pneumonia. Pharmacy is asked to follow and replace electrolytes while in CCU  Goal of Therapy:  Electrolytes WNL  Plan:  4 grams IV magnesium sulfate x 1 per NP 10 mEq IV KCl x 4 per NP Recheck electrolytes at completion of above replacements  Dallie Piles ,PharmD Clinical Pharmacist 02/08/2022 12:44 PM

## 2022-02-08 NOTE — Progress Notes (Signed)
Initial Nutrition Assessment  DOCUMENTATION CODES:   Not applicable  INTERVENTION:   -RD will follow for diet advancement and add supplements as appropriate -If prolonged NPO status is anticipated and within pt's goals of care, consider initiation of TPN  NUTRITION DIAGNOSIS:   Increased nutrient needs related to post-op healing as evidenced by estimated needs.  GOAL:   Patient will meet greater than or equal to 90% of their needs  MONITOR:   Diet advancement  REASON FOR ASSESSMENT:   Malnutrition Screening Tool    ASSESSMENT:   Pt with PMH of Stage IV Adenocarcinoma. Presented with worsening weakness, bilateral lower extremity weakness, abdominal pain, and confusion.  Pt admitted with perforated diverticulitis.   6/1- s/p Exploratory laparatomy, lysis of adhesions, abdominal washout, splenic flexure takedown and extended left colon resection with colostomy (Hartmann's procedure); s/p Open left salpingectomy  Reviewed I/O's: +4.8 L x 24 hours  UOP: 315 ml x 24 hours  JP drain output: 140 ml x 24 hours   Pt with MD at time of visit. RD unable to obtain further nutrition-related history or complete nutrition-focused physical exam at this time.    Case discussed with MD, RN, and during ICU rounds. Pt with history of lung cancer with mets to the brain. Pt NPO for bowel rest and NGT for decompression. Pt with poor prognosis and oncology and palliative care have been consulted for goals of care. Per MD, plan to place PICC today and will consider initiating TPN pending goals of care discussions.   Reviewed wt hx; pt has experienced a 8.2% wt loss over the past 3 months, which is significant for time frame.   Medications reviewed and include solu-medrol.   Labs reviewed: CBGS: 76-153 (inpatient orders for glycemic control are 0-15 units insulin aspart every 4 hours).    Diet Order:   Diet Order             Diet NPO time specified Except for: Ice Chips, Sips with Meds   Diet effective now                   EDUCATION NEEDS:   No education needs have been identified at this time  Skin:  Skin Assessment: Skin Integrity Issues: Skin Integrity Issues:: Incisions Incisions: closed abdomen  Last BM:  Unknown (new colostomy)  Height:   Ht Readings from Last 1 Encounters:  03/06/2022 5\' 1"  (1.549 m)    Weight:   Wt Readings from Last 1 Encounters:  02/23/2022 59.1 kg    Ideal Body Weight:  47.7 kg  BMI:  Body mass index is 24.62 kg/m.  Estimated Nutritional Needs:   Kcal:  1850-2050  Protein:  100-115 grams  Fluid:  > 1.8 L    Loistine Chance, RD, LDN, [place] Registered Dietitian II Certified Diabetes Care and Education Specialist Please refer to [facility] for RD and/or RD on-call/weekend/after hours pager

## 2022-02-08 NOTE — Progress Notes (Signed)
Peripherally Inserted Central Catheter Placement  The IV Nurse has discussed with the patient and/or persons authorized to consent for the patient, the purpose of this procedure and the potential benefits and risks involved with this procedure.  The benefits include less needle sticks, lab draws from the catheter, and the patient may be discharged home with the catheter. Risks include, but not limited to, infection, bleeding, blood clot (thrombus formation), and puncture of an artery; nerve damage and irregular heartbeat and possibility to perform a PICC exchange if needed/ordered by physician.  Alternatives to this procedure were also discussed.  Bard Power PICC patient education guide, fact sheet on infection prevention and patient information card has been provided to patient /or left at bedside.  Consent obtained from daughter due to less than 24 hours post anesthesia.  PICC inserted by Claretha Cooper, RN   PICC Placement Documentation  PICC Triple Lumen 72/82/06 Left Basilic 40 cm 0 cm (Active)  Indication for Insertion or Continuance of Line Limited venous access - need for IV therapy >5 days (PICC only);Poor Vasculature-patient has had multiple peripheral attempts or PIVs lasting less than 24 hours 02/08/22 1144  Exposed Catheter (cm) 0 cm 02/08/22 1144  Site Assessment Clean;Dry;Intact 02/08/22 1144  Lumen #1 Status Flushed;Saline locked;Blood return noted 02/08/22 1144  Lumen #2 Status Flushed;Saline locked;Blood return noted 02/08/22 1144  Lumen #3 Status Flushed;Saline locked;Blood return noted 02/08/22 1144  Dressing Type Transparent;Securing device 02/08/22 1144  Dressing Status Antimicrobial disc in place 02/08/22 [place] Not Applicable 01/56/15 3794  Line Care Connections checked and tightened 02/08/22 1144  Line Adjustment (NICU/IV Team Only) No 02/08/22 1144  Dressing Intervention New dressing 02/08/22 1144  Dressing Change Due 02/15/22 02/08/22 [place],  Nicolette Bang 02/08/2022, 11:45 AM

## 2022-02-08 NOTE — Progress Notes (Signed)
PHARMACY - PHYSICIAN COMMUNICATION CRITICAL VALUE ALERT - BLOOD CULTURE IDENTIFICATION (BCID)  BCID results:  3 of 4 bottles with E. Coli, no resistance detected.  Pt currently on Cefepime, Vancomycin, & Flagyl.    Name of provider contacted: Rodman Comp, NP  Changes to prescribed antibiotics required: Transition Cefepime to Ceftriaxone, but continue both Vanc and Flagyl for now.  Renda Rolls, PharmD, [facility] 02/08/2022 5:34 AM

## 2022-02-08 NOTE — Op Note (Addendum)
Preoperative diagnosis: perforated diverticulitis  Postoperative diagnosis: same  Procedure: Exploratory laparatomy, lysis of adhesions, abdominal washout, splenic flexure takedown and extended left colon resection with colostomy (Hartmann's procedure).   Anesthesia: GETA  Surgeon: Benjamine Sprague, DO  Wound Classification: Dirty  Specimen: sigmoid colon  Complications: None apparent  EBL: 100 mL  Indications:  Patient is a 61 y.o. female with above.  Please see H&P for further details, including discussion of poor prognosis  Description of procedure:  The patient was placed in the supine position and general endotracheal anesthesia was induced. A time-out was completed verifying correct patient, procedure, site, positioning, and implant(s) and/or special equipment prior to beginning this procedure. Preoperative antibiotics were continued from floor. The abdomen was prepped and draped in the usual sterile fashion. A vertical midline incision was made from periumbilical to pubic bone. This was deepened through the subcutaneous tissues and hemostasis was achieved with electrocautery. The linea alba was identified and incised and the peritoneal cavity entered.  Wound protector placed and the abdomen was explored. Thick peritoneum as noted and when gently retracted, copious amounts of purulent/feculant drainage noted outside bowel. Infection present in abdominal cavity due to the contamination.  All visible drainage suctioned.    Loops of small bowel gently separated from this area and a obviously diseased colon with a perforation was visualized.  The thickened colon was palpated and no obvious masses or other pathology noted, but very difficult to completely assess due to the extremely thickened wall.  The sigmoid colon was traced down to more softer and normal-appearing rectum.  Additional adhesions were encountered to the adjacent abdominal wall as well as the left fimbria and mesosalpinx.  Proximal  colon was traced back to more softer and normal looking ascending colon up and around the splenic flexure.  The splenic flexure was dissected along the lateral attachments in order to obtain better visualization of this area.   Small bowel retracted to the right using a moist towel and self-retaining retractor. Using electrocautery, the colon was freed from its peritoneal attachments along the line of Toldt proximally from the splenic flexure and distally to the pelvic inlet.  Points of transection were selected proximally and distally at healthy tissue. The bowel was divided with the blue cartridge linear cutting stapler at the proximal end and contoured green stapler at the distal end. Ligasure used to divide the mesentery close to the colon.  Rectal stump tagged with 2-0 vicryl. The specimen was removed and sent to pathology. The abdominal cavity was then copiously irrigated until clear lfluid return noted and hemostasis was checked.  Left fimbria and needs a salpinx was noted to have continued oozing and significant irritation.  OB/GYN consultation requested for additional recommendations.  Please see op note from Dr. Leafy Ro for further details regarding her portion of the exam.   Mesentery at the medial aspect was additionally ligated to allow the proximal colon to easily reach the proposed colostomy site on left abdominal wall without tension. A disk of skin was removed from the colostomy site and the incision was deepened through all layers of the abdominal wall and dilated to admit two fingers. The colon was passed out through the ostomy site without torsion or tension.     40 French round drain was placed in the pelvis and left lateral abdominal wall, placed through a right lower quadrant site and secured to skin using 3-0 nylon.  Wound protector was removed and the midline incision fascia was closed with running suture  of PDS 0 x2. The skin was closed with skin staples.  Colon through the ostomy  was inspected and noted to have brisk bleeding when the staple line was removed in preparation for colostomy creation.  The colostomy was matured with multiple interrupted sutures of 3-0 Vicryl.  Because then the end was swollen but seem viable.  Additional bleeding around the area was controlled with electrocautery.  Honeycomb dressing applied to midline dressing and an ostomy bag was applied over colostomy.  Drain sponge dressing placed around the drain. The patient tolerated the procedure well, extubated, and transferred to PACU in stable condition.  NG tube and Foley remain in place.    Sponge count and instrument count all correct at end of procedure.

## 2022-02-08 NOTE — Consult Note (Signed)
NAME:  Nichole Park, MRN:  [id number], DOB:  1960/12/29, LOS: 1 ADMISSION DATE:  03/05/2022, CONSULTATION DATE: 02/19/2022 REFERRING MD: Dr. Lysle Pearl  CHIEF COMPLAINT:  Hypotension   History of Present Illness:  This is a 61 yo female with a PMH of Stage IV Adenocarcinoma (most recent oncology outpatient progress note on 01/31/22 concern of possible progression of disease despite alimta keytruda maintenance therapy.  Therefore, maintenance therapy discontinued and discussed subsequent line therapy with understanding treatment would be palliative not curative.    She presented to [facility] ER on 06/1 from oncology clinic with a 3 day hx of worsening weakness, bilateral lower extremity weakness, abdominal pain, and confusion.  She also endorsed worsening shortness of breath.  Lab results revealed K+ 127, chloride 95, CO2 18, glucose 429, BUN 27, LDH 311, lactic acid 6.2, wbc 12.0, platelets 95, and fibrinogen 705.  CT Abd/Pelvis revealed perforated sigmoid diverticulitis with diffuse pneumoperitoneum with small gas/fluid collection at the site of diverticulitis and LLL pneumonia.  ER physician consulted General Surgeon Dr. Lysle Pearl and pt transported to OR for exploratory laparotomy/Hartman's procedure.  Pt also started on insulin gtt due to DKA.  During exploratory laparotomy, hartmans procedure pt had bleeding in the left adnexa requiring open left salpingectomy performed by OBGYN Dr. Leafy Ro.  Postop pt became hypotensive requiring neo-synephrine gtt PCCM consulted to assist with management.    02/08/22- Met with daughter and reviewed in [place] and  had GOC.   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 Leafy Ro.  Postop developed hypotension requiring neo-synephrine gtt PCCM consulted to assist with management 06/1: CT Abd Pelvis revealed acute perforated sigmoid         diverticulitis as above. Diffuse pneumoperitoneum, with small        gas/fluid collection at the site of diverticulitis. No rim        enhancement to suggest abscess at this time. Left lower lobe        consolidation, favor pneumonia. Please see previous chest x-ray        discussion. Stable findings of a static lung cancer, with        mediastinal adenopathy and right lower lobe nodularity        unchanged. Please see recent chest CT 12/31/2021 full        description. Hepatic steatosis. Aortic Atherosclerosis       (ICD10-I70.0). 06/1: CT Head revealed LEFT parietal white matter vasogenic        edema due to underlying known metastatic lesion as       demonstrated by prior MR. No new intracranial abnormalities.  Micro Data:  Aerobic/Anaerobic 06/1>> MRSA PCR 06/1>> COVID-19 by PCR 06/1>>negative  Blood x2 06/1>>  Antimicrobials:  Cefepime 06/1>> Vancomycin 06/1>> Flagyl 06/1>>  Interim History / Subjective:  Pt with increased work of breathing and wheezing throughout.  Pt also hypotensive sbp 70's despite iv fluid resuscitation, therefore neo-synephrine gtt initiated   Objective   Blood pressure 94/68,  pulse 81, temperature (!) 97 F (36.1 C), temperature source Axillary, resp. rate 18, height 5' 1" (1.549 m), weight 59.1 kg, SpO2 96 %.    FiO2 (%):  [60 %-80 %] 80 %   Intake/Output Summary (Last 24 hours) at 02/08/2022 1036 Last data filed at 02/08/2022 1000 Gross per 24 hour  Intake 6927.15  ml  Output 915 ml  Net 6012.15 ml    Filed Weights   02/20/2022 1536 02/25/2022 2314  Weight: 59 kg 59.1 kg   Examination: General: Acute on chronically ill appearing female, in respiratory distress on NRB  HENT: Supple, no JVD present  Lungs: Faint wheezes throughout, tachypneic with use of accessory muscles to breath  Cardiovascular: NSR, rrr, no r/g, 2+ radial/1+ distal pulses, generalized edema present  Abdomen: no audible BS present, tenderness present     Extremities: Moves all extremities  Neuro: Lethargic oriented, follows commands, PERRLA GU: Indwelling foley catheter draining clear yellow urine   Resolved Hospital Problem list     Assessment & Plan:   Acute on chronic hypoxic respiratory failure secondary to LLL pneumonia, metabolic acidosis in the setting of DKA, and stage IV adenocarcinoma of the lung COPD  - Supplemental O2 for dyspnea and/or hypoxia  - Scheduled and prn bronchodilator therapy  - Nebulized steroids  - Continue abx as outlined above  - Aggressive pulmonary hygiene  Septic shock  -present on addimission  -secondary to perforated sigmoid diverticulitis with diffuse pneumoperitoneum and LLL pneumonia s/p exploratory laparotomy/Hartman's found to have bleeding in the left adnexa requiring open left salpingectomy~006/29/2023 - Continuous telemetry monitoring  - Aggressive iv fluid resuscitation and prn neo-synephrine gtt to maintain map >65 - Continue abx therapy as outlined above - General surgery consulted appreciate input: dressing changes to midline abdominal incisions per recommendations  - NGT to LIS   Metabolic acidosis and lactic acidosis in setting of sepsis and DKA  Hyponatremia secondary to volume depletion  - Trend BMP, lactic acid, and VBG - Replace electrolytes as indicated  - Monitor UOP - Avoid nephrotoxic medications  - 2 amps of sodium bicarb   Diabetic ketoacidosis  - Will recheck BMP if ruled in for DKA will restart insulin gtt  and DKA protocol   Chronic thrombocytopenia  - Trend CBC  - Monitor for s/sx of bleeding - Transfuse for platelet count <50 or signs of bleeding and/or hgb <7 - Avoid chemical VTE px   Postop pain  - Prn oxycodone, tramadol, and dilaudid for pain management   Best Practice (right click and "Reselect all SmartList Selections" daily)   Diet/type: NPO DVT prophylaxis: SCD GI prophylaxis: PPI Lines: N/A Foley:  Yes, and it is still needed Code Status:  full code Last date of multidisciplinary goals of care discussion [N/A]  Will place palliative care consult to discuss goals of treatment and code status  Labs   CBC: Recent Labs  Lab 02/26/2022 1347 02/21/2022 1610 02/14/2022 2326 02/08/22 0410  WBC 12.0* 9.0 9.3 9.2  NEUTROABS 9.3* 6.9 7.1  --   HGB 14.6 14.7 13.1 11.6*  HCT 41.3 43.6 39.8 34.2*  MCV 92.8 94.6 96.6 94.5  PLT 95* 94* 76* 74*     Basic Metabolic Panel: Recent Labs  Lab 03/05/2022 1347 02/19/2022 1610 02/28/2022 2326 02/08/22 0410  NA 127* 132* 134* 138  K 4.6 4.4 3.7 3.0*  CL 95* 98 109 108  CO2 18* 17* 16* 26  GLUCOSE 429* 441* 192* 206*  BUN 27* 31* 26* 24*  CREATININE 0.71 0.77 0.61 0.43*  CALCIUM 9.4 9.7 8.0* 7.6*  MG  --   --   --  1.4*  PHOS  --   --   --  2.8    GFR: Estimated Creatinine Clearance: 63.3 mL/min (A) (by C-G formula based on SCr of 0.43 mg/dL (L)). Recent Labs  Lab 02/16/2022 1347 02/16/2022 1610 02/23/2022 1610 03/05/2022 1810 02/19/2022 2326 02/14/2022 2345 02/08/22 0158 02/08/22 0410  PROCALCITON  --   --   --   --  2.74  --   --  3.99  WBC 12.0* 9.0  --   --  9.3  --   --  9.2  LATICACIDVEN  --  6.2*   < > 3.5*  --  6.8* 4.6* 2.3*   < > = values in this interval not displayed.     Liver Function Tests: Recent Labs  Lab 02/16/2022 1610 03/01/2022 2326 02/08/22 0410  AST 56* 55* 40  ALT 61* 47* 40  ALKPHOS 122 78 50  BILITOT 1.0 0.7 0.8  PROT 6.0* 4.3* 4.0*  ALBUMIN 2.1* <1.5* 1.9*    No results for input(s): LIPASE,  AMYLASE in the last 168 hours. No results for input(s): AMMONIA in the last 168 hours.  ABG    Component Value Date/Time   PHART 7.27 (L) 03/03/2022 2235   PCO2ART 28 (L) 02/22/2022 2235   PO2ART 81 (L) 02/25/2022 2235   HCO3 27.2 02/08/2022 0410   O2SAT 76.2 02/08/2022 0410      Coagulation Profile: Recent Labs  Lab 03/06/2022 1347  INR 1.1     Cardiac Enzymes: No results for input(s): CKTOTAL, CKMB, CKMBINDEX, TROPONINI in the last 168 hours.  HbA1C: Hgb A1c MFr Bld  Date/Time Value Ref Range Status  01/20/2022 05:00 PM 7.3 (H) 4.8 - 5.6 % Final    Comment:    (NOTE) Pre diabetes:          5.7%-6.4%  Diabetes:              >6.4%  Glycemic control for   <7.0% adults with diabetes   01/18/2022 08:03 PM 7.1 (H) 4.8 - 5.6 % Final    Comment:    (NOTE) Pre diabetes:          5.7%-6.4%  Diabetes:              >6.4%  Glycemic control for   <7.0% adults with diabetes     CBG: Recent Labs  Lab 03/06/2022 2016 03/02/2022 2209 03/06/2022 2312 02/08/22 0403 02/08/22 0733  GLUCAP 291* 266* 190* 190* 153*     Review of Systems: Positives in BOLD   Gen: Denies fever, chills, weight change, fatigue, night sweats HEENT: Denies blurred vision, double vision, hearing loss, tinnitus, sinus congestion, rhinorrhea, sore throat, neck stiffness, dysphagia PULM: Denies shortness of breath, cough, sputum production, hemoptysis, wheezing CV: Denies chest pain, edema, orthopnea, paroxysmal nocturnal dyspnea, palpitations GI: abdominal pain, nausea, vomiting, diarrhea, hematochezia, melena, constipation, change in bowel habits GU: Denies dysuria, hematuria, polyuria, oliguria, urethral discharge Endocrine: Denies hot or cold intolerance, polyuria, polyphagia or appetite change Derm: Denies rash, dry skin, scaling or peeling skin change Heme: Denies easy bruising, bleeding, bleeding gums Neuro: Denies headache, numbness, weakness, slurred speech, loss of memory or consciousness    Past Medical History:  She,  has a past medical history of Graves disease, Malignant pericardial effusion, Paroxysmal atrial fibrillation ([place]), and Primary lung adenocarcinoma ([place]).   Surgical History:   Past Surgical  History:  Procedure Laterality Date   COLECTOMY WITH COLOSTOMY CREATION/HARTMANN PROCEDURE N/A 02/22/2022   Procedure: COLECTOMY WITH COLOSTOMY CREATION/HARTMANN PROCEDURE;  Surgeon: Benjamine Sprague, DO;  Location: [facility] ORS;  Service: General;  Laterality: N/A;   IR CV LINE INJECTION  06/19/2021   IR IMAGING GUIDED PORT INSERTION  04/27/2021   LAPAROTOMY N/A 03/05/2022   Procedure: EXPLORATORY LAPAROTOMY;  Surgeon: Benjamine Sprague, DO;  Location: [facility] ORS;  Service: General;  Laterality: N/A;   PERICARDIOCENTESIS N/A 03/27/2021   Procedure: PERICARDIOCENTESIS;  Surgeon: Nelva Bush, MD;  Location: [place] [facility];  Service: Cardiovascular;  Laterality: N/A;   TUBAL LIGATION     UNILATERAL SALPINGECTOMY Left 03/04/2022   Procedure: OPEN LEFT SALPINGECTOMY;  Surgeon: Benjamine Sprague, DO;  Location: [facility] ORS;  Service: General;  Laterality: Left;     Social History:   reports that she has quit smoking. Her smoking use included cigarettes. She has a 40.00 pack-year smoking history. She has never used smokeless tobacco. She reports that she does not currently use drugs after having used the following drugs: Marijuana. She reports that she does not drink alcohol.   Family History:  Her family history includes Peripheral Artery Disease in her mother. There is no history of Breast cancer.   Allergies Allergies  Allergen Reactions   Diazepam Other (See Comments)    Very high emotionally and very low emotionally, hyperventilate and passes out Very high emotionally and very low emotionally, hyperventilate and passes out    Keytruda [Pembrolizumab] Rash   Metoprolol    Tapazole [Methimazole]      Home Medications  Prior to Admission medications   Medication Sig Start Date End  Date Taking? Authorizing Provider  bismuth subsalicylate (PEPTO BISMOL) 262 MG/15ML suspension Take 30 mLs by mouth every 6 (six) hours as needed. Patient not taking: Reported on 03/04/2022    [provider]  dexamethasone (DECADRON) 2 MG tablet Take 1 tablet (2 mg total) by mouth 2 (two) times daily. 01/21/22 02/20/22  Enzo Bi, MD  diltiazem (CARDIZEM CD) 180 MG 24 hr capsule Take 1 capsule (180 mg total) by mouth daily. 11/28/21 02/26/22  Rise Mu, PA-C  folic acid (FOLVITE) 1 MG tablet Take 1 tablet (1 mg total) by mouth once daily. 10/15/21   Cammie Sickle, MD  insulin glargine (LANTUS) 100 UNIT/ML Solostar Pen Inject 10 Units into the skin at bedtime. 01/21/22 04/21/22  Enzo Bi, MD  Insulin Pen Needle 31G X 6 MM MISC 1 Units by Does not apply route at bedtime. 01/21/22   Enzo Bi, MD  metFORMIN (GLUCOPHAGE) 500 MG tablet Take 2 tablets (1,000 mg total) by mouth 2 (two) times daily with a meal. 01/31/22   Cammie Sickle, MD  Multiple Vitamin (MULTI-VITAMINS) TABS Take 1 tablet by mouth daily. Patient not taking: Reported on 02/16/2022    [provider]  ondansetron (ZOFRAN) 4 MG tablet Take 2 tablets (45m total) by mouth once every 8 hours as needed for nausea and vomiting. 04/16/21   BCammie Sickle MD  TURMERIC PO Take 1 tablet by mouth daily. Patient not taking: Reported on 03/08/2022    [provider]     Critical care provider statement:   Total critical care time: 33 minutes   Performed by: ALanney GinsMD   Critical care time was exclusive of separately billable procedures and treating other patients.   Critical care was necessary to treat or prevent imminent or life-threatening deterioration.   Critical care was  time spent personally by me on the following activities: development of treatment plan with patient and/or surrogate as well as nursing, discussions with consultants, evaluation of patient's response to treatment, examination of  patient, obtaining history from patient or surrogate, ordering and performing treatments and interventions, ordering and review of laboratory studies, ordering and review of radiographic studies, pulse oximetry and re-evaluation of patient's condition.    Ottie Glazier, M.D.  Pulmonary & Critical Care Medicine

## 2022-02-08 NOTE — Progress Notes (Signed)
I spoke to patient's daughter, summer explained the difficult situation, and the fact that we cannot resume chemotherapy given perforation/pneumonia. She understands the overall prognosis is guarded. Patient is limited code.

## 2022-02-08 NOTE — Plan of Care (Signed)

## 2022-02-08 NOTE — Progress Notes (Signed)
Pharmacy Antibiotic Note  Nichole Park is a 61 y.o. female admitted on 03/06/2022 with sepsis.  Pharmacy has been consulted for Cefepime and Vancomycin dosing.  Plan: Cefepime 2 gm q8h per indication & renal fxn.  Pt given initial dose of Vancomycin 1 gm. Ordered additional dose of Vancomycin 500 mg x 1. Vancomycin 1250 mg IV Q 24 hrs.  Goal AUC 400-550. Expected AUC: 541.2 SCr used: 0.77  Pharmacy will continue to follow and will adjust abx dosing whenever warranted.  Height: 5\' 1"  (154.9 cm) Weight: 59.1 kg (130 lb 4.7 oz) IBW/kg (Calculated) : 47.8  Temp (24hrs), Avg:97.9 F (36.6 C), Min:97.5 F (36.4 C), Max:98.8 F (37.1 C)  Recent Labs  Lab 02/23/2022 1347 02/25/2022 1610 02/17/2022 1810 02/27/2022 2326  WBC 12.0* 9.0  --  9.3  CREATININE 0.71 0.77  --   --   LATICACIDVEN  --  6.2* 3.5*  --     Estimated Creatinine Clearance: 63.3 mL/min (by C-G formula based on SCr of 0.77 mg/dL).    Allergies  Allergen Reactions   Diazepam Other (See Comments)    Very high emotionally and very low emotionally, hyperventilate and passes out Very high emotionally and very low emotionally, hyperventilate and passes out    Keytruda [Pembrolizumab] Rash   Metoprolol    Tapazole [Methimazole]     Antimicrobials this admission: 6/1 Cefepime >>  6/1 Vancomycin >>  6/2 Flagyl >>  Microbiology results: 6/1 BCx: Pending 6/1 WoundCx: Pending   Thank you for allowing pharmacy to be a part of this patient's care.  Renda Rolls, PharmD, MBA 02/08/2022 12:20 AM

## 2022-02-08 NOTE — Progress Notes (Signed)
Subjective:  CC: Nichole Park is a 61 y.o. female  Hospital stay day 1, 1 Day Post-Op [facility] for perforated diverticulitis  HPI: Transferred to ICU post op due to septic shock.  Abdominal pain reported. No flatus or stool in bag.  ROS:  General: Denies weight loss, weight gain, fatigue, fevers, chills, and night sweats. Heart: Denies chest pain, palpitations, racing heart, irregular heartbeat, leg pain or swelling, and decreased activity tolerance. Respiratory: Denies breathing difficulty, shortness of breath, wheezing, cough, and sputum. GI: Denies change in appetite, heartburn, nausea, vomiting, constipation, diarrhea, and blood in stool. GU: Denies difficulty urinating, pain with urinating, urgency, frequency, blood in urine.   Objective:   Temp:  [97 F (36.1 C)-98.8 F (37.1 C)] 97.8 F (36.6 C) (06/02 1215) Pulse Rate:  [27-116] 89 (06/02 1445) Resp:  [13-40] 18 (06/02 1445) BP: (77-146)/(50-106) 91/73 (06/02 1445) SpO2:  [83 %-100 %] 93 % (06/02 1445) FiO2 (%):  [60 %-80 %] 65 % (06/02 1419) Weight:  [59 kg-59.1 kg] 59.1 kg (06/01 2314)     Height: 5\' 1"  (154.9 cm) Weight: 59.1 kg BMI (Calculated): 24.63   Intake/Output this shift:   Intake/Output Summary (Last 24 hours) at 02/08/2022 1518 Last data filed at 02/08/2022 1400 Gross per 24 hour  Intake 7587.77 ml  Output 1040 ml  Net 6547.77 ml    Constitutional :  alert, cooperative, appears stated age, and mild distress  Respiratory:  Wheezing noted.  On high flow [place]  Cardiovascular:  regular rate and rhythm  Gastrointestinal: Soft, no guarding but focal TTP ostomy swollen, beefy red, no active bleeding . NG with bilious output  Skin: Cool and moist. Incision site dressing intact  Psychiatric: Normal affect, non-agitated, not confused       LABS:     Latest Ref Rng & Units 02/08/2022   10:21 AM 02/08/2022    4:10 AM 03/05/2022   11:26 PM  CMP  Glucose 70 - 99 mg/dL 162   206   192    BUN 6 - 20 mg/dL 21   24   26      Creatinine 0.44 - 1.00 mg/dL 0.38   0.43   0.61    Sodium 135 - 145 mmol/L 139   138   134    Potassium 3.5 - 5.1 mmol/L 3.5   3.0   3.7    Chloride 98 - 111 mmol/L 103   108   109    CO2 22 - 32 mmol/L 26   26   16     Calcium 8.9 - 10.3 mg/dL 8.0   7.6   8.0    Total Protein 6.5 - 8.1 g/dL  4.0   4.3    Total Bilirubin 0.3 - 1.2 mg/dL  0.8   0.7    Alkaline Phos 38 - 126 U/L  50   78    AST 15 - 41 U/L  40   55    ALT 0 - 44 U/L  40   47        Latest Ref Rng & Units 02/08/2022    4:10 AM 03/05/2022   11:26 PM 03/07/2022    4:10 PM  CBC  WBC 4.0 - 10.5 K/uL 9.2   9.3   9.0    Hemoglobin 12.0 - 15.0 g/dL 11.6   13.1   14.7    Hematocrit 36.0 - 46.0 % 34.2   39.8   43.6    Platelets 150 - 400  K/uL 74   76   94      RADS: N/a Assessment:   S/p [facility] for perforated diverticulitis.  Continue supportive care per ICU team.  Prognosis still guarded due to persistent need for pressors and fragile respiratory status.  Palliative consult already in place.    labs/images/medications/previous chart entries reviewed personally and relevant changes/updates noted above.

## 2022-02-08 NOTE — Consult Note (Signed)
[facility] NOTE  Patient Care Team: [place] as PCP - General End, Harrell Gave, MD as PCP - Cardiology (Cardiology) Telford Nab, RN as Oncology Nurse Navigator Cammie Sickle, MD as Consulting Physician (Oncology)  CHIEF COMPLAINTS/PURPOSE OF CONSULTATION: Lung cancer/bowel perforation  HISTORY OF PRESENTING ILLNESS:  [place] 61 y.o.  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    Patient was seen in the clinic on the day of admission-with worsening nausea vomiting abdominal pain.  On further evaluation emergency room noted to have- Acute perforated sigmoid diverticulitis as above. Diffuse pneumoperitoneum, with small gas/fluid collection at the site of diverticulitis. No rim enhancement to suggest abscess at this time. . Left lower lobe consolidation, favor pneumonia.   Patient was emergently taken to the OR, Dr.Sakai - patient s/p left hemi-colectomy on 6/01.  Patient is currently in the ICU.  50% FiO2 high flow nasal cannula oxygen; pressors.  With regards to history of metastatic lung cancer most recently on Keytruda Alimta maintenance, which is currently on hold because of recent progression.  Patient currently complains of mild to moderate abdominal pain/incisional pain.  She feels weak.   Review of Systems  Unable to perform ROS: Acuity of condition    MEDICAL HISTORY:  Past Medical History:  Diagnosis Date   Graves disease    Malignant pericardial effusion    Paroxysmal atrial fibrillation ([place])    Primary lung adenocarcinoma ([place])     SURGICAL HISTORY: Past Surgical History:  Procedure Laterality Date   COLECTOMY WITH COLOSTOMY CREATION/HARTMANN PROCEDURE N/A 02/14/2022   Procedure: COLECTOMY WITH COLOSTOMY CREATION/HARTMANN PROCEDURE;  Surgeon: Benjamine Sprague, DO;  Location: [facility] ORS;  Service: General;   Laterality: N/A;   IR CV LINE INJECTION  06/19/2021   IR IMAGING GUIDED PORT INSERTION  04/27/2021   LAPAROTOMY N/A 03/06/2022   Procedure: EXPLORATORY LAPAROTOMY;  Surgeon: Benjamine Sprague, DO;  Location: [facility] ORS;  Service: General;  Laterality: N/A;   PERICARDIOCENTESIS N/A 03/27/2021   Procedure: PERICARDIOCENTESIS;  Surgeon: Nelva Bush, MD;  Location: [facility];  Service: Cardiovascular;  Laterality: N/A;   TUBAL LIGATION     UNILATERAL SALPINGECTOMY Left 03/02/2022   Procedure: OPEN LEFT SALPINGECTOMY;  Surgeon: Benjamine Sprague, DO;  Location: [facility] ORS;  Service: General;  Laterality: Left;    SOCIAL HISTORY: Social History   Socioeconomic History   Marital status: Widowed    Spouse name: Not on file   Number of children: Not on file   Years of education: Not on file   Highest education level: Not on file  Occupational History   Not on file  Tobacco Use   Smoking status: Former    Packs/day: 1.00    Years: 40.00    Pack years: 40.00    Types: Cigarettes   Smokeless tobacco: Never  Vaping Use   Vaping Use: Never used  Substance and Sexual Activity   Alcohol use: No   Drug use: Not Currently    Types: Marijuana   Sexual activity: Not Currently  Other Topics Concern   Not on file  Social History Narrative   Not on file   Social Determinants of Health   Financial Resource Strain: Medium Risk   Difficulty of Paying Living Expenses: Somewhat hard  Food Insecurity: Food Insecurity Present   Worried About Running Out of Food in the Last Year: Never true   Ran Out  of Food in the Last Year: Sometimes true  Transportation Needs: No Transportation Needs   Lack of Transportation (Medical): No   Lack of Transportation (Non-Medical): No  Physical Activity: Inactive   Days of Exercise per Week: 0 days   Minutes of Exercise per Session: 0 min  Stress: Stress Concern Present   Feeling of Stress : Rather much  Social Connections: Moderately Integrated   Frequency of  Communication with Friends and Family: Three times a week   Frequency of Social Gatherings with Friends and Family: Three times a week   Attends Religious Services: Never   Active Member of Clubs or Organizations: Yes   Attends Archivist Meetings: Never   Marital Status: Living with partner  Intimate Partner Violence: Not At Risk   Fear of Current or Ex-Partner: No   Emotionally Abused: No   Physically Abused: No   Sexually Abused: No    FAMILY HISTORY: Family History  Problem Relation Age of Onset   Peripheral Artery Disease Mother    Breast cancer Neg Hx     ALLERGIES:  is allergic to diazepam, keytruda [pembrolizumab], metoprolol, and tapazole [methimazole].  MEDICATIONS:  Current Facility-Administered Medications  Medication Dose Route Frequency Provider Last Rate Last Admin   0.9 %  sodium chloride infusion  250 mL Intravenous Continuous Teressa Lower, NP   Stopped at 02/08/22 0858   albumin human 25 % solution 25 g  25 g Intravenous Q6H Rust-Chester, Britton L, NP 60 mL/hr at 02/08/22 2102 25 g at 02/08/22 2102   budesonide (PULMICORT) nebulizer solution 0.5 mg  0.5 mg Nebulization BID Teressa Lower, NP   0.5 mg at 02/08/22 1948   celecoxib (CELEBREX) capsule 200 mg  200 mg Oral BID Sakai, Isami, DO       Chlorhexidine Gluconate Cloth 2 % PADS 6 each  6 each Topical Daily Teressa Lower, NP   6 each at 02/08/22 0945   dextrose 50 % solution            folic acid (FOLVITE) tablet 1 mg  1 mg Oral Daily Sakai, Isami, DO       HYDROmorphone (DILAUDID) injection 0.5 mg  0.5 mg Intravenous Q3H PRN Sakai, Isami, DO       insulin aspart (novoLOG) injection 0-15 Units  0-15 Units Subcutaneous Q4H Teressa Lower, NP   3 Units at 02/08/22 9024   ipratropium-albuterol (DUONEB) 0.5-2.5 (3) MG/3ML nebulizer solution 3 mL  3 mL Nebulization Q6H Teressa Lower, NP   3 mL at 02/08/22 1948   ipratropium-albuterol (DUONEB) 0.5-2.5 (3) MG/3ML nebulizer solution 3 mL  3 mL  Nebulization Q6H PRN Teressa Lower, NP   3 mL at 03/03/2022 2245   lactated ringers infusion   Intravenous Continuous Rust-Chester, Britton L, NP 75 mL/hr at 02/08/22 2058 Rate Change at 02/08/22 2058   MEDLINE mouth rinse  15 mL Mouth Rinse BID Teressa Lower, NP   15 mL at 02/08/22 2106   methylPREDNISolone sodium succinate (SOLU-MEDROL) 40 mg/mL injection 20 mg  20 mg Intravenous Q12H Sakai, Isami, DO   20 mg at 02/08/22 1212   metroNIDAZOLE (FLAGYL) IVPB 500 mg  500 mg Intravenous Q12H Ottie Glazier, MD   Stopped at 02/08/22 1311   ondansetron (ZOFRAN-ODT) disintegrating tablet 4 mg  4 mg Oral Q6H PRN Lysle Pearl, Isami, DO       Or   ondansetron (ZOFRAN) injection 4 mg  4 mg Intravenous Q6H PRN Benjamine Sprague,  DO       oxyCODONE (Oxy IR/ROXICODONE) immediate release tablet 5-10 mg  5-10 mg Oral Q4H PRN Sakai, Isami, DO       pantoprazole (PROTONIX) injection 40 mg  40 mg Intravenous QHS Sakai, Isami, DO   40 mg at 02/08/22 2105   phenylephrine CONCENTRATED 100mg  in sodium chloride 0.9% 231mL (0.4mg /mL) premix infusion  0-400 mcg/min Intravenous Titrated Teressa Lower, NP 3.75 mL/hr at 02/08/22 1800 25 mcg/min at 02/08/22 1800   [START ON 02/09/2022] piperacillin-tazobactam (ZOSYN) IVPB 3.375 g  3.375 g Intravenous Q8H Ottie Glazier, MD       sodium chloride flush (NS) 0.9 % injection 10-40 mL  10-40 mL Intracatheter Q12H Sakai, Isami, DO   10 mL at 02/08/22 1212   sodium chloride flush (NS) 0.9 % injection 10-40 mL  10-40 mL Intracatheter PRN Lysle Pearl, Isami, DO       traMADol (ULTRAM) tablet 50 mg  50 mg Oral Q6H PRN Sakai, Isami, DO          .  PHYSICAL EXAMINATION:  Vitals:   02/08/22 2200 02/08/22 2215  BP: 101/73 (!) 88/62  Pulse: 81 79  Resp: 16 16  Temp:    SpO2: 95% 95%   Filed Weights   02/26/2022 1536 03/06/2022 2314  Weight: 130 lb (59 kg) 130 lb 4.7 oz (59.1 kg)   Patient currently on high flow nasal oxygen 50%; also on pressors; abdomen mildly tender; Hartmann pouch  present-oozing blood. Physical Exam Vitals and nursing note reviewed.  HENT:     Head: Normocephalic and atraumatic.     Mouth/Throat:     Pharynx: Oropharynx is clear.  Eyes:     Extraocular Movements: Extraocular movements intact.     Pupils: Pupils are equal, round, and reactive to light.  Cardiovascular:     Rate and Rhythm: Normal rate and regular rhythm.  Pulmonary:     Comments: Decreased breath sounds bilaterally.  Abdominal:     Palpations: Abdomen is soft.  Musculoskeletal:        General: Normal range of motion.     Cervical back: Normal range of motion.  Skin:    General: Skin is warm.  Neurological:     General: No focal deficit present.     Mental Status: She is alert and oriented to person, place, and time.  Psychiatric:        Behavior: Behavior normal.        Judgment: Judgment normal.     LABORATORY DATA:  I have reviewed the data as listed Lab Results  Component Value Date   WBC 9.2 02/08/2022   HGB 11.6 (L) 02/08/2022   HCT 34.2 (L) 02/08/2022   MCV 94.5 02/08/2022   PLT 74 (L) 02/08/2022   Recent Labs    03/27/21 1635 03/28/21 0131 02/18/2022 1610 02/18/2022 2326 02/08/22 0410 02/08/22 1021  NA  --    < > 132* 134* 138 139  K  --    < > 4.4 3.7 3.0* 3.5  CL  --    < > 98 109 108 103  CO2  --    < > 17* 16* 26 26  GLUCOSE  --    < > 441* 192* 206* 162*  BUN  --    < > 31* 26* 24* 21*  CREATININE  --    < > 0.77 0.61 0.43* 0.38*  CALCIUM  --    < > 9.7 8.0* 7.6* 8.0*  GFRNONAA  --    < > >  60 >60 >60 >60  PROT 7.7   < > 6.0* 4.3* 4.0*  --   ALBUMIN 3.2*   < > 2.1* <1.5* 1.9*  --   AST 31   < > 56* 55* 40  --   ALT 21   < > 61* 47* 40  --   ALKPHOS 74   < > 122 78 50  --   BILITOT 1.2   < > 1.0 0.7 0.8  --   BILIDIR 0.4*  --   --   --  0.2  --   IBILI 0.8  --   --   --  0.6  --    < > = values in this interval not displayed.    RADIOGRAPHIC STUDIES: I have personally reviewed the radiological images as listed and agreed with the  findings in the report. DG Abd 1 View  Result Date: 02/17/2022 CLINICAL DATA:  NG tube placement. EXAM: ABDOMEN - 1 VIEW COMPARISON:  CT earlier today FINDINGS: Tip and side port of the enteric tube below the diaphragm in the stomach. There is excreted IV contrast within both renal collecting systems from CT earlier today. Surgical drain in the left abdomen. IMPRESSION: Tip and side port of the enteric tube below the diaphragm in the stomach. Electronically Signed   By: Keith Rake M.D.   On: 02/19/2022 23:49   CT Head Wo Contrast  Result Date: 02/21/2022 CLINICAL DATA:  Confusion, anorexia, severe abdominal pain, swelling in hands and feet, shortness of breath, history lung cancer EXAM: CT HEAD WITHOUT CONTRAST TECHNIQUE: Contiguous axial images were obtained from the base of the skull through the vertex without intravenous contrast. RADIATION DOSE REDUCTION: This exam was performed according to the departmental dose-optimization program which includes automated exposure control, adjustment of the mA and/or kV according to patient size and/or use of iterative reconstruction technique. COMPARISON:  01/18/2022 CT head, 01/19/2022 MR head FINDINGS: Brain: Normal ventricular morphology. No midline shift or mass effect. Again identified area of low attenuation LEFT parietal white matter, 3.5 cm diameter, consistent with vasogenic edema, patient with known metastatic lesion by prior MR. No intracranial hemorrhage, additional mass lesion or evidence of acute infarction. No extra-axial fluid collections. Vascular: Minimal atherosclerotic calcification of internal carotid arteries at skull base Skull: Intact Sinuses/Orbits: Clear Other: N/A IMPRESSION: LEFT parietal white matter vasogenic edema due to underlying known metastatic lesion as demonstrated by prior MR. No new intracranial abnormalities. Electronically Signed   By: Lavonia Dana M.D.   On: 03/03/2022 16:59   CT HEAD WO CONTRAST (5MM)  Result Date:  01/18/2022 CLINICAL DATA:  History of metastatic non-small cell lung cancer. EXAM: CT HEAD WITHOUT CONTRAST TECHNIQUE: Contiguous axial images were obtained from the base of the skull through the vertex without intravenous contrast. RADIATION DOSE REDUCTION: This exam was performed according to the departmental dose-optimization program which includes automated exposure control, adjustment of the mA and/or kV according to patient size and/or use of iterative reconstruction technique. COMPARISON:  Head CT and MRI brain 12/31/2021. FINDINGS: Brain: Overall definite interval improvement in the amount of tumoral edema seen in the left parietal white matter. The actual metastatic focus is difficult to identify this study without contrast and is better seen on the MRI. No new areas of edema to suggest other metastatic lesions. No evidence of acute hemispheric infarction or intracranial hemorrhage. The brainstem and cerebellum are grossly and stable. Vascular: Minimal vascular calcifications are stable. No hyperdense vessels. Skull: No skull lesions. Sinuses/Orbits:  The paranasal sinuses and mastoid air cells are clear. The globes are intact. Other: No scalp lesions or scalp hematoma. IMPRESSION: 1. Overall definite interval improvement in the amount of tumoral edema in the left parietal white matter. The actual metastatic focus is difficult to identify this study without contrast and is better seen on the MRI. 2. No new areas of edema to suggest other metastatic lesions. Electronically Signed   By: Marijo Sanes M.D.   On: 01/18/2022 14:30   MR BRAIN W WO CONTRAST  Result Date: 01/20/2022 CLINICAL DATA:  Initial evaluation for brain CNS neoplasm. EXAM: MRI HEAD WITHOUT AND WITH CONTRAST TECHNIQUE: Multiplanar, multiecho pulse sequences of the brain and surrounding structures were obtained without and with intravenous contrast. CONTRAST:  90mL GADAVIST GADOBUTROL 1 MMOL/ML IV SOLN COMPARISON:  Prior MRI from 12/31/2021.  FINDINGS: Brain: Previously identified intracranial metastatic deposit positioned at the left parietal lobe again seen. Overall, lesion is not significantly changed in overall size measuring 1.7 x 1.7 x 1.9 cm. However, the lesion demonstrates diminished peripheral enhancement as compared to previous exam. Additionally, associated vasogenic edema within the adjacent left parietal region is also improved. Improved mass effect on the left lateral ventricle without significant midline shift. Overall, changes are consistent with interval response to therapy. No new lesions identified. No acute or subacute infarct. Gray-white matter differentiation otherwise maintained. Underlying mild chronic microvascular ischemic disease noted. No acute intracranial hemorrhage. Small focus of chronic hemosiderin staining at the left frontal lobe noted, stable. No other new acute or chronic intracranial blood products. No other mass lesion or mass effect. No hydrocephalus or extra-axial fluid collection. Pituitary gland suprasellar region normal. Midline structures intact and normal. No other abnormal enhancement. Vascular: Major intracranial vascular flow voids are maintained. Skull and upper cervical spine: Craniocervical junction with normal limits. Bone marrow signal intensity normal. No focal marrow replacing lesion. No scalp soft tissue abnormality. Sinuses/Orbits: Globes and orbital soft tissues within normal limits. Mild scattered mucosal thickening noted about the sphenoid ethmoidal and maxillary sinuses. Small bilateral mastoid effusions noted. Other: None. IMPRESSION: 1. Similar size of left parietal metastatic lesion, but with decreased enhancement and associated vasogenic edema, consistent with interval response to therapy. Secondary improved mass effect within the left parietal region as compared to previous. No new lesions identified. 2. No other new or acute intracranial abnormality. Electronically Signed   By: Jeannine Boga M.D.   On: 01/20/2022 01:50   CT ABDOMEN PELVIS W CONTRAST  Result Date: 02/28/2022 CLINICAL DATA:  Abdominal pain, lower extremity swelling, short of breath worsening over last 3 days, history of lung cancer EXAM: CT ABDOMEN AND PELVIS WITH CONTRAST TECHNIQUE: Multidetector CT imaging of the abdomen and pelvis was performed using the standard protocol following bolus administration of intravenous contrast. RADIATION DOSE REDUCTION: This exam was performed according to the departmental dose-optimization program which includes automated exposure control, adjustment of the mA and/or kV according to patient size and/or use of iterative reconstruction technique. CONTRAST:  140mL OMNIPAQUE IOHEXOL 300 MG/ML  SOLN COMPARISON:  12/31/2021, 10/01/2021, 02/17/2022 FINDINGS: Lower chest: Dense left lower lobe consolidation favor pneumonia given rapid development since prior exams. Nodularity within the right middle and right lower lobe consistent with known metastatic lung cancer. Necrotic appearing 1 cm lymph node anterior to the aorta in the left infrahilar region unchanged. Hepatobiliary: Hepatic steatosis. No focal liver abnormality. Gallbladder is unremarkable. No biliary duct dilation. Pancreas: Unremarkable. No pancreatic ductal dilatation or surrounding inflammatory changes. Spleen: Normal in size  without focal abnormality. Adrenals/Urinary Tract: Stable appearance of the kidneys. No urinary tract calculi or obstruction. The adrenals and bladder are unremarkable. Stomach/Bowel: There is distal colonic diverticulosis, with wall thickening and fat stranding of the sigmoid colon consistent with acute diverticulitis. There is free fluid in free gas adjacent to the inflamed sigmoid colon consistent with perforation. Small multilocular gas and fluid collection along the left pelvic sidewall measures up to 2.1 x 2.9 cm, without rim enhancement to suggest abscess at this time. No bowel obstruction or ileus.  Mild wall thickening of the small bowel within the lower central abdomen adjacent to the perforated sigmoid colon likely secondary inflammation. Vascular/Lymphatic: Stable aortic atherosclerosis. No pathologic adenopathy. Reproductive: Uterus and bilateral adnexa are unremarkable. Other: Pneumoperitoneum from perforated sigmoid diverticulitis as above. Small amount of free fluid within the lower abdomen, with localized gas/fluid collection adjacent to the sigmoid diverticulitis as described above. No evidence of abdominal wall hernia. Musculoskeletal: No acute or destructive bony lesions. No acute or destructive bony scotch that reconstructed images demonstrate no additional findings. IMPRESSION: 1. Acute perforated sigmoid diverticulitis as above. Diffuse pneumoperitoneum, with small gas/fluid collection at the site of diverticulitis. No rim enhancement to suggest abscess at this time. 2. Left lower lobe consolidation, favor pneumonia. Please see previous chest x-ray discussion. 3. Stable findings of a static lung cancer, with mediastinal adenopathy and right lower lobe nodularity unchanged. Please see recent chest CT 12/31/2021 full description. 4. Hepatic steatosis. 5.  Aortic Atherosclerosis (ICD10-I70.0). Critical Value/emergent results were called by telephone at the time of interpretation on 02/09/2022 at 5:05 pm to provider [facility] , who verbally acknowledged these results. Electronically Signed   By: Randa Ngo M.D.   On: 02/27/2022 17:05   DG Chest Port 1 View  Result Date: 02/08/2022 CLINICAL DATA:  Respiratory failure, right upper lobe lung cancer. EXAM: PORTABLE CHEST 1 VIEW COMPARISON:  Portable chest yesterday at 4:22 p.m., portable chest 01/18/2022. FINDINGS: 6:25 a.m., 02/08/2022. NGT has been inserted the tip in the distal gastric antrum. Right IJ port catheter tip remains in distal SVC. 8 cm rounded left perihilar opacity is again noted and could be a rounded pneumonia less likely  large mass given that this was not seen on May 12. There is additional patchy hazy increased opacity in the left lower lung field most likely due to pneumonia, with right upper lobe dense irregular mass approaching 4 cm again shown. Other scattered small nodules in the lung fields on the chest CT of 12/31/2021 are not well seen radiographically. There is a small left pleural layering effusion. No right effusion is seen or pneumothorax. Remaining lungs clear. The cardiac size is normal. IMPRESSION: 1. 8 cm left perihilar opacity. Probable rounded pneumonia, less likely mass. 2. Patchy hazy opacities in the left lower lung field most likely due to pneumonia. Small left effusion. 3. Stable right upper lobe irregular mass. 4. Support devices as above. Electronically Signed   By: Telford Nab M.D.   On: 02/08/2022 06:48   DG Chest Port 1 View  Addendum Date: 03/05/2022   ADDENDUM REPORT: 02/24/2022 17:00 ADDENDUM: On CT abdomen performed 15 minutes after this radiograph, there is note of free air within the abdomen. In retrospect, there is a small sliver of lucency on this frontal radiograph deep to the right hemidiaphragm that may represent the free air better seen on today's same day CT. Electronically Signed   By: Yvonne Kendall M.D.   On: 02/08/2022 17:00   Result Date:  02/20/2022 CLINICAL DATA:  Questionable sepsis. Evaluate for abnormality. Former smoker. EXAM: PORTABLE CHEST 1 VIEW COMPARISON:  AP chest 01/18/2022 chest two views 12/31/2021, CT chest 12/31/2021, AP chest 04/04/2021 FINDINGS: Right chest wall porta catheter tip again overlies the central superior vena cava. Cardiac silhouette and mediastinal contours are within normal limits. Right upper lobe mass is unchanged from 01/18/2022 most recent chest radiographs but likely slightly decreased from 04/04/2021. There is a new left perihilar density measuring approximately 8 cm in craniocaudal dimension, not seen on recent 01/18/2022 radiographs and  therefore possibly representing an acute findings such as pneumonia. A smaller left lower lung nodular density appears grossly unchanged from prior radiographs and CT. No pleural effusion or pneumothorax. IMPRESSION: Stable known right upper lobe mass. Note is made on recent chest CT of multiple new pulmonary nodules concerning for pulmonary metastatic disease. New large left perihilar density compared to recent 01/18/2022 radiographs only 20 days ago. Given the acute rapid change, this may represent pneumonia however remains concerning for metastatic disease. Electronically Signed: By: Yvonne Kendall M.D. On: 02/27/2022 16:36   DG Chest Port 1 View  Result Date: 01/18/2022 CLINICAL DATA:  Weakness. EXAM: PORTABLE CHEST 1 VIEW COMPARISON:  December 31, 2021. FINDINGS: The heart size and mediastinal contours are within normal limits. Right internal jugular Port-A-Cath is unchanged in position. Left lung is clear. Stable right upper lobe mass is noted. The visualized skeletal structures are unremarkable. IMPRESSION: Stable right upper lobe mass is noted.  Left lung is clear. Electronically Signed   By: Marijo Conception M.D.   On: 01/18/2022 11:35   [place] EKG SITE RITE  Result Date: 02/08/2022 If Site Rite image not attached, placement could not be confirmed due to current cardiac rhythm.   No problem-specific Assessment & Plan notes found for this encounter.  #61 year old female patient with a history of metastatic lung cancer to the brain/non-small cell-currently admitted to hospital for perforated diverticulitis/sepsis.  #Metastatic adenocarcinoma of the lung-most recently on first-line Alimta-Keytruda maintenance.  Patient noted to have progression of disease on recent imaging.  In the process of consideration of second line therapy.   #Metastatic lung cancer to the brain-s/p repeat SRS approximate 2 weeks ago.  #Poorly controlled blood sugars-secondary to steroids.  #Perforated sigmoid  diverticulitis-s/p left hemicolectomy.  #Thrombocytopenia-moderate 70s to 75s; likely sepsis/bone marrow recovery from chemotherapy.  Plan/recommendations:  #Agree with current management of urgent surgical resection for complicated diverticulitis.   #Consider stress dose of steroids if continued hypotension as patient has been on on and off for the last month or so.  #With regards to metastatic lung cancer-unfortunately prognosis is quite poor especially in the context of current complicated medical course.  I had a long discussion with the patient's daughter [name] regarding difficulty in considering any further chemotherapy anytime soon.   #Recommend palliative care evaluation regarding addressing CODE STATUS.  Discussed with [employer].   Thank you Dr.Alsekrov for allowing me to participate in the care of your pleasant patient. Please do not hesitate to contact me with questions or concerns in the interim.  Discussed with Dr.Aleskerov.   All questions were answered. The patient knows to call the clinic with any problems, questions or concerns.    Cammie Sickle, MD 02/08/2022 10:37 PM

## 2022-02-08 NOTE — Anesthesia Postprocedure Evaluation (Signed)
Anesthesia Post Note  Patient: Nichole Park  Procedure(s) Performed: EXPLORATORY LAPAROTOMY COLECTOMY WITH COLOSTOMY CREATION/HARTMANN PROCEDURE OPEN LEFT SALPINGECTOMY (Left: Abdomen) EXAM UNDER ANESTHESIA  Patient location during evaluation: ICU Anesthesia Type: General Level of consciousness: awake and alert Pain management: pain level controlled Vital Signs Assessment: post-procedure vital signs reviewed and stable Respiratory status: spontaneous breathing, nonlabored ventilation, respiratory function stable and patient connected to nasal cannula oxygen Cardiovascular status: blood pressure returned to baseline and stable Postop Assessment: no apparent nausea or vomiting Anesthetic complications: no   No notable events documented.   Last Vitals:  Vitals:   02/08/22 0600 02/08/22 0615  BP: (!) 77/63 (!) 85/69  Pulse: 88 84  Resp: 16 15  Temp:    SpO2: (!) 89% 94%    Last Pain:  Vitals:   02/08/22 0400  TempSrc: Axillary  PainSc: 0-No pain                 Alison Stalling

## 2022-02-08 NOTE — TOC Initial Note (Signed)
Transition of Care [facility]) - Initial/Assessment Note    Patient Details  Name: Nichole Park MRN: [id number] Date of Birth: 11/21/1960  [facility] CM/SW Contact:    Shelbie Hutching, RN Phone Number: 02/08/2022, 12:12 PM  Clinical Narrative:                 Patient admitted to the hospital with septic shock from perforated diverticulum, S/P  ex lap with LLQ colostomy.   Currently patient is in the ICU on HFNC at [place] 80%.  Patient is requiring vasopressors to maintain blood pressure.   Patient has a history of stage 4 lung cancer with mets to brain. Palliative is to meet with patient and daughter.  TOC will follow.  Expected Discharge Plan:  (TBD) Barriers to Discharge: Continued Medical Work up   Patient Goals and CMS Choice        Expected Discharge Plan and Services Expected Discharge Plan:  (TBD)   Discharge Planning Services: CM Consult   Living arrangements for the past 2 months: Mobile Home                                      Prior Living Arrangements/Services Living arrangements for the past 2 months: Mobile Home Lives with:: Self Patient language and need for interpreter reviewed:: Yes        Need for Family Participation in Patient Care: Yes (Comment) Care giver support system in place?: Yes (comment) (daughter) Current home services: DME (walker, wheelchair) Criminal Activity/Legal Involvement Pertinent to Current Situation/Hospitalization: No - Comment as needed  Activities of Daily Living Home Assistive Devices/Equipment: Eyeglasses, Environmental consultant (specify type) ADL Screening (condition at time of admission) Patient's cognitive ability adequate to safely complete daily activities?: No Is the patient deaf or have difficulty hearing?: No Does the patient have difficulty seeing, even when wearing glasses/contacts?: No Does the patient have difficulty concentrating, remembering, or making decisions?: No Patient able to express need for  assistance with ADLs?: Yes Does the patient have difficulty dressing or bathing?: Yes Independently performs ADLs?: No Communication: Independent Dressing (OT): Needs assistance Is this a change from baseline?: Change from baseline, expected to last <3days Grooming: Needs assistance Is this a change from baseline?: Change from baseline, expected to last <3 days Feeding: Independent Bathing: Needs assistance Is this a change from baseline?: Change from baseline, expected to last <3 days Toileting: Needs assistance Is this a change from baseline?: Change from baseline, expected to last <3 days In/Out Bed: Needs assistance Is this a change from baseline?: Change from baseline, expected to last <3 days Walks in Home: Needs assistance Is this a change from baseline?: Change from baseline, expected to last <3 days Does the patient have difficulty walking or climbing stairs?: Yes Weakness of Legs: Both Weakness of Arms/Hands: Both  Permission Sought/Granted                  Emotional Assessment Appearance:: Appears older than stated age Attitude/Demeanor/Rapport: Lethargic Affect (typically observed): Unable to Assess Orientation: : Oriented to Self, Oriented to Place, Oriented to Situation Alcohol / Substance Use: Not Applicable Psych Involvement: No (comment)  Admission diagnosis:  Weakness [R53.1] Perforated diverticulum of large intestine [K57.20] Septic shock ([place]) [A41.9, R65.21] Patient Active Problem List   Diagnosis Date Noted   Septic shock ([place]) 02/14/2022   Electrolyte abnormality 01/19/2022   Hyperglycemia 01/18/2022   Hypothyroidism 01/01/2022  Paroxysmal atrial fibrillation ([place]) 01/01/2022   Lung cancer metastatic to brain ([place]) 01/01/2022   Metastatic lung cancer (metastasis from lung to other site) [facility]) 12/31/2021   Cancer of upper lobe of right lung ([place]) 04/10/2021   Lung mass    Palliative care encounter    PAF (paroxysmal atrial fibrillation) ([place])  03/30/2021   COPD (chronic obstructive pulmonary disease) ([place]) 03/30/2021   Hyperthyroidism 03/29/2021   Malignant pericardial effusion    Tobacco use    Cardiac tamponade 03/27/2021   Cardiac/pericardial tamponade 03/27/2021   Hypertension 03/22/2014   PCP:  [place]:   [facility] [place], [place] [address] [place] [address] Phone: [phone] Fax: [phone]     Social Determinants of Health (SDOH) Interventions    Readmission Risk Interventions    02/08/2022   12:09 PM  Readmission Risk Prevention Plan  Transportation Screening Complete  PCP or Specialist Appt within 3-5 Days Complete  HRI or [place] Complete  Social Work Consult for [place] Planning/Counseling Complete  Palliative Care Screening Complete  Medication Review Press photographer) Complete

## 2022-02-08 NOTE — Progress Notes (Signed)
  Interdisciplinary Goals of Care Family Meeting   Date carried out: 02/08/2022  Location of the meeting: Conference room  Member's involved: Family Member and ICU Nurse Practitioner   Durable Power of Attorney or acting medical decision maker: [name], Daughter     Discussion: We discussed goals of care for Nichole Park .  Discussed decline in pt condition and code status   Code status: Full Code  Disposition: Continue current acute care  Time spent for the meeting: 20 minutes     Donell Beers, [place] Pager [phone] (please enter 7 digits) PCCM Consult Pager [phone] (please enter 7 digits)

## 2022-02-08 NOTE — Consult Note (Signed)
[place] at [facility] [phone]) [phone] [phone]   Name: Nichole Park Date: 02/08/2022 MRN: [id number]  DOB: [birth year]  Patient Care Team: Logan as PCP - General End, Harrell Gave, MD as PCP - Cardiology (Cardiology) Telford Nab, RN as Oncology Nurse Navigator Cammie Sickle, MD as Consulting Physician (Oncology)    REASON FOR CONSULTATION: Nichole Park is a 61 y.o. 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  Patient was hospitalized 12/31/2021 to 01/03/2022 with right-sided hemiparesis secondary to worsening brain metastases.  Patient was referred to XRT and improved on steroids.  Patient has received Alimta with Keytruda maintenance but recent CT of the chest, abdomen, and pelvis revealed overall disease progression with interval increase in size of large spiculated right upper lobe lung mass and interval development of multiple bilateral lung nodules and left axillary adenopathy.  Plan had been to rotate to second line chemotherapy.  Patient was seen at the [facility] on 03/01/2022 with worsening nausea, vomiting, and abdominal pain.  She was sent to the emergency department with CT revealing diffuse pneumoperitoneum suggestive of a perforated diverticulum.  Patient underwent emergent surgery.  Palliative care was consulted to help address goals.  SOCIAL HISTORY:     reports that she has quit smoking. Her smoking use included cigarettes. She has a 40.00 pack-year smoking history. She has never used smokeless tobacco. She reports that she does not currently use drugs after having used the following drugs: Marijuana. She reports that she does not drink alcohol.  Patient is married lives at home with her husband.  She has a  daughter, [name], who is her primary caregiver.  Patient has another daughter and son who are less involved.  ADVANCE DIRECTIVES:  Not on file  CODE STATUS: Limited code  PAST MEDICAL HISTORY: Past Medical History:  Diagnosis Date   Graves disease    Malignant pericardial effusion    Paroxysmal atrial fibrillation ([place])    Primary lung adenocarcinoma ([place])     PAST SURGICAL HISTORY:  Past Surgical History:  Procedure Laterality Date   COLECTOMY WITH COLOSTOMY CREATION/HARTMANN PROCEDURE N/A 03/06/2022   Procedure: COLECTOMY WITH COLOSTOMY CREATION/HARTMANN PROCEDURE;  Surgeon: Benjamine Sprague, DO;  Location: [facility] ORS;  Service: General;  Laterality: N/A;   IR CV LINE INJECTION  06/19/2021   IR IMAGING GUIDED PORT INSERTION  04/27/2021   LAPAROTOMY N/A 02/13/2022   Procedure: EXPLORATORY LAPAROTOMY;  Surgeon: Benjamine Sprague, DO;  Location: [facility] ORS;  Service: General;  Laterality: N/A;   PERICARDIOCENTESIS N/A 03/27/2021   Procedure: PERICARDIOCENTESIS;  Surgeon: Nelva Bush, MD;  Location: [facility];  Service: Cardiovascular;  Laterality: N/A;   TUBAL LIGATION     UNILATERAL SALPINGECTOMY Left 02/09/2022   Procedure: OPEN LEFT SALPINGECTOMY;  Surgeon: Benjamine Sprague, DO;  Location: [facility] ORS;  Service: General;  Laterality: Left;    HEMATOLOGY/ONCOLOGY HISTORY:  Oncology History Overview Note  # AUG 2022-stage IV adenocarcinoma of the lung [s/p pericardial effusion/tamponade]. MRI Brain AUG 16th, 2022- IMPRESSION: Multiple peripherally enhancing, centrally T1 hypointense lesions, with surrounding T2 hyperintense signal in the bilateral cerebral hemispheres, concerning for metastatic disease.    # AUG 17th 2022- CARBO-ALIMTA-Keytruda #1 x4 followed by Alimta Keytruda maintenance ; May 2023-CT scan chest and pelvis progression of disease  # Mammogram- FEB 2023-no lesions noted in the  breasts; 1 to 2 cm lymph node left underarm;. LYMPH NODE, LEFT AXILLARY; ULTRASOUND-GUIDED  CORE NEEDLE BIOPSY:  - POSITIVE FOR MALIGNANCY.  - METASTATIC ADENOCARCINOMA, COMPATIBLE WITH PULMONARY PRIMARY   # Repeat MRI brain- 11/22 Mildly increased size of a left parietal metastasis with increased, mild edema. Decreased size of 2 other metastases. S/p SRS- finished on DEC 23rd, 2022- MRI FEB 2023-improved brain lesions;    #April 2023-worsening brain metastases s/p SBRT [facility], 2023]  # JAN 2023-iatrogenic hypothyroidism- TSH 83; synthroid 166mcg  #Acute respiratory failure BiPAP/ICU.  S/p pericardiocentesis.  NGS; PDL-1-0    Malignant pericardial effusion  03/28/2021 Initial Diagnosis   Malignant pericardial effusion ([place])    04/25/2021 - 12/28/2021 Chemotherapy   Patient is on Treatment Plan : LUNG CARBOplatin / Pemetrexed / Pembrolizumab q21d Induction x 4 cycles / Maintenance Pemetrexed + Pembrolizumab      02/14/2022 -  Chemotherapy   Patient is on Treatment Plan : LUNG Docetaxel + Ramucirumab q21d       Cancer of upper lobe of right lung ([place])  04/10/2021 Initial Diagnosis   Cancer of upper lobe of right lung ([place])    04/10/2021 Cancer Staging   Staging form: Lung, AJCC 8th Edition - Clinical: Stage IVA (cT2, cN2, cM1a) - Signed by Cammie Sickle, MD on 04/10/2021    02/14/2022 -  Chemotherapy   Patient is on Treatment Plan : LUNG Docetaxel + Ramucirumab q21d         ALLERGIES:  is allergic to diazepam, keytruda [pembrolizumab], metoprolol, and tapazole [methimazole].  MEDICATIONS:  Current Facility-Administered Medications  Medication Dose Route Frequency Provider Last Rate Last Admin   0.9 %  sodium chloride infusion  250 mL Intravenous Continuous Teressa Lower, NP   Stopped at 02/08/22 0858   acetaminophen (OFIRMEV) IV 1,000 mg  1,000 mg Intravenous Q6H Teressa Lower, NP   Stopped at 02/08/22 1227   albumin human 25 % solution 12.5 g  12.5 g Intravenous Q6H Teressa Lower, NP 60 mL/hr at 02/08/22 1443 12.5 g at 02/08/22 1443   budesonide  (PULMICORT) nebulizer solution 0.5 mg  0.5 mg Nebulization BID Teressa Lower, NP   0.5 mg at 02/08/22 5427   celecoxib (CELEBREX) capsule 200 mg  200 mg Oral BID Sakai, Isami, DO       Chlorhexidine Gluconate Cloth 2 % PADS 6 each  6 each Topical Daily Teressa Lower, NP   6 each at 03/02/75 2831   folic acid (FOLVITE) tablet 1 mg  1 mg Oral Daily Sakai, Isami, DO       HYDROmorphone (DILAUDID) injection 0.5 mg  0.5 mg Intravenous Q3H PRN Sakai, Isami, DO       insulin aspart (novoLOG) injection 0-15 Units  0-15 Units Subcutaneous Q4H Teressa Lower, NP   3 Units at 02/08/22 5176   ipratropium-albuterol (DUONEB) 0.5-2.5 (3) MG/3ML nebulizer solution 3 mL  3 mL Nebulization Q6H Teressa Lower, NP   3 mL at 02/08/22 1350   ipratropium-albuterol (DUONEB) 0.5-2.5 (3) MG/3ML nebulizer solution 3 mL  3 mL Nebulization Q6H PRN Teressa Lower, NP   3 mL at 02/24/2022 2245   lactated ringers infusion   Intravenous Continuous Sakai, Isami, DO   Stopped at 02/08/22 0118   MEDLINE mouth rinse  15 mL Mouth Rinse BID Teressa Lower, NP   15 mL at 02/08/22 0945   methylPREDNISolone sodium succinate (SOLU-MEDROL) 40 mg/mL injection 20 mg  20 mg Intravenous  Q12H Sakai, Isami, DO   20 mg at 02/08/22 1212   metroNIDAZOLE (FLAGYL) IVPB 500 mg  500 mg Intravenous Q12H Ottie Glazier, MD   Stopped at 02/08/22 1311   ondansetron (ZOFRAN-ODT) disintegrating tablet 4 mg  4 mg Oral Q6H PRN Lysle Pearl, Isami, DO       Or   ondansetron [facility]) injection 4 mg  4 mg Intravenous Q6H PRN Sakai, Isami, DO       oxyCODONE (Oxy IR/ROXICODONE) immediate release tablet 5-10 mg  5-10 mg Oral Q4H PRN Sakai, Isami, DO       pantoprazole (PROTONIX) injection 40 mg  40 mg Intravenous QHS Sakai, Isami, DO   40 mg at 02/08/22 0025   phenylephrine CONCENTRATED 100mg  in sodium chloride 0.9% 271mL (0.4mg /mL) premix infusion  0-400 mcg/min Intravenous Titrated Teressa Lower, NP 15 mL/hr at 02/08/22 1400 100 mcg/min at 02/08/22 1400   [START ON  02/09/2022] piperacillin-tazobactam (ZOSYN) IVPB 3.375 g  3.375 g Intravenous Q8H Ottie Glazier, MD       sodium bicarbonate 150 mEq in sterile water 1,150 mL infusion   Intravenous Continuous Teressa Lower, NP 75 mL/hr at 02/08/22 1400 Infusion Verify at 02/08/22 1400   sodium chloride flush (NS) 0.9 % injection 10-40 mL  10-40 mL Intracatheter Q12H Sakai, Isami, DO   10 mL at 02/08/22 1212   sodium chloride flush (NS) 0.9 % injection 10-40 mL  10-40 mL Intracatheter PRN Lysle Pearl, Isami, DO       traMADol (ULTRAM) tablet 50 mg  50 mg Oral Q6H PRN Sakai, Isami, DO        VITAL SIGNS: BP 94/69   Pulse 88   Temp 97.8 F (36.6 C) (Axillary)   Resp 20   Ht 5\' 1"  (1.549 m)   Wt 130 lb 4.7 oz (59.1 kg)   SpO2 94%   BMI 24.62 kg/m  Filed Weights   02/16/2022 1536 03/04/2022 2314  Weight: 130 lb (59 kg) 130 lb 4.7 oz (59.1 kg)    Estimated body mass index is 24.62 kg/m as calculated from the following:   Height as of this encounter: 5\' 1"  (1.549 m).   Weight as of this encounter: 130 lb 4.7 oz (59.1 kg).  LABS: CBC:    Component Value Date/Time   WBC 9.2 02/08/2022 0410   HGB 11.6 (L) 02/08/2022 0410   HGB 15.9 12/01/2013 1440   HCT 34.2 (L) 02/08/2022 0410   HCT 46.8 12/01/2013 1440   PLT 74 (L) 02/08/2022 0410   PLT 236 12/01/2013 1440   MCV 94.5 02/08/2022 0410   MCV 87 12/01/2013 1440   NEUTROABS 7.1 03/07/2022 2326   LYMPHSABS 1.0 02/14/2022 2326   MONOABS 0.5 03/06/2022 2326   EOSABS 0.0 03/01/2022 2326   BASOSABS 0.0 03/06/2022 2326   Comprehensive Metabolic Panel:    Component Value Date/Time   NA 139 02/08/2022 1021   NA 137 12/01/2013 1440   K 3.5 02/08/2022 1021   K 3.7 12/01/2013 1440   CL 103 02/08/2022 1021   CL 108 (H) 12/01/2013 1440   CO2 26 02/08/2022 1021   CO2 25 12/01/2013 1440   BUN 21 (H) 02/08/2022 1021   BUN 8 12/01/2013 1440   CREATININE 0.38 (L) 02/08/2022 1021   CREATININE 0.43 (L) 12/01/2013 1440   GLUCOSE 162 (H) 02/08/2022 1021   GLUCOSE  117 (H) 12/01/2013 1440   CALCIUM 8.0 (L) 02/08/2022 1021   CALCIUM 8.8 12/01/2013 1440   AST 40 02/08/2022 0410  AST 35 12/01/2013 1440   ALT 40 02/08/2022 0410   ALT 36 12/01/2013 1440   ALKPHOS 50 02/08/2022 0410   ALKPHOS 327 (H) 12/01/2013 1440   BILITOT 0.8 02/08/2022 0410   BILITOT 0.4 12/01/2013 1440   PROT 4.0 (L) 02/08/2022 0410   PROT 7.8 12/01/2013 1440   ALBUMIN 1.9 (L) 02/08/2022 0410   ALBUMIN 3.7 12/01/2013 1440    RADIOGRAPHIC STUDIES: DG Abd 1 View  Result Date: 02/27/2022 CLINICAL DATA:  NG tube placement. EXAM: ABDOMEN - 1 VIEW COMPARISON:  CT earlier today FINDINGS: Tip and side port of the enteric tube below the diaphragm in the stomach. There is excreted IV contrast within both renal collecting systems from CT earlier today. Surgical drain in the left abdomen. IMPRESSION: Tip and side port of the enteric tube below the diaphragm in the stomach. Electronically Signed   By: Keith Rake M.D.   On: 02/09/2022 23:49   CT Head Wo Contrast  Result Date: 02/18/2022 CLINICAL DATA:  Confusion, anorexia, severe abdominal pain, swelling in hands and feet, shortness of breath, history lung cancer EXAM: CT HEAD WITHOUT CONTRAST TECHNIQUE: Contiguous axial images were obtained from the base of the skull through the vertex without intravenous contrast. RADIATION DOSE REDUCTION: This exam was performed according to the departmental dose-optimization program which includes automated exposure control, adjustment of the mA and/or kV according to patient size and/or use of iterative reconstruction technique. COMPARISON:  01/18/2022 CT head, 01/19/2022 MR head FINDINGS: Brain: Normal ventricular morphology. No midline shift or mass effect. Again identified area of low attenuation LEFT parietal white matter, 3.5 cm diameter, consistent with vasogenic edema, patient with known metastatic lesion by prior MR. No intracranial hemorrhage, additional mass lesion or evidence of acute infarction.  No extra-axial fluid collections. Vascular: Minimal atherosclerotic calcification of internal carotid arteries at skull base Skull: Intact Sinuses/Orbits: Clear Other: N/A IMPRESSION: LEFT parietal white matter vasogenic edema due to underlying known metastatic lesion as demonstrated by prior MR. No new intracranial abnormalities. Electronically Signed   By: Lavonia Dana M.D.   On: 02/14/2022 16:59   CT HEAD WO CONTRAST (5MM)  Result Date: 01/18/2022 CLINICAL DATA:  History of metastatic non-small cell lung cancer. EXAM: CT HEAD WITHOUT CONTRAST TECHNIQUE: Contiguous axial images were obtained from the base of the skull through the vertex without intravenous contrast. RADIATION DOSE REDUCTION: This exam was performed according to the departmental dose-optimization program which includes automated exposure control, adjustment of the mA and/or kV according to patient size and/or use of iterative reconstruction technique. COMPARISON:  Head CT and MRI brain 12/31/2021. FINDINGS: Brain: Overall definite interval improvement in the amount of tumoral edema seen in the left parietal white matter. The actual metastatic focus is difficult to identify this study without contrast and is better seen on the MRI. No new areas of edema to suggest other metastatic lesions. No evidence of acute hemispheric infarction or intracranial hemorrhage. The brainstem and cerebellum are grossly and stable. Vascular: Minimal vascular calcifications are stable. No hyperdense vessels. Skull: No skull lesions. Sinuses/Orbits: The paranasal sinuses and mastoid air cells are clear. The globes are intact. Other: No scalp lesions or scalp hematoma. IMPRESSION: 1. Overall definite interval improvement in the amount of tumoral edema in the left parietal white matter. The actual metastatic focus is difficult to identify this study without contrast and is better seen on the MRI. 2. No new areas of edema to suggest other metastatic lesions.  Electronically Signed   By: Mamie Nick.  Gallerani M.D.   On: 01/18/2022 14:30   MR BRAIN W WO CONTRAST  Result Date: 01/20/2022 CLINICAL DATA:  Initial evaluation for brain CNS neoplasm. EXAM: MRI HEAD WITHOUT AND WITH CONTRAST TECHNIQUE: Multiplanar, multiecho pulse sequences of the brain and surrounding structures were obtained without and with intravenous contrast. CONTRAST:  66m GADAVIST GADOBUTROL 1 MMOL/ML IV SOLN COMPARISON:  Prior MRI from 12/31/2021. FINDINGS: Brain: Previously identified intracranial metastatic deposit positioned at the left parietal lobe again seen. Overall, lesion is not significantly changed in overall size measuring 1.7 x 1.7 x 1.9 cm. However, the lesion demonstrates diminished peripheral enhancement as compared to previous exam. Additionally, associated vasogenic edema within the adjacent left parietal region is also improved. Improved mass effect on the left lateral ventricle without significant midline shift. Overall, changes are consistent with interval response to therapy. No new lesions identified. No acute or subacute infarct. Gray-white matter differentiation otherwise maintained. Underlying mild chronic microvascular ischemic disease noted. No acute intracranial hemorrhage. Small focus of chronic hemosiderin staining at the left frontal lobe noted, stable. No other new acute or chronic intracranial blood products. No other mass lesion or mass effect. No hydrocephalus or extra-axial fluid collection. Pituitary gland suprasellar region normal. Midline structures intact and normal. No other abnormal enhancement. Vascular: Major intracranial vascular flow voids are maintained. Skull and upper cervical spine: Craniocervical junction with normal limits. Bone marrow signal intensity normal. No focal marrow replacing lesion. No scalp soft tissue abnormality. Sinuses/Orbits: Globes and orbital soft tissues within normal limits. Mild scattered mucosal thickening noted about the sphenoid  ethmoidal and maxillary sinuses. Small bilateral mastoid effusions noted. Other: None. IMPRESSION: 1. Similar size of left parietal metastatic lesion, but with decreased enhancement and associated vasogenic edema, consistent with interval response to therapy. Secondary improved mass effect within the left parietal region as compared to previous. No new lesions identified. 2. No other new or acute intracranial abnormality. Electronically Signed   By: BJeannine BogaM.D.   On: 01/20/2022 01:50   CT ABDOMEN PELVIS W CONTRAST  Result Date: 02/18/2022 CLINICAL DATA:  Abdominal pain, lower extremity swelling, short of breath worsening over last 3 days, history of lung cancer EXAM: CT ABDOMEN AND PELVIS WITH CONTRAST TECHNIQUE: Multidetector CT imaging of the abdomen and pelvis was performed using the standard protocol following bolus administration of intravenous contrast. RADIATION DOSE REDUCTION: This exam was performed according to the departmental dose-optimization program which includes automated exposure control, adjustment of the mA and/or kV according to patient size and/or use of iterative reconstruction technique. CONTRAST:  1025mOMNIPAQUE IOHEXOL 300 MG/ML  SOLN COMPARISON:  12/31/2021, 10/01/2021, 03/06/2022 FINDINGS: Lower chest: Dense left lower lobe consolidation favor pneumonia given rapid development since prior exams. Nodularity within the right middle and right lower lobe consistent with known metastatic lung cancer. Necrotic appearing 1 cm lymph node anterior to the aorta in the left infrahilar region unchanged. Hepatobiliary: Hepatic steatosis. No focal liver abnormality. Gallbladder is unremarkable. No biliary duct dilation. Pancreas: Unremarkable. No pancreatic ductal dilatation or surrounding inflammatory changes. Spleen: Normal in size without focal abnormality. Adrenals/Urinary Tract: Stable appearance of the kidneys. No urinary tract calculi or obstruction. The adrenals and bladder are  unremarkable. Stomach/Bowel: There is distal colonic diverticulosis, with wall thickening and fat stranding of the sigmoid colon consistent with acute diverticulitis. There is free fluid in free gas adjacent to the inflamed sigmoid colon consistent with perforation. Small multilocular gas and fluid collection along the left pelvic sidewall measures up to 2.1 x 2.9  cm, without rim enhancement to suggest abscess at this time. No bowel obstruction or ileus. Mild wall thickening of the small bowel within the lower central abdomen adjacent to the perforated sigmoid colon likely secondary inflammation. Vascular/Lymphatic: Stable aortic atherosclerosis. No pathologic adenopathy. Reproductive: Uterus and bilateral adnexa are unremarkable. Other: Pneumoperitoneum from perforated sigmoid diverticulitis as above. Small amount of free fluid within the lower abdomen, with localized gas/fluid collection adjacent to the sigmoid diverticulitis as described above. No evidence of abdominal wall hernia. Musculoskeletal: No acute or destructive bony lesions. No acute or destructive bony scotch that reconstructed images demonstrate no additional findings. IMPRESSION: 1. Acute perforated sigmoid diverticulitis as above. Diffuse pneumoperitoneum, with small gas/fluid collection at the site of diverticulitis. No rim enhancement to suggest abscess at this time. 2. Left lower lobe consolidation, favor pneumonia. Please see previous chest x-ray discussion. 3. Stable findings of a static lung cancer, with mediastinal adenopathy and right lower lobe nodularity unchanged. Please see recent chest CT 12/31/2021 full description. 4. Hepatic steatosis. 5.  Aortic Atherosclerosis (ICD10-I70.0). Critical Value/emergent results were called by telephone at the time of interpretation on 02/13/2022 at 5:05 pm to provider [facility] , who verbally acknowledged these results. Electronically Signed   By: Randa Ngo M.D.   On: 02/24/2022 17:05   DG  Chest Port 1 View  Result Date: 02/08/2022 CLINICAL DATA:  Respiratory failure, right upper lobe lung cancer. EXAM: PORTABLE CHEST 1 VIEW COMPARISON:  Portable chest yesterday at 4:22 p.m., portable chest 01/18/2022. FINDINGS: 6:25 a.m., 02/08/2022. NGT has been inserted the tip in the distal gastric antrum. Right IJ port catheter tip remains in distal SVC. 8 cm rounded left perihilar opacity is again noted and could be a rounded pneumonia less likely large mass given that this was not seen on May 12. There is additional patchy hazy increased opacity in the left lower lung field most likely due to pneumonia, with right upper lobe dense irregular mass approaching 4 cm again shown. Other scattered small nodules in the lung fields on the chest CT of 12/31/2021 are not well seen radiographically. There is a small left pleural layering effusion. No right effusion is seen or pneumothorax. Remaining lungs clear. The cardiac size is normal. IMPRESSION: 1. 8 cm left perihilar opacity. Probable rounded pneumonia, less likely mass. 2. Patchy hazy opacities in the left lower lung field most likely due to pneumonia. Small left effusion. 3. Stable right upper lobe irregular mass. 4. Support devices as above. Electronically Signed   By: Telford Nab M.D.   On: 02/08/2022 06:48   DG Chest Port 1 View  Addendum Date: 02/16/2022   ADDENDUM REPORT: 02/19/2022 17:00 ADDENDUM: On CT abdomen performed 15 minutes after this radiograph, there is note of free air within the abdomen. In retrospect, there is a small sliver of lucency on this frontal radiograph deep to the right hemidiaphragm that may represent the free air better seen on today's same day CT. Electronically Signed   By: Yvonne Kendall M.D.   On: 02/23/2022 17:00   Result Date: 02/15/2022 CLINICAL DATA:  Questionable sepsis. Evaluate for abnormality. Former smoker. EXAM: PORTABLE CHEST 1 VIEW COMPARISON:  AP chest 01/18/2022 chest two views 12/31/2021, CT chest  12/31/2021, AP chest 04/04/2021 FINDINGS: Right chest wall porta catheter tip again overlies the central superior vena cava. Cardiac silhouette and mediastinal contours are within normal limits. Right upper lobe mass is unchanged from 01/18/2022 most recent chest radiographs but likely slightly decreased from 04/04/2021. There is a  new left perihilar density measuring approximately 8 cm in craniocaudal dimension, not seen on recent 01/18/2022 radiographs and therefore possibly representing an acute findings such as pneumonia. A smaller left lower lung nodular density appears grossly unchanged from prior radiographs and CT. No pleural effusion or pneumothorax. IMPRESSION: Stable known right upper lobe mass. Note is made on recent chest CT of multiple new pulmonary nodules concerning for pulmonary metastatic disease. New large left perihilar density compared to recent 01/18/2022 radiographs only 20 days ago. Given the acute rapid change, this may represent pneumonia however remains concerning for metastatic disease. Electronically Signed: By: Yvonne Kendall M.D. On: 02/13/2022 16:36   DG Chest Port 1 View  Result Date: 01/18/2022 CLINICAL DATA:  Weakness. EXAM: PORTABLE CHEST 1 VIEW COMPARISON:  December 31, 2021. FINDINGS: The heart size and mediastinal contours are within normal limits. Right internal jugular Port-A-Cath is unchanged in position. Left lung is clear. Stable right upper lobe mass is noted. The visualized skeletal structures are unremarkable. IMPRESSION: Stable right upper lobe mass is noted.  Left lung is clear. Electronically Signed   By: Marijo Conception M.D.   On: 01/18/2022 11:35   [place] EKG SITE RITE  Result Date: 02/08/2022 If Site Rite image not attached, placement could not be confirmed due to current cardiac rhythm.   PERFORMANCE STATUS (ECOG) : 4 - Bedbound  Review of Systems Unable to provide  Physical Exam General: Ill-appearing Cardiovascular: regular rate and rhythm Pulmonary:  Unlabored Abdomen: Surgical incision and colostomy noted GU: Foley Extremities: edema, no joint deformities Skin: no rashes Neurological: Wakes when stimulated  IMPRESSION: Patient remains in ICU on high flow O2 at 50% FiO2.  She also remains on pressors, although these have been weaned some throughout the day.  I met privately with patient's daughter, [name], at her request.  Daughter verbalized understanding that patient remains critically ill with guarded prognosis.  However, she states repeatedly that she wants to maintain hope that patient will recover from this illness.  We discussed the progressive nature of the cancer and that further treatments would be on hold for now.  Daughter says that patient never returned to her previous baseline following the most recent hospitalization.  Care has been challenging at home as patient was requiring moderate to maximum assistance with ADLs.  In the event of clinical improvement, daughter is hopeful that patient would be able to transition to rehab and possible LTC.  Daughter says that she has previously discussed CODE STATUS with the patient.  She says that patient would not want CPR due to the possible painful nature of broken ribs.  However, patient would want all other resuscitative efforts including short-term intubation.  I explained the probable futility associated with aggressive measures at end-of-life in light of the aggressive and terminal nature of the cancer.  She verbalized understanding.  PLAN: -Continue current scope of treatment -Limited code (no CPR but all other resuscitative measures) -Will follow  Case and plan discussed with Dr. Rogue Bussing   Time Total: 70 minutes  Visit consisted of counseling and education dealing with the complex and emotionally intense issues of symptom management and palliative care in the setting of serious and potentially life-threatening illness.Greater than 50%  of this time was spent counseling  and coordinating care related to the above assessment and plan.  Signed by: Altha Harm, PhD, NP-C

## 2022-02-09 ENCOUNTER — Inpatient Hospital Stay: Payer: Medicaid Other

## 2022-02-09 LAB — BASIC METABOLIC PANEL
Anion gap: 9 (ref 5–15)
Anion gap: 9 (ref 5–15)
Anion gap: 3 — ABNORMAL LOW (ref 5–15)
BUN: 15 mg/dL (ref 6–20)
BUN: 10 mg/dL (ref 6–20)
BUN: 7 mg/dL (ref 6–20)
CO2: 27 mmol/L (ref 22–32)
CO2: 29 mmol/L (ref 22–32)
CO2: 27 mmol/L (ref 22–32)
Calcium: 7.9 mg/dL — ABNORMAL LOW (ref 8.9–10.3)
Calcium: 7.9 mg/dL — ABNORMAL LOW (ref 8.9–10.3)
Calcium: 7.5 mg/dL — ABNORMAL LOW (ref 8.9–10.3)
Chloride: 99 mmol/L (ref 98–111)
Chloride: 99 mmol/L (ref 98–111)
Chloride: 104 mmol/L (ref 98–111)
Creatinine, Ser: 0.34 mg/dL — ABNORMAL LOW (ref 0.44–1.00)
Creatinine, Ser: 0.33 mg/dL — ABNORMAL LOW (ref 0.44–1.00)
Creatinine, Ser: 0.33 mg/dL — ABNORMAL LOW (ref 0.44–1.00)
GFR, Estimated: >60 mL/min (ref >60)
GFR, Estimated: >60 mL/min (ref >60)
GFR, Estimated: >60 mL/min (ref >60)
Glucose, Bld: 102 mg/dL — ABNORMAL HIGH (ref 70–99)
Glucose, Bld: 96 mg/dL (ref 70–99)
Glucose, Bld: 123 mg/dL — ABNORMAL HIGH (ref 70–99)
Potassium: 2.3 mmol/L — CL (ref 3.5–5.1)
Potassium: 2.3 mmol/L — CL (ref 3.5–5.1)
Potassium: 7.1 mmol/L (ref 3.5–5.1)
Sodium: 135 mmol/L (ref 135–145)
Sodium: 137 mmol/L (ref 135–145)
Sodium: 134 mmol/L — ABNORMAL LOW (ref 135–145)

## 2022-02-09 LAB — GLUCOSE, CAPILLARY
Glucose-Capillary: 102 mg/dL — ABNORMAL HIGH (ref 70–99)
Glucose-Capillary: 107 mg/dL — ABNORMAL HIGH (ref 70–99)
Glucose-Capillary: 107 mg/dL — ABNORMAL HIGH (ref 70–99)
Glucose-Capillary: 123 mg/dL — ABNORMAL HIGH (ref 70–99)
Glucose-Capillary: 137 mg/dL — ABNORMAL HIGH (ref 70–99)
Glucose-Capillary: 93 mg/dL (ref 70–99)

## 2022-02-09 LAB — HEMOGLOBIN AND HEMATOCRIT, BLOOD
HCT: 22.7 % — ABNORMAL LOW (ref 36.0–46.0)
HCT: 23.7 % — ABNORMAL LOW (ref 36.0–46.0)
HCT: 23.6 % — ABNORMAL LOW (ref 36.0–46.0)
Hemoglobin: 7.6 g/dL — ABNORMAL LOW (ref 12.0–15.0)
Hemoglobin: 8 g/dL — ABNORMAL LOW (ref 12.0–15.0)
Hemoglobin: 8 g/dL — ABNORMAL LOW (ref 12.0–15.0)

## 2022-02-09 LAB — PHOSPHORUS
Phosphorus: 2.3 mg/dL — ABNORMAL LOW (ref 2.5–4.6)
Phosphorus: 2.3 mg/dL — ABNORMAL LOW (ref 2.5–4.6)
Phosphorus: 2.5 mg/dL (ref 2.5–4.6)

## 2022-02-09 LAB — CBC
HCT: 24.7 % — ABNORMAL LOW (ref 36.0–46.0)
HCT: 22.9 % — ABNORMAL LOW (ref 36.0–46.0)
Hemoglobin: 8.4 g/dL — ABNORMAL LOW (ref 12.0–15.0)
Hemoglobin: 7.8 g/dL — ABNORMAL LOW (ref 12.0–15.0)
MCH: 32.2 pg (ref 26.0–34.0)
MCH: 32 pg (ref 26.0–34.0)
MCHC: 34 g/dL (ref 30.0–36.0)
MCHC: 34.1 g/dL (ref 30.0–36.0)
MCV: 94.6 fL (ref 80.0–100.0)
MCV: 93.9 fL (ref 80.0–100.0)
Platelets: 35 10*3/uL — ABNORMAL LOW (ref 150–400)
Platelets: 32 10*3/uL — ABNORMAL LOW (ref 150–400)
RBC: 2.61 MIL/uL — ABNORMAL LOW (ref 3.87–5.11)
RBC: 2.44 MIL/uL — ABNORMAL LOW (ref 3.87–5.11)
RDW: 13.8 % (ref 11.5–15.5)
RDW: 13.6 % (ref 11.5–15.5)
WBC: 7.6 10*3/uL (ref 4.0–10.5)
WBC: 7.3 10*3/uL (ref 4.0–10.5)
nRBC: 0.9 % — ABNORMAL HIGH (ref 0.0–0.2)
nRBC: 0.8 % — ABNORMAL HIGH (ref 0.0–0.2)

## 2022-02-09 LAB — POTASSIUM: Potassium: 4.1 mmol/L (ref 3.5–5.1)

## 2022-02-09 LAB — LACTIC ACID, PLASMA: Lactic Acid, Venous: 0.9 mmol/L (ref 0.5–1.9)

## 2022-02-09 LAB — D-DIMER, QUANTITATIVE: D-Dimer, Quant: 2.65 ug/mL-FEU — ABNORMAL HIGH (ref 0.00–0.50)

## 2022-02-09 LAB — PROTIME-INR
INR: 1.3 — ABNORMAL HIGH (ref 0.8–1.2)
Prothrombin Time: 16.3 seconds — ABNORMAL HIGH (ref 11.4–15.2)

## 2022-02-09 LAB — FIBRINOGEN: Fibrinogen: 364 mg/dL (ref 210–475)

## 2022-02-09 LAB — MAGNESIUM
Magnesium: 2.2 mg/dL (ref 1.7–2.4)
Magnesium: 2.2 mg/dL (ref 1.7–2.4)

## 2022-02-09 LAB — ALBUMIN: Albumin: 2.6 g/dL — ABNORMAL LOW (ref 3.5–5.0)

## 2022-02-09 LAB — PROCALCITONIN: Procalcitonin: 3.37 ng/mL

## 2022-02-09 MED ORDER — MAGNESIUM SULFATE 2 GM/50ML IV SOLN
2.0000 g | Freq: Once | INTRAVENOUS | Status: AC
  Administered 2022-02-09: 2 g via INTRAVENOUS
  Filled 2022-02-09: qty 50

## 2022-02-09 MED ORDER — DEXTROSE 5 % IV SOLN
15.0000 mmol | Freq: Once | INTRAVENOUS | Status: AC
Start: 2022-02-09 — End: 2022-02-09
  Administered 2022-02-09: 15 mmol via INTRAVENOUS
  Filled 2022-02-09: qty 5

## 2022-02-09 MED ORDER — POTASSIUM CHLORIDE 10 MEQ/100ML IV SOLN
10.0000 meq | INTRAVENOUS | Status: DC
  Filled 2022-02-09 (×7): qty 100

## 2022-02-09 MED ORDER — POTASSIUM CHLORIDE 10 MEQ/50ML IV SOLN
10.0000 meq | INTRAVENOUS | Status: AC
  Administered 2022-02-09 (×6): 10 meq via INTRAVENOUS
  Filled 2022-02-09 (×6): qty 50

## 2022-02-09 NOTE — Progress Notes (Addendum)
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 Pharmacy is asked to follow and replace electrolytes while in CCU  MIVF: LR at 75 cc/hr  Goal of Therapy:  Electrolytes within normal limits  Plan:  --No further supplementation currently required --Will recheck electrolytes with AM labs  Pearla Dubonnet 02/09/2022 2:37 PM

## 2022-02-09 NOTE — Progress Notes (Signed)
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 Pharmacy is asked to follow and replace electrolytes while in CCU  MIVF: LR at 75 cc/hr  Goal of Therapy:  Electrolytes within normal limits  Plan:  --K 2.3, IV Kcl 10 mEq x 6 runs + 22.5 mEq from IV potassium phosphate --Phos 2.3, IV potassium phosphate 15 mmol x 1 --Re-check labs today at 1400 and again tomorrow AM  Benita Gutter 02/09/2022 7:44 AM

## 2022-02-09 NOTE — Progress Notes (Signed)
NAME:  Nichole Park, MRN:  [id number], DOB:  August 07, 1961, LOS: 2 ADMISSION DATE:  02/26/2022, CONSULTATION DATE: 02/17/2022 REFERRING MD: Dr. Lysle Pearl  CHIEF COMPLAINT:  Hypotension   History of Present Illness:  This is a 61 yo female with a PMH of Stage IV Adenocarcinoma (most recent oncology outpatient progress note on 01/31/22 concern of possible progression of disease despite alimta keytruda maintenance therapy.  Therefore, maintenance therapy discontinued and discussed subsequent line therapy with understanding treatment would be palliative not curative.    She presented to [facility] ER on 06/1 from oncology clinic with a 3 day hx of worsening weakness, bilateral lower extremity weakness, abdominal pain, and confusion.  She also endorsed worsening shortness of breath.  Lab results revealed K+ 127, chloride 95, CO2 18, glucose 429, BUN 27, LDH 311, lactic acid 6.2, wbc 12.0, platelets 95, and fibrinogen 705.  CT Abd/Pelvis revealed perforated sigmoid diverticulitis with diffuse pneumoperitoneum with small gas/fluid collection at the site of diverticulitis and LLL pneumonia.  ER physician consulted General Surgeon Dr. Lysle Pearl and pt transported to OR for exploratory laparotomy/Hartman's procedure.  Pt also started on insulin gtt due to DKA.  During exploratory laparotomy, hartmans procedure pt had bleeding in the left adnexa requiring open left salpingectomy performed by OBGYN Dr. Leafy Ro.  Postop pt became hypotensive requiring neo-synephrine gtt PCCM consulted to assist with management.    02/08/22- Met with daughter and reviewed in [place] and had [place].  02/09/22- patient weaned on Neo from 200 to 25, mentation is improved, H/h trending down and patient is agreable to transfusion. Reviewed plan with daughter. She wants intubation but not CPR limited code status. Contiuing full scope of therapy. Short interval improvement today.   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 Leafy Ro.  Postop developed hypotension requiring neo-synephrine gtt PCCM consulted to assist with management 06/1: CT Abd Pelvis revealed acute perforated sigmoid         diverticulitis as above. Diffuse pneumoperitoneum, with small        gas/fluid collection at the site of diverticulitis. No rim        enhancement to suggest abscess at this time. Left lower lobe        consolidation, favor pneumonia. Please see previous chest x-ray        discussion. Stable findings of a static lung cancer, with        mediastinal adenopathy and right lower lobe nodularity        unchanged. Please see recent chest CT 12/31/2021 full        description. Hepatic steatosis. Aortic Atherosclerosis       (ICD10-I70.0). 06/1: CT Head revealed LEFT parietal white matter vasogenic        edema due to underlying known metastatic lesion as       demonstrated by prior MR. No new intracranial abnormalities.  Micro Data:  Aerobic/Anaerobic 06/1>> MRSA PCR 06/1>> COVID-19 by PCR 06/1>>negative  Blood x2 06/1>>  Antimicrobials:  Cefepime 06/1>> Vancomycin 06/1>> Flagyl 06/1>>  Interim History / Subjective:  Pt with increased work of breathing and wheezing throughout.  Pt also hypotensive sbp 70's despite iv fluid resuscitation, therefore neo-synephrine gtt initiated   Objective   Blood pressure 115/78, pulse 95,  temperature 98.5 F (36.9 C), temperature source Oral, resp. rate 19, height 5' 1"  (1.549 m), weight 59.1 kg, SpO2 96 %.    FiO2 (%):  [55 %-70 %] 60 %   Intake/Output Summary (Last 24 hours) at 02/09/2022 1316 Last data filed at 02/09/2022 1033 Gross per 24 hour  Intake 2472.25 ml  Output 1358 ml  Net 1114.25 ml    Filed Weights   02/10/2022 1536 02/16/2022 2314  Weight: 59 kg 59.1 kg   Examination: General: Acute on chronically ill appearing female, on nasal canula  HENT: Supple, no JVD present  Lungs: Faint wheezes throughout, tachypneic with use of accessory muscles to breath  Cardiovascular: NSR, rrr, no r/g, 2+ radial/1+ distal pulses, generalized edema present  Abdomen: no audible BS present, tenderness present     Extremities: Moves all extremities  Neuro: Alert ,oriented, follows commands, PERRLA GU: Indwelling foley catheter draining clear yellow urine   CHEST IMAGING     Assessment & Plan:   Acute on chronic hypoxic respiratory failure secondary to LLL pneumonia, metabolic acidosis in the setting of DKA, and stage IV adenocarcinoma of the lung COPD  - Supplemental O2 for dyspnea and/or hypoxia  - Scheduled and prn bronchodilator therapy  - Nebulized steroids  - Continue abx as outlined above  - Aggressive pulmonary hygiene   Septic shock  -present on addimission  -secondary to perforated sigmoid diverticulitis with diffuse pneumoperitoneum and LLL pneumonia s/p exploratory laparotomy/Hartman's found to have bleeding in the left adnexa requiring open left salpingectomy~006/25/2023 - Continuous telemetry monitoring  - Aggressive iv fluid resuscitation and prn neo-synephrine gtt to maintain map >65 - Continue abx therapy as outlined above - General surgery consulted appreciate input: dressing changes to midline abdominal incisions per recommendations  - NGT to LIS   Metabolic acidosis and lactic acidosis in setting of sepsis and DKA  Hyponatremia secondary to volume  depletion  - Trend BMP, lactic acid, and VBG - Replace electrolytes as indicated  - Monitor UOP - Avoid nephrotoxic medications  - 2 amps of sodium bicarb   Diabetic ketoacidosis  - Will recheck BMP if ruled in for DKA will restart insulin gtt and DKA protocol   Chronic thrombocytopenia  - Trend CBC  - Monitor for s/sx of bleeding - Transfuse for platelet count <50 or signs of bleeding and/or hgb <7 - Avoid chemical VTE px   Postop pain  - Prn oxycodone, tramadol, and dilaudid for pain management   Best Practice (right click and "Reselect all SmartList Selections" daily)   Diet/type: NPO DVT prophylaxis: SCD GI prophylaxis: PPI Lines: N/A Foley:  Yes, and it is still needed Code Status:  full code Last date of multidisciplinary goals of care discussion [N/A]  Will place palliative care consult to discuss goals of treatment and code status  Labs   CBC: Recent Labs  Lab 02/24/2022 1347 02/20/2022 1610 03/05/2022 2326 02/08/22 0410 02/09/22 0415 02/09/22 0457 02/09/22 1121  WBC 12.0* 9.0 9.3 9.2 7.6 7.3  --   NEUTROABS 9.3* 6.9 7.1  --   --   --   --   HGB 14.6 14.7 13.1 11.6* 8.4* 7.8* 7.6*  HCT 41.3 43.6 39.8 34.2* 24.7* 22.9* 22.7*  MCV 92.8  94.6 96.6 94.5 94.6 93.9  --   PLT 95* 94* 76* 74* 35* 32*  --      Basic Metabolic Panel: Recent Labs  Lab 02/13/2022 2326 02/08/22 0410 02/08/22 1021 02/09/22 0415 02/09/22 0457  NA 134* 138 139 135 137  K 3.7 3.0* 3.5 2.3* 2.3*  CL 109 108 103 99 99  CO2 16* 26 26 27 29   GLUCOSE 192* 206* 162* 102* 96  BUN 26* 24* 21* 15 10  CREATININE 0.61 0.43* 0.38* 0.34* 0.33*  CALCIUM 8.0* 7.6* 8.0* 7.9* 7.9*  MG  --  1.4* 2.7* 2.2 2.2  PHOS  --  2.8  --  2.3* 2.3*    GFR: Estimated Creatinine Clearance: 63.3 mL/min (A) (by C-G formula based on SCr of 0.33 mg/dL (L)). Recent Labs  Lab 02/20/2022 2326 03/06/2022 2345 02/08/22 0158 02/08/22 0410 02/08/22 1021 02/09/22 0415 02/09/22 0457  PROCALCITON 2.74  --   --  3.99   --  3.37  --   WBC 9.3  --   --  9.2  --  7.6 7.3  LATICACIDVEN  --    < > 4.6* 2.3* 2.9* 0.9  --    < > = values in this interval not displayed.     Liver Function Tests: Recent Labs  Lab 02/10/2022 1610 03/01/2022 2326 02/08/22 0410 02/09/22 0415  AST 56* 55* 40  --   ALT 61* 47* 40  --   ALKPHOS 122 78 50  --   BILITOT 1.0 0.7 0.8  --   PROT 6.0* 4.3* 4.0*  --   ALBUMIN 2.1* <1.5* 1.9* 2.6*    No results for input(s): LIPASE, AMYLASE in the last 168 hours. No results for input(s): AMMONIA in the last 168 hours.  ABG    Component Value Date/Time   PHART 7.27 (L) 02/09/2022 2235   PCO2ART 28 (L) 02/08/2022 2235   PO2ART 81 (L) 03/04/2022 2235   HCO3 27.2 02/08/2022 0410   O2SAT 76.2 02/08/2022 0410      Coagulation Profile: Recent Labs  Lab 03/05/2022 1347 02/09/22 0550  INR 1.1 1.3*     Cardiac Enzymes: No results for input(s): CKTOTAL, CKMB, CKMBINDEX, TROPONINI in the last 168 hours.  HbA1C: Hgb A1c MFr Bld  Date/Time Value Ref Range Status  01/20/2022 05:00 PM 7.3 (H) 4.8 - 5.6 % Final    Comment:    (NOTE) Pre diabetes:          5.7%-6.4%  Diabetes:              >6.4%  Glycemic control for   <7.0% adults with diabetes   01/18/2022 08:03 PM 7.1 (H) 4.8 - 5.6 % Final    Comment:    (NOTE) Pre diabetes:          5.7%-6.4%  Diabetes:              >6.4%  Glycemic control for   <7.0% adults with diabetes     CBG: Recent Labs  Lab 02/08/22 1953 02/08/22 2319 02/09/22 0357 02/09/22 0737 02/09/22 1136  GLUCAP 152* 101* 102* 107* 137*     Review of Systems: Positives in BOLD   Gen: Denies fever, chills, weight change, fatigue, night sweats HEENT: Denies blurred vision, double vision, hearing loss, tinnitus, sinus congestion, rhinorrhea, sore throat, neck stiffness, dysphagia PULM: Denies shortness of breath, cough, sputum production, hemoptysis, wheezing CV: Denies chest pain, edema, orthopnea, paroxysmal nocturnal dyspnea,  palpitations GI: abdominal pain, nausea, vomiting, diarrhea,  hematochezia, melena, constipation, change in bowel habits GU: Denies dysuria, hematuria, polyuria, oliguria, urethral discharge Endocrine: Denies hot or cold intolerance, polyuria, polyphagia or appetite change Derm: Denies rash, dry skin, scaling or peeling skin change Heme: Denies easy bruising, bleeding, bleeding gums Neuro: Denies headache, numbness, weakness, slurred speech, loss of memory or consciousness   Past Medical History:  She,  has a past medical history of Graves disease, Malignant pericardial effusion, Paroxysmal atrial fibrillation ([place]), and Primary lung adenocarcinoma ([place]).   Surgical History:   Past Surgical History:  Procedure Laterality Date   COLECTOMY WITH COLOSTOMY CREATION/HARTMANN PROCEDURE N/A 02/27/2022   Procedure: COLECTOMY WITH COLOSTOMY CREATION/HARTMANN PROCEDURE;  Surgeon: Benjamine Sprague, DO;  Location: [facility] ORS;  Service: General;  Laterality: N/A;   IR CV LINE INJECTION  06/19/2021   IR IMAGING GUIDED PORT INSERTION  04/27/2021   LAPAROTOMY N/A 02/24/2022   Procedure: EXPLORATORY LAPAROTOMY;  Surgeon: Benjamine Sprague, DO;  Location: [facility] ORS;  Service: General;  Laterality: N/A;   PERICARDIOCENTESIS N/A 03/27/2021   Procedure: PERICARDIOCENTESIS;  Surgeon: Nelva Bush, MD;  Location: [place] [facility];  Service: Cardiovascular;  Laterality: N/A;   TUBAL LIGATION     UNILATERAL SALPINGECTOMY Left 02/26/2022   Procedure: OPEN LEFT SALPINGECTOMY;  Surgeon: Benjamine Sprague, DO;  Location: [facility] ORS;  Service: General;  Laterality: Left;     Social History:   reports that she has quit smoking. Her smoking use included cigarettes. She has a 40.00 pack-year smoking history. She has never used smokeless tobacco. She reports that she does not currently use drugs after having used the following drugs: Marijuana. She reports that she does not drink alcohol.   Family History:  Her family history includes  Peripheral Artery Disease in her mother. There is no history of Breast cancer.   Allergies Allergies  Allergen Reactions   Diazepam Other (See Comments)    Very high emotionally and very low emotionally, hyperventilate and passes out Very high emotionally and very low emotionally, hyperventilate and passes out    Keytruda [Pembrolizumab] Rash   Metoprolol    Tapazole [Methimazole]      Home Medications  Prior to Admission medications   Medication Sig Start Date End Date Taking? Authorizing Provider  bismuth subsalicylate (PEPTO BISMOL) 262 MG/15ML suspension Take 30 mLs by mouth every 6 (six) hours as needed. Patient not taking: Reported on 02/10/2022    [provider]  dexamethasone (DECADRON) 2 MG tablet Take 1 tablet (2 mg total) by mouth 2 (two) times daily. 01/21/22 02/20/22  Enzo Bi, MD  diltiazem (CARDIZEM CD) 180 MG 24 hr capsule Take 1 capsule (180 mg total) by mouth daily. 11/28/21 02/26/22  Rise Mu, PA-C  folic acid (FOLVITE) 1 MG tablet Take 1 tablet (1 mg total) by mouth once daily. 10/15/21   Cammie Sickle, MD  insulin glargine (LANTUS) 100 UNIT/ML Solostar Pen Inject 10 Units into the skin at bedtime. 01/21/22 04/21/22  Enzo Bi, MD  Insulin Pen Needle 31G X 6 MM MISC 1 Units by Does not apply route at bedtime. 01/21/22   Enzo Bi, MD  metFORMIN (GLUCOPHAGE) 500 MG tablet Take 2 tablets (1,000 mg total) by mouth 2 (two) times daily with a meal. 01/31/22   Cammie Sickle, MD  Multiple Vitamin (MULTI-VITAMINS) TABS Take 1 tablet by mouth daily. Patient not taking: Reported on 02/23/2022    [provider]  ondansetron (ZOFRAN) 4 MG tablet Take 2 tablets (27m total) by mouth once every 8  hours as needed for nausea and vomiting. 04/16/21   Cammie Sickle, MD  TURMERIC PO Take 1 tablet by mouth daily. Patient not taking: Reported on 03/02/2022    [provider]     Critical care provider statement:   Total critical care time: 33  minutes   Performed by: Lanney Gins MD   Critical care time was exclusive of separately billable procedures and treating other patients.   Critical care was necessary to treat or prevent imminent or life-threatening deterioration.   Critical care was time spent personally by me on the following activities: development of treatment plan with patient and/or surrogate as well as nursing, discussions with consultants, evaluation of patient's response to treatment, examination of patient, obtaining history from patient or surrogate, ordering and performing treatments and interventions, ordering and review of laboratory studies, ordering and review of radiographic studies, pulse oximetry and re-evaluation of patient's condition.    Ottie Glazier, M.D.  Pulmonary & Critical Care Medicine

## 2022-02-09 NOTE — Progress Notes (Signed)
Subjective:  CC: Nichole Park is a 61 y.o. female  Hospital stay day 2, 2 Days Post-Op Hartman's for perforated diverticulitis  HPI: Transferred to ICU post op due to septic shock.  Abdominal pain reported. No flatus or stool in bag.  Cooperating with respiratory therapist, weaning high flow oxygen currently.  Appears bright and alert and in no distress.   Objective:   Temp:  [97.7 F (36.5 C)-99.2 F (37.3 C)] 98.5 F (36.9 C) (06/03 1142) Pulse Rate:  [74-102] 97 (06/03 1530) Resp:  [14-25] 18 (06/03 1530) BP: (78-123)/(54-84) 103/74 (06/03 1530) SpO2:  [87 %-99 %] 90 % (06/03 1530) FiO2 (%):  [55 %-65 %] 55 % (06/03 1530)     Height: 5\' 1"  (154.9 cm) Weight: 59.1 kg BMI (Calculated): 24.63   Intake/Output this shift:   Intake/Output Summary (Last 24 hours) at 02/09/2022 1611 Last data filed at 02/09/2022 1509 Gross per 24 hour  Intake 2996.81 ml  Output 1783 ml  Net 1213.81 ml     Constitutional :  alert, cooperative, appears stated age, and mild distress  Respiratory:  Wheezing noted.  On high flow [place]  Cardiovascular:  regular rate and rhythm  Gastrointestinal: Soft, no guarding but focal TTP ostomy swollen, beefy red, no active bleeding . NG with bilious output.  No stomal output, gas or stool.  Skin: Cool and moist. Incision site dressing intact  Psychiatric: Normal affect, non-agitated, not confused       LABS:     Latest Ref Rng & Units 02/09/2022    2:15 PM 02/09/2022    1:25 PM 02/09/2022    4:57 AM  CMP  Glucose 70 - 99 mg/dL  123   96    BUN 6 - 20 mg/dL  7   10    Creatinine 0.44 - 1.00 mg/dL  0.33   0.33    Sodium 135 - 145 mmol/L  134   137    Potassium 3.5 - 5.1 mmol/L 4.1   7.1   2.3    Chloride 98 - 111 mmol/L  104   99    CO2 22 - 32 mmol/L  27   29    Calcium 8.9 - 10.3 mg/dL  7.5   7.9        Latest Ref Rng & Units 02/09/2022   11:21 AM 02/09/2022    4:57 AM 02/09/2022    4:15 AM  CBC  WBC 4.0 - 10.5 K/uL  7.3   7.6    Hemoglobin 12.0 - 15.0 g/dL 7.6    7.8   8.4    Hematocrit 36.0 - 46.0 % 22.7   22.9   24.7    Platelets 150 - 400 K/uL  32   35      RADS: N/a Assessment:   S/p hartmans' for perforated diverticulitis.  Continue supportive care per ICU team.  Prognosis still guarded due to persistent need for pressors and fragile respiratory status.  Palliative consult already in place.    labs/images/medications/previous chart entries reviewed personally and relevant changes/updates noted above.

## 2022-02-09 NOTE — Progress Notes (Signed)
CRITICAL VALUE STICKER  CRITICAL VALUE: Potassium 7.1  RECEIVER (on-site recipient of call):  Freda Jackson RN  DATE & TIME NOTIFIED: 02/09/22 @ 32  MD NOTIFIED: Ottie Glazier MD and Donell Beers NP  TIME OF NOTIFICATION: 02/09/22 @1410   RESPONSE: Recheck

## 2022-02-09 NOTE — Progress Notes (Signed)
Pt appears more sob and fine crackles noted at bases. LR stopped. Domingo Pulse NP notified and instructed to discontinue LR as of now. Read back and verified.

## 2022-02-10 ENCOUNTER — Inpatient Hospital Stay: Payer: Medicaid Other

## 2022-02-10 LAB — CBC WITH DIFFERENTIAL/PLATELET
Abs Immature Granulocytes: 0.49 10*3/uL — ABNORMAL HIGH (ref 0.00–0.07)
Basophils Absolute: 0 10*3/uL (ref 0.0–0.1)
Basophils Relative: 1 %
Eosinophils Absolute: 0 10*3/uL (ref 0.0–0.5)
Eosinophils Relative: 0 %
HCT: 24.1 % — ABNORMAL LOW (ref 36.0–46.0)
Hemoglobin: 7.9 g/dL — ABNORMAL LOW (ref 12.0–15.0)
Immature Granulocytes: 8 %
Lymphocytes Relative: 8 %
Lymphs Abs: 0.5 10*3/uL — ABNORMAL LOW (ref 0.7–4.0)
MCH: 31.2 pg (ref 26.0–34.0)
MCHC: 32.8 g/dL (ref 30.0–36.0)
MCV: 95.3 fL (ref 80.0–100.0)
Monocytes Absolute: 0.3 10*3/uL (ref 0.1–1.0)
Monocytes Relative: 5 %
Neutro Abs: 4.6 10*3/uL (ref 1.7–7.7)
Neutrophils Relative %: 78 %
Platelets: 30 10*3/uL — ABNORMAL LOW (ref 150–400)
RBC: 2.53 MIL/uL — ABNORMAL LOW (ref 3.87–5.11)
RDW: 14.1 % (ref 11.5–15.5)
Smear Review: DECREASED
WBC: 5.8 10*3/uL (ref 4.0–10.5)
nRBC: 1 % — ABNORMAL HIGH (ref 0.0–0.2)

## 2022-02-10 LAB — BASIC METABOLIC PANEL
Anion gap: 8 (ref 5–15)
Anion gap: 19 — ABNORMAL HIGH (ref 5–15)
BUN: 8 mg/dL (ref 6–20)
BUN: 10 mg/dL (ref 6–20)
CO2: 29 mmol/L (ref 22–32)
CO2: 21 mmol/L — ABNORMAL LOW (ref 22–32)
Calcium: 7.6 mg/dL — ABNORMAL LOW (ref 8.9–10.3)
Calcium: 7.1 mg/dL — ABNORMAL LOW (ref 8.9–10.3)
Chloride: 102 mmol/L (ref 98–111)
Chloride: 103 mmol/L (ref 98–111)
Creatinine, Ser: 0.39 mg/dL — ABNORMAL LOW (ref 0.44–1.00)
Creatinine, Ser: 0.8 mg/dL (ref 0.44–1.00)
GFR, Estimated: >60 mL/min (ref >60)
GFR, Estimated: >60 mL/min (ref >60)
Glucose, Bld: 107 mg/dL — ABNORMAL HIGH (ref 70–99)
Glucose, Bld: 58 mg/dL — ABNORMAL LOW (ref 70–99)
Potassium: 3.3 mmol/L — ABNORMAL LOW (ref 3.5–5.1)
Potassium: 6.7 mmol/L (ref 3.5–5.1)
Sodium: 139 mmol/L (ref 135–145)
Sodium: 143 mmol/L (ref 135–145)

## 2022-02-10 LAB — CBC
HCT: 19.6 % — ABNORMAL LOW (ref 36.0–46.0)
Hemoglobin: 6 g/dL — ABNORMAL LOW (ref 12.0–15.0)
MCH: 31.6 pg (ref 26.0–34.0)
MCHC: 30.6 g/dL (ref 30.0–36.0)
MCV: 103.2 fL — ABNORMAL HIGH (ref 80.0–100.0)
Platelets: 30 10*3/uL — ABNORMAL LOW (ref 150–400)
RBC: 1.9 MIL/uL — ABNORMAL LOW (ref 3.87–5.11)
RDW: 14.9 % (ref 11.5–15.5)
WBC: 14 10*3/uL — ABNORMAL HIGH (ref 4.0–10.5)
nRBC: 4.2 % — ABNORMAL HIGH (ref 0.0–0.2)

## 2022-02-10 LAB — URINALYSIS, COMPLETE (UACMP) WITH MICROSCOPIC
Bilirubin Urine: NEGATIVE
Glucose, UA: NEGATIVE mg/dL
Ketones, ur: 5 mg/dL — AB
Leukocytes,Ua: NEGATIVE
Nitrite: NEGATIVE
Protein, ur: NEGATIVE mg/dL
Specific Gravity, Urine: 1.011 (ref 1.005–1.030)
pH: 5 (ref 5.0–8.0)

## 2022-02-10 LAB — GLUCOSE, CAPILLARY
Glucose-Capillary: 100 mg/dL — ABNORMAL HIGH (ref 70–99)
Glucose-Capillary: 113 mg/dL — ABNORMAL HIGH (ref 70–99)
Glucose-Capillary: 120 mg/dL — ABNORMAL HIGH (ref 70–99)
Glucose-Capillary: 136 mg/dL — ABNORMAL HIGH (ref 70–99)
Glucose-Capillary: 151 mg/dL — ABNORMAL HIGH (ref 70–99)
Glucose-Capillary: 274 mg/dL — ABNORMAL HIGH (ref 70–99)

## 2022-02-10 LAB — CULTURE, BLOOD (ROUTINE X 2)

## 2022-02-10 LAB — PHOSPHORUS
Phosphorus: 2.1 mg/dL — ABNORMAL LOW (ref 2.5–4.6)
Phosphorus: 2.6 mg/dL (ref 2.5–4.6)
Phosphorus: 7.2 mg/dL — ABNORMAL HIGH (ref 2.5–4.6)

## 2022-02-10 LAB — MAGNESIUM
Magnesium: 2.7 mg/dL — ABNORMAL HIGH (ref 1.7–2.4)
Magnesium: 2.7 mg/dL — ABNORMAL HIGH (ref 1.7–2.4)

## 2022-02-10 LAB — TROPONIN I (HIGH SENSITIVITY): Troponin I (High Sensitivity): 347 ng/L (ref <18)

## 2022-02-10 LAB — ALBUMIN: Albumin: 2.4 g/dL — ABNORMAL LOW (ref 3.5–5.0)

## 2022-02-10 LAB — LACTIC ACID, PLASMA: Lactic Acid, Venous: >9 mmol/L (ref 0.5–1.9)

## 2022-02-10 LAB — POTASSIUM: Potassium: 4.1 mmol/L (ref 3.5–5.1)

## 2022-02-10 MED ORDER — FENTANYL CITRATE PF 50 MCG/ML IJ SOSY
50.0000 ug | PREFILLED_SYRINGE | Freq: Once | INTRAMUSCULAR | Status: AC
  Administered 2022-02-10: 50 ug via INTRAVENOUS
  Filled 2022-02-10: qty 1

## 2022-02-10 MED ORDER — MIDAZOLAM HCL 2 MG/2ML IJ SOLN: 2.0000 mg | INTRAMUSCULAR | Status: DC | PRN

## 2022-02-10 MED ORDER — NOREPINEPHRINE 16 MG/250ML-% IV SOLN
0.0000 ug/min | INTRAVENOUS | Status: DC
  Administered 2022-02-11: 30 ug/min via INTRAVENOUS
  Administered 2022-02-11: 10 ug/min via INTRAVENOUS
  Filled 2022-02-10: qty 250

## 2022-02-10 MED ORDER — TRAMADOL HCL 50 MG PO TABS: 50.0000 mg | ORAL_TABLET | Freq: Four times a day (QID) | ORAL | Status: DC | PRN

## 2022-02-10 MED ORDER — MIDAZOLAM HCL 2 MG/2ML IJ SOLN
4.0000 mg | Freq: Once | INTRAMUSCULAR | Status: AC
  Administered 2022-02-10: 4 mg via INTRAVENOUS
  Filled 2022-02-10: qty 4

## 2022-02-10 MED ORDER — FOLIC ACID 1 MG PO TABS: 1.0000 mg | ORAL_TABLET | Freq: Every day | ORAL | Status: DC

## 2022-02-10 MED ORDER — DEXTROSE 5 % IV SOLN
15.0000 mmol | Freq: Once | INTRAVENOUS | Status: AC
  Administered 2022-02-10: 15 mmol via INTRAVENOUS
  Filled 2022-02-10: qty 5

## 2022-02-10 MED ORDER — FUROSEMIDE 10 MG/ML IJ SOLN
20.0000 mg | Freq: Once | INTRAMUSCULAR | Status: AC
  Administered 2022-02-10: 20 mg via INTRAVENOUS
  Filled 2022-02-10: qty 2

## 2022-02-10 MED ORDER — POTASSIUM CHLORIDE 10 MEQ/50ML IV SOLN
10.0000 meq | INTRAVENOUS | Status: AC
  Administered 2022-02-10 (×4): 10 meq via INTRAVENOUS
  Filled 2022-02-10 (×4): qty 50

## 2022-02-10 MED ORDER — VASOPRESSIN 20 UNITS/100 ML INFUSION FOR SHOCK
0.0000 [IU]/min | INTRAVENOUS | Status: DC
  Administered 2022-02-10: 0.03 [IU]/min via INTRAVENOUS
  Filled 2022-02-10: qty 100

## 2022-02-10 MED ORDER — INSULIN ASPART 100 UNIT/ML IV SOLN
10.0000 [IU] | Freq: Once | INTRAVENOUS | Status: AC
  Administered 2022-02-10: 10 [IU] via INTRAVENOUS
  Filled 2022-02-10: qty 0.1

## 2022-02-10 MED ORDER — PANTOPRAZOLE 2 MG/ML SUSPENSION
40.0000 mg | Freq: Every day | ORAL | Status: DC
  Administered 2022-02-10: 40 mg
  Filled 2022-02-10: qty 20

## 2022-02-10 MED ORDER — LACTATED RINGERS IV BOLUS
500.0000 mL | Freq: Once | INTRAVENOUS | Status: AC
  Administered 2022-02-10: 500 mL via INTRAVENOUS

## 2022-02-10 MED ORDER — FENTANYL CITRATE (PF) 100 MCG/2ML IJ SOLN: 100.0000 ug | Freq: Once | INTRAMUSCULAR | Status: DC

## 2022-02-10 MED ORDER — FENTANYL CITRATE (PF) 100 MCG/2ML IJ SOLN
INTRAMUSCULAR | Status: AC
  Filled 2022-02-10: qty 2

## 2022-02-10 MED ORDER — LACTATED RINGERS IV SOLN: INTRAVENOUS | Status: DC

## 2022-02-10 MED ORDER — FENTANYL 2500MCG IN NS 250ML (10MCG/ML) PREMIX INFUSION
50.0000 ug/h | INTRAVENOUS | Status: DC
  Administered 2022-02-10: 50 ug/h via INTRAVENOUS
  Filled 2022-02-10: qty 250

## 2022-02-10 MED ORDER — MIDAZOLAM HCL 2 MG/2ML IJ SOLN
INTRAMUSCULAR | Status: AC
  Filled 2022-02-10: qty 4

## 2022-02-10 MED ORDER — SODIUM ZIRCONIUM CYCLOSILICATE 5 G PO PACK
10.0000 g | PACK | Freq: Two times a day (BID) | ORAL | Status: DC
  Administered 2022-02-10: 10 g via ORAL
  Filled 2022-02-10: qty 2

## 2022-02-10 MED ORDER — ORAL CARE MOUTH RINSE: 15.0000 mL | Freq: Two times a day (BID) | OROMUCOSAL | Status: DC

## 2022-02-10 MED ORDER — SODIUM BICARBONATE 8.4 % IV SOLN
50.0000 meq | Freq: Once | INTRAVENOUS | Status: AC
  Administered 2022-02-10: 50 meq via INTRAVENOUS

## 2022-02-10 MED ORDER — DOCUSATE SODIUM 50 MG/5ML PO LIQD
100.0000 mg | Freq: Two times a day (BID) | ORAL | Status: DC
  Administered 2022-02-10: 100 mg
  Filled 2022-02-10: qty 10

## 2022-02-10 MED ORDER — NOREPINEPHRINE 4 MG/250ML-% IV SOLN
0.0000 ug/min | INTRAVENOUS | Status: DC
  Administered 2022-02-10: 15 ug/min via INTRAVENOUS
  Administered 2022-02-10: 40 ug/min via INTRAVENOUS
  Filled 2022-02-10: qty 250

## 2022-02-10 MED ORDER — MIDAZOLAM HCL 2 MG/2ML IJ SOLN: 4.0000 mg | Freq: Once | INTRAMUSCULAR | Status: DC

## 2022-02-10 MED ORDER — POLYETHYLENE GLYCOL 3350 17 G PO PACK
17.0000 g | PACK | Freq: Every day | ORAL | Status: DC
  Administered 2022-02-10: 17 g
  Filled 2022-02-10: qty 1

## 2022-02-10 MED ORDER — SODIUM BICARBONATE 8.4 % IV SOLN
INTRAVENOUS | Status: AC
  Administered 2022-02-10: 50 meq
  Filled 2022-02-10: qty 50

## 2022-02-10 MED ORDER — ORAL CARE MOUTH RINSE
15.0000 mL | OROMUCOSAL | Status: DC
  Administered 2022-02-10 – 2022-02-11 (×2): 15 mL via OROMUCOSAL

## 2022-02-10 MED ORDER — MORPHINE SULFATE (PF) 2 MG/ML IV SOLN
1.0000 mg | INTRAVENOUS | Status: AC
  Administered 2022-02-10: 1 mg via INTRAVENOUS
  Filled 2022-02-10: qty 1

## 2022-02-10 MED ORDER — DEXTROSE 50 % IV SOLN
INTRAVENOUS | Status: AC
  Administered 2022-02-10: 50 mL
  Filled 2022-02-10: qty 50

## 2022-02-10 MED ORDER — PHENYLEPHRINE CONCENTRATED 100MG/250ML (0.4 MG/ML) INFUSION SIMPLE
0.0000 ug/min | INTRAVENOUS | Status: DC
  Administered 2022-02-10: 50 ug/min via INTRAVENOUS
  Filled 2022-02-10: qty 250

## 2022-02-10 MED ORDER — CHLORHEXIDINE GLUCONATE 0.12 % MT SOLN
15.0000 mL | Freq: Two times a day (BID) | OROMUCOSAL | Status: DC
  Administered 2022-02-10: 15 mL via OROMUCOSAL
  Filled 2022-02-10: qty 15

## 2022-02-10 MED ORDER — CIPROFLOXACIN IN D5W 400 MG/200ML IV SOLN
500.0000 mg | Freq: Two times a day (BID) | INTRAVENOUS | Status: DC
Start: 2022-02-10 — End: 2022-02-10

## 2022-02-10 MED ORDER — FENTANYL BOLUS VIA INFUSION: 50.0000 ug | INTRAVENOUS | Status: DC | PRN

## 2022-02-10 MED ORDER — SODIUM BICARBONATE 8.4 % IV SOLN
INTRAVENOUS | Status: DC
  Filled 2022-02-10: qty 150

## 2022-02-10 MED ORDER — DEXTROSE 50 % IV SOLN
1.0000 | Freq: Once | INTRAVENOUS | Status: AC
  Administered 2022-02-10: 50 mL via INTRAVENOUS

## 2022-02-10 MED ORDER — SODIUM CHLORIDE 0.9% IV SOLUTION: Freq: Once | INTRAVENOUS | Status: DC

## 2022-02-10 MED ORDER — CALCIUM GLUCONATE-NACL 1-0.675 GM/50ML-% IV SOLN
1.0000 g | Freq: Once | INTRAVENOUS | Status: AC
  Administered 2022-02-10: 1000 mg via INTRAVENOUS
  Filled 2022-02-10: qty 50

## 2022-02-10 MED ORDER — PANTOPRAZOLE SODIUM 40 MG PO TBEC: 40.0000 mg | DELAYED_RELEASE_TABLET | Freq: Every day | ORAL | Status: DC

## 2022-02-10 NOTE — Significant Event (Incomplete)
CARDIOPULMONARY RESUSCITATION Initial rhythm: PEA CPR performance duration: Patient was a limited code, no CPR, ACLS medication given & mechanical intubation performed Was defibrillation or cardioversion used ? No, as the pt was in PEA Was patient intubated? yes  Medications Administered Include:      Yes/no Amiodarone   Atropine   Calcium   Epinephrine  Yes x 1  Lidocaine   Magnesium   Norepinephrine  yes  Phenylephrine   Sodium bicarbonate  Yes x 2  Vasopression    Evaluation Final Status - Was patient successfully resuscitated after 4 minutes and successful intubation with mechanical ventilatory support Current rhythm: ST If successfully resuscitated - what is current hemodynamic status ? Unstable, requiring vasopressor support  Goals of Care:  Family and staff present: Daughter called bedside, daughter very aware of clinical status and has had many [place] discussions.  Summary of discussion:  The Clinical status was relayed to family in detail including cardiac arrest and subsequent interventions. Updated and notified of patients medical condition.  Patient remains unresponsive and will not open eyes to command.   Explained to family course of therapy and the modalities. Discussed the challenge of managing hypotension & tachycardia. Discussed the patient's overall grave prognosis and the likelihood she will cardiac arrest again. Patient is in the Dying  Process associated with Suffering.  Family understands the situation. They would like the patient to remain a Limited CODE.  Family are satisfied with Plan of action and management. 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  - Ventilator settings: PRVC  8 mL/kg, 100% FiO2, 5 PEEP, continue ventilator support & lung protective strategies - Wean PEEP & FiO2 as tolerated, maintain SpO2 > 90% - Head of bed elevated 30 degrees, VAP protocol in place - Plateau pressures less than 30 cm H20  - Intermittent chest x-ray & ABG PRN - Daily WUA with SBT as tolerated  - Ensure adequate pulmonary hygiene  - Continue zosyn - Continue Steroids: solu-medrol 20 mg BID  - Budesonide nebs BID, scheduled Duo Nebs, bronchodilators PRN - Fentanyl and Versed PRN as tolerated  Addendum: Called back bedside as patient's HR dropped into the 30's and developed hypotension on levophed with BP 30's/10's. She did not lose her pulse. She received an additional 1 L of LR, levophed titrated to max & vasopressin added at shock dosing, additional amp of bicarb given with resolution of event.  STAT labs resulted directly after this event: patient hyperkalemic 6.7, AGMA with Serum CO2 21 (after 2 A of bicarb), glucose 58, lactic acidosis > 9 (prior to 1 L of LR), troponin elevated at 347, Hgb dropped to 6.0.  VBG (unable to obtain ABG): 7.16, 76, <31, 27.1 - Insulin 10 units, D 50 x 2, calcium 1 g, lokelma ordered - f/u lactic, trend troponin & K+, INR >> unable to anticoagulate due to drop in hgb - patient too unstable to transfer to CT for abd/pelvis in order to r/o post operative  bleeding - 2 units of PRBC's ordered after type and screen - sodium bicarbonate infusion ordered - patient asynchronous with ventilator with some mild air trapping, peak pressures & plat pressures < 30. I-time decreased to 0.8. Versed IVP given, patient became more hypotensive >>> phenylephrine ordered to support BP enough to mildly sedate the patient.  Fentanyl drip ordered with versed IVP PRN.  Updated daughter bedside, prognosis remains grave, all questions and concerns answered at this time.  Additional CC time 32 mins    Venetia Night, AGACNP-BC Acute Care Nurse Practitioner [facility]   [phone] / [phone] Please see Amion for pager details.

## 2022-02-10 NOTE — Progress Notes (Signed)
PHARMACIST - PHYSICIAN COMMUNICATION  CONCERNING: IV to Oral Route Change Policy  RECOMMENDATION: This patient is receiving pantoprazole by the intravenous route.  Based on criteria approved by the Pharmacy and Therapeutics Committee, the intravenous medication(s) is/are being converted to the equivalent oral dose form(s).   DESCRIPTION: These criteria include: The patient is eating (either orally or via tube) and/or has been taking other orally administered medications for a least 24 hours The patient has no evidence of active gastrointestinal bleeding or impaired GI absorption (gastrectomy, short bowel, patient on TNA or NPO).  If you have questions about this conversion, please contact the [place], [facility] 02/10/2022 10:39 AM

## 2022-02-10 NOTE — Procedures (Signed)
Intubation Procedure Note  Nichole Park  [id number]  [birth year]  Date:02/10/22  Time:7:54 PM   Provider Performing:Anirudh Baiz L [place]    Procedure: Intubation (31500)  Indication(s) Respiratory Failure  Consent Unable to obtain consent due to emergent nature of procedure.   Anesthesia Patient unresponsive and hemodynamically unstable, no sedatives used   Time Out Verified patient identification, verified procedure, site/side was marked, verified correct patient position, special equipment/implants available, medications/allergies/relevant history reviewed, required imaging and test results available.   Sterile Technique Usual hand hygeine, masks, and gloves were used   Procedure Description Patient positioned in bed supine.  Sedation given as noted above.  Patient was intubated with endotracheal tube using Glidescope.  View was Grade 2 only posterior commissure .  Number of attempts was 1.  Colorimetric CO2 detector was consistent with tracheal placement.   Complications/Tolerance None; patient tolerated the procedure well. Chest X-ray is ordered to verify placement.   EBL Minimal   Specimen(s) None   Venetia Night, AGACNP-BC Acute Care Nurse Practitioner [facility]   [phone] / [phone] Please see Amion for pager details.

## 2022-02-10 NOTE — Progress Notes (Signed)
NAME:  Nichole Park, MRN:  [id number], DOB:  06/23/1961, LOS: 3 ADMISSION DATE:  03/06/2022, CONSULTATION DATE: 02/09/2022 REFERRING MD: Dr. Lysle Pearl  CHIEF COMPLAINT:  Hypotension   History of Present Illness:  This is a 61 yo female with a PMH of Stage IV Adenocarcinoma (most recent oncology outpatient progress note on 01/31/22 concern of possible progression of disease despite alimta keytruda maintenance therapy.  Therefore, maintenance therapy discontinued and discussed subsequent line therapy with understanding treatment would be palliative not curative.    She presented to [facility] ER on 06/1 from oncology clinic with a 3 day hx of worsening weakness, bilateral lower extremity weakness, abdominal pain, and confusion.  She also endorsed worsening shortness of breath.  Lab results revealed K+ 127, chloride 95, CO2 18, glucose 429, BUN 27, LDH 311, lactic acid 6.2, wbc 12.0, platelets 95, and fibrinogen 705.  CT Abd/Pelvis revealed perforated sigmoid diverticulitis with diffuse pneumoperitoneum with small gas/fluid collection at the site of diverticulitis and LLL pneumonia.  ER physician consulted General Surgeon Dr. Lysle Pearl and pt transported to OR for exploratory laparotomy/Hartman's procedure.  Pt also started on insulin gtt due to DKA.  During exploratory laparotomy, hartmans procedure pt had bleeding in the left adnexa requiring open left salpingectomy performed by OBGYN Dr. Leafy Ro.  Postop pt became hypotensive requiring neo-synephrine gtt PCCM consulted to assist with management.    02/08/22- Met with daughter and reviewed in [place] and had [place].  02/09/22- patient weaned on Neo from 200 to 25, mentation is improved, H/h trending down and patient is agreable to transfusion. Reviewed plan with daughter. She wants intubation but not CPR limited code status. Contiuing full scope of therapy. Short interval improvement today.  02/10/22- patient on 80%HFNC, able to speak stating she is not feeling better.  Able to wiggle toes on examination which is better then yesterday.  Ongoning tx for septic shock with ecoli bacteremia & same from abd wound.  Weaning vasopressors.   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 Leafy Ro.  Postop developed hypotension requiring neo-synephrine gtt PCCM consulted to assist with management 06/1: CT Abd Pelvis revealed acute perforated sigmoid         diverticulitis as above. Diffuse pneumoperitoneum, with small        gas/fluid collection at the site of diverticulitis. No rim        enhancement to suggest abscess at this time. Left lower lobe        consolidation, favor pneumonia. Please see previous chest x-ray        discussion. Stable findings of a static lung cancer, with        mediastinal adenopathy and right lower lobe nodularity        unchanged. Please see recent chest CT 12/31/2021 full        description. Hepatic steatosis. Aortic Atherosclerosis       (ICD10-I70.0). 06/1: CT Head revealed LEFT parietal white matter vasogenic  edema due to underlying known metastatic lesion as       demonstrated by prior MR. No new intracranial abnormalities.  Micro Data:  Aerobic/Anaerobic 06/1>> MRSA PCR 06/1>> COVID-19 by PCR 06/1>>negative  Blood x2 06/1>>  Antimicrobials:  Cefepime 06/1>> Vancomycin 06/1>> Flagyl  06/1>>    Objective   Blood pressure 115/82, pulse (!) 102, temperature 98.9 F (37.2 C), temperature source Axillary, resp. rate 18, height _0  (1.549 m), weight 59.1 kg, SpO2 92 %.    FiO2 (%):  [50 %-94 %] 80 %   Intake/Output Summary (Last 24 hours) at 02/10/2022 1122 Last data filed at 02/10/2022 1020 Gross per 24 hour  Intake 1217.29 ml  Output 1775 ml  Net -557.71 ml    Filed Weights   02/14/2022 1536 03/06/2022 2314  Weight: 59 kg 59.1 kg   Examination: General: Acute on chronically ill appearing female, on high flow nasal canula  HENT: Supple, no JVD present  Lungs: Faint wheezes throughout, tachypneic with use of accessory muscles to breath  Cardiovascular: NSR, rrr, no r/g, 2+ radial/1+ distal pulses, generalized edema present  Abdomen: no audible BS present, tenderness present     Extremities: Moves all extremities  Neuro: Alert ,oriented, follows commands, PERRLA GU: Indwelling foley catheter draining clear yellow urine   CHEST IMAGING     Assessment & Plan:   Acute on chronic hypoxic respiratory failure secondary to LLL pneumonia, metabolic acidosis in the setting of DKA, and stage IV adenocarcinoma of the lung COPD  - Supplemental O2 for dyspnea and/or hypoxia  - Scheduled and prn bronchodilator therapy  - Nebulized steroids  - Continue abx as outlined above  - Aggressive pulmonary hygiene   2.  Septic shock  -present on addimission  -secondary to ecoli bacteremia  and perforated sigmoid diverticulitis with diffuse pneumoperitoneum and LLL pneumonia - s/p exploratory laparotomy/Hartman's found to have bleeding in the left adnexa requiring open left salpingectomy~006/03/2022 - Continuous telemetry monitoring  - Aggressive iv fluid resuscitation and prn neo-synephrine gtt to maintain map >65 - Continue abx therapy as outlined above - General surgery consulted appreciate input: dressing changes to midline abdominal incisions per recommendations  - NGT to  LIS   3. Metabolic acidosis and lactic acidosis in setting of sepsis and DKA  Hyponatremia secondary to volume depletion  - Trend BMP, lactic acid, and VBG - Replace electrolytes as indicated  - Monitor UOP - Avoid nephrotoxic medications  - 2 amps of sodium bicarb   4. Diabetic ketoacidosis  - Will recheck BMP if ruled in for DKA will restart insulin gtt and DKA protocol   4. Chronic thrombocytopenia  - Trend CBC  - Monitor for s/sx of bleeding - Transfuse for platelet count <50 or signs of bleeding and/or hgb <7 - Avoid chemical VTE px   Postop pain  - Prn oxycodone, tramadol, and dilaudid for pain management   Best Practice (right click and "Reselect all SmartList Selections" daily)   Diet/type: NPO DVT prophylaxis: SCD GI prophylaxis: PPI Lines: N/A Foley:  Yes, and it is still needed Code Status:  full code Last date of multidisciplinary goals of care discussion [N/A]  Will place palliative care consult to discuss goals of treatment and code status  Labs   CBC: Recent Labs  Lab 03/04/2022 1347 02/14/2022 1610 03/06/2022 2326 02/08/22 0410 02/09/22 0415 02/09/22 0457 02/09/22 1121 02/09/22 1611 02/09/22 2212 02/10/22 0418  WBC 12.0* 9.0 9.3 9.2 7.6 7.3  --   --   --  5.8  NEUTROABS 9.3* 6.9 7.1  --   --   --   --   --   --  4.6  HGB 14.6 14.7 13.1 11.6* 8.4* 7.8* 7.6* 8.0* 8.0* 7.9*  HCT 41.3 43.6 39.8 34.2* 24.7* 22.9* 22.7* 23.7* 23.6* 24.1*  MCV 92.8 94.6 96.6 94.5 94.6 93.9  --   --   --  95.3  PLT 95* 94* 76* 74* 35* 32*  --   --   --  30*     Basic Metabolic Panel: Recent Labs  Lab 02/08/22 0410 02/08/22 1021 02/09/22 0415 02/09/22 0457 02/09/22 1325 02/09/22 1415 02/10/22 0418  NA 138 139 135 137 134*  --  139  K 3.0* 3.5 2.3* 2.3* 7.1* 4.1 3.3*  CL 108 103 99 99 104  --  102  CO2 _0 --  29  GLUCOSE 206* 162* 102* 96 123*  --  107*  BUN 24* 21* _1 --  8  CREATININE 0.43* 0.38* 0.34* 0.33* 0.33*  --  0.39*  CALCIUM 7.6*  8.0* 7.9* 7.9* 7.5*  --  7.6*  MG 1.4* 2.7* 2.2 2.2  --   --  2.7*  PHOS 2.8  --  2.3* 2.3* 2.5  --  2.1*    GFR: Estimated Creatinine Clearance: 63.3 mL/min (A) (by C-G formula based on SCr of 0.39 mg/dL (L)). Recent Labs  Lab 02/13/2022 2326 02/12/2022 2345 02/08/22 0158 02/08/22 0410 02/08/22 1021 02/09/22 0415 02/09/22 0457 02/10/22 0418  PROCALCITON 2.74  --   --  3.99  --  3.37  --   --   WBC 9.3  --   --  9.2  --  7.6 7.3 5.8  LATICACIDVEN  --    < > 4.6* 2.3* 2.9* 0.9  --   --    < > = values in this interval not displayed.     Liver Function Tests: Recent Labs  Lab 02/12/2022 1610 03/05/2022 2326 02/08/22 0410 02/09/22 0415 02/10/22 0418  AST 56* 55* 40  --   --   ALT 61* 47* 40  --   --   ALKPHOS 122 78 50  --   --   BILITOT 1.0 0.7 0.8  --   --   PROT 6.0* 4.3* 4.0*  --   --   ALBUMIN 2.1* <1.5* 1.9* 2.6* 2.4*    No results for input(s): LIPASE, AMYLASE in the last 168 hours. No results for input(s): AMMONIA in the last 168 hours.  ABG    Component Value Date/Time   PHART 7.27 (L) 03/05/2022 2235   PCO2ART 28 (L) 02/08/2022 2235   PO2ART 81 (L) 03/06/2022 2235   HCO3 27.2 02/08/2022 0410   O2SAT 76.2 02/08/2022 0410      Coagulation Profile: Recent Labs  Lab 02/26/2022 1347 02/09/22 0550  INR 1.1 1.3*     Cardiac Enzymes: No results for input(s): CKTOTAL, CKMB, CKMBINDEX, TROPONINI in the last 168 hours.  HbA1C: Hgb A1c MFr Bld  Date/Time Value Ref Range Status  01/20/2022 05:00 PM 7.3 (H) 4.8 - 5.6 % Final    Comment:    (NOTE) Pre diabetes:          5.7%-6.4%  Diabetes:              >6.4%  Glycemic control for   <7.0% adults with diabetes   01/18/2022 08:03 PM 7.1 (H) 4.8 - 5.6 % Final    Comment:    (  NOTE) Pre diabetes:          5.7%-6.4%  Diabetes:              >6.4%  Glycemic control for   <7.0% adults with diabetes     CBG: Recent Labs  Lab 02/09/22 1913 02/09/22 2339 02/10/22 0307 02/10/22 0738 02/10/22 1109   GLUCAP 123* 107* 100* 120* 151*     Review of Systems: Positives in BOLD   Gen: Denies fever, chills, weight change, fatigue, night sweats HEENT: Denies blurred vision, double vision, hearing loss, tinnitus, sinus congestion, rhinorrhea, sore throat, neck stiffness, dysphagia PULM: Denies shortness of breath, cough, sputum production, hemoptysis, wheezing CV: Denies chest pain, edema, orthopnea, paroxysmal nocturnal dyspnea, palpitations GI: abdominal pain, nausea, vomiting, diarrhea, hematochezia, melena, constipation, change in bowel habits GU: Denies dysuria, hematuria, polyuria, oliguria, urethral discharge Endocrine: Denies hot or cold intolerance, polyuria, polyphagia or appetite change Derm: Denies rash, dry skin, scaling or peeling skin change Heme: Denies easy bruising, bleeding, bleeding gums Neuro: Denies headache, numbness, weakness, slurred speech, loss of memory or consciousness   Past Medical History:  She,  has a past medical history of Graves disease, Malignant pericardial effusion, Paroxysmal atrial fibrillation ([place]), and Primary lung adenocarcinoma ([place]).   Surgical History:   Past Surgical History:  Procedure Laterality Date   COLECTOMY WITH COLOSTOMY CREATION/HARTMANN PROCEDURE N/A 02/14/2022   Procedure: COLECTOMY WITH COLOSTOMY CREATION/HARTMANN PROCEDURE;  Surgeon: Benjamine Sprague, DO;  Location: [facility] ORS;  Service: General;  Laterality: N/A;   IR CV LINE INJECTION  06/19/2021   IR IMAGING GUIDED PORT INSERTION  04/27/2021   LAPAROTOMY N/A 02/08/2022   Procedure: EXPLORATORY LAPAROTOMY;  Surgeon: Benjamine Sprague, DO;  Location: [facility] ORS;  Service: General;  Laterality: N/A;   PERICARDIOCENTESIS N/A 03/27/2021   Procedure: PERICARDIOCENTESIS;  Surgeon: Nelva Bush, MD;  Location: [facility];  Service: Cardiovascular;  Laterality: N/A;   TUBAL LIGATION     UNILATERAL SALPINGECTOMY Left 03/05/2022   Procedure: OPEN LEFT SALPINGECTOMY;  Surgeon: Benjamine Sprague,  DO;  Location: [facility] ORS;  Service: General;  Laterality: Left;     Social History:   reports that she has quit smoking. Her smoking use included cigarettes. She has a 40.00 pack-year smoking history. She has never used smokeless tobacco. She reports that she does not currently use drugs after having used the following drugs: Marijuana. She reports that she does not drink alcohol.   Family History:  Her family history includes Peripheral Artery Disease in her mother. There is no history of Breast cancer.   Allergies Allergies  Allergen Reactions   Diazepam Other (See Comments)    Very high emotionally and very low emotionally, hyperventilate and passes out Very high emotionally and very low emotionally, hyperventilate and passes out    Keytruda [Pembrolizumab] Rash   Metoprolol    Tapazole [Methimazole]      Home Medications  Prior to Admission medications   Medication Sig Start Date End Date Taking? Authorizing Provider  bismuth subsalicylate (PEPTO BISMOL) 262 MG/15ML suspension Take 30 mLs by mouth every 6 (six) hours as needed. Patient not taking: Reported on 02/15/2022    [provider]  dexamethasone (DECADRON) 2 MG tablet Take 1 tablet (2 mg total) by mouth 2 (two) times daily. 01/21/22 02/20/22  Enzo Bi, MD  diltiazem (CARDIZEM CD) 180 MG 24 hr capsule Take 1 capsule (180 mg total) by mouth daily. 11/28/21 02/26/22  Rise Mu, PA-C  folic acid (FOLVITE) 1 MG tablet  Take 1 tablet (1 mg total) by mouth once daily. 10/15/21   Cammie Sickle, MD  insulin glargine (LANTUS) 100 UNIT/ML Solostar Pen Inject 10 Units into the skin at bedtime. 01/21/22 04/21/22  Enzo Bi, MD  Insulin Pen Needle 31G X 6 MM MISC 1 Units by Does not apply route at bedtime. 01/21/22   Enzo Bi, MD  metFORMIN (GLUCOPHAGE) 500 MG tablet Take 2 tablets (1,000 mg total) by mouth 2 (two) times daily with a meal. 01/31/22   Cammie Sickle, MD  Multiple Vitamin (MULTI-VITAMINS) TABS Take 1 tablet  by mouth daily. Patient not taking: Reported on 02/23/2022    [provider]  ondansetron (ZOFRAN) 4 MG tablet Take 2 tablets (34m total) by mouth once every 8 hours as needed for nausea and vomiting. 04/16/21   BCammie Sickle MD  TURMERIC PO Take 1 tablet by mouth daily. Patient not taking: Reported on 03/05/2022    [provider]     Critical care provider statement:   Total critical care time: 33 minutes   Performed by: ALanney GinsMD   Critical care time was exclusive of separately billable procedures and treating other patients.   Critical care was necessary to treat or prevent imminent or life-threatening deterioration.   Critical care was time spent personally by me on the following activities: development of treatment plan with patient and/or surrogate as well as nursing, discussions with consultants, evaluation of patient's response to treatment, examination of patient, obtaining history from patient or surrogate, ordering and performing treatments and interventions, ordering and review of laboratory studies, ordering and review of radiographic studies, pulse oximetry and re-evaluation of patient's condition.    FOttie Glazier M.D.  Pulmonary & Critical Care Medicine

## 2022-02-10 NOTE — Progress Notes (Signed)
Subjective:  CC: Nichole Park is a 61 y.o. female  Hospital stay day 3, 3 Days Post-Op Hartman's for perforated diverticulitis  HPI: Transferred to ICU post op due to septic shock.  Abdominal pain reported. No flatus or stool in bag.  Family at bedside.  Appears alert and in no distress.   Objective:   Temp:  [98 F (36.7 C)-98.8 F (37.1 C)] 98.2 F (36.8 C) (06/04 0400) Pulse Rate:  [88-102] 95 (06/04 0615) Resp:  [13-25] 14 (06/04 0615) BP: (88-123)/(64-97) 121/83 (06/04 0615) SpO2:  [87 %-99 %] 96 % (06/04 0615) FiO2 (%):  [50 %-75 %] 65 % (06/04 0400)     Height: 5\' 1"  (154.9 cm) Weight: 59.1 kg BMI (Calculated): 24.63   Intake/Output this shift:   Intake/Output Summary (Last 24 hours) at 02/10/2022 0636 Last data filed at 02/10/2022 0400 Gross per 24 hour  Intake 1620.03 ml  Output 898 ml  Net 722.03 ml     Constitutional :  alert, cooperative, appears stated age, and mild distress  Respiratory:  Wheezing noted.  On high flow [place]  Cardiovascular:  regular rate and rhythm  Gastrointestinal: Soft, no guarding but focal TTP ostomy swollen, beefy red, no active bleeding . NG with bilious output.  Minimal stool from stoma, no gas.   Skin: Cool and moist. Honeycomb incision site dressing intact  Psychiatric: Normal affect, non-agitated, not confused       LABS:     Latest Ref Rng & Units 02/10/2022    4:18 AM 02/09/2022    2:15 PM 02/09/2022    1:25 PM  CMP  Glucose 70 - 99 mg/dL 107    123    BUN 6 - 20 mg/dL 8    7    Creatinine 0.44 - 1.00 mg/dL 0.39    0.33    Sodium 135 - 145 mmol/L 139    134    Potassium 3.5 - 5.1 mmol/L 3.3   4.1   7.1    Chloride 98 - 111 mmol/L 102    104    CO2 22 - 32 mmol/L 29    27    Calcium 8.9 - 10.3 mg/dL 7.6    7.5        Latest Ref Rng & Units 02/10/2022    4:18 AM 02/09/2022   10:12 PM 02/09/2022    4:11 PM  CBC  WBC 4.0 - 10.5 K/uL 5.8      Hemoglobin 12.0 - 15.0 g/dL 7.9   8.0   8.0    Hematocrit 36.0 - 46.0 % 24.1   23.6   23.7     Platelets 150 - 400 K/uL 30        RADS: N/a Assessment:   S/p hartmans' for perforated diverticulitis.  Continue supportive care per ICU team.  Prognosis still guarded due to persistent need for pressors and fragile respiratory status.  Palliative consult already in place.    labs/images/medications/previous chart entries reviewed personally and relevant changes/updates noted above.

## 2022-02-10 NOTE — Progress Notes (Signed)
PHARMACY CONSULT NOTE  Pharmacy Consult for Electrolyte Monitoring and Replacement   Recent Labs: Potassium (mmol/L)  Date Value  02/10/2022 3.3 (L)  12/01/2013 3.7   Magnesium (mg/dL)  Date Value  02/10/2022 2.7 (H)   Calcium (mg/dL)  Date Value  02/10/2022 7.6 (L)   Calcium, Total (mg/dL)  Date Value  12/01/2013 8.8   Albumin (g/dL)  Date Value  02/09/2022 2.6 (L)  12/01/2013 3.7   Phosphorus (mg/dL)  Date Value  02/10/2022 2.1 (L)   Sodium (mmol/L)  Date Value  02/10/2022 139  12/01/2013 137   Assessment:   61 y/o female w/ PMH of metastatic lung ca on chemo with mets to brain, COPD, who presented with DKA and septic shock secondary perforated sigmoid diverticulitis with diffuse pneumoperitoneum and LLL pneumonia. Pharmacy is asked to follow and replace electrolytes while in CCU  Goal of Therapy:  Electrolytes within normal limits  Plan:  --K 3.3, IV Kcl 10 mEq x 4 runs + 22.5 mEq from IV potassium phosphate --Phos 2.1, IV potassium phosphate 15 mmol x 1 --Follow-up electrolytes with AM labs tomorrow  Benita Gutter 02/10/2022 7:25 AM

## 2022-02-10 NOTE — Progress Notes (Signed)
Went into patients room for bedside report with Tess, RN and patient's blood pressure had not read during cycle. Patient's significant other at bedside holding patient's hand. Patient not arousable to voice. Agonal respirations. Unable to palpate radial pulse but able to palpate carotid pulse. Glory Rosebush, NP and Britton-Lee, NP to bedside immediatly who were both on unit and present. NPs gave orders for emergent intubation. Daughter called to come back to hospital. Patient lost pulse just prior to intubation. Partial Code with no CPR therefore 1 amp of epi given along with 2 amps of bicarb per NPs order. Patient intubated at [place] and pulse regained. Levophed drip started.

## 2022-02-10 NOTE — Progress Notes (Signed)
Notified Donell Beers, NP that patient became tachycardic after her bath and short of breath. c/o chest pain with cough. Gave 0.5 mg dilaudid. she got a duoneb and is still short of breath and tachycardic 140's. NP acknowledged and stated she will come see the patient.

## 2022-02-10 NOTE — Progress Notes (Signed)
Chaplain responded to request for family support following daughter's emotional distress regarding pt's deterioration.  Daughter acknowledges mom's decline is both a surprise and not a surprise.  Present was pt's long-term (20 year) partner, [name].  Chaplain provided orientation to services and offered support and hospitality, which were declined.  Please contact as requested for support.   Luana Shu [phone]    02/10/22 1900  Clinical Encounter Type  Visited With Patient and family together  Visit Type Initial;Critical Care  Referral From Nurse  Consult/Referral To Chaplain  Spiritual Encounters  Spiritual Needs Emotional  Stress Factors  Patient Stress Factors Health changes  Family Stress Factors Family relationships

## 2022-02-10 NOTE — Significant Event (Incomplete)
CARDIOPULMONARY RESUSCITATION Initial rhythm: PEA CPR performance duration: Patient was a limited code, no CPR, ACLS medication given & mechanical intubation performed Was defibrillation or cardioversion used ? No, as the pt was in PEA Was patient intubated? yes  Medications Administered Include:      Yes/no Amiodarone   Atropine   Calcium   Epinephrine  Yes x 1  Lidocaine   Magnesium   Norepinephrine  yes  Phenylephrine   Sodium bicarbonate  Yes x 2  Vasopression    Evaluation Final Status - Was patient successfully resuscitated after 4 minutes and successful intubation with mechanical ventilatory support Current rhythm: ST If successfully resuscitated - what is current hemodynamic status ? Unstable, requiring vasopressor support  Goals of Care:  Family and staff present: Daughter called bedside, daughter very aware of clinical status and has had many [place] discussions.  Summary of discussion:  The Clinical status was relayed to family in detail including cardiac arrest and subsequent interventions. Updated and notified of patients medical condition.  Patient remains unresponsive and will not open eyes to command.   Explained to family course of therapy and the modalities. Discussed the challenge of managing hypotension & tachycardia. Discussed the patient's overall grave prognosis and the likelihood she will cardiac arrest again. Patient is in the Dying  Process associated with Suffering.  Family understands the situation. They would like the patient to remain a Limited CODE.  Family are satisfied with Plan of action and management. 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  - Ventilator settings: PRVC  8 mL/kg, 100% FiO2, 5 PEEP, continue ventilator support & lung protective strategies - Wean PEEP & FiO2 as tolerated, maintain SpO2 > 90% - Head of bed elevated 30 degrees, VAP protocol in place - Plateau pressures less than 30 cm H20  - Intermittent chest x-ray & ABG PRN - Daily WUA with SBT as tolerated  - Ensure adequate pulmonary hygiene  - Continue zosyn - Continue Steroids: solu-medrol 20 mg BID  - Budesonide nebs BID, scheduled Duo Nebs, bronchodilators PRN - Fentanyl and Versed PRN as tolerated  Addendum: Called back bedside as patient's HR dropped into the 30's and developed hypotension on levophed with BP 30's/10's. She did not lose her pulse. She received an additional 1 L of LR, levophed titrated to max & vasopressin added at shock dosing, additional amp of bicarb given with resolution of event.  STAT labs resulted directly after this event: patient hyperkalemic 6.7, AGMA with Serum CO2 21 (after 2 A of bicarb), glucose 58, lactic acidosis > 9 (prior to 1 L of LR), troponin elevated at 347, Hgb dropped to 6.0.  VBG (unable to obtain ABG): 7.16, 76, <31, 27.1 - Insulin 10 units, D 50 x 2, calcium 1 g ordered - f/u lactic, troponin & K+  - 2 units of PRBC's ordered after type and screen - sodium bicarbonate infusion ordered - patient asynchronous with ventilator with some mild air trapping, peak pressures &  plat pressures < 30.    Additional CC time 32 mins    Venetia Night, AGACNP-BC Acute Care Nurse Practitioner [facility]   [phone] / [phone] Please see Amion for pager details.

## 2022-02-11 ENCOUNTER — Inpatient Hospital Stay: Payer: Medicaid Other

## 2022-02-11 ENCOUNTER — Encounter: Payer: Self-pay | Admitting: Surgery

## 2022-02-11 DIAGNOSIS — E1110 Type 2 diabetes mellitus with ketoacidosis without coma: Secondary | ICD-10-CM

## 2022-02-11 DIAGNOSIS — J9601 Acute respiratory failure with hypoxia: Secondary | ICD-10-CM

## 2022-02-11 DIAGNOSIS — Z9911 Dependence on respirator [ventilator] status: Secondary | ICD-10-CM

## 2022-02-11 DIAGNOSIS — K5720 Diverticulitis of large intestine with perforation and abscess without bleeding: Secondary | ICD-10-CM

## 2022-02-11 DIAGNOSIS — E871 Hypo-osmolality and hyponatremia: Secondary | ICD-10-CM

## 2022-02-11 DIAGNOSIS — I469 Cardiac arrest, cause unspecified: Secondary | ICD-10-CM

## 2022-02-11 DIAGNOSIS — Z978 Presence of other specified devices: Secondary | ICD-10-CM

## 2022-02-11 LAB — TROPONIN I (HIGH SENSITIVITY): Troponin I (High Sensitivity): 803 ng/L (ref <18)

## 2022-02-11 LAB — SURGICAL PATHOLOGY

## 2022-02-11 LAB — POTASSIUM: Potassium: 5.6 mmol/L — ABNORMAL HIGH (ref 3.5–5.1)

## 2022-02-11 LAB — PROTIME-INR
INR: 2.3 — ABNORMAL HIGH (ref 0.8–1.2)
Prothrombin Time: 25 seconds — ABNORMAL HIGH (ref 11.4–15.2)

## 2022-02-11 LAB — LACTIC ACID, PLASMA: Lactic Acid, Venous: >9 mmol/L (ref 0.5–1.9)

## 2022-02-12 ENCOUNTER — Encounter: Payer: Medicaid Other | Admitting: Physical Therapy

## 2022-02-12 LAB — BPAM RBC
Blood Product Expiration Date: 202307032359
Blood Product Expiration Date: 202307032359
Unit Type and Rh: 7300
Unit Type and Rh: 7300

## 2022-02-12 LAB — TYPE AND SCREEN
ABO/RH(D): B POS
Antibody Screen: NEGATIVE
Unit division: 0
Unit division: 0

## 2022-02-12 LAB — AEROBIC/ANAEROBIC CULTURE W GRAM STAIN (SURGICAL/DEEP WOUND): Gram Stain: NONE SEEN

## 2022-02-12 LAB — PREPARE RBC (CROSSMATCH)

## 2022-02-14 ENCOUNTER — Inpatient Hospital Stay: Payer: Medicaid Other

## 2022-02-14 ENCOUNTER — Encounter: Payer: Self-pay | Admitting: Internal Medicine

## 2022-02-14 ENCOUNTER — Inpatient Hospital Stay: Payer: Medicaid Other | Admitting: Internal Medicine

## 2022-02-14 LAB — BLOOD GAS, ARTERIAL
Acid-base deficit: 3.4 mmol/L — ABNORMAL HIGH (ref 0.0–2.0)
Bicarbonate: 12.9 mmol/L — ABNORMAL LOW (ref 20.0–28.0)
Bicarbonate: 27.1 mmol/L (ref 20.0–28.0)
FIO2: 100 %
O2 Saturation: 27.7 %
Patient temperature: 37
pCO2 arterial: 28 mmHg — ABNORMAL LOW (ref 32–48)
pCO2 arterial: 76 mmHg (ref 32–48)
pH, Arterial: 7.27 — ABNORMAL LOW (ref 7.35–7.45)
pH, Arterial: 7.16 — CL (ref 7.35–7.45)
pO2, Arterial: 81 mmHg — ABNORMAL LOW (ref 83–108)
pO2, Arterial: <31 mmHg — CL (ref 83–108)

## 2022-02-14 NOTE — Progress Notes (Signed)
I called to express my condolences to patient's significant other, Shanon Brow. Left a voicemail.

## 2022-02-15 ENCOUNTER — Ambulatory Visit: Payer: Medicaid Other

## 2022-02-19 ENCOUNTER — Encounter: Payer: Medicaid Other | Admitting: Physical Therapy

## 2022-02-26 ENCOUNTER — Encounter: Payer: Medicaid Other | Admitting: Physical Therapy

## 2022-03-05 ENCOUNTER — Encounter: Payer: Medicaid Other | Admitting: Physical Therapy

## 2022-03-09 NOTE — Progress Notes (Addendum)
Chaplain responded to pt death.  Provided hospitality and grief support to pt's daughter.  Gave patient placement card to daughter and noted [facility] info*. Dau thinks that [place] will be [facility], but she needs to check insurance.  Chaplain instructed dau to call patient placement with the information as soon as she can confirm it.   Dau does not think pt's two other adult children (both homeless) will come, and will inform us within in the hour if anyone else will come.  Advised RN.    Will remain available as needed.  Minus [name], Chaplain Pager: [phone]  *[facility]:  [address] [place], [place] [address] [phone]

## 2022-03-09 NOTE — Significant Event (Signed)
CARDIOPULMONARY RESUSCITATION Initial rhythm: asystole Patient is a limited code, no CPR performed Was defibrillation or cardioversion used ? Yes for V-fib at 200 J x 3   Medications Administered Include:      Yes/no Amiodarone   Atropine   Calcium X 1  Epinephrine  X 4  Lidocaine   Magnesium   Norepinephrine infusing  Phenylephrine infusing  Sodium bicarbonate  X 2  Vasopression infusing   Evaluation Final Status - Daughter bedside during the event. After 3 unsuccessful defibrillations, time of death called at 01:03. Daughter understanding and in agreement.   Nichole Park, AGACNP-BC Acute Care Nurse Practitioner [facility]   [phone] / [phone] Please see Amion for pager details.

## 2022-03-09 NOTE — Progress Notes (Signed)
At 0054 Pt lost pulses for 4 mts.received ACLS meds and defibrillation x3 with out sucesses. Daughter at bed side. Prounced death at 0103 am. [facility] was called . Spoke with.Bethena Roys donates was denied.Ref #[id number].

## 2022-03-09 NOTE — Death Summary Note (Signed)
DEATH SUMMARY   Patient Details  Name: Nichole Park MRN: [id number] DOB: 1961-07-21  Admission/Discharge Information   Admit Date:  03/06/2022  Date of Death:  03/10/22  Time of Death:  01:03  Length of Stay: 4  Referring Physician: Lemay   Reason(s) for Hospitalization  Perforated sigmoid diverticulitis with diffuse pneumoperitoneum  requiring emergent abdominal surgery.  Diagnoses  Preliminary cause of death: Sepsis Secondary Diagnoses (including complications and co-morbidities):  Principal Problem:   Septic shock ([place]) Active Problems:   Perforated diverticulum of large intestine   Acute respiratory failure with hypoxia and hypercapnia (HCC)   Endotracheal tube present   On mechanically assisted ventilation (HCC)   Cardiac arrest, cause unspecified ([place])   DKA (diabetic ketoacidosis) ([place])   Hyponatremia   Brief Hospital Course (including significant findings, care, treatment, and services provided and events leading to death)  Nichole Park is a 61 y.o. year old female who has a past medical history of stage IV adenocarcinoma who was recently transitioned to palliative therapy.  She presented to [facility] ED on 03-06-22 from the oncology clinic with a 3-day history of worsening overall weakness, particularly bilateral lower extremity weakness, abdominal pain and confusion as well as worsening shortness of breath.  Lab work revealed hyponatremia DKA with an anion gap metabolic acidosis, lactic acidosis, leukocytosis and thrombocytopenia.  CT abdomen and pelvis revealed perforated sigmoid diverticulitis with diffuse pneumoperitoneum with a small gas/fluid collection at the site of the diverticulitis and the left lower lobe pneumonia.  Surgery consulted emergently for an exploratory laparotomy/Hartman's procedure.  Patient was started on insulin drip due to DKA.  During the accident patient had bleeding in the left adnexa requiring open salpingectomy performed by  OB/GYN Dr. Leafy Ro.  Postop patient became hypotensive requiring Neo-Synephrine drip and PCCM consulted to assist with management. 02/08/22- Met with daughter and reviewed in [place] and had [place].  02/09/22- patient weaned on Neo from 200 to 25, mentation is improved, H/h trending down and patient is agreable to transfusion. Reviewed plan with daughter. She wants intubation but not CPR limited code status. Contiuing full scope of therapy. Short interval improvement today.  02/10/22- patient on 80%HFNC, able to speak stating she is not feeling better. Able to wiggle toes on examination which is better then yesterday.  Ongoning tx for septic shock with ecoli bacteremia & same from abd wound.  Weaning vasopressors.  In the evening patient deteriorated with respiratory failure leading to respiratory/cardiac arrest.  Patient lost pulses for 4 minutes receiving ACLS medications and emergent intubation on mechanical ventilatory support.  After obtaining ROSC patient significantly hypotensive requiring multiple vasopressors, hyperkalemic with shifting measures provided, anemic with blood ordered and significant lactic acidosis with fluid and sodium bicarbonate administered.  Overnight with daughter bedside patient again lost pulses, ACLS medications given and defibrillation administered x 3 without success.  Death called at 01:03 with daughter bedside.  Pertinent Labs and Studies  Significant Diagnostic Studies DG Abd 1 View  Result Date: 03/06/22 CLINICAL DATA:  NG tube placement. EXAM: ABDOMEN - 1 VIEW COMPARISON:  CT earlier today FINDINGS: Tip and side port of the enteric tube below the diaphragm in the stomach. There is excreted IV contrast within both renal collecting systems from CT earlier today. Surgical drain in the left abdomen. IMPRESSION: Tip and side port of the enteric tube below the diaphragm in the stomach. Electronically Signed   By: Keith Rake M.D.   On: 03/06/2022 23:49  CT Head  Wo Contrast  Result Date: 02/22/2022 CLINICAL DATA:  Confusion, anorexia, severe abdominal pain, swelling in hands and feet, shortness of breath, history lung cancer EXAM: CT HEAD WITHOUT CONTRAST TECHNIQUE: Contiguous axial images were obtained from the base of the skull through the vertex without intravenous contrast. RADIATION DOSE REDUCTION: This exam was performed according to the departmental dose-optimization program which includes automated exposure control, adjustment of the mA and/or kV according to patient size and/or use of iterative reconstruction technique. COMPARISON:  01/18/2022 CT head, 01/19/2022 MR head FINDINGS: Brain: Normal ventricular morphology. No midline shift or mass effect. Again identified area of low attenuation LEFT parietal white matter, 3.5 cm diameter, consistent with vasogenic edema, patient with known metastatic lesion by prior MR. No intracranial hemorrhage, additional mass lesion or evidence of acute infarction. No extra-axial fluid collections. Vascular: Minimal atherosclerotic calcification of internal carotid arteries at skull base Skull: Intact Sinuses/Orbits: Clear Other: N/A IMPRESSION: LEFT parietal white matter vasogenic edema due to underlying known metastatic lesion as demonstrated by prior MR. No new intracranial abnormalities. Electronically Signed   By: Lavonia Dana M.D.   On: 02/18/2022 16:59   CT HEAD WO CONTRAST (5MM)  Result Date: 01/18/2022 CLINICAL DATA:  History of metastatic non-small cell lung cancer. EXAM: CT HEAD WITHOUT CONTRAST TECHNIQUE: Contiguous axial images were obtained from the base of the skull through the vertex without intravenous contrast. RADIATION DOSE REDUCTION: This exam was performed according to the departmental dose-optimization program which includes automated exposure control, adjustment of the mA and/or kV according to patient size and/or use of iterative reconstruction technique. COMPARISON:  Head CT and MRI brain 12/31/2021.  FINDINGS: Brain: Overall definite interval improvement in the amount of tumoral edema seen in the left parietal white matter. The actual metastatic focus is difficult to identify this study without contrast and is better seen on the MRI. No new areas of edema to suggest other metastatic lesions. No evidence of acute hemispheric infarction or intracranial hemorrhage. The brainstem and cerebellum are grossly and stable. Vascular: Minimal vascular calcifications are stable. No hyperdense vessels. Skull: No skull lesions. Sinuses/Orbits: The paranasal sinuses and mastoid air cells are clear. The globes are intact. Other: No scalp lesions or scalp hematoma. IMPRESSION: 1. Overall definite interval improvement in the amount of tumoral edema in the left parietal white matter. The actual metastatic focus is difficult to identify this study without contrast and is better seen on the MRI. 2. No new areas of edema to suggest other metastatic lesions. Electronically Signed   By: Marijo Sanes M.D.   On: 01/18/2022 14:30   MR BRAIN W WO CONTRAST  Result Date: 01/20/2022 CLINICAL DATA:  Initial evaluation for brain CNS neoplasm. EXAM: MRI HEAD WITHOUT AND WITH CONTRAST TECHNIQUE: Multiplanar, multiecho pulse sequences of the brain and surrounding structures were obtained without and with intravenous contrast. CONTRAST:  41m GADAVIST GADOBUTROL 1 MMOL/ML IV SOLN COMPARISON:  Prior MRI from 12/31/2021. FINDINGS: Brain: Previously identified intracranial metastatic deposit positioned at the left parietal lobe again seen. Overall, lesion is not significantly changed in overall size measuring 1.7 x 1.7 x 1.9 cm. However, the lesion demonstrates diminished peripheral enhancement as compared to previous exam. Additionally, associated vasogenic edema within the adjacent left parietal region is also improved. Improved mass effect on the left lateral ventricle without significant midline shift. Overall, changes are consistent with  interval response to therapy. No new lesions identified. No acute or subacute infarct. Gray-white matter differentiation otherwise maintained. Underlying mild chronic  microvascular ischemic disease noted. No acute intracranial hemorrhage. Small focus of chronic hemosiderin staining at the left frontal lobe noted, stable. No other new acute or chronic intracranial blood products. No other mass lesion or mass effect. No hydrocephalus or extra-axial fluid collection. Pituitary gland suprasellar region normal. Midline structures intact and normal. No other abnormal enhancement. Vascular: Major intracranial vascular flow voids are maintained. Skull and upper cervical spine: Craniocervical junction with normal limits. Bone marrow signal intensity normal. No focal marrow replacing lesion. No scalp soft tissue abnormality. Sinuses/Orbits: Globes and orbital soft tissues within normal limits. Mild scattered mucosal thickening noted about the sphenoid ethmoidal and maxillary sinuses. Small bilateral mastoid effusions noted. Other: None. IMPRESSION: 1. Similar size of left parietal metastatic lesion, but with decreased enhancement and associated vasogenic edema, consistent with interval response to therapy. Secondary improved mass effect within the left parietal region as compared to previous. No new lesions identified. 2. No other new or acute intracranial abnormality. Electronically Signed   By: Jeannine Boga M.D.   On: 01/20/2022 01:50   CT ABDOMEN PELVIS W CONTRAST  Result Date: 02/19/2022 CLINICAL DATA:  Abdominal pain, lower extremity swelling, short of breath worsening over last 3 days, history of lung cancer EXAM: CT ABDOMEN AND PELVIS WITH CONTRAST TECHNIQUE: Multidetector CT imaging of the abdomen and pelvis was performed using the standard protocol following bolus administration of intravenous contrast. RADIATION DOSE REDUCTION: This exam was performed according to the departmental dose-optimization  program which includes automated exposure control, adjustment of the mA and/or kV according to patient size and/or use of iterative reconstruction technique. CONTRAST:  137m OMNIPAQUE IOHEXOL 300 MG/ML  SOLN COMPARISON:  12/31/2021, 10/01/2021, 02/25/2022 FINDINGS: Lower chest: Dense left lower lobe consolidation favor pneumonia given rapid development since prior exams. Nodularity within the right middle and right lower lobe consistent with known metastatic lung cancer. Necrotic appearing 1 cm lymph node anterior to the aorta in the left infrahilar region unchanged. Hepatobiliary: Hepatic steatosis. No focal liver abnormality. Gallbladder is unremarkable. No biliary duct dilation. Pancreas: Unremarkable. No pancreatic ductal dilatation or surrounding inflammatory changes. Spleen: Normal in size without focal abnormality. Adrenals/Urinary Tract: Stable appearance of the kidneys. No urinary tract calculi or obstruction. The adrenals and bladder are unremarkable. Stomach/Bowel: There is distal colonic diverticulosis, with wall thickening and fat stranding of the sigmoid colon consistent with acute diverticulitis. There is free fluid in free gas adjacent to the inflamed sigmoid colon consistent with perforation. Small multilocular gas and fluid collection along the left pelvic sidewall measures up to 2.1 x 2.9 cm, without rim enhancement to suggest abscess at this time. No bowel obstruction or ileus. Mild wall thickening of the small bowel within the lower central abdomen adjacent to the perforated sigmoid colon likely secondary inflammation. Vascular/Lymphatic: Stable aortic atherosclerosis. No pathologic adenopathy. Reproductive: Uterus and bilateral adnexa are unremarkable. Other: Pneumoperitoneum from perforated sigmoid diverticulitis as above. Small amount of free fluid within the lower abdomen, with localized gas/fluid collection adjacent to the sigmoid diverticulitis as described above. No evidence of abdominal  wall hernia. Musculoskeletal: No acute or destructive bony lesions. No acute or destructive bony scotch that reconstructed images demonstrate no additional findings. IMPRESSION: 1. Acute perforated sigmoid diverticulitis as above. Diffuse pneumoperitoneum, with small gas/fluid collection at the site of diverticulitis. No rim enhancement to suggest abscess at this time. 2. Left lower lobe consolidation, favor pneumonia. Please see previous chest x-ray discussion. 3. Stable findings of a static lung cancer, with mediastinal adenopathy and right lower lobe  nodularity unchanged. Please see recent chest CT 12/31/2021 full description. 4. Hepatic steatosis. 5.  Aortic Atherosclerosis (ICD10-I70.0). Critical Value/emergent results were called by telephone at the time of interpretation on 02/13/2022 at 5:05 pm to provider [facility] , who verbally acknowledged these results. Electronically Signed   By: Randa Ngo M.D.   On: 03/08/2022 17:05   US Venous Img Upper Uni Left (DVT)  Result Date: 02/09/2022 CLINICAL DATA:  Left upper extremity edema.  PICC in place. EXAM: LEFT UPPER EXTREMITY VENOUS DOPPLER ULTRASOUND TECHNIQUE: Gray-scale sonography with graded compression, as well as color Doppler and duplex ultrasound were performed to evaluate the upper extremity deep venous system from the level of the subclavian vein and including the jugular, axillary, basilic, radial, ulnar and upper cephalic vein. Spectral Doppler was utilized to evaluate flow at rest and with distal augmentation maneuvers. COMPARISON:  None Available. FINDINGS: Contralateral Subclavian Vein: Respiratory phasicity is normal and symmetric with the symptomatic side. No evidence of thrombus. Normal compressibility. Internal Jugular Vein: No evidence of thrombus. Normal compressibility, respiratory phasicity and response to augmentation. Subclavian Vein: No evidence of thrombus. Normal compressibility, respiratory phasicity and response to  augmentation. Axillary Vein: No evidence of thrombus. Normal compressibility, respiratory phasicity and response to augmentation. Cephalic Vein: No evidence of thrombus. Normal compressibility, respiratory phasicity and response to augmentation. Basilic Vein: No evidence of thrombus. Normal compressibility, respiratory phasicity and response to augmentation. Brachial Veins: No evidence of thrombus. Normal compressibility, respiratory phasicity and response to augmentation. Radial Veins: No evidence of thrombus. Normal compressibility, respiratory phasicity and response to augmentation. Ulnar Veins: No evidence of thrombus. Normal compressibility, respiratory phasicity and response to augmentation. Venous Reflux:  None visualized. Other Findings:  None visualized. IMPRESSION: No evidence of DVT within the left upper extremity. Electronically Signed   By: Jacqulynn Cadet M.D.   On: 02/09/2022 07:40   DG Chest Port 1 View  Result Date: 03-03-2022 CLINICAL DATA:  Endotracheal tube present. EXAM: PORTABLE CHEST 1 VIEW COMPARISON:  Radiograph yesterday.  CT 12/31/2021 FINDINGS: The endotracheal tube tip is approximately 2.4 cm from the carina just below the clavicular heads. Right central line and left upper extremity PICC remain in place. Enteric tube remains in place. Hazy opacity throughout the left hemithorax which has progressed from yesterday. Right upper lobe lung mass, as seen on prior CT. Stable heart size and mediastinal contours. No pneumothorax. IMPRESSION: 1. Endotracheal tube tip approximately 2.4 cm from the carina just below the clavicular heads. Remaining support apparatus unchanged. 2. Hazy opacity throughout the left hemithorax, increased from yesterday, likely combination of pleural fluid and airspace disease. 3. Right upper lobe lung mass, as seen on prior CT. Electronically Signed   By: Keith Rake M.D.   On: 03-03-22 00:43   DG Chest Port 1 View  Result Date: 02/10/2022 CLINICAL DATA:   [id number]; intubated EXAM: PORTABLE CHEST 1 VIEW COMPARISON:  Same day radiograph radiograph as well as radiograph dated February 07, 2022 FINDINGS: The cardiomediastinal silhouette is unchanged in contour.ETT tip terminates 6 mm above the carina. Enteric tube tip and side port project over the stomach. RIGHT chest port tip terminates over the RIGHT atrium. LEFT extremity CVC tip terminates over the superior cavoatrial junction. No pneumothorax. Irregular RIGHT lung nodular opacity consistent with known malignancy. Increased LEFT retrocardiac opacity in comparison to more remote priors but similar in comparison to most recent prior. Small LEFT pleural effusion. Patchy RIGHT basilar reticular nodularity. IMPRESSION: 1. ETT tip terminates 6 mm above the  carina. 2. LEFT retrocardiac opacity with similar comparison most recent prior, but increased in comparison to prior from June 1st. This likely reflects increased atelectasis versus infection. 3. Small LEFT pleural effusion. Electronically Signed   By: Valentino Saxon M.D.   On: 02/10/2022 20:21   DG Chest Port 1 View  Result Date: 02/10/2022 CLINICAL DATA:  Diabetic ketoacidosis, septic shock EXAM: PORTABLE CHEST 1 VIEW COMPARISON:  Prior chest x-ray 02/08/2022 FINDINGS: Gastric tube present. The tip lies off the field of view, presumably within the stomach. Left upper extremity PICC. Catheter tip at the cavoatrial junction. Right IJ power injectable port catheter also with the catheter tip at the cavoatrial junction. Persistent dense left basilar airspace opacity actually progressed compared to 06/02 likely due to enlarging pleural effusion and atelectasis versus infiltrate. Spiculated nodule in the right upper lung. IMPRESSION: 1. Progressive left basilar opacity likely reflects and enlarging pleural effusion with associated atelectasis or infiltrate. 2. New left upper extremity PICC is well positioned with the tip overlying the cavoatrial junction. 3. Otherwise,  unchanged appearance of the chest. Electronically Signed   By: Jacqulynn Cadet M.D.   On: 02/10/2022 08:03   DG Chest Port 1 View  Result Date: 02/08/2022 CLINICAL DATA:  Respiratory failure, right upper lobe lung cancer. EXAM: PORTABLE CHEST 1 VIEW COMPARISON:  Portable chest yesterday at 4:22 p.m., portable chest 01/18/2022. FINDINGS: 6:25 a.m., 02/08/2022. NGT has been inserted the tip in the distal gastric antrum. Right IJ port catheter tip remains in distal SVC. 8 cm rounded left perihilar opacity is again noted and could be a rounded pneumonia less likely large mass given that this was not seen on May 12. There is additional patchy hazy increased opacity in the left lower lung field most likely due to pneumonia, with right upper lobe dense irregular mass approaching 4 cm again shown. Other scattered small nodules in the lung fields on the chest CT of 12/31/2021 are not well seen radiographically. There is a small left pleural layering effusion. No right effusion is seen or pneumothorax. Remaining lungs clear. The cardiac size is normal. IMPRESSION: 1. 8 cm left perihilar opacity. Probable rounded pneumonia, less likely mass. 2. Patchy hazy opacities in the left lower lung field most likely due to pneumonia. Small left effusion. 3. Stable right upper lobe irregular mass. 4. Support devices as above. Electronically Signed   By: Telford Nab M.D.   On: 02/08/2022 06:48   DG Chest Port 1 View  Addendum Date: 02/25/2022   ADDENDUM REPORT: 02/25/2022 17:00 ADDENDUM: On CT abdomen performed 15 minutes after this radiograph, there is note of free air within the abdomen. In retrospect, there is a small sliver of lucency on this frontal radiograph deep to the right hemidiaphragm that may represent the free air better seen on today's same day CT. Electronically Signed   By: Yvonne Kendall M.D.   On: 03/03/2022 17:00   Result Date: 03/07/2022 CLINICAL DATA:  Questionable sepsis. Evaluate for abnormality. Former  smoker. EXAM: PORTABLE CHEST 1 VIEW COMPARISON:  AP chest 01/18/2022 chest two views 12/31/2021, CT chest 12/31/2021, AP chest 04/04/2021 FINDINGS: Right chest wall porta catheter tip again overlies the central superior vena cava. Cardiac silhouette and mediastinal contours are within normal limits. Right upper lobe mass is unchanged from 01/18/2022 most recent chest radiographs but likely slightly decreased from 04/04/2021. There is a new left perihilar density measuring approximately 8 cm in craniocaudal dimension, not seen on recent 01/18/2022 radiographs and therefore possibly representing an  acute findings such as pneumonia. A smaller left lower lung nodular density appears grossly unchanged from prior radiographs and CT. No pleural effusion or pneumothorax. IMPRESSION: Stable known right upper lobe mass. Note is made on recent chest CT of multiple new pulmonary nodules concerning for pulmonary metastatic disease. New large left perihilar density compared to recent 01/18/2022 radiographs only 20 days ago. Given the acute rapid change, this may represent pneumonia however remains concerning for metastatic disease. Electronically Signed: By: Yvonne Kendall M.D. On: 02/16/2022 16:36   DG Chest Port 1 View  Result Date: 01/18/2022 CLINICAL DATA:  Weakness. EXAM: PORTABLE CHEST 1 VIEW COMPARISON:  December 31, 2021. FINDINGS: The heart size and mediastinal contours are within normal limits. Right internal jugular Port-A-Cath is unchanged in position. Left lung is clear. Stable right upper lobe mass is noted. The visualized skeletal structures are unremarkable. IMPRESSION: Stable right upper lobe mass is noted.  Left lung is clear. Electronically Signed   By: Marijo Conception M.D.   On: 01/18/2022 11:35   [place] EKG SITE RITE  Result Date: 02/08/2022 If Site Rite image not attached, placement could not be confirmed due to current cardiac rhythm.   Microbiology Recent Results (from the past 240 hour(s))  SARS  Coronavirus 2 by RT PCR (hospital order, performed in [facility] hospital lab) *cepheid single result test* Anterior Nasal Swab     Status: None   Collection Time: 02/23/2022  4:11 PM   Specimen: Anterior Nasal Swab  Result Value Ref Range Status   SARS Coronavirus 2 by RT PCR NEGATIVE NEGATIVE Final    Comment: (NOTE) SARS-CoV-2 target nucleic acids are NOT DETECTED.  The SARS-CoV-2 RNA is generally detectable in upper and lower respiratory specimens during the acute phase of infection. The lowest concentration of SARS-CoV-2 viral copies this assay can detect is 250 copies / mL. A negative result does not preclude SARS-CoV-2 infection and should not be used as the sole basis for treatment or other patient management decisions.  A negative result may occur with improper specimen collection / handling, submission of specimen other than nasopharyngeal swab, presence of viral mutation(s) within the areas targeted by this assay, and inadequate number of viral copies (<250 copies / mL). A negative result must be combined with clinical observations, patient history, and epidemiological information.  Fact Sheet for Patients:   https://www.patel.info/  Fact Sheet for Healthcare Providers: https://hall.com/  This test is not yet approved or  cleared by the [place] FDA and has been authorized for detection and/or diagnosis of SARS-CoV-2 by FDA under an Emergency Use Authorization (EUA).  This EUA will remain in effect (meaning this test can be used) for the duration of the COVID-19 declaration under Section 564(b)(1) of the Act, 21 U.S.C. section 360bbb-3(b)(1), unless the authorization is terminated or revoked sooner.  Performed at [facility], [address], [place], [place] [address]   Blood Culture (routine x 2)     Status: Abnormal   Collection Time: 03/06/2022  4:33 PM   Specimen: BLOOD  Result Value Ref Range Status    Specimen Description   Final    BLOOD LAC Performed at [facility], [address], [place], [place] [address]    Special Requests   Final    BOTTLES DRAWN AEROBIC AND ANAEROBIC BCLV Performed at [facility], [address] [place], [place] [address]    Culture  Setup Time   Final    GRAM NEGATIVE RODS IN BOTH  AEROBIC AND ANAEROBIC BOTTLES CRITICAL RESULT CALLED TO, READ BACK BY AND VERIFIED WITH: NATHAN BELUE AT 7517 02/08/22.PMF Performed at [facility], [address] [place], [place] [address]    Culture (A)  Final    ESCHERICHIA COLI SUSCEPTIBILITIES PERFORMED ON PREVIOUS CULTURE WITHIN THE LAST 5 DAYS. Performed at [facility], [place] [address], [place], [place] [address]    Report Status 02/10/2022 FINAL  Final  Blood Culture (routine x 2)     Status: Abnormal   Collection Time: 02/10/2022  4:33 PM   Specimen: BLOOD  Result Value Ref Range Status   Specimen Description   Final    BLOOD LH Performed at [facility], [address] [place], [place] [address]    Special Requests   Final    BOTTLES DRAWN AEROBIC AND ANAEROBIC BCLV Performed at [facility], [address], [place], [place] [address]    Culture  Setup Time   Final    GRAM NEGATIVE RODS IN BOTH AEROBIC AND ANAEROBIC BOTTLES CRITICAL RESULT CALLED TO, READ BACK BY AND VERIFIED WITH: NATHAN BELUE AT 5993 02/08/22.PMF Performed at [facility], [place] [address], [place], [place] [address]    Culture ESCHERICHIA COLI (A)  Final   Report Status 02/10/2022 FINAL  Final   Organism ID, Bacteria ESCHERICHIA COLI  Final      Susceptibility   Escherichia coli - MIC*    AMPICILLIN >=32 RESISTANT Resistant     CEFAZOLIN 16 SENSITIVE Sensitive     CEFEPIME <=0.12 SENSITIVE Sensitive     CEFTAZIDIME <=1 SENSITIVE Sensitive     CEFTRIAXONE <=0.25 SENSITIVE Sensitive     CIPROFLOXACIN <=0.25 SENSITIVE Sensitive     GENTAMICIN <=1 SENSITIVE Sensitive      IMIPENEM <=0.25 SENSITIVE Sensitive     TRIMETH/SULFA <=20 SENSITIVE Sensitive     AMPICILLIN/SULBACTAM >=32 RESISTANT Resistant     PIP/TAZO <=4 SENSITIVE Sensitive     * ESCHERICHIA COLI  Blood Culture ID Panel (Reflexed)     Status: Abnormal   Collection Time: 03/07/2022  4:33 PM  Result Value Ref Range Status   Enterococcus faecalis NOT DETECTED NOT DETECTED Final   Enterococcus Faecium NOT DETECTED NOT DETECTED Final   Listeria monocytogenes NOT DETECTED NOT DETECTED Final   Staphylococcus species NOT DETECTED NOT DETECTED Final   Staphylococcus aureus (BCID) NOT DETECTED NOT DETECTED Final   Staphylococcus epidermidis NOT DETECTED NOT DETECTED Final   Staphylococcus lugdunensis NOT DETECTED NOT DETECTED Final   Streptococcus species NOT DETECTED NOT DETECTED Final   Streptococcus agalactiae NOT DETECTED NOT DETECTED Final   Streptococcus pneumoniae NOT DETECTED NOT DETECTED Final   Streptococcus pyogenes NOT DETECTED NOT DETECTED Final   A.calcoaceticus-baumannii NOT DETECTED NOT DETECTED Final   Bacteroides fragilis NOT DETECTED NOT DETECTED Final   Enterobacterales DETECTED (A) NOT DETECTED Final    Comment: Enterobacterales represent a large order of gram negative bacteria, not a single organism. CRITICAL RESULT CALLED TO, READ BACK BY AND VERIFIED WITH: NATHAN BELUE AT 7939 02/08/22.PMF    Enterobacter cloacae complex NOT DETECTED NOT DETECTED Final   Escherichia coli DETECTED (A) NOT DETECTED Final    Comment: CRITICAL RESULT CALLED TO, READ BACK BY AND VERIFIED WITH: NATHAN BELUE AT 0300 02/08/22.PMF    Klebsiella aerogenes NOT DETECTED NOT DETECTED Final   Klebsiella oxytoca NOT DETECTED NOT DETECTED Final   Klebsiella pneumoniae NOT DETECTED NOT DETECTED Final   Proteus species NOT DETECTED NOT DETECTED Final  Salmonella species NOT DETECTED NOT DETECTED Final   Serratia marcescens NOT DETECTED NOT DETECTED Final   Haemophilus influenzae NOT DETECTED NOT DETECTED Final    Neisseria meningitidis NOT DETECTED NOT DETECTED Final   Pseudomonas aeruginosa NOT DETECTED NOT DETECTED Final   Stenotrophomonas maltophilia NOT DETECTED NOT DETECTED Final   Candida albicans NOT DETECTED NOT DETECTED Final   Candida auris NOT DETECTED NOT DETECTED Final   Candida glabrata NOT DETECTED NOT DETECTED Final   Candida krusei NOT DETECTED NOT DETECTED Final   Candida parapsilosis NOT DETECTED NOT DETECTED Final   Candida tropicalis NOT DETECTED NOT DETECTED Final   Cryptococcus neoformans/gattii NOT DETECTED NOT DETECTED Final   CTX-M ESBL NOT DETECTED NOT DETECTED Final   Carbapenem resistance IMP NOT DETECTED NOT DETECTED Final   Carbapenem resistance KPC NOT DETECTED NOT DETECTED Final   Carbapenem resistance NDM NOT DETECTED NOT DETECTED Final   Carbapenem resist OXA 48 LIKE NOT DETECTED NOT DETECTED Final   Carbapenem resistance VIM NOT DETECTED NOT DETECTED Final    Comment: Performed at [facility], [address] [place], [place] [address]  Aerobic/Anaerobic Culture w Gram Stain (surgical/deep wound)     Status: None (Preliminary result)   Collection Time: 02/21/2022  7:50 PM   Specimen: PATH Other; Wound  Result Value Ref Range Status   Specimen Description   Final    WOUND ABDOMINAL Performed at [facility], [address] [place], [place] [address]    Special Requests   Final    NONE Performed at [facility], [place] [place], [place] [address]    Gram Stain   Final    NO SQUAMOUS EPITHELIAL CELLS SEEN FEW WBC SEEN FEW GRAM POSITIVE RODS FEW GRAM POSITIVE COCCI MODERATE GRAM NEGATIVE RODS    Culture   Final    FEW ESCHERICHIA COLI FEW STREPTOCOCCUS ANGINOSIS HOLDING FOR POSSIBLE ANAEROBE Performed at [facility], [place] [address] [place], [place] [address]    Report Status PENDING  Incomplete   Organism ID, Bacteria ESCHERICHIA COLI  Final   Organism ID, Bacteria STREPTOCOCCUS ANGINOSIS  Final       Susceptibility   Escherichia coli - MIC*    AMPICILLIN >=32 RESISTANT Resistant     CEFAZOLIN <=4 SENSITIVE Sensitive     CEFEPIME <=0.12 SENSITIVE Sensitive     CEFTAZIDIME <=1 SENSITIVE Sensitive     CEFTRIAXONE <=0.25 SENSITIVE Sensitive     CIPROFLOXACIN <=0.25 SENSITIVE Sensitive     GENTAMICIN <=1 SENSITIVE Sensitive     IMIPENEM <=0.25 SENSITIVE Sensitive     TRIMETH/SULFA <=20 SENSITIVE Sensitive     AMPICILLIN/SULBACTAM 16 INTERMEDIATE Intermediate     PIP/TAZO <=4 SENSITIVE Sensitive     * FEW ESCHERICHIA COLI   Streptococcus anginosis - MIC*    PENICILLIN 0.12 SENSITIVE Sensitive     CEFTRIAXONE 0.25 SENSITIVE Sensitive     ERYTHROMYCIN <=0.12 SENSITIVE Sensitive     LEVOFLOXACIN <=0.25 SENSITIVE Sensitive     * FEW STREPTOCOCCUS ANGINOSIS  MRSA Next Gen by PCR, Nasal     Status: None   Collection Time: 02/09/2022 11:26 PM   Specimen: Nasal Mucosa; Nasal Swab  Result Value Ref Range Status   MRSA by PCR Next Gen NOT DETECTED NOT DETECTED Final    Comment: (NOTE) The GeneXpert MRSA Assay (FDA approved for NASAL specimens only), is one component of a comprehensive MRSA colonization surveillance program. It is not intended to diagnose MRSA infection nor to  guide or monitor treatment for MRSA infections. Test performance is not FDA approved in patients less than 50 years old. Performed at [facility], [address] [place], [place] [address]     Lab Basic Metabolic Panel: Recent Labs  Lab 02/08/22 1021 02/09/22 0415 02/09/22 0457 02/09/22 1325 02/09/22 1415 02/10/22 0418 02/10/22 1504 02/10/22 2102 2022-03-05 0014  NA 139 135 137 134*  --  139  --  143  --   K 3.5 2.3* 2.3* 7.1* 4.1 3.3* 4.1 6.7* 5.6*  CL 103 99 99 104  --  102  --  103  --   CO2 _0 --  29  --  21*  --   GLUCOSE 162* 102* 96 123*  --  107*  --  58*  --   BUN 21* _1 --  8  --  10  --   CREATININE 0.38* 0.34* 0.33* 0.33*  --  0.39*  --  0.80  --   CALCIUM 8.0* 7.9*  7.9* 7.5*  --  7.6*  --  7.1*  --   MG 2.7* 2.2 2.2  --   --  2.7*  --  2.7*  --   PHOS  --  2.3* 2.3* 2.5  --  2.1* 2.6 7.2*  --    Liver Function Tests: Recent Labs  Lab 03/03/2022 1610 02/14/2022 2326 02/08/22 0410 02/09/22 0415 02/10/22 0418  AST 56* 55* 40  --   --   ALT 61* 47* 40  --   --   ALKPHOS 122 78 50  --   --   BILITOT 1.0 0.7 0.8  --   --   PROT 6.0* 4.3* 4.0*  --   --   ALBUMIN 2.1* <1.5* 1.9* 2.6* 2.4*   No results for input(s): LIPASE, AMYLASE in the last 168 hours. No results for input(s): AMMONIA in the last 168 hours. CBC: Recent Labs  Lab 02/15/2022 1347 03/08/2022 1610 02/25/2022 2326 02/08/22 0410 02/09/22 0415 02/09/22 0457 02/09/22 1121 02/09/22 1611 02/09/22 2212 02/10/22 0418 02/10/22 2102  WBC 12.0* 9.0 9.3 9.2 7.6 7.3  --   --   --  5.8 14.0*  NEUTROABS 9.3* 6.9 7.1  --   --   --   --   --   --  4.6  --   HGB 14.6 14.7 13.1 11.6* 8.4* 7.8* 7.6* 8.0* 8.0* 7.9* 6.0*  HCT 41.3 43.6 39.8 34.2* 24.7* 22.9* 22.7* 23.7* 23.6* 24.1* 19.6*  MCV 92.8 94.6 96.6 94.5 94.6 93.9  --   --   --  95.3 103.2*  PLT 95* 94* 76* 74* 35* 32*  --   --   --  30* 30*   Cardiac Enzymes: No results for input(s): CKTOTAL, CKMB, CKMBINDEX, TROPONINI in the last 168 hours. Sepsis Labs: Recent Labs  Lab 02/10/2022 2326 03/08/2022 2345 02/08/22 0410 02/08/22 1021 02/09/22 0415 02/09/22 0457 02/10/22 0418 02/10/22 2102 03-05-2022 0014  PROCALCITON 2.74  --  3.99  --  3.37  --   --   --   --   WBC 9.3  --  9.2  --  7.6 7.3 5.8 14.0*  --   LATICACIDVEN  --    < > 2.3* 2.9* 0.9  --   --  >9.0* >9.0*   < > = values in this interval not displayed.    Procedures/Operations  02/16/2022: Exploratory laparotomy/Hartman's procedure & open left salpingectomy 02/21/2022: ETT placement 02/08/2022: PICC  placement 02/10/2022: ETT placement   Stetson Pelaez L Rust-Chester 2022-03-10, 1:18 AM  Domingo Pulse Rust-Chester, AGACNP-BC Acute Care Nurse Practitioner [facility]    [phone] / [phone] Please see Amion for pager details.

## 2022-03-09 DEATH — deceased

## 2022-03-19 ENCOUNTER — Encounter: Payer: Medicaid Other | Admitting: Physical Therapy

## 2022-03-20 ENCOUNTER — Other Ambulatory Visit: Payer: Medicaid Other

## 2022-03-26 ENCOUNTER — Encounter: Payer: Medicaid Other | Admitting: Physical Therapy

## 2022-04-03 ENCOUNTER — Ambulatory Visit: Payer: Medicaid Other | Admitting: Radiation Oncology

## 2022-04-29 ENCOUNTER — Ambulatory Visit: Payer: Medicaid Other | Admitting: Physician Assistant

## 2023-05-15 IMAGING — US US RENAL
1 series · 14 of 25 positions shown · non-contrast
Comparison: CT 03/27/2021

CLINICAL DATA: UTI

EXAM:
RENAL / URINARY TRACT ULTRASOUND COMPLETE

[Series 1: us renal · 14 of 28 slices shown]
[im 1/28]
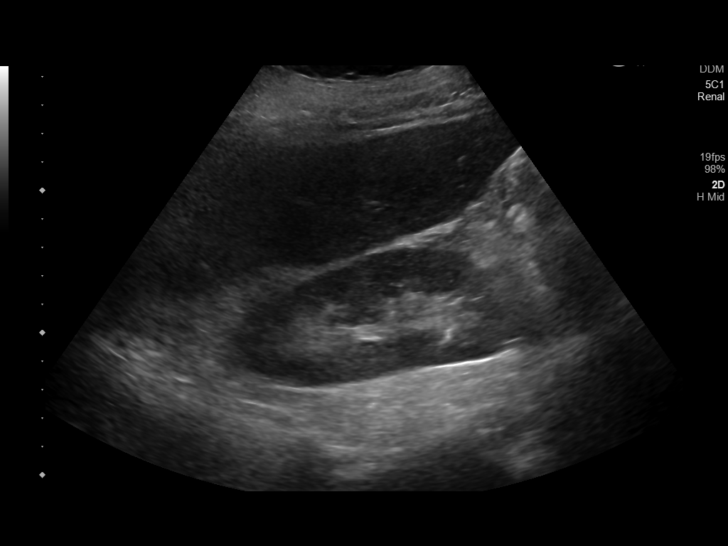
[im 3/28]
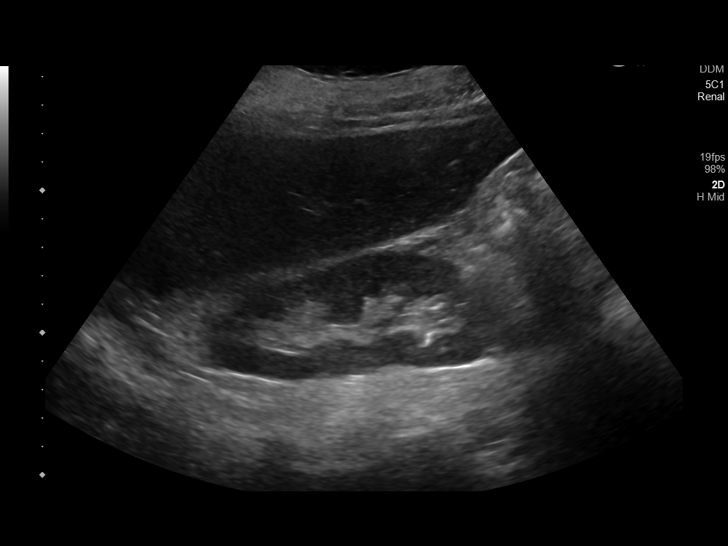
[im 5/28]
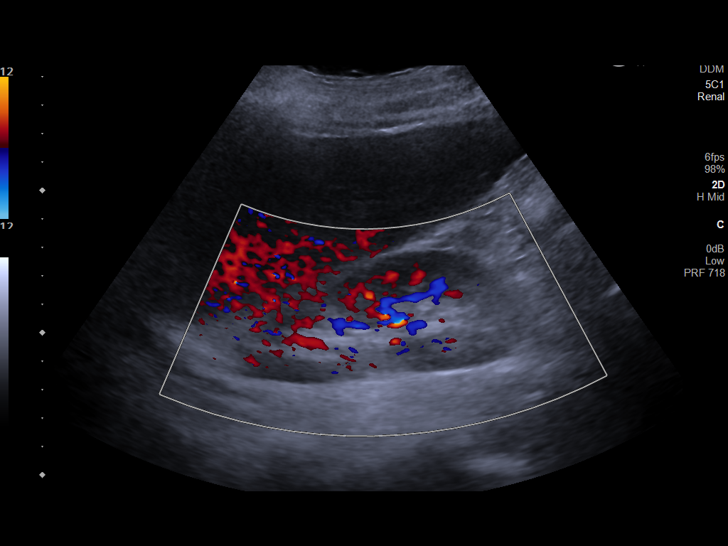
[im 7/28]
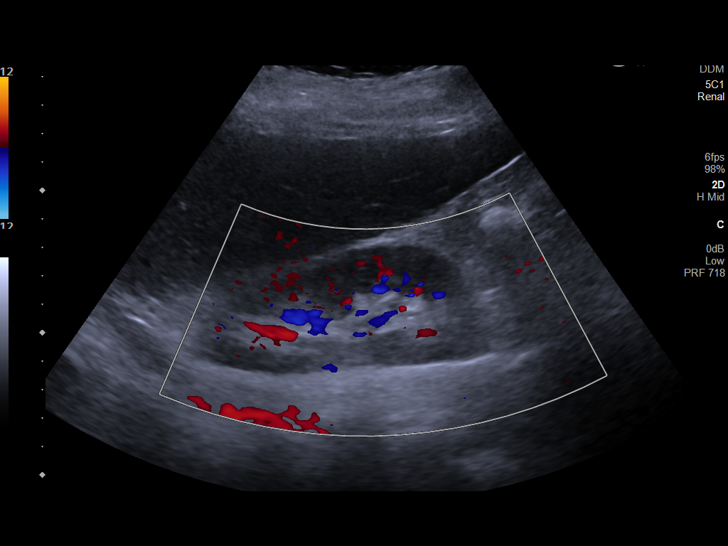
[im 10/28]
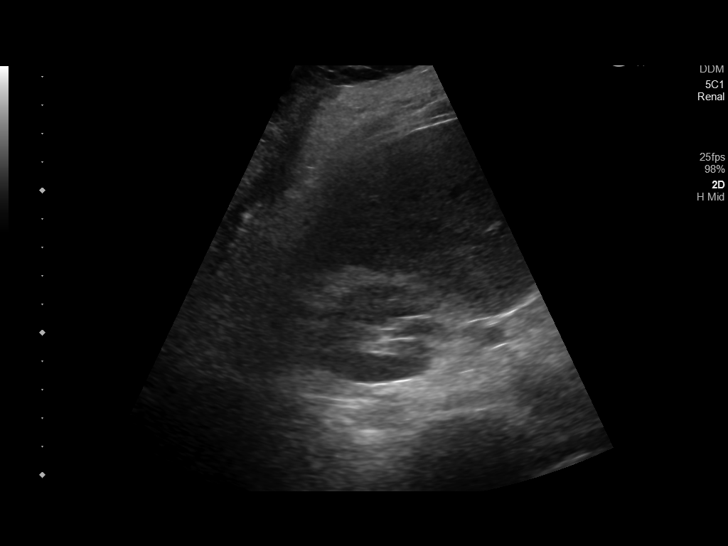
[im 11/28]
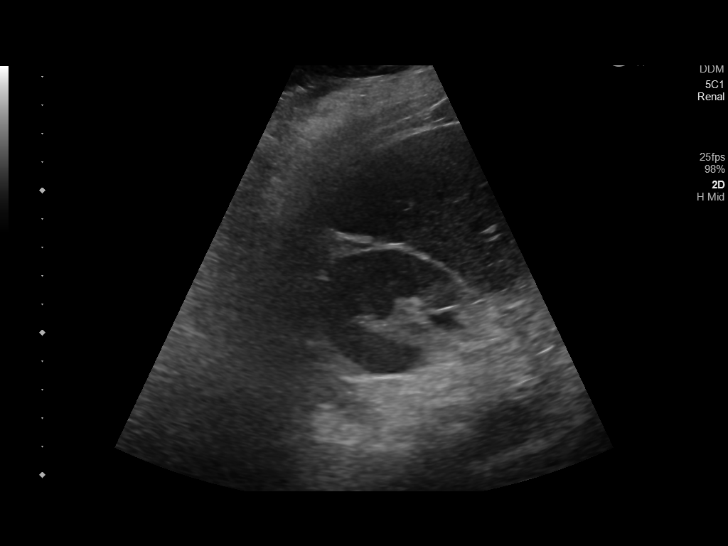
[im 13/28]
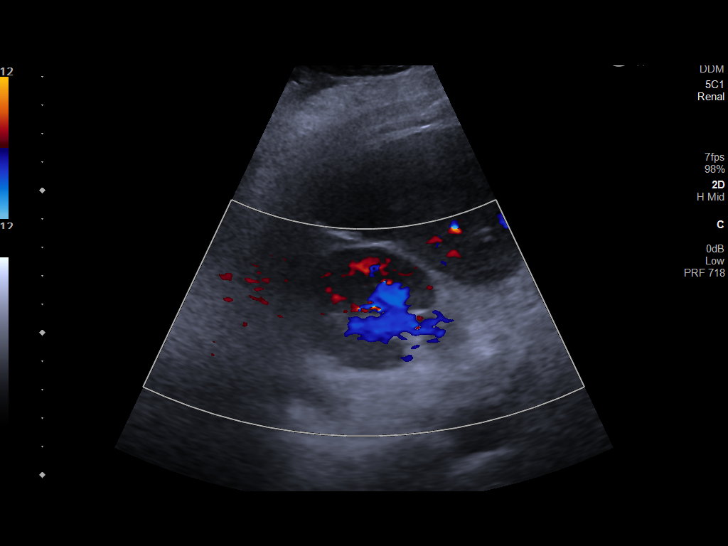
[im 15/28]
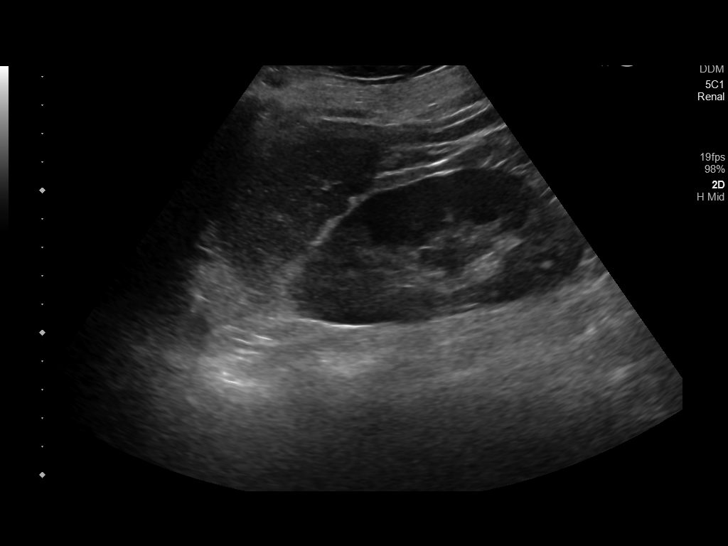
[im 17/28]
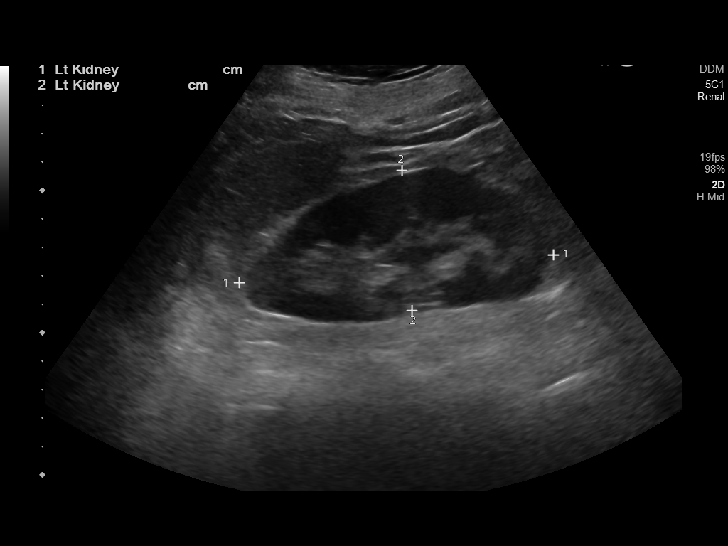
[im 19/28]
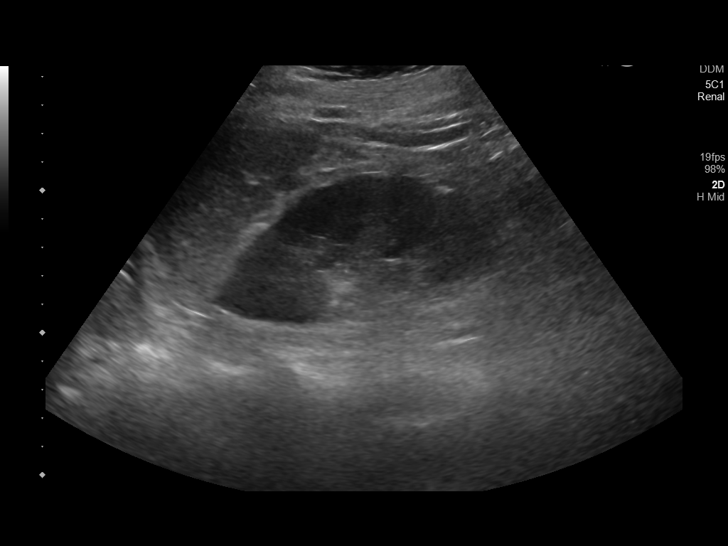
[im 21/28]
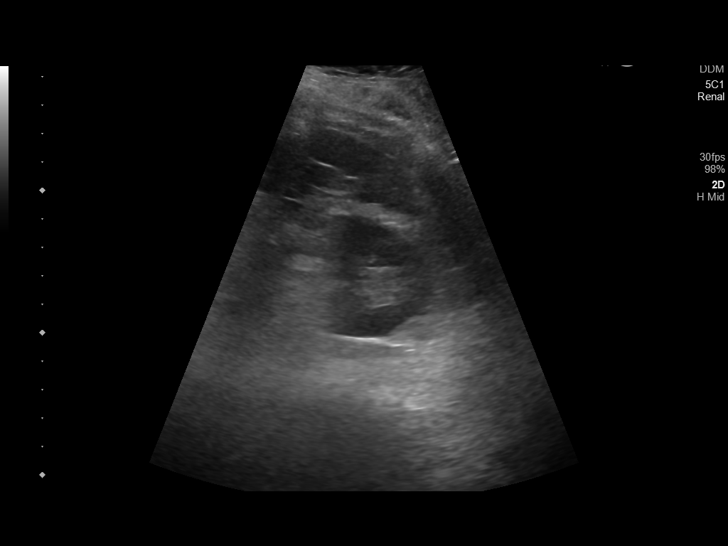
[im 23/28]
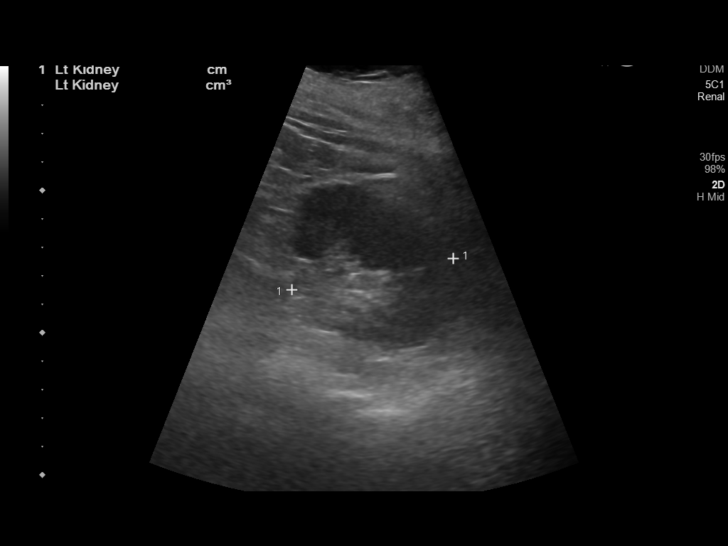
[im 25/28]
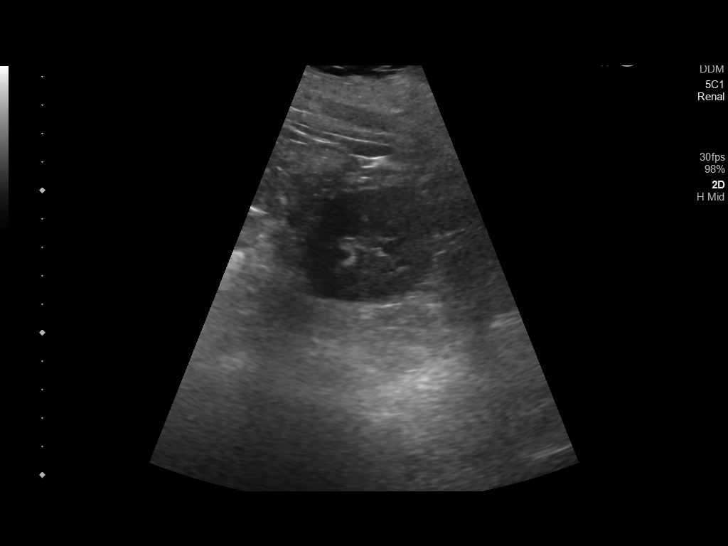
[im 28/28]
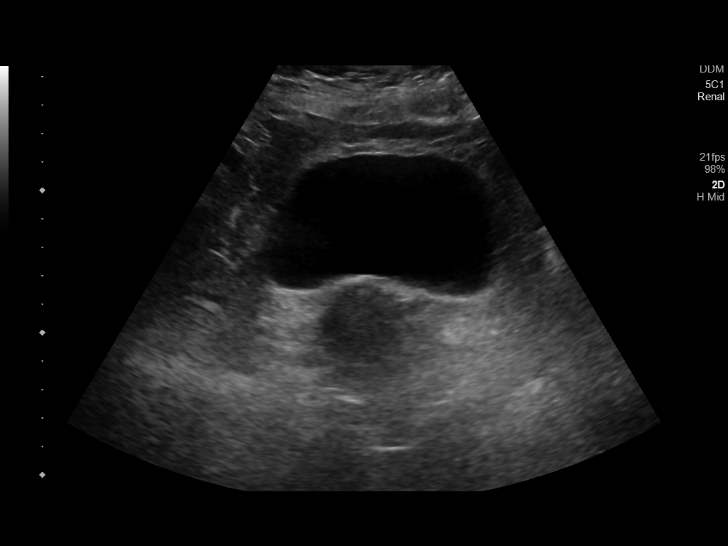

[14 of 25 positions shown; findings below may reference images not displayed]

FINDINGS: Right Kidney:

Renal measurements: 10.9 x 4 x 4.4 cm = volume: 101 mL. Echogenicity
within normal limits. No mass or hydronephrosis visualized.

Left Kidney:

Renal measurements: 11.1 x 4.9 x 5.8 cm = volume: 165 mL.
Echogenicity within normal limits. No mass or hydronephrosis
visualized.

Bladder:

Appears normal for degree of bladder distention.

Other:

None.
IMPRESSION: Negative renal ultrasound

## 2023-05-17 IMAGING — DX DG CHEST 1V PORT
1 series · 1 of 1 positions shown · non-contrast
Comparison: 03/27/2021

CLINICAL DATA: Shortness of breath and chest pain

EXAM:
PORTABLE CHEST 1 VIEW

[chest ap]
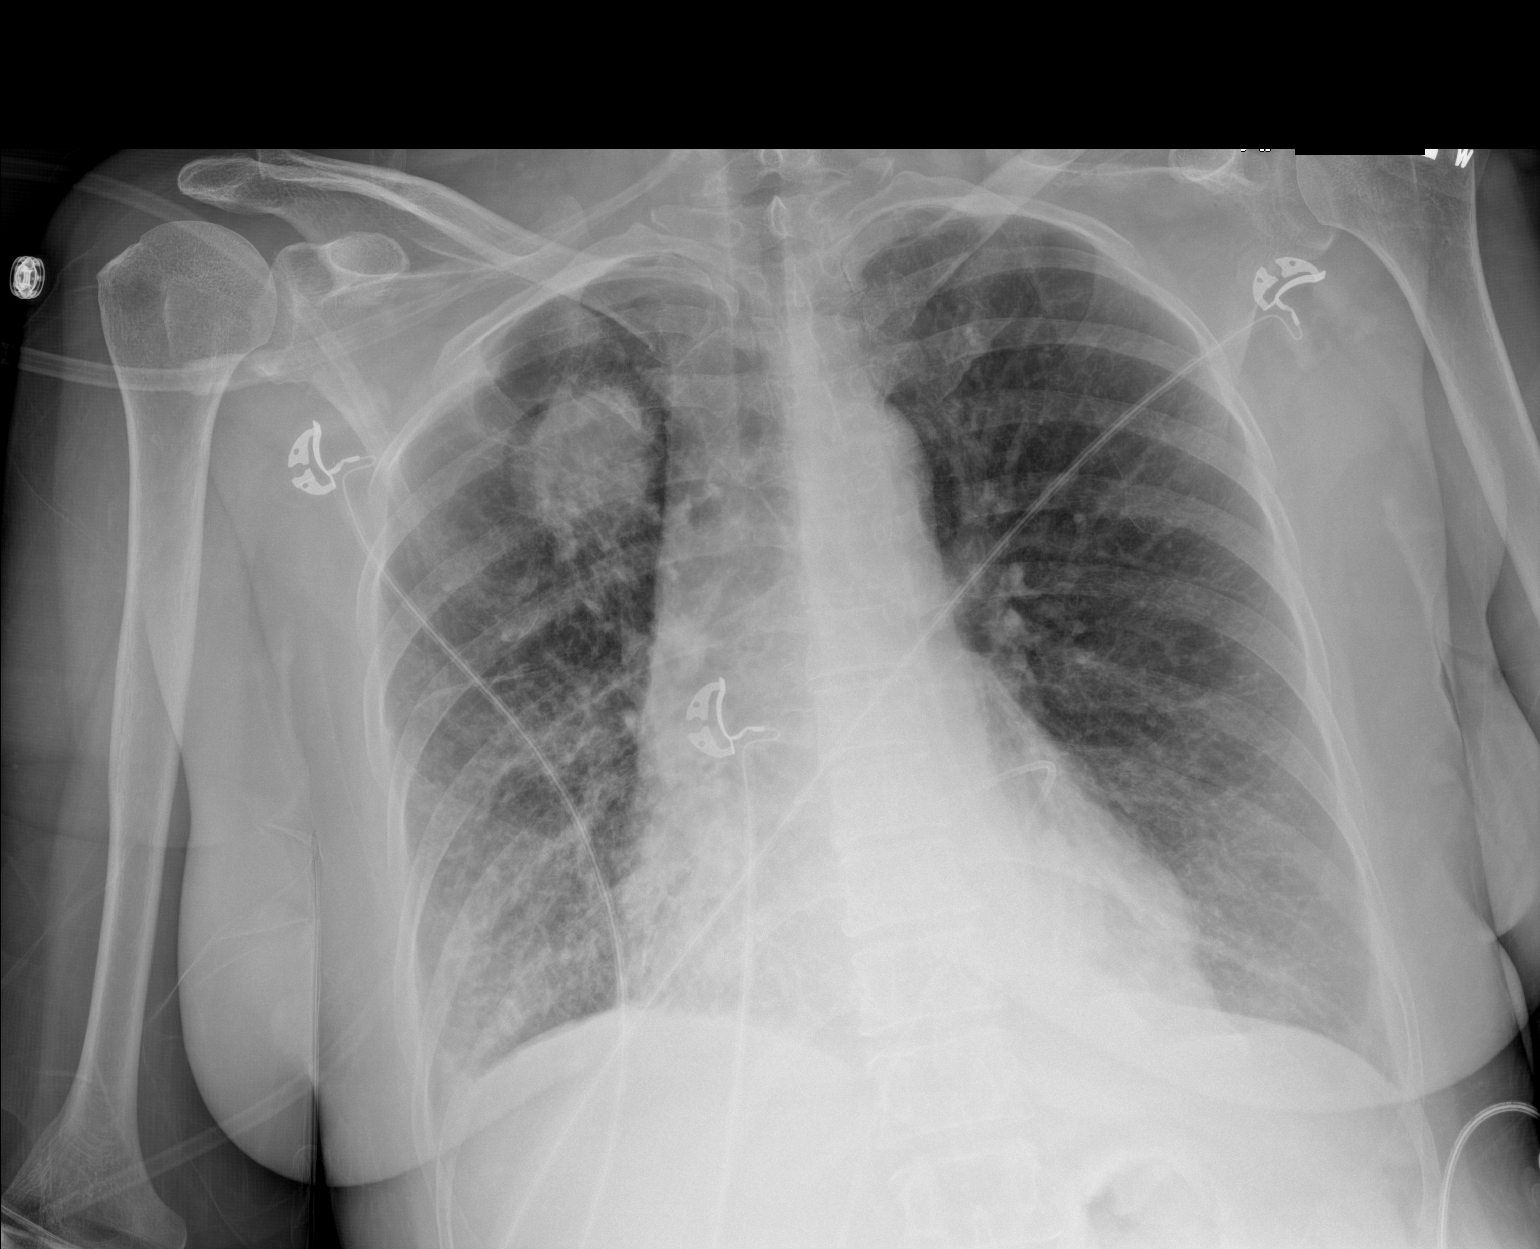

[1 of 1 positions shown; findings below may reference images not displayed]

FINDINGS: Cardiac shadow is mildly enlarged but stable. Spiculated right upper
lobe mass lesion is again noted. Mild increase in parenchymal edema
is seen. Small bore catheter is again noted over the cardiac shadow
likely related to a pericardial drain. No pneumothorax is noted.
IMPRESSION: Increasing edema particularly on the right.

Stable right upper lobe mass lesion.

No new focal abnormality is noted.

## 2023-05-18 IMAGING — DX DG CHEST 1V
1 series · 1 of 1 positions shown · non-contrast
Comparison: 03/29/2021

CLINICAL DATA: Possible pneumothorax

EXAM:
CHEST  1 VIEW

[chest ap]
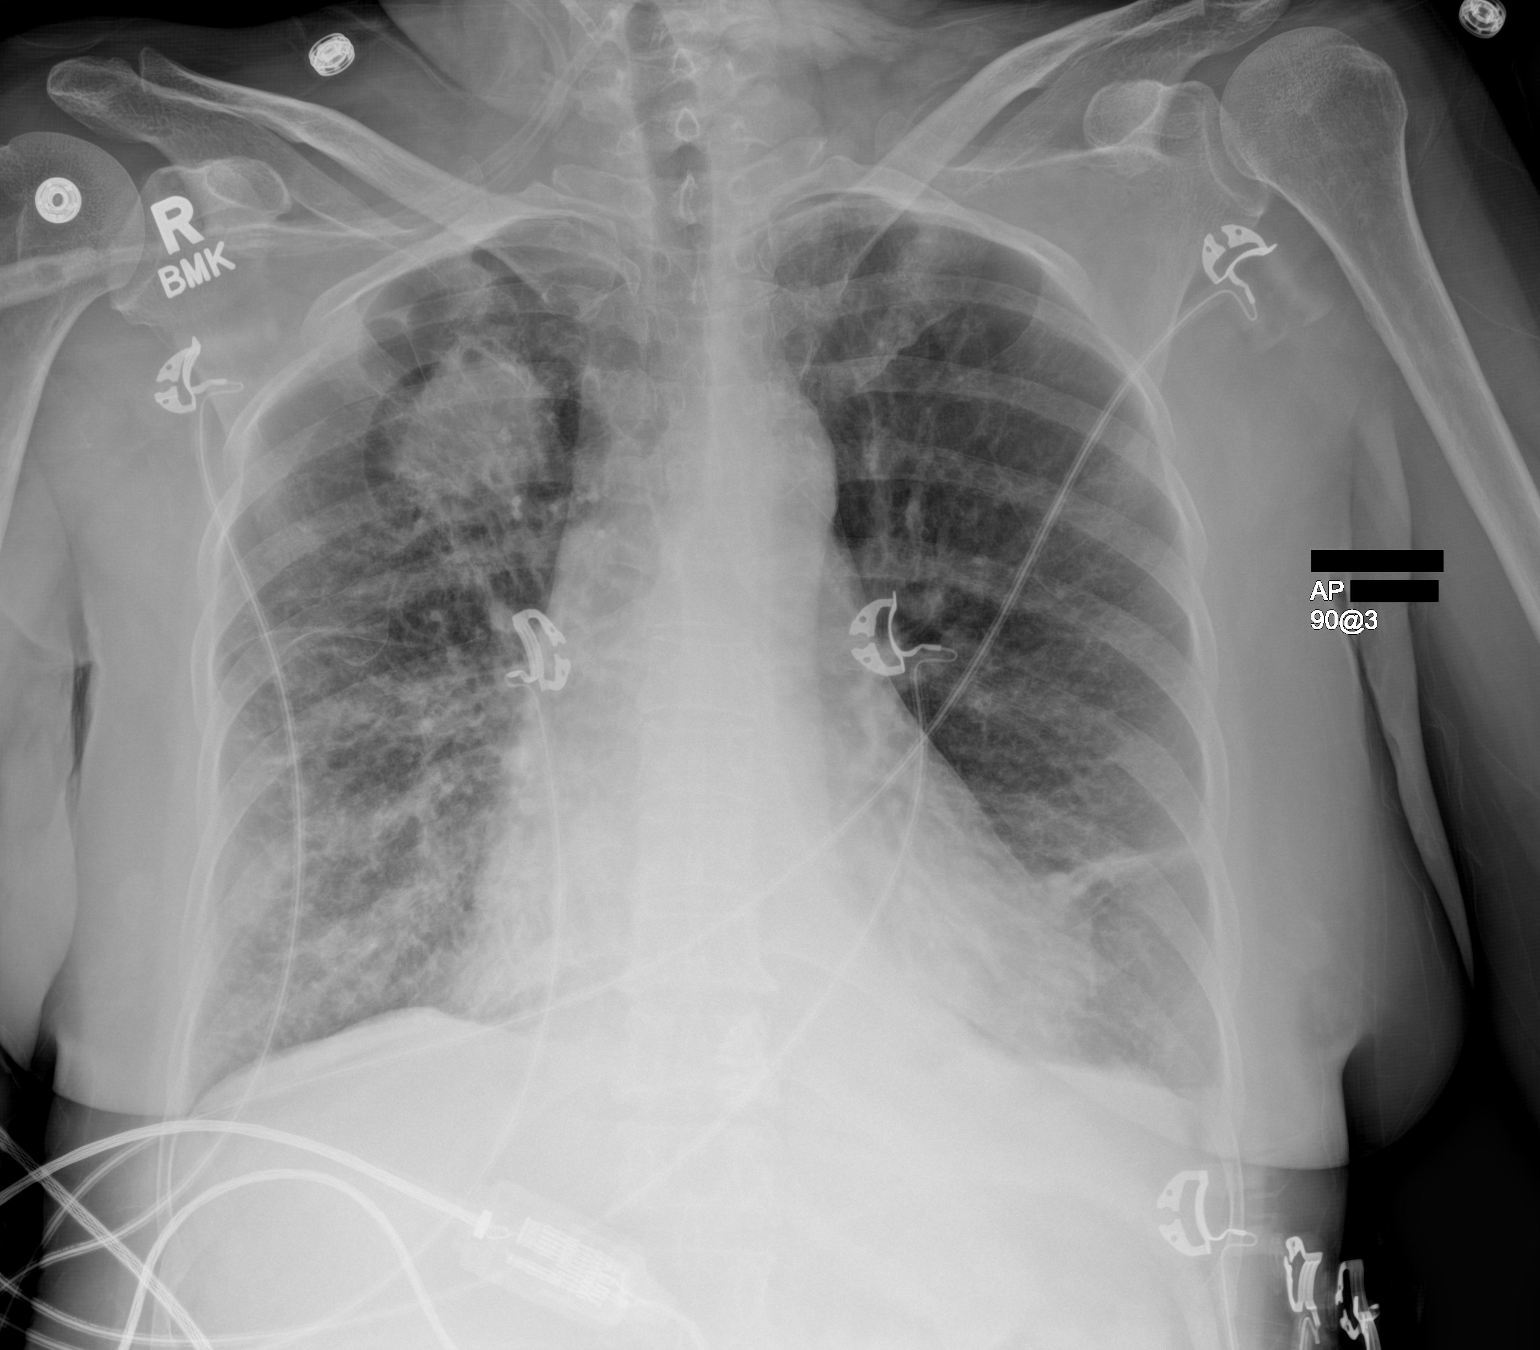

[1 of 1 positions shown; findings below may reference images not displayed]

FINDINGS: Spiculated right upper lobe lung mass with relative hyperlucency in
the vicinity but without change, prior chest CT did not demonstrate
corresponding abnormality other than emphysema. No discrete pleural
line is seen. Small left pleural effusion. Mild cardiomegaly with
increasing vascular congestion and pulmonary edema. Subsegmental
atelectasis at the left lung base. Drainage catheter appears
removed.
IMPRESSION: 1. Mild cardiomegaly with increasing vascular congestion/mild
pulmonary edema and small left effusion
2. Spiculated right upper lobe lung mass. Asymmetrical hyperlucency
surrounding the mass without change and no definitive pleural line
to suggest pneumothorax.

## 2023-05-20 IMAGING — DX DG CHEST 1V PORT
1 series · 1 of 1 positions shown · non-contrast
Comparison: CT 03/27/2021, radiograph 03/30/2021

CLINICAL DATA: Shortness of breath

EXAM:
PORTABLE CHEST 1 VIEW

[chest ap]
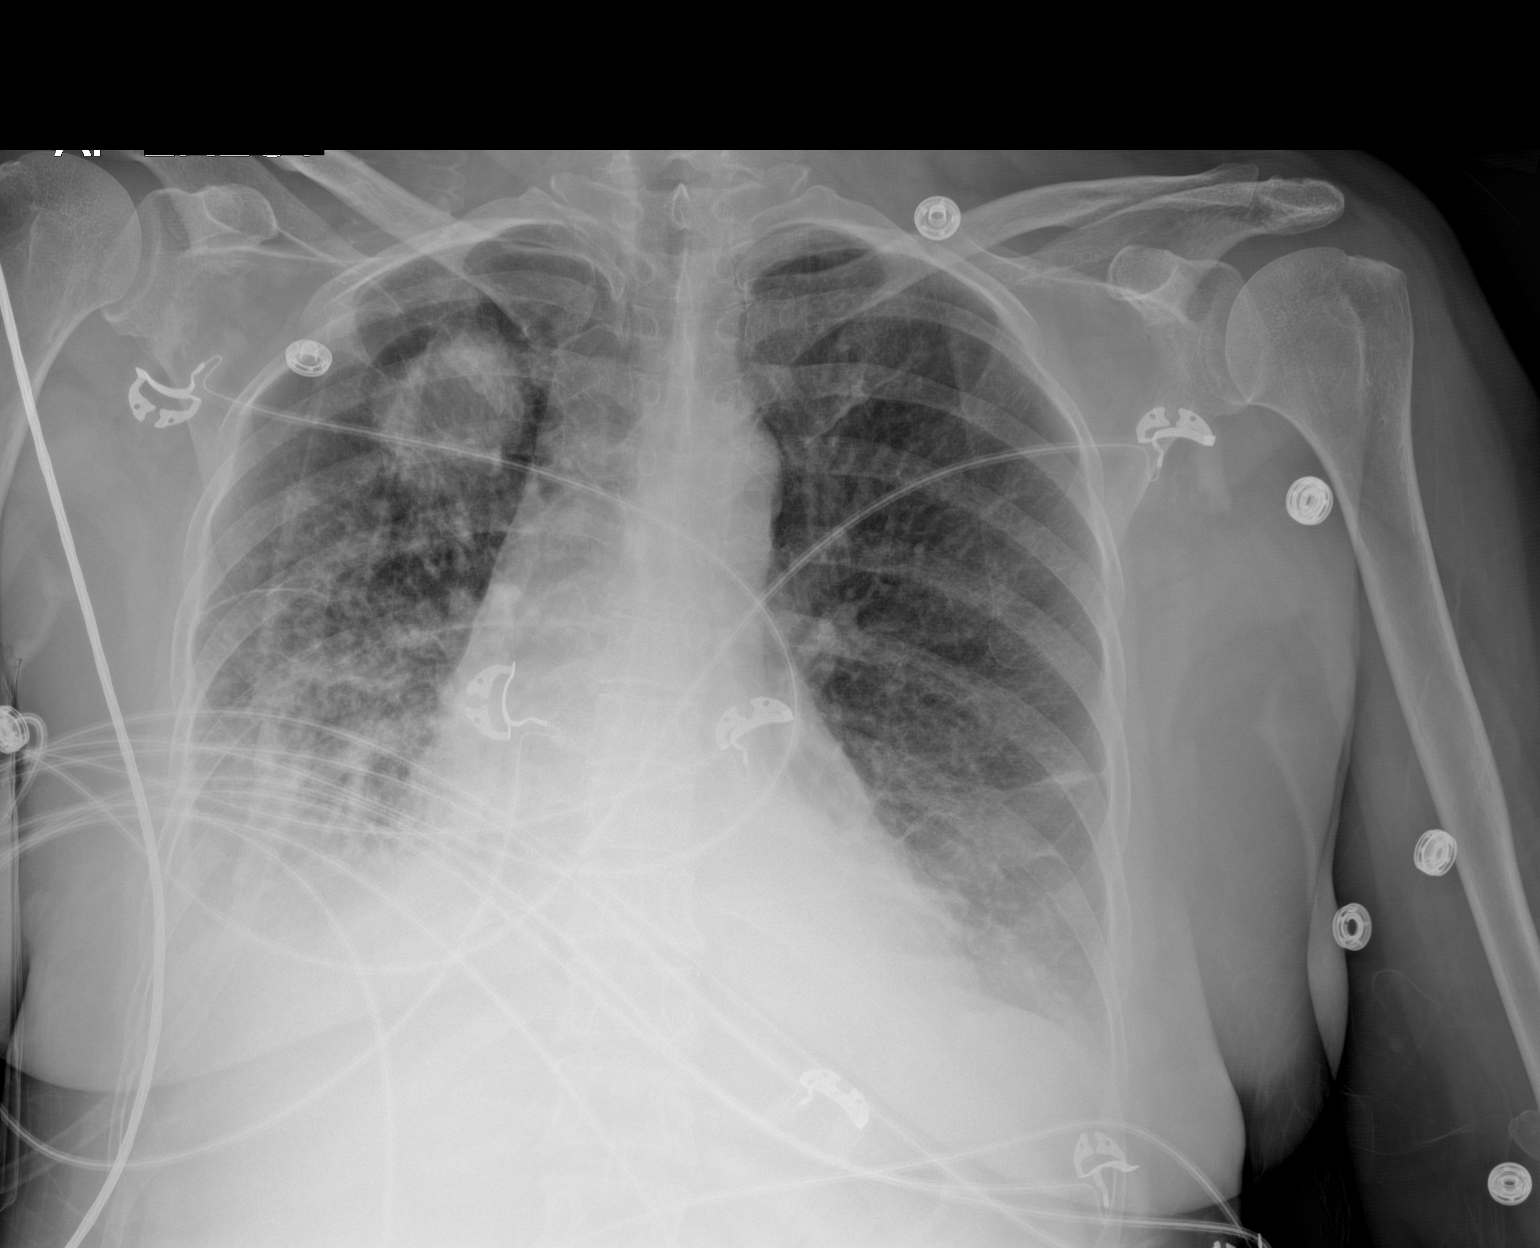

[1 of 1 positions shown; findings below may reference images not displayed]

FINDINGS: Diminishing lung volumes with increasing veiling opacity in the lung
bases, right greater than left which could reflect developing
pleural effusions. Pulmonary vascularity is indistinct.
Cardiomediastinal silhouette is stable from prior with known large
pericardial effusion. Redemonstration of a large rounded masslike
opacity in the right upper lung corresponding to the known right
upper lung malignancy. No acute or worrisome osseous or soft tissue
abnormality of the chest wall.
IMPRESSION: Diminishing lung volumes.

Increasing veiling opacity may reflect developing pleural effusions.
Additional opacity in the bases may reflect some passive atelectatic
change or underlying airspace opacity.

Diffusely worsening interstitial opacity, concerning for developing
edema as well given pulmonary vascular congestion. Underlying
lymphangitic spread is not fully excluded.

Redemonstrated masslike opacity in the right upper lobe.

## 2023-05-23 IMAGING — DX DG CHEST 1V PORT
1 series · 1 of 1 positions shown · non-contrast
Comparison: 04/01/2021.  CT 03/27/2021.

CLINICAL DATA: Respiratory failure.

EXAM:
PORTABLE CHEST 1 VIEW

[chest ap]
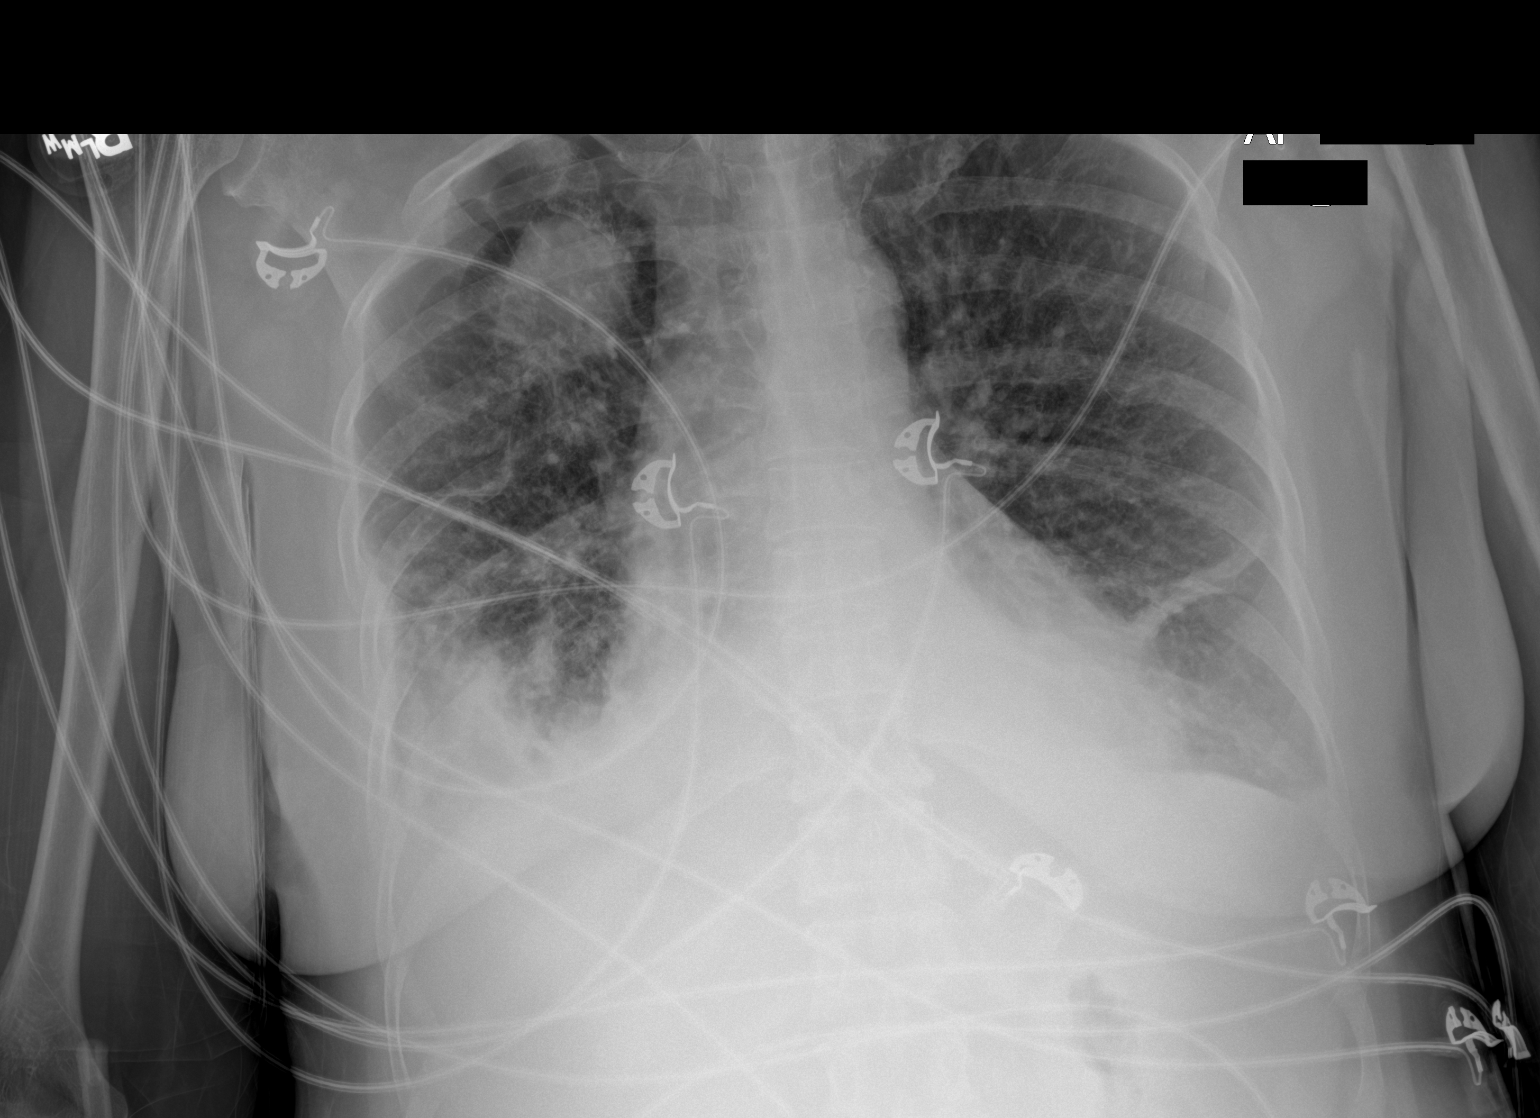

[1 of 1 positions shown; findings below may reference images not displayed]

FINDINGS: Mediastinum and hilar structures are stable. Borderline
cardiomegaly. No pulmonary venous congestion. Persistent large mass
right upper lung again noted. Diffuse bilateral interstitial
prominence again noted. Although interstitial edema and/or
pneumonitis could present this fashion, lymphangitic tumor spread
again cannot be excluded. Bibasilar atelectasis. Right base
infiltrate. Right base pneumonia cannot be excluded. Small bilateral
pleural effusions again noted. No acute bony abnormality identified.
IMPRESSION: 1.  Large right upper lung mass again noted without interim change.

2. Diffuse bilateral interstitial prominence again noted. Although
interstitial edema and/or pneumonitis could present in this fashion,
lymphangitic tumor spread again cannot be excluded.

3. Bibasilar atelectasis. Right base infiltrate. Right base
pneumonia cannot be excluded. Small bilateral pleural effusions
again noted.

4.  Borderline cardiomegaly.

## 2023-06-10 ENCOUNTER — Other Ambulatory Visit (HOSPITAL_COMMUNITY): Payer: Self-pay
